# Patient Record
Sex: Female | Born: 1970 | Race: Black or African American | Hispanic: No | Marital: Married | Smoking: Never smoker
Health system: Southern US, Community
[De-identification: ages and names within clinical notes are randomized; demographics above are authoritative.]

## PROBLEM LIST (undated history)

## (undated) VITALS — BP 140/92 | HR 96 | Temp 97.1°F | Resp 16 | Ht <= 58 in | Wt 172.2 lb

## (undated) DIAGNOSIS — I10 Essential (primary) hypertension: Secondary | ICD-10-CM

## (undated) DIAGNOSIS — M722 Plantar fascial fibromatosis: Secondary | ICD-10-CM

## (undated) DIAGNOSIS — F319 Bipolar disorder, unspecified: Secondary | ICD-10-CM

## (undated) DIAGNOSIS — I509 Heart failure, unspecified: Secondary | ICD-10-CM

## (undated) DIAGNOSIS — M6282 Rhabdomyolysis: Secondary | ICD-10-CM

## (undated) DIAGNOSIS — E662 Morbid (severe) obesity with alveolar hypoventilation: Secondary | ICD-10-CM

## (undated) DIAGNOSIS — R918 Other nonspecific abnormal finding of lung field: Secondary | ICD-10-CM

## (undated) DIAGNOSIS — E559 Vitamin D deficiency, unspecified: Secondary | ICD-10-CM

## (undated) DIAGNOSIS — J189 Pneumonia, unspecified organism: Secondary | ICD-10-CM

## (undated) DIAGNOSIS — E785 Hyperlipidemia, unspecified: Secondary | ICD-10-CM

## (undated) DIAGNOSIS — J939 Pneumothorax, unspecified: Secondary | ICD-10-CM

## (undated) DIAGNOSIS — E119 Type 2 diabetes mellitus without complications: Secondary | ICD-10-CM

## (undated) DIAGNOSIS — D649 Anemia, unspecified: Secondary | ICD-10-CM

## (undated) HISTORY — DX: Pneumothorax, unspecified: J93.9

## (undated) HISTORY — PX: CYST EXCISION: SHX5701

## (undated) HISTORY — PX: VENTRICULOPERITONEAL SHUNT: SHX204

## (undated) HISTORY — DX: Morbid (severe) obesity with alveolar hypoventilation: E66.2

## (undated) HISTORY — DX: Heart failure, unspecified: I50.9

## (undated) HISTORY — DX: Other nonspecific abnormal finding of lung field: R91.8

---

## 2006-06-26 ENCOUNTER — Inpatient Hospital Stay (HOSPITAL_COMMUNITY): Admission: EM | Admit: 2006-06-26 | Discharge: 2006-07-06 | Payer: Self-pay | Admitting: Emergency Medicine

## 2007-02-13 ENCOUNTER — Emergency Department (HOSPITAL_COMMUNITY): Admission: EM | Admit: 2007-02-13 | Discharge: 2007-02-13 | Payer: Self-pay | Admitting: Emergency Medicine

## 2007-12-31 ENCOUNTER — Other Ambulatory Visit: Admission: RE | Admit: 2007-12-31 | Discharge: 2007-12-31 | Payer: Self-pay | Admitting: Internal Medicine

## 2009-04-23 ENCOUNTER — Encounter: Admission: RE | Admit: 2009-04-23 | Discharge: 2009-04-23 | Payer: Self-pay | Admitting: Chiropractic Medicine

## 2009-05-28 ENCOUNTER — Ambulatory Visit (HOSPITAL_COMMUNITY): Admission: RE | Admit: 2009-05-28 | Discharge: 2009-05-28 | Payer: Self-pay | Admitting: Chiropractic Medicine

## 2009-09-09 ENCOUNTER — Inpatient Hospital Stay (HOSPITAL_COMMUNITY): Admission: RE | Admit: 2009-09-09 | Discharge: 2009-09-10 | Payer: Self-pay | Admitting: Psychiatry

## 2009-09-09 ENCOUNTER — Emergency Department (HOSPITAL_COMMUNITY): Admission: EM | Admit: 2009-09-09 | Discharge: 2009-09-09 | Payer: Self-pay | Admitting: Family Medicine

## 2009-09-09 ENCOUNTER — Ambulatory Visit: Payer: Self-pay | Admitting: Psychiatry

## 2010-11-03 ENCOUNTER — Other Ambulatory Visit: Admission: RE | Admit: 2010-11-03 | Discharge: 2010-11-03 | Payer: Self-pay | Admitting: Endocrinology

## 2010-11-30 ENCOUNTER — Encounter
Admission: RE | Admit: 2010-11-30 | Discharge: 2010-11-30 | Payer: Self-pay | Source: Home / Self Care | Attending: Internal Medicine | Admitting: Internal Medicine

## 2011-03-24 LAB — COMPREHENSIVE METABOLIC PANEL
ALT: 16 U/L (ref 0–35)
Albumin: 4.1 g/dL (ref 3.5–5.2)
Alkaline Phosphatase: 88 U/L (ref 39–117)
BUN: 11 mg/dL (ref 6–23)
GFR calc Af Amer: >60 mL/min (ref >60)
Potassium: 3.9 mEq/L (ref 3.5–5.1)
Sodium: 130 mEq/L — ABNORMAL LOW (ref 135–145)
Total Protein: 8.2 g/dL (ref 6.0–8.3)

## 2011-03-24 LAB — PREGNANCY, URINE: Preg Test, Ur: NEGATIVE

## 2011-03-24 LAB — CBC
Platelets: 425 10*3/uL — ABNORMAL HIGH (ref 150–400)
RDW: 13.8 % (ref 11.5–15.5)

## 2011-03-24 LAB — URINE DRUGS OF ABUSE SCREEN W ALC, ROUTINE (REF LAB)
Amphetamine Screen, Ur: NEGATIVE
Cocaine Metabolites: NEGATIVE
Creatinine,U: 89.9 mg/dL
Ethyl Alcohol: <10 mg/dL (ref <10)
Phencyclidine (PCP): NEGATIVE

## 2011-03-24 LAB — URINALYSIS, ROUTINE W REFLEX MICROSCOPIC
Ketones, ur: 40 mg/dL — AB
Nitrite: NEGATIVE
Protein, ur: NEGATIVE mg/dL
Urobilinogen, UA: 0.2 mg/dL (ref 0.0–1.0)

## 2011-03-24 LAB — VALPROIC ACID LEVEL: Valproic Acid Lvl: 52.6 ug/mL (ref 50.0–100.0)

## 2011-05-05 NOTE — Consult Note (Signed)
[name], [name] NO.:  [id number]   MEDICAL RECORD NO.:  [id number]          PATIENT TYPE:  INP   LOCATION:  4731                         FACILITY:  [facility]   PHYSICIAN:  Antonietta Breach, M.D.  DATE OF BIRTH:  [birth year]   DATE OF CONSULTATION:  07/02/2006  DATE OF DISCHARGE:                                   CONSULTATION   REQUESTING PHYSICIAN:  Sherin Quarry, MD   REASON FOR CONSULTATION:  Hyperglycemia secondary to psychotropic.  Considering options for treatment.   HISTORY OF PRESENT ILLNESS:  [name] is a 40 year old married female who  was admitted to the [facility] on June 26, 2006.  She was  visiting her sister and is originally from [place].  She was brought into the  emergency room with lethargy and slurred speech.  This had been going on for  about 3 days and she had not been eating well.  She was having confusion and  her mood had been irritable.  She had been urinating a lot and drinking a  lot.  She had also complained of visual blurring and some dizziness.   The patient's hyperglycemia was discovered and has been stabilized.  Since  that time her mood is now stable with normal interests and hope.  She still  has a mild decrease in energy.  She has no thoughts of harming herself.  No  thoughts of harming others.  No delusion.  No hallucinations.  She has no  racing thoughts.  She is on a Abilify 15 mg p.o. night and Lamictal 200 mg  daily without adverse effects other than likely hyperglycemia as a factor  from the Abilify (there is a family history positive for diabetes mellitus).   PAST PSYCHIATRIC HISTORY:  [name] describes 4 day episodes of depression  over several years that would come approximately every 4 months.  They would  involve a mild depression of her energy as well as interest but no  impairment in her ability to take care of herself or perform work.  They did  at times involve a mild increase in sleep and a mild  decrease in appetite.  She does not have any history of suicidal thoughts or suicide attempts.  She  has no history of homicidal thoughts or violence.   The patient has never been tried on any antidepressants.  She did begin a  new job in February 2007 and in May 2007 presented to her medical  professional with a several day history of racing thoughts, pressured  speech, euphoria, decreased need for sleep and ideas of reference from the  TV.  She believed that she was connected to the actors on TV.  She also  believed that she was going to be famous and influential.  The patient was  started on Lamictal.  She returned approximately 6 weeks later and Abilify  was added.  The patient did not require psychiatric admission.  The patient  can also not recall which symptoms continued to be present at the time that  Abilify was added.  However, after Abilify was added  to the regimen she has  continued with the presentation as described after her glucose control in  the history of present illness above.   FAMILY PSYCHIATRIC HISTORY:  None known.   SOCIAL HISTORY:  [name] is from [place].  She is visiting a sister in  [place].  She has 2 children ages 64 and 35.  She has a supportive  marriage.  Her religion is [place].  She states that her church is also  supportive.  She is not working at this time.  She does not use alcohol or  illegal drugs.   GENERAL MEDICAL PROBLEMS:  The patient does have a history of obstructive  sleep apnea and hypertension.   MEDICATIONS:  The patient MAR is reviewed.  Psychotropic's include:  1.  Abilify 15 mg nightly.  2.  Lamictal 200 mg q. day.   ALLERGIES:  There are no known drug allergies.   LABORATORY DATA:  CBC is unremarkable except for slightly decreased  hemoglobin 11.3.  The metabolic panel is remarkable for a glucose at 167  (decreased from 313).  BUN and creatinine are within normal limits.  The  SGOT on the July 10 was 86.  The SGPT 40.    REVIEW OF SYSTEMS:  CONSTITUTIONAL:  Afebrile.  HEAD:  No trauma.  EYES:  No  visual changes.  EARS:  No hearing impairment.  NOSE, throat:  No sore  throat.  NEUROLOGIC:  Unremarkable.  CARDIOVASCULAR:  No chest pain,  palpitations or edema.  RESPIRATORY:  No shortness of breath, coughing or  wheezing.  GASTROINTESTINAL:  No nausea, vomiting or diarrhea.  Symptoms  have resolved.  GENITOURINARY:  No dysuria.  SKIN:  Unremarkable.  MUSCULOSKELETAL:  No deformities, weakness or atrophies.  PSYCHIATRIC:  As  above.  ENDOCRINE/METABOLIC:  As above.   PHYSICAL EXAMINATION:  VITAL SIGNS:  Temperature 97.8, pulse 76,  respirations 18, blood pressure 144/81, O2 saturation on room air 98%.  Current CBG is 313.   MENTAL STATUS EXAM:  [name] is a middle aged female who looks  younger than her stated age who is resting in supine partially reclined  position in her hospital bed with good eye contact and pleasant disposition.  Her fund of knowledge and intelligence are greater than average.  Thought  process:  Logical, coherent and goal directed.  No lucency of associations,  thought content.  No thoughts of harming herself.  No thoughts of harming  others.  No delusions.  No hallucinations.  Memory is intact to immediate,  recent and remote.  Concentration is within normal limits.  Affect is  slightly constricted at baseline with a broad appropriate response.  Insight  is good.  Judgment is intact.  She is oriented completely to all spheres.  Her mood is content.  Her abstraction ability is intact.   ASSESSMENT:  AXIS I:  1.  Mood disorder not otherwise specified; 293.83 (history of functional      bipolar I disorder with recent general medical contribution).  2.  Bipolar I disorder, manic, in remission.  3.  Delirium secondary to hyperglycemic episode in remission.  AXIS II:  None.  AXIS III:  See general medical problem.  AXIS IV:  General medical. AXIS V:  60.   The patient is  not at risk to harm herself or others.  The patient agrees to  call emergency services for thoughts of harming herself, thoughts of harming  others, distress, racing thoughts or unusual perceptions.  INFORMED CONSENT:  The indications, alternatives and adverse affects of the  following were discussed with the patient.  Abilify for anti-psychosis and  mood stabilization including the risk of hyperglycemia, diabetes, Parkinson  side effects, as well as a nonreversible movement; Lamictal as primary mood  stabilizer.  The patient understands above information and wants to continue  the Abilify.  She understands that it is typical for Abilify to undergo a  reduction and a reduction trial would be best undertaken when the patient  has return to her normal outpatient lifestyle.  This reduction will take  place while being monitored carefully by her outpatient psychiatrist.  Obviously any reduction trial would be the patient's choice and would not be  pursued at this time.   The patient goal for all patients with bipolar I disorder that they be  maintained on only a mood stabilizer such as Lamictal.  However, usually no  more than 1/3 are able to achieve a single psychotropic maintenance regimen.   RECOMMENDATIONS:  1.  Would continue Abilify for anti-psychosis/mania without change at this      time at 15 mg p.o. q. day.  2.  Would continue Lamictal at 200 mg q. day as the patient's primary mood      stabilizer with continued recommended caution regarding any rash that      develops.  3.  Please ask the case manager to set up a psychiatric appointment within      the first week of discharge.  The patient desires to follow up with her      outpatient psychiatrist in [place] and she has the information for this      followup contact.      Antonietta Breach, M.D.  Electronically Signed     JW/MEDQ  D:  07/02/2006  T:  07/03/2006  Job:  [id number]

## 2011-05-05 NOTE — Discharge Summary (Signed)
[name], [name]   MEDICAL RECORD NO.:  [id number]          PATIENT TYPE:  INP   LOCATION:  4731                         FACILITY:  [facility]   PHYSICIAN:  [name], M.D.DATE OF BIRTH:  03-30-1971   DATE OF ADMISSION:  06/26/2006  DATE OF DISCHARGE:  07/01/2006                                 DISCHARGE SUMMARY   DIAGNOSIS:  1.  Severe hypernatremia, resolving.  2.  Nephrogenic diabetes insipidus causing number 1, felt to be secondary to      previous lithium use.  3.  Uncontrolled diabetes mellitus, type 2 with nonketotic hyperosmolar      hyperglycemia.  4.  Bipolar disorder.  5.  Presumed early sepsis secondary to severe constipation.  6.  Acute renal failure.  7.  Secondary to severe hyperglycemia, dehydration and rhabdomyolysis.  8.  Rhabdomyolysis.  9.  Also presents with diagnosis of sleep apnea.   HOSPITAL COURSE:  Patient is a 40 year old [place] American female who is  visiting from [place], staying with family, looking into relocate down here who  presented lethargic, unresponsive, brought in by her sister.  She presents  with a history of bipolar disorder, sleep apnea and hypertension.  As we  learned later on, she had a history of gestational diabetes, but had not  followed up to determine if the diabetes had resolved.  For the past few  days she had been lethargic with minimal p.o. intake.  She had been drinking  a lot of water and having polyuria. She also complained of some dizziness,  as well as looked to have some vomiting.  In the emergency room the patient  was found to have a BP of 102/64, heart rate of 120, normal O2 sat, but a pH  of 7.26, a white count of 28, a BUN of 82, creatinine of 4.1, total  bilirubin of 2.2, sodium of 151, and glucose of 1300 and her urine showed a  large amount of blood, 100 protein, but only 2 to 6 red cells.  Patient was  admitted as a newly diagnosed diabetic, thought to be likely  hyperglycemia  or type 2.  She was initially started on IV fluids, but no clear source of  infection as her urine and chest x-ray showed to be fine.  It is possible  the above this could be a stress related.  She received a dose of Rocephin  and was planned to put her in the step-down unit.  Several hours later in  followup patient's white count had increased to 31.3.  She had a 20% anemia  at this point.  Her glucose as it had come down into 100 from 1300.  She had  a mild anion gap that had quickly resolved.  Concerned was her sodium which  had increased from 151 to 162 to 169.  In addition, the patient's renal  failure had greatly Improved from down to 54 with a creatinine of 2.  It was  felt that likely patient had a worsening sign of infection that the source  was not being adequately treated, however, her  sodium was felt to likely  actually be improved given that she likely had a pseudo-hyponatremia brought  on by elevated blood sugars and that her initial reading was 151 after  correction it was much likely closer to 170.  Patient was aggressively  hydrated with large boluses of IV fluid at 1/2 normal saline.  Nephrology  was consulted.  Patient had blood cultures drawn. Abdominal x-ray was  obtained as well.  She showed signs of abdominal distention and she was  started on IV Zosyn and Vancomycin.  By the time the patient was in the Step-  down unit, her sodium did not change beyond 169.  Her white count had  greatly dropped on to 15.  She remained to be stable.  Discussed with Dr.  Hyman Hopes, nephrology and given her bipolar disorder, she likely had been on  lithium in the past which may have caused her some nephrogenic diabetes  insipidus.  He recommended the patient go over to D5W.  Hospital course in  regard to patient's hypernitremia and nephrogenic diabetes insipidus the  patient responded well to the D5W.  This was able to be slowly advanced.  She was then able to tolerate p.o.  and started taking in free water.  Her  fluids were changed over to 1/2 normal saline and currently as of 07/01/2006  she is on 1/2 normal saline at 200 cc an hour.  Her sodium is down to 142.  We will working on weaning down her IV fluid and discussed with Dr. Hyman Hopes as  for long term treatment.  It is likely felt that she had diuresis from  lithium and had a significant water deficit leading to hypernatremia.  It is  hoped that with correction the deficit issues that this should soon be  resolved.  In regards to patient's uncontrolled diabetes.  When patient was  more alert, she was able to give a previous history of gestational diabetes  and likely this had persisted post partum without a followup.  Patient had  no signs of any DKA.  Initially her sugars after coming down on insulin drip  started to elevate once the __________ had discontinued with her receiving  D5W.  The systems was weaned off.  She was able to start tolerating and she  was put on p.o. diet.  She was tolerating this well.  She was started on  Lantus and currently is receiving Lantus 20 units subcutaneous b.i.d. with  the insulin sliding scale.  Her sugars have shown some significant  improvement in the interim with sugars over the last 24 hours ranging  anywhere from 120s to the 270s.  We will, over the next day be able to  better adjust her dose.  We will also put her in for diabetes education.  The difficultly will be that the patient has both bipolar disorder and was  out of town.   Next, in regard to patient's infection source could not be found in terms of  her urine or chest x-ray.  Was felt likely that perhaps given her increased  lethargy and dehydration she had severe constipation and had stool in her  gut for sometime. Given no other source, I do feel that she truly did have  an infection with early signs of sepsis given her jump in her white count from 28 up to 31 with bandemia and she had bacteria  translocation of the gut  secondary to constipation.  Patient's antibiotics were changed over from  dosing  of Vancomycin to Cipro and Flagyl both by p.o. Patient has tolerated  this well.  She has had no further fevers and in fact, at this time, her  white count remains stable.  I will plan at this time, given that we have  had no positive blood cultures to receive a total of 7 days of antibiotics  and she can discontinue her Cipro and Flagyl after 07/16 for a total of 7  days.   In regards to patient's bipolar disorder, initially I was concerned about  whether the patient's Abilify and Lamictal were causing some of her medical  issues.  There are reports in the literature of Abilify side effects  including diabetes, hyperglycemia, and intestinal obstruction, but given her  previous history of gestational diabetes, I would not relate her diabetes  right now to hyperglycemia.  At this point, I resumed her medications and  she appears to be relatively stable.  What is of interesting is that patient  continues to complain of active hallucinations.  She realized that these are  hallucinations, but she says that they are ongoing.  She says this is not an  acute process, but has been doing on despite her medications.  I have asked  Dr. Andrey Campanile from psychiatry to see the patient and he will see her on Monday  07/16 to make any additional medication recommendations.   In regards to patient's sleep apnea, she continued to have problems with  this and her CPAP was continued more in the hospital.  She is noted to have  episodes of apnea and even pauses on her telemetry when not on CPAP when  asleep and so the CPAP is certainly vital.   In regards to patient's acute renal failure, following aggressive hydration,  as Dr. Hyman Hopes was correcting her severe volume depletion both brought on by  diabetes insipidus, she was in need of significant fluid correction.  Her  BUN and creatinine have normalized and  in regards to her rhabdo which was  likely secondarily brought on by weakness and immobility for several days  prior to admission.  This has since resolved following a great redirection.   Obesity:  Plan will be at this point, I am waiting for psychiatry's  recommendations, as well as diabetes and I am recommending psychiatry  evaluation, and further medications, as well as turning down of patient's IV  fluids and insuring her sodium stays well and diabetes education and then we  will plan to discharge the patient in the next several days.  She is quiet,  alert, and oriented and very pleasant at this point, for someone who is  critically ill, has certainly recovered well.   Patient's overall disposition from her initial admission is significantly  improved.  Her discharge diet will be a modified, low sodium diet.  Activity  will be as tolerated, although we will get physical therapy to work with her prior to discharge and then she will be discharged to home.      [name], M.D.  Electronically Signed     SKK/MEDQ  D:  07/01/2006  T:  07/01/2006  Job:  [id number]

## 2011-05-05 NOTE — H&P (Signed)
[name], ENRIQUES NO.:  [id number]   MEDICAL RECORD NO.:  [id number]          PATIENT TYPE:  INP   LOCATION:  1832                         FACILITY:  [facility]   PHYSICIAN:  Jackie Plum, M.D.DATE OF BIRTH:  01-27-1971   DATE OF ADMISSION:  06/26/2006  DATE OF DISCHARGE:                                HISTORY & PHYSICAL   HISTORY OF PRESENT ILLNESS:  The patient is unable to give history.  She is  lethargic.  History is given by her sister at the bedside.  She is a 40-year-  old African-American lady with history of sleep apnea, bipolar disorder,  hypertension.  She is visiting from [place].  She was brought in here to the ED  on account of lethargy and slurred speech.  She has been lethargic for about  three days and has not been eating well.  She is also having episodes of  irritation and confusion.  She has been having polyuria and drinking a lot  of water according to the sister.  Apparently before this, the patient had  complained of some dizziness as well as visual blurring.  Has been nauseous  with episodes of vomiting.  She is also said to have complained of some  shortness of breath and palpitations.  In the emergency room the patient was  noted to have a sugar of 1307 mg/dl.  IV fluids as well as IV insulin was  instituted and the patient was admitted for further evaluation and treatment  by the hospitalist service.   PAST MEDICAL HISTORY:  As listed above.   MEDICATIONS:  1.  Abilify 15 mg daily.  2.  Lisinopril 10 mg daily.  3.  Lamictal 200 mg daily.   FAMILY HISTORY:  Positive for diabetes mellitus.   SOCIAL HISTORY:  The patient does not smoke cigarettes or drink alcohol.   REVIEW OF SYSTEMS:  As listed above; otherwise not consistently obtainable  because of the patient's situation.   PHYSICAL EXAMINATION:  VITAL SIGNS:  BP 102/64.  Temperature 98.3.  Heart  rate 120 but had come down to 102 per minute according to the monitor in the  ED at the time of my evaluation.  O2 sat of 95% on room air.  GENERAL:  The patient does not appear in pulmonary distress.  HEENT:  Normocephalic, atraumatic.  Pupils were equal, round, reactive to  light.  Extraocular muscles intact.  Oropharynx was very dry.  NECK:  Supple.  No JVD.  LUNGS:  Breath sounds were diminished at the bases.  I could not appreciate  any crackles or wheezes.  CARDIAC:  The patient was tachycardic.  Seemed to be regular.  No gallops.  No murmur appreciated.  EXTREMITIES:  No cyanosis or edema.  ABDOMEN:  Obese.  Bowel sounds noted to be a little bit down.  CNS:  The patient was lethargic.  She moved all extremities and she was  easily arousable.   LABORATORY DATA:  Her pH is 7.26, pCO2 44.7, pO2 46, bicarb 20.1.  Oxygen  saturation 95%.  This was said to be at __________, but probably __________.  WBC count of 28.2, hemoglobin 16.4, hematocrit 23.5,  MCV 98, platelet count  346.  Sodium 151, potassium 5.6, chloride 104, CO2 19, glucose 1307, BUN 82,  creatinine 4.1, bilirubin 2.2, alk phos 126, AST 86, ALT 40, total protein  8.8, albumin 4.1, calcium 10.1.  Urinalysis color yellow, appearance cloudy,  specific gravity 1.029.  pH 5.  Urine glucose more than 1000.  Moderate  bilirubin and ketones 15.  Large blood. Protein 100, urobilinogen 0.2,  nitrite negative.  Leukocyte negative.  Miniscule epithelial cells, 0-2  wbc's per high-power field, 2-6 rbc's per high-power field.  A few bacteria.   IMPRESSION:  1.  Hyperosmolar, hyperglycemic state.  Newly diagnosed diabetic.  2.  Acute renal failure secondary to severe dehydration.  3.  Hypernatremia secondary to dehydration.  4.  History of hypertension.  5.  Leukocytosis.   PLAN:  The patient has already received 2 L of IV normal saline.  Will start  her on half normal saline and monitor her electrolytes as well as IV  insulin.  Will recommend a protocol with monitoring of electrolytes  especially potassium  and sodium.  The patient has significant leukocytosis.  There is no clear source of infection.  She has not had any fever here.  However, we will go ahead and give her a dose of Rocephin and get x-ray of  the chest with cultures of blood and urine.  Repeat leukocyte count in the  morning.  Her mental change is due to her metabolic problems mentioned  above.  Work up:  Hemoglobin A1c, fasting lipid panel  for completeness  sake. Will hold her lisinopril at this time with acute renal failure and  monitor her renal function.  Would order renal ultrasound and possibly get a  nephrologic consultation, depend on how she progresses.      Jackie Plum, M.D.  Electronically Signed     GO/MEDQ  D:  06/27/2006  T:  06/27/2006  Job:  [id number]

## 2013-03-16 ENCOUNTER — Inpatient Hospital Stay (HOSPITAL_COMMUNITY)
Admission: AD | Admit: 2013-03-16 | Discharge: 2013-03-21 | DRG: 885 | Disposition: A | Discharge status: Home / Self Care | Payer: PRIVATE HEALTH INSURANCE | Attending: Psychiatry | Admitting: Psychiatry

## 2013-03-16 ENCOUNTER — Encounter (HOSPITAL_COMMUNITY): Payer: Self-pay | Admitting: Emergency Medicine

## 2013-03-16 ENCOUNTER — Emergency Department (HOSPITAL_COMMUNITY)
Admission: EM | Admit: 2013-03-16 | Discharge: 2013-03-16 | Disposition: A | Discharge status: Home / Self Care | Payer: 59 | Attending: Emergency Medicine | Admitting: Emergency Medicine

## 2013-03-16 DIAGNOSIS — F3110 Bipolar disorder, current episode manic without psychotic features, unspecified: Principal | ICD-10-CM | POA: Diagnosis present

## 2013-03-16 DIAGNOSIS — Z79899 Other long term (current) drug therapy: Secondary | ICD-10-CM | POA: Insufficient documentation

## 2013-03-16 DIAGNOSIS — I10 Essential (primary) hypertension: Secondary | ICD-10-CM | POA: Insufficient documentation

## 2013-03-16 DIAGNOSIS — E119 Type 2 diabetes mellitus without complications: Secondary | ICD-10-CM | POA: Insufficient documentation

## 2013-03-16 DIAGNOSIS — E7800 Pure hypercholesterolemia, unspecified: Secondary | ICD-10-CM | POA: Diagnosis present

## 2013-03-16 DIAGNOSIS — F39 Unspecified mood [affective] disorder: Secondary | ICD-10-CM | POA: Insufficient documentation

## 2013-03-16 DIAGNOSIS — Z3202 Encounter for pregnancy test, result negative: Secondary | ICD-10-CM | POA: Insufficient documentation

## 2013-03-16 DIAGNOSIS — Z7982 Long term (current) use of aspirin: Secondary | ICD-10-CM | POA: Insufficient documentation

## 2013-03-16 HISTORY — DX: Type 2 diabetes mellitus without complications: E11.9

## 2013-03-16 HISTORY — DX: Essential (primary) hypertension: I10

## 2013-03-16 LAB — RAPID URINE DRUG SCREEN, HOSP PERFORMED
Amphetamines: NOT DETECTED
Barbiturates: NOT DETECTED
Tetrahydrocannabinol: NOT DETECTED

## 2013-03-16 LAB — CBC
MCV: 72.1 fL — ABNORMAL LOW (ref 78.0–100.0)
Platelets: 528 10*3/uL — ABNORMAL HIGH (ref 150–400)
RBC: 5.02 MIL/uL (ref 3.87–5.11)
WBC: 11 10*3/uL — ABNORMAL HIGH (ref 4.0–10.5)

## 2013-03-16 LAB — COMPREHENSIVE METABOLIC PANEL
ALT: 19 U/L (ref 0–35)
AST: 41 U/L — ABNORMAL HIGH (ref 0–37)
Alkaline Phosphatase: 97 U/L (ref 39–117)
CO2: 29 mEq/L (ref 19–32)
Chloride: 98 mEq/L (ref 96–112)
Creatinine, Ser: 1.09 mg/dL (ref 0.50–1.10)
GFR calc non Af Amer: 61 mL/min — ABNORMAL LOW (ref >90)
Total Bilirubin: 0.3 mg/dL (ref 0.3–1.2)

## 2013-03-16 LAB — SALICYLATE LEVEL: Salicylate Lvl: <2 mg/dL — ABNORMAL LOW (ref 2.8–20.0)

## 2013-03-16 MED ORDER — MAGNESIUM HYDROXIDE 400 MG/5ML PO SUSP
30.0000 mL | Freq: Every day | ORAL | Status: DC | PRN
  Administered 2013-03-18: 30 mL via ORAL

## 2013-03-16 MED ORDER — LORAZEPAM 1 MG PO TABS: 1.0000 mg | ORAL_TABLET | Freq: Three times a day (TID) | ORAL | Status: DC | PRN

## 2013-03-16 MED ORDER — TRAZODONE HCL 50 MG PO TABS
50.0000 mg | ORAL_TABLET | Freq: Every evening | ORAL | Status: DC | PRN
  Administered 2013-03-20: 50 mg via ORAL
  Filled 2013-03-16: qty 8
  Filled 2013-03-16: qty 1

## 2013-03-16 MED ORDER — METFORMIN HCL 500 MG PO TABS
1000.0000 mg | ORAL_TABLET | Freq: Every day | ORAL | Status: DC
  Administered 2013-03-17 – 2013-03-21 (×5): 1000 mg via ORAL
  Filled 2013-03-16: qty 8
  Filled 2013-03-16 (×3): qty 2
  Filled 2013-03-16: qty 1
  Filled 2013-03-16 (×3): qty 2
  Filled 2013-03-16: qty 8

## 2013-03-16 MED ORDER — ALUM & MAG HYDROXIDE-SIMETH 200-200-20 MG/5ML PO SUSP: 30.0000 mL | ORAL | Status: DC | PRN

## 2013-03-16 MED ORDER — ACETAMINOPHEN 325 MG PO TABS: 650.0000 mg | ORAL_TABLET | Freq: Four times a day (QID) | ORAL | Status: DC | PRN

## 2013-03-16 MED ORDER — ACETAMINOPHEN 325 MG PO TABS: 650.0000 mg | ORAL_TABLET | ORAL | Status: DC | PRN

## 2013-03-16 MED ORDER — DIVALPROEX SODIUM ER 500 MG PO TB24
1000.0000 mg | ORAL_TABLET | Freq: Every day | ORAL | Status: DC
  Administered 2013-03-17 – 2013-03-19 (×3): 1000 mg via ORAL
  Filled 2013-03-16: qty 1
  Filled 2013-03-16 (×4): qty 2

## 2013-03-16 MED ORDER — SIMVASTATIN 40 MG PO TABS
40.0000 mg | ORAL_TABLET | Freq: Every day | ORAL | Status: DC
  Administered 2013-03-17 – 2013-03-20 (×4): 40 mg via ORAL
  Filled 2013-03-16 (×7): qty 1

## 2013-03-16 MED ORDER — ASENAPINE MALEATE 5 MG SL SUBL
10.0000 mg | SUBLINGUAL_TABLET | Freq: Every day | SUBLINGUAL | Status: DC
  Administered 2013-03-17 – 2013-03-20 (×5): 10 mg via SUBLINGUAL
  Filled 2013-03-16 (×3): qty 2
  Filled 2013-03-16 (×2): qty 4
  Filled 2013-03-16 (×3): qty 2

## 2013-03-16 MED ORDER — ZOLPIDEM TARTRATE 5 MG PO TABS: 5.0000 mg | ORAL_TABLET | Freq: Every evening | ORAL | Status: DC | PRN

## 2013-03-16 MED ORDER — ASPIRIN EC 81 MG PO TBEC
81.0000 mg | DELAYED_RELEASE_TABLET | Freq: Every day | ORAL | Status: DC
  Administered 2013-03-17 – 2013-03-21 (×5): 81 mg via ORAL
  Filled 2013-03-16 (×8): qty 1

## 2013-03-16 MED ORDER — INSULIN ASPART 100 UNIT/ML ~~LOC~~ SOLN: 0.0000 [IU] | Freq: Three times a day (TID) | SUBCUTANEOUS | Status: DC

## 2013-03-16 MED ORDER — LAMOTRIGINE 100 MG PO TABS
100.0000 mg | ORAL_TABLET | Freq: Every day | ORAL | Status: DC
  Administered 2013-03-17: 100 mg via ORAL
  Filled 2013-03-16 (×3): qty 1

## 2013-03-16 MED ORDER — IBUPROFEN 200 MG PO TABS: 600.0000 mg | ORAL_TABLET | Freq: Three times a day (TID) | ORAL | Status: DC | PRN

## 2013-03-16 MED ORDER — ONDANSETRON HCL 4 MG PO TABS: 4.0000 mg | ORAL_TABLET | Freq: Three times a day (TID) | ORAL | Status: DC | PRN

## 2013-03-16 NOTE — [facility] Counselor (Signed)
Pt signed voluntary consent and consent to release info. Writer faxed forms to [facility] and put originals in pt's chart.  Evette Cristal, [place] Assessment Counselor

## 2013-03-16 NOTE — Progress Notes (Signed)
Patient has been accepted at [facility] by Jorje Guild to the services of Dr. Daleen Bo pending medical clearance.

## 2013-03-16 NOTE — ED Provider Notes (Signed)
History     CSN: [id number]  Arrival date & time 03/16/13  2014   First MD Initiated Contact with Patient 03/16/13 2104      Chief Complaint  Patient presents with  . Medical Clearance    (Consider location/radiation/quality/duration/timing/severity/associated sxs/prior treatment) The history is provided by the patient.   42 year old female with history of bipolar disorder was sent here from [facility] for medical clearance. She states she has not been taking her medication for at least the last 3 months. Over the last week, family has noted that her speech has changed and she is talking incoherently. Amount of sleep has decreased significantly. She states that she's feeling paranoid base to what people are saying on the television. She denies homicidal or suicidal thoughts and denies hallucinations. There've been no shopping sprees or hypersexuality. She denies drug use.  No past medical history on file.  No past surgical history on file.  No family history on file.  History  Substance Use Topics  . Smoking status: Not on file  . Smokeless tobacco: Not on file  . Alcohol Use: Not on file    OB History   No data available      Review of Systems  Unable to perform ROS: Psychiatric disorder    Allergies  Review of patient's allergies indicates no known allergies.  Home Medications   Current Outpatient Rx  Name  Route  Sig  Dispense  Refill  . Asenapine Maleate (SAPHRIS) 10 MG SUBL   Sublingual   Place 1 tablet under the tongue daily.         Marland Kitchen aspirin 81 MG tablet   Oral   Take 81 mg by mouth daily.         . divalproex (DEPAKOTE ER) 500 MG 24 hr tablet   Oral   Take 1,000 mg by mouth daily.         Marland Kitchen lamoTRIgine (LAMICTAL) 200 MG tablet   Oral   Take 200 mg by mouth daily.         . metFORMIN (GLUCOPHAGE) 1000 MG tablet   Oral   Take 1,000 mg by mouth daily with breakfast.         . Methylsulfonylmethane (MSM) 1000  MG CAPS   Oral   Take 1 capsule by mouth daily.         . pravastatin (PRAVACHOL) 80 MG tablet   Oral   Take 80 mg by mouth daily.         . vitamin B-12 (CYANOCOBALAMIN) 1000 MCG tablet   Oral   Take 1,000 mcg by mouth daily.           BP 145/100  Pulse 93  Temp(Src) 98 F (36.7 C) (Oral)  Resp 18  SpO2 96%  Physical Exam  Nursing note and vitals reviewed.  42 year old female, resting comfortably and in no acute distress. Vital signs are significant for mild hypertension with blood pressure 100/45 or 100. Oxygen saturation is 96%, which is normal. Head is normocephalic and atraumatic. PERRLA, EOMI. Oropharynx is clear. Neck is nontender and supple without adenopathy or JVD. Back is nontender and there is no CVA tenderness. Lungs are clear without rales, wheezes, or rhonchi. Chest is nontender. Heart has regular rate and rhythm without murmur. Abdomen is soft, flat, nontender without masses or hepatosplenomegaly and peristalsis is normoactive. Extremities have no cyanosis or edema, full range of motion is present. Skin is warm and dry without  rash. Neurologic: She is awake, alert, oriented, cranial nerves are intact, there are no motor or sensory deficits. Psychiatric: She is somewhat animated and speech demonstrates flight of ideas.  ED Course  Procedures (including critical care time)  Results for orders placed during the hospital encounter of 03/16/13  ACETAMINOPHEN LEVEL      Result Value Range   Acetaminophen (Tylenol), Serum <15.0  10 - 30 ug/mL  CBC      Result Value Range   WBC 11.0 (*) 4.0 - 10.5 K/uL   RBC 5.02  3.87 - 5.11 MIL/uL   Hemoglobin 10.2 (*) 12.0 - 15.0 g/dL   HCT 04.5  40.9 - 81.1 %   MCV 72.1 (*) 78.0 - 100.0 fL   MCH 20.3 (*) 26.0 - 34.0 pg   MCHC 28.2 (*) 30.0 - 36.0 g/dL   RDW 91.4 (*) 78.2 - 95.6 %   Platelets 528 (*) 150 - 400 K/uL  COMPREHENSIVE METABOLIC PANEL      Result Value Range   Sodium 136  135 - 145 mEq/L    Potassium 3.7  3.5 - 5.1 mEq/L   Chloride 98  96 - 112 mEq/L   CO2 29  19 - 32 mEq/L   Glucose, Bld 97  70 - 99 mg/dL   BUN 13  6 - 23 mg/dL   Creatinine, Ser 2.13  0.50 - 1.10 mg/dL   Calcium 9.2  8.4 - 08.6 mg/dL   Total Protein 7.7  6.0 - 8.3 g/dL   Albumin 3.8  3.5 - 5.2 g/dL   AST 41 (*) 0 - 37 U/L   ALT 19  0 - 35 U/L   Alkaline Phosphatase 97  39 - 117 U/L   Total Bilirubin 0.3  0.3 - 1.2 mg/dL   GFR calc non Af Amer 61 (*) >90 mL/min   GFR calc Af Amer 71 (*) >90 mL/min  ETHANOL      Result Value Range   Alcohol, Ethyl (B) <11  0 - 11 mg/dL  SALICYLATE LEVEL      Result Value Range   Salicylate Lvl <2.0 (*) 2.8 - 20.0 mg/dL  URINE RAPID DRUG SCREEN (HOSP PERFORMED)      Result Value Range   Opiates NONE DETECTED  NONE DETECTED   Cocaine NONE DETECTED  NONE DETECTED   Benzodiazepines NONE DETECTED  NONE DETECTED   Amphetamines NONE DETECTED  NONE DETECTED   Tetrahydrocannabinol NONE DETECTED  NONE DETECTED   Barbiturates NONE DETECTED  NONE DETECTED    1. Bipolar disease, manic       MDM  Manic phase of bipolar disorder. I reviewed her evaluation at [facility] where they do feel that she needs inpatient care. She will be transferred back to her once results of medical screening labs have been obtained.  Screening labs are unremarkable. Patient is considered medically cleared to be transferred to psychiatric facility for inpatient psychiatric care. Patient has been accepted at [facility] by Dr. Daleen Bo.   Dione Booze, MD 03/16/13 2255

## 2013-03-16 NOTE — BH Assessment (Signed)
Assessment Note   [name] is an 42 y.o. female. Patient has been referred by her current psychiatrist ( Dr. Evelene Croon) for inpatient treatment for medication adjustment.  Patient stopped medications 6 months ago. She states that she stopped because she has a sister who is also Bipolar that was taken off her medications and told that she no longer needs them so felt that she could stop hers as well. Patient presents hyper verbal, mildly anxious and with flight of ideas.  Patient's husband states that patient has only been sleeping 2-3 hours for the last couple of days and that for the past week her thoughts have been incoherent and all over the place and that she has been talking non stop. Patient's husband states that he spoke with Dr. Evelene Croon before they presented tonight and that she recommended that the patient come inpatient for medication adjustment and mood stabilization.  Axis I: Bipolar, Manic Axis II: Deferred Axis III: No past medical history on file. Axis IV: Stopped medications Axis V: 35  Past Medical History: No past medical history on file.  No past surgical history on file.  Family History: No family history on file.  Social History:  has no tobacco, alcohol, and drug history on file.  Additional Social History:  Alcohol / Drug Use History of alcohol / drug use?: No history of alcohol / drug abuse  CIWA:   COWS:    Allergies: Allergies not on file  Home Medications:  (Not in a hospital admission)  OB/GYN Status:  No LMP recorded.  General Assessment Data Location of Assessment: [facility] Services Living Arrangements: Spouse/significant other;Children (10 & 34 yo daughters) Can pt return to current living arrangement?: Yes Admission Status: Voluntary Is patient capable of signing voluntary admission?: Yes Transfer from: Home Referral Source: Psychiatrist  Education Status Is patient currently in school?: No Contact person:  (Spouse)  Risk to  self Suicidal Ideation: No Suicidal Intent: No Is patient at risk for suicide?: No Suicidal Plan?: No Access to Means: No What has been your use of drugs/alcohol within the last 12 months?:  (Denies) Previous Attempts/Gestures: No How many times?:  (None reported) Other Self Harm Risks:  (None reported) Triggers for Past Attempts: None known Intentional Self Injurious Behavior: None Family Suicide History: No (Sister Bipolar) Recent stressful life event(s): Other (Comment) (Stopped medications) Persecutory voices/beliefs?: No Depression: No Depression Symptoms:  (None reported) Substance abuse history and/or treatment for substance abuse?: No Suicide prevention information given to non-admitted patients: Not applicable  Risk to Others Homicidal Ideation: No Thoughts of Harm to Others: No Current Homicidal Intent: No Current Homicidal Plan: No Access to Homicidal Means: No Identified Victim:  (Na) History of harm to others?: No Assessment of Violence: None Noted Violent Behavior Description:  (Na) Does patient have access to weapons?: No Criminal Charges Pending?: No Does patient have a court date: No  Psychosis Hallucinations: None noted Delusions: None noted  Mental Status Report Appear/Hygiene:  (WNL) Eye Contact: Good Motor Activity: Hyperactivity Speech: Rapid;Logical/coherent (hyperverbal) Level of Consciousness: Alert Mood: Anxious Affect: Appropriate to circumstance;Anxious Anxiety Level: Minimal Thought Processes: Flight of Ideas;Tangential Judgement: Impaired Orientation: Person;Place;Time;Situation Obsessive Compulsive Thoughts/Behaviors: Minimal  Cognitive Functioning Concentration: Decreased Memory: Recent Intact;Remote Intact IQ: Average Insight: Fair Impulse Control: Fair Appetite: Good Weight Loss:  (None noted) Weight Gain:  (None noted) Sleep: Decreased Total Hours of Sleep:  (2-3 hours/ night) Vegetative Symptoms: None  ADLScreening  [facility] Assessment Services) Patient's cognitive ability adequate to safely complete daily activities?:  Yes Patient able to express need for assistance with ADLs?: Yes Independently performs ADLs?: Yes (appropriate for developmental age)  Abuse/Neglect [facility]) Physical Abuse: Denies Verbal Abuse: Denies Sexual Abuse: Denies  Prior Inpatient Therapy Prior Inpatient Therapy: Yes Prior Therapy Dates:  (2010) Prior Therapy Facilty/Provider(s):  [facility]) Reason for Treatment:  (Manic)  Prior Outpatient Therapy Prior Outpatient Therapy: Yes Prior Therapy Dates:  (Current) Prior Therapy Facilty/Provider(s): Dr. Evelene Croon Reason for Treatment:  (Bipolar d/o ; medication management)  ADL Screening (condition at time of admission) Patient's cognitive ability adequate to safely complete daily activities?: Yes Patient able to express need for assistance with ADLs?: Yes Independently performs ADLs?: Yes (appropriate for developmental age) Weakness of Legs: None Weakness of Arms/Hands: None       Abuse/Neglect Assessment (Assessment to be complete while patient is alone) Physical Abuse: Denies Verbal Abuse: Denies Sexual Abuse: Denies Exploitation of patient/patient's resources: Denies Self-Neglect: Denies     Merchant navy officer (For Healthcare) Advance Directive: Patient does not have advance directive;Patient would like information    Additional Information 1:1 In Past 12 Months?: No CIRT Risk: No Elopement Risk: No Does patient have medical clearance?: No     Disposition:  Disposition Initial Assessment Completed for this Encounter: Yes Disposition of Patient: Inpatient treatment program Type of inpatient treatment program: Adult  On Site Evaluation by:   Reviewed with Physician:  Jorje Guild PA   Ardelia Mems M 03/16/2013 8:27 PM

## 2013-03-16 NOTE — ED Notes (Signed)
Report called to [facility] they will contact MD and return call

## 2013-03-16 NOTE — ED Notes (Signed)
Pt arrived via [facility] transport after being accepted at [facility]. Pt sent to ED for medical clearance. Rambling speech, flight of ideas.

## 2013-03-16 NOTE — ED Notes (Signed)
Pt walk-in at behavioral.  Needs med clearance for tx.  Pt is manic and stopped meds 6 months ago.  Having flight of ideas, incoherent thoughts, insomnia.  Escorted by behavioral tech for evaluation.

## 2013-03-16 NOTE — ED Notes (Signed)
Pt taked to BR for urine sample. Pt states she forgot to urinate in sample cup

## 2013-03-17 ENCOUNTER — Encounter (HOSPITAL_COMMUNITY): Payer: Self-pay | Admitting: *Deleted

## 2013-03-17 DIAGNOSIS — F3110 Bipolar disorder, current episode manic without psychotic features, unspecified: Secondary | ICD-10-CM | POA: Diagnosis present

## 2013-03-17 LAB — GLUCOSE, CAPILLARY
Glucose-Capillary: 130 mg/dL — ABNORMAL HIGH (ref 70–99)
Glucose-Capillary: 102 mg/dL — ABNORMAL HIGH (ref 70–99)

## 2013-03-17 MED ORDER — LISINOPRIL 2.5 MG PO TABS
2.5000 mg | ORAL_TABLET | Freq: Every day | ORAL | Status: DC
  Administered 2013-03-17 – 2013-03-21 (×5): 2.5 mg via ORAL
  Filled 2013-03-17 (×2): qty 1
  Filled 2013-03-17: qty 4
  Filled 2013-03-17 (×5): qty 1
  Filled 2013-03-17: qty 4

## 2013-03-17 MED ORDER — CLONIDINE HCL 0.1 MG/24HR TD PTWK
0.1000 mg | MEDICATED_PATCH | TRANSDERMAL | Status: DC
  Administered 2013-03-17: 0.1 mg via TRANSDERMAL
  Filled 2013-03-17 (×3): qty 1

## 2013-03-17 MED ORDER — LAMOTRIGINE 100 MG PO TABS
100.0000 mg | ORAL_TABLET | Freq: Every day | ORAL | Status: DC
  Administered 2013-03-18 – 2013-03-21 (×4): 100 mg via ORAL
  Filled 2013-03-17: qty 1
  Filled 2013-03-17: qty 4
  Filled 2013-03-17 (×3): qty 1
  Filled 2013-03-17: qty 4
  Filled 2013-03-17: qty 1

## 2013-03-17 MED ORDER — POTASSIUM CHLORIDE CRYS ER 10 MEQ PO TBCR
10.0000 meq | EXTENDED_RELEASE_TABLET | Freq: Every day | ORAL | Status: DC
  Administered 2013-03-17 – 2013-03-21 (×5): 10 meq via ORAL
  Filled 2013-03-17 (×4): qty 1
  Filled 2013-03-17: qty 4
  Filled 2013-03-17 (×3): qty 1
  Filled 2013-03-17: qty 4

## 2013-03-17 MED ORDER — VITAMIN B-12 1000 MCG PO TABS
1000.0000 ug | ORAL_TABLET | Freq: Every day | ORAL | Status: DC
  Administered 2013-03-17 – 2013-03-21 (×5): 1000 ug via ORAL
  Filled 2013-03-17 (×8): qty 1

## 2013-03-17 MED ORDER — HYDROCHLOROTHIAZIDE 25 MG PO TABS
25.0000 mg | ORAL_TABLET | Freq: Every day | ORAL | Status: DC
  Administered 2013-03-17 – 2013-03-21 (×5): 25 mg via ORAL
  Filled 2013-03-17 (×5): qty 1
  Filled 2013-03-17: qty 4
  Filled 2013-03-17: qty 1
  Filled 2013-03-17: qty 4
  Filled 2013-03-17: qty 1

## 2013-03-17 MED ORDER — LAMOTRIGINE 200 MG PO TABS: 200.0000 mg | ORAL_TABLET | Freq: Every day | ORAL | Status: DC

## 2013-03-17 NOTE — Progress Notes (Signed)
Pt. B/P 187/126 p-103 R-20  No headaches or dizziness reported.  Simon PA notified.  New orders given.  See orders. Pt. Reassessed for B/P  110/60

## 2013-03-17 NOTE — Progress Notes (Signed)
Pt is a 42 year old AAF admitted to the services of Dr. Daleen Bo for treatment of Bipolar Disorder with medication non-compliance.  Pt's sister who is also Bipolar had been taken off of her medication so Pt thought maybe she could also come off of her medication.  Pt's husband stated that Pt had been sleeping very little at night and had been talking non-stop.  Pt is cooperative with admission process, logical coherent speech.  Pt did not wish to take anything for sleep other than taking her home medication as ordered.  Pt ate snack, then went to bed.  She denied pain at this time.  Pt has NKDA.  She is employed and denies SI/HI/hallucinations of any kind.  Pt lists her husband, Mildreth Reek, as her emergency contact and he may be reached at [phone]

## 2013-03-17 NOTE — BHH Counselor (Signed)
Adult Comprehensive Assessment  Patient ID: [name], female   DOB: 12/22/1970, 42 y.o.   MRN: [id number]  Information Source: Information source: Patient  Current Stressors:  Educational / Learning stressors: None Employment / Job issues: Employed but no problems Family Relationships: None Surveyor, quantity / Lack of resources (include bankruptcy): Struggling financially Housing / Lack of housing: None Physical health (include injuries & life threatening diseases): Diabetes, HTN,High Cholesteral Social relationships: None Substance abuse: None Bereavement / Loss: None  Living/Environment/Situation:  Living Arrangements: Spouse/significant other;Children Living conditions (as described by patient or guardian): god How long has patient lived in current situation?: 5 years What is atmosphere in current home: Comfortable  Family History:  Marital status: Married Number of Years Married: 16 What types of issues is patient dealing with in the relationship?: None Does patient have children?: Yes How many children?: 2 How is patient's relationship with their children?: Good relationship with 74 and 69 year old children  Childhood History:  By whom was/is the patient raised?: Mother Additional childhood history information: Good relationship with parents Description of patient's relationship with caregiver when they were a child: Good Patient's description of current relationship with people who raised him/her: Good Does patient have siblings?: Yes Number of Siblings: 3 Description of patient's current relationship with siblings: Good relationship.  Patient is a triplet.  Triplet brother died when patient was 22 years old Did patient suffer any verbal/emotional/physical/sexual abuse as a child?: No Did patient suffer from severe childhood neglect?: No Has patient ever been sexually abused/assaulted/raped as an adolescent or adult?: No Was the patient ever a victim of a crime or a  disaster?: No Witnessed domestic violence?: No Has patient been effected by domestic violence as an adult?: No  Education:  Highest grade of school patient has completed: 2 years of college Currently a student?: No Contact person:  (Spouse) Learning disability?: No  Employment/Work Situation:   Employment situation: Employed Where is patient currently employed?: [employer] How long has patient been employed?: 6 yers Patient's job has been impacted by current illness: No What is the longest time patient has a held a job?: 6  yeaars Where was the patient employed at that time?: Current employer Has patient ever been in the [employer]?: No Has patient ever served in Buyer, retail?: No  Financial Resources:   Financial resources: Income from employment  Alcohol/Substance Abuse:   What has been your use of drugs/alcohol within the last 12 months?: Denies If attempted suicide, did drugs/alcohol play a role in this?: No Alcohol/Substance Abuse Treatment Hx: Denies past history Has alcohol/substance abuse ever caused legal problems?: No  Social Support System:   Patient's Community Support System: Good Describe Community Support System: [place] Type of faith/religion: Christian How does patient's faith help to cope with current illness?: Chief Operating Officer:   Leisure and Hobbies: Bowling and reading  Strengths/Needs:   What things does the patient do well?: Smile In what areas does patient struggle / problems for patient: Self criticism  Discharge Plan:   Does patient have access to transportation?: Yes Will patient be returning to same living situation after discharge?: Yes Currently receiving community mental health services:  (Dr. Evelene Croon) Does patient have financial barriers related to discharge medications?: No  Summary/Recommendations:  [name] is a 42 year old African American female admitted with Bipolar Disorder.  She will benefit from crisis stabilization,  evaluation for medication, psycho-education groups for coping skills development, group therapy and case management for discharge planning.  [name], Joesph July. 03/17/2013

## 2013-03-17 NOTE — BHH Suicide Risk Assessment (Signed)
Suicide Risk Assessment  Admission Assessment     Nursing information obtained from:  Patient Demographic factors:  NA Current Mental Status:  NA Loss Factors:  NA Historical Factors:  Family history of suicide;Family history of mental illness or substance abuse;Domestic violence in family of origin Risk Reduction Factors:  Sense of responsibility to family;Religious beliefs about death;Employed;Living with another person, especially a relative;Positive social support;Positive coping skills or problem solving skills  CLINICAL FACTORS:   Bipolar Disorder:   Depressive phase  COGNITIVE FEATURES THAT CONTRIBUTE TO RISK:  Closed-mindedness Thought constriction (tunnel vision)    SUICIDE RISK:   Moderate:  Frequent suicidal ideation with limited intensity, and duration, some specificity in terms of plans, no associated intent, good self-control, limited dysphoria/symptomatology, some risk factors present, and identifiable protective factors, including available and accessible social support.  PLAN OF CARE: Supportive approach/coping skills                               Resume her medications  I certify that inpatient services furnished can reasonably be expected to improve the patient's condition.  [name] A 03/17/2013, 6:59 PM

## 2013-03-17 NOTE — Tx Team (Signed)
Initial Interdisciplinary Treatment Plan  PATIENT STRENGTHS: (choose at least two) Ability for insight Active sense of humor Average or above average intelligence Capable of independent living Communication skills Financial means Motivation for treatment/growth Supportive family/friends Work skills  PATIENT STRESSORS: Health problems Medication change or noncompliance   PROBLEM LIST: Problem List/Patient Goals Date to be addressed Date deferred Reason deferred Estimated date of resolution  Medication stabilization 03/16/13     Mood disorder 03/16/12                                                DISCHARGE CRITERIA:  Improved stabilization in mood, thinking, and/or behavior Motivation to continue treatment in a less acute level of care  PRELIMINARY DISCHARGE PLAN: Return to previous living arrangement Return to previous work or school arrangements  PATIENT/FAMIILY INVOLVEMENT: This treatment plan has been presented to and reviewed with the patient, [name].  The patient and family have been given the opportunity to ask questions and make suggestions.  Juliann Pares 03/17/2013, 12:33 AM

## 2013-03-17 NOTE — Progress Notes (Signed)
BHH LCSW Group Therapy        Overcoming Obstacles 1:15 2:30 PM         03/17/2013 3:13 PM  Type of Therapy:  Group Therapy  Participation Level:  Active  Participation Quality:  Appropriate and Attentive  Affect:  Appropriate  Cognitive:  Alert and Appropriate  Insight:  Engaged  Engagement in Therapy:  Engaged  Modes of Intervention:  Discussion, Exploration, Problem-solving, Rapport Building and Support  Summary of Progress/Problems:  Patient shared the obstacle she needs to overcome is comparing herself to her sister.  She shared she became upset because MD told her she would need to be on medications for the rest of her life for bipolar disorder but her triplet sister would not need medications. Group members confronted patient and help her to see that although she is a triplet, there are some thing that are different about she and her sister.  Wynn Banker 03/17/2013, 3:13 PM

## 2013-03-17 NOTE — Tx Team (Signed)
Interdisciplinary Treatment Plan Update   Date Reviewed:  03/17/2013  Time Reviewed:  10:03 AM  Progress in Treatment:   Attending groups: Yes Participating in groups: Yes Taking medication as prescribed: Yes  Tolerating medication: Yes Family/Significant other contact made:  No, but will request consent to make contact with family Patient understands diagnosis: Yes  Discussing patient identified problems/goals with staff: Yes Medical problems stabilized or resolved: Yes Denies suicidal/homicidal ideation: Yes Patient has not harmed self or others: Yes  For review of initial/current patient goals, please see plan of care.  Estimated Length of Stay:  3-5 days  Reasons for Continued Hospitalization:  Anxiety Depression Medication stabilization   New Problems/Goals identified:    Discharge Plan or Barriers:   Home with outpatient follow up  Additional Comments:  Patient advised of admitting to hospital due to being off medications.  She is not endorsing SI/HI.  She rates depression at zero and anxiety at three.  Attendees:  Patient:   [name] 03/17/2013 10:03 AM   Signature:  03/17/2013 10:03 AM  Signature: 03/17/2013 10:03 AM  Signature:  03/17/2013 10:03 AM  Signature:Beverly Terrilee Croak, RN 03/17/2013 10:03 AM  Signature:  Neill Loft RN 03/17/2013 10:03 AM  Signature:  Juline Patch, LCSW 03/17/2013 10:03 AM  Signature: Silverio Decamp, PMH-NP 03/17/2013 10:03 AM  Signature: Liliane Bade, BSW 03/17/2013 10:03 AM  Signature:  03/17/2013 10:03 AM  Signature:    Signature:    Signature:      Scribe for Treatment Team:   Juline Patch,  03/17/2013 10:03 AM

## 2013-03-17 NOTE — H&P (Signed)
Psychiatric Admission Assessment Adult  Patient Identification:  [name] Date of Evaluation:  03/17/2013 Chief Complaint:  BIPOLAR D/O,MANIC History of Present Illness:: Patient stopped taking her medications 6 months ago and has not slept the past few nights, flight of ideas, disorganized, denies depression.  Her twin sister went off her medications and [name] thought she could also.  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  Hyperverbal on assessment and crying at times when talking about the death of her brother, but denies depression.  Her blood pressure was elevated and she stated she takes blood pressure medications sometimes--started on HCTZ with potassium and will continue to monitor.  Anemic and her B-12 restarted.  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  Associated Signs/Synptoms: Depression Symptoms:  None noted (Hypo) Manic Symptoms:  Distractibility, Elevated Mood, Flight of Ideas, Impulsivity, Labiality of Mood, Anxiety Symptoms:  None Psychotic Symptoms:  None PTSD Symptoms:  None  Psychiatric Specialty Exam: Physical Exam:  Completed in the ED, reviewed, stable  Review of Systems  Constitutional: Negative.   HENT: Negative.   Eyes: Negative.   Respiratory: Negative.   Cardiovascular: Negative.   Gastrointestinal: Negative.   Genitourinary: Negative.   Musculoskeletal: Negative.   Skin: Negative.   Neurological: Negative.   Endo/Heme/Allergies: Negative.   Psychiatric/Behavioral:       Denies, manic    Blood pressure 151/89, pulse 101, temperature 98.6 F (37 C), temperature source Oral, resp. rate 18, height 4\' 9"  (1.448 m), weight 78.109 kg (172 lb 3.2 oz), last menstrual period 03/13/2013.Body mass index is 37.25 kg/(m^2).  General Appearance:  Casual  Eye Contact::  Fair  Speech:  Pressured  Volume:  Normal  Mood:  Euphoric  Affect:  Labile  Thought Process:  Disorganized  Orientation:  Full (Time, Place, and Person)  Thought Content:  WDL  Suicidal Thoughts:  No  Homicidal Thoughts:  No  Memory:  Immediate;   Fair Recent;   Fair Remote;   Fair  Judgement:  Impaired  Insight:  Lacking  Psychomotor Activity:  Increased  Concentration:  Fair  Recall:  Fair  Akathisia:  No  Handed:  Right  AIMS (if indicated):     Assets:  Intimacy Resilience Social Support  Sleep:  Number of Hours: 4.75    Past Psychiatric History: Diagnosis:  Bipolar   Hospitalizations:  In [place]  Outpatient Care:   Dr Evelene Croon  Substance Abuse Care:  None   Self-Mutilation:  None  Suicidal Attempts:  None  Violent Behaviors:  None    Past Medical History:   Past Medical History  Diagnosis Date  . Hypertension   . Diabetes mellitus without complication   . Hypercholesteremia    Shunt as a baby Allergies:  No Known Allergies PTA Medications: Prescriptions prior to admission  Medication Sig Dispense Refill  . Asenapine Maleate (SAPHRIS) 10 MG SUBL Place 1 tablet under the tongue daily.      Marland Kitchen aspirin 81 MG tablet Take 81 mg by mouth daily.      . divalproex (DEPAKOTE ER) 500 MG 24 hr tablet Take 1,000 mg by mouth daily.      Marland Kitchen lamoTRIgine (LAMICTAL) 200 MG tablet Take 200 mg by mouth daily.      Marland Kitchen  metFORMIN (GLUCOPHAGE) 1000 MG tablet Take 1,000 mg by mouth daily with breakfast.      . Methylsulfonylmethane (MSM) 1000 MG CAPS Take 1 capsule by mouth daily.      . pravastatin (PRAVACHOL) 80 MG tablet Take 80 mg by mouth daily.      . vitamin B-12 (CYANOCOBALAMIN) 1000 MCG tablet Take 1,000 mcg by mouth daily.        Previous Psychotropic Medications:  Medication/Dose    See PTA   Substance Abuse History in the last 12 months:  no  Consequences of Substance Abuse: NA  Social History:  reports that she has never smoked. She does  not have any smokeless tobacco history on file. She reports that she does not drink alcohol or use illicit drugs. Additional Social History: History of alcohol / drug use?: No history of alcohol / drug abuse  Current Place of Residence:   Place of Birth:   Family Members: Marital Status:  Married Children:  Sons:  Daughters: Relationships: Education:  Corporate treasurer Problems/Performance: Religious Beliefs/Practices: History of Abuse (Emotional/Phsycial/Sexual) Occupational Experiences; Military History:  None. Legal History: Hobbies/Interests:  Family History:  History reviewed. No pertinent family history.  Results for orders placed during the hospital encounter of 03/16/13 (from the past 72 hour(s))  GLUCOSE, CAPILLARY     Status: Abnormal   Collection Time    03/17/13  6:05 AM      Result Value Range   Glucose-Capillary 130 (*) 70 - 99 mg/dL   Psychological Evaluations:  Assessment:   AXIS I:  Bipolar, Manic AXIS II:  Deferred AXIS III:   Past Medical History  Diagnosis Date  . Hypertension   . Diabetes mellitus without complication   . Hypercholesteremia    AXIS IV:  other psychosocial or environmental problems, problems related to social environment and problems with primary support group AXIS V:  41-50 serious symptoms  Treatment Plan:  Review of chart, vital signs, medications, and notes. 1-Admit for crisis management and stabilization.  Estimated length of stay 5-7 days past his current stay of 1 2-Individual and group therapy encouraged 3-Medication management for bipolar mania to reduce current symptoms to base line and improve the patient's overall level of functioning:  Medications reviewed with the patient and she stated no untoward effects 4-Coping skills for bipolar developing-- 5-Continue crisis stabilization and management 6-Address health issues--monitoring blood pressures and HCTZ started with potassium due to elevations, B-12 restarted due  to her anemia 7-Treatment plan in progress to prevent relapse of mood instability 8-Psychosocial education regarding relapse prevention and self-care 8-Health care follow up as needed for any health concerns 9-Call for consult with hospitalist for additional specialty patient services as needed.  Treatment Plan Summary: Daily contact with patient to assess and evaluate symptoms and progress in treatment Medication management  Supportive approach/coping skills Optimize treatment with psychotropics Address any perceptual distortions and improve reality testing Current Medications:  Current Facility-Administered Medications  Medication Dose Route Frequency Provider Last Rate Last Dose  . acetaminophen (TYLENOL) tablet 650 mg  650 mg Oral Q6H PRN Jorje Guild, PA-C      . alum & mag hydroxide-simeth (MAALOX/MYLANTA) 200-200-20 MG/5ML suspension 30 mL  30 mL Oral Q4H PRN Jorje Guild, PA-C      . asenapine (SAPHRIS) sublingual tablet 10 mg  10 mg Sublingual QHS Jorje Guild, PA-C   10 mg at 03/17/13 0000  . aspirin EC tablet 81 mg  81 mg Oral Daily Jorje Guild, PA-C   81 mg  at 03/17/13 0759  . divalproex (DEPAKOTE ER) 24 hr tablet 1,000 mg  1,000 mg Oral Daily Jorje Guild, PA-C   1,000 mg at 03/17/13 0758  . hydrochlorothiazide (HYDRODIURIL) tablet 25 mg  25 mg Oral Daily Nanine Means, NP      . Melene Muller ON 03/18/2013] lamoTRIgine (LAMICTAL) tablet 200 mg  200 mg Oral Daily Nanine Means, NP      . magnesium hydroxide (MILK OF MAGNESIA) suspension 30 mL  30 mL Oral Daily PRN Jorje Guild, PA-C      . metFORMIN (GLUCOPHAGE) tablet 1,000 mg  1,000 mg Oral Q breakfast Jorje Guild, PA-C   1,000 mg at 03/17/13 0759  . potassium chloride (K-DUR,KLOR-CON) CR tablet 10 mEq  10 mEq Oral Daily Nanine Means, NP      . simvastatin (ZOCOR) tablet 40 mg  40 mg Oral q1800 Jorje Guild, PA-C      . traZODone (DESYREL) tablet 50 mg  50 mg Oral QHS PRN Jorje Guild, PA-C      . vitamin B-12 (CYANOCOBALAMIN) tablet 1,000 mcg  1,000 mcg Oral  Daily Nanine Means, NP        Observation Level/Precautions:  15 minute checks  Laboratory:  Ordered and reviewed, stable  Psychotherapy:  Individual and group therapy  Medications:  See PTA  Consultations:  None  Discharge Concerns:  None  Estimated LOS:  5-7 days  Other:     I certify that inpatient services furnished can reasonably be expected to improve the patient's condition.   Nanine Means, PMH-NP 3/31/201410:45 AM

## 2013-03-17 NOTE — Progress Notes (Signed)
Adult Psychoeducational Group Note  Date:  03/17/2013 Time:  6:53 PM  Group Topic/Focus:  Self Care:   The focus of this group is to help patients understand the importance of self-care in order to improve or restore emotional, physical, spiritual, interpersonal, and financial health.  Participation Level:  Active  Participation Quality:  Attentive and Redirectable  Affect:  Appropriate  Cognitive:  Appropriate  Insight: Appropriate  Engagement in Group:  Engaged and Monopolizing  Modes of Intervention:  Discussion, Education, Limit-setting and Support  Additional Comments:  Pt participated in group discussion on the multi-faceted nature of the self. Pt actively participated in group by completing the self-care inventory and sharing areas in which she need to improve her self-care.     Reinaldo Raddle K 03/17/2013, 6:53 PM

## 2013-03-18 DIAGNOSIS — F3110 Bipolar disorder, current episode manic without psychotic features, unspecified: Principal | ICD-10-CM

## 2013-03-18 NOTE — Progress Notes (Signed)
[facility] INPATIENT:  Family/Significant Other Suicide Prevention Education  Suicide Prevention Education:  Education Completed; Raelyn Racette, Husband, [phone] has been identified by the patient as the family member/significant other with whom the patient will be residing, and identified as the person(s) who will aid the patient in the event of a mental health crisis (suicidal ideations/suicide attempt).  With written consent from the patient, the family member/significant other has been provided the following suicide prevention education, prior to the and/or following the discharge of the patient.  The suicide prevention education provided includes the following:  Suicide risk factors  Suicide prevention and interventions  National Suicide Hotline telephone number  [facility] assessment telephone number  [facility] Emergency Assistance 911  [facility] and/or Residential Mobile Crisis Unit telephone number  Request made of family/significant other to:  Remove weapons (e.g., guns, rifles, knives), all items previously/currently identified as safety concern.  Husband reports there are no guns in the home.  Remove drugs/medications (over-the-counter, prescriptions, illicit drugs), all items previously/currently identified as a safety concern.  The family member/significant other verbalizes understanding of the suicide prevention education information provided.  The family member/significant other agrees to remove the items of safety concern listed above.  Wynn Banker 03/18/2013, 9:53 AM

## 2013-03-18 NOTE — Progress Notes (Signed)
[facility] MD Progress Note  03/18/2013 3:24 PM [name]  MRN:  [id number] Subjective:  Patient presented with symptoms of bipolar mania secondary to discontinuation of her medication because of stigma as per patient. She has not slept the past few nights, flight of ideas, disorganized, denies depression. She has been tolerating her medication regimen without adverse effects. She feels better today and asking when she can be discharged.   Diagnosis:  Axis I: Bipolar, Manic  ADL's:  Impaired  Sleep: Fair  Appetite:  Fair  Suicidal Ideation:  She has less suicidal today and denied intension or plan Homicidal Ideation:  Denied homicidal ideation, intension or plan AEB (as evidenced by):  Psychiatric Specialty Exam: ROS  Blood pressure 136/96, pulse 87, temperature 98.4 F (36.9 C), temperature source Oral, resp. rate 18, height 4\' 9"  (1.448 m), weight 172 lb 3.2 oz (78.109 kg), last menstrual period 03/13/2013.Body mass index is 37.25 kg/(m^2).  General Appearance: Casual and Fairly Groomed  Patent attorney::  Good  Speech:  Clear and Coherent  Volume:  Normal  Mood:  Euphoric  Affect:  Appropriate and Full Range  Thought Process:  Coherent, Goal Directed, Linear and Logical  Orientation:  Full (Time, Place, and Person)  Thought Content:  WDL  Suicidal Thoughts:  No  Homicidal Thoughts:  No  Memory:  Immediate;   Fair  Judgement:  Intact  Insight:  Present  Psychomotor Activity:  Decreased  Concentration:  Fair  Recall:  Good  Akathisia:  Negative  Handed:  Right  AIMS (if indicated):     Assets:  Architect Physical Health Social Support  Sleep:  Number of Hours: 6   Current Medications: Current Facility-Administered Medications  Medication Dose Route Frequency Provider Last Rate Last Dose  . acetaminophen (TYLENOL) tablet 650 mg  650 mg Oral Q6H PRN Jorje Guild, PA-C      . alum & mag hydroxide-simeth (MAALOX/MYLANTA) 200-200-20  MG/5ML suspension 30 mL  30 mL Oral Q4H PRN Jorje Guild, PA-C      . asenapine (SAPHRIS) sublingual tablet 10 mg  10 mg Sublingual QHS Jorje Guild, PA-C   10 mg at 03/17/13 2218  . aspirin EC tablet 81 mg  81 mg Oral Daily Jorje Guild, PA-C   81 mg at 03/18/13 6578  . cloNIDine (CATAPRES - Dosed in mg/24 hr) patch 0.1 mg  0.1 mg Transdermal Weekly Kerry Hough, PA-C   0.1 mg at 03/17/13 2225  . divalproex (DEPAKOTE ER) 24 hr tablet 1,000 mg  1,000 mg Oral Daily Jorje Guild, PA-C   1,000 mg at 03/18/13 4696  . hydrochlorothiazide (HYDRODIURIL) tablet 25 mg  25 mg Oral Daily Nanine Means, NP   25 mg at 03/18/13 0837  . lamoTRIgine (LAMICTAL) tablet 100 mg  100 mg Oral Daily Nanine Means, NP   100 mg at 03/18/13 2952  . lisinopril (PRINIVIL,ZESTRIL) tablet 2.5 mg  2.5 mg Oral Daily Kerry Hough, PA-C   2.5 mg at 03/18/13 8413  . magnesium hydroxide (MILK OF MAGNESIA) suspension 30 mL  30 mL Oral Daily PRN Jorje Guild, PA-C   30 mL at 03/18/13 0602  . metFORMIN (GLUCOPHAGE) tablet 1,000 mg  1,000 mg Oral Q breakfast Jorje Guild, PA-C   1,000 mg at 03/18/13 2440  . potassium chloride (K-DUR,KLOR-CON) CR tablet 10 mEq  10 mEq Oral Daily Nanine Means, NP   10 mEq at 03/18/13 1027  . simvastatin (ZOCOR) tablet 40 mg  40 mg Oral q1800 Hessie Diener  Watt, PA-C   40 mg at 03/17/13 1757  . traZODone (DESYREL) tablet 50 mg  50 mg Oral QHS PRN Jorje Guild, PA-C      . vitamin B-12 (CYANOCOBALAMIN) tablet 1,000 mcg  1,000 mcg Oral Daily Nanine Means, NP   1,000 mcg at 03/18/13 1610    Lab Results:  Results for orders placed during the hospital encounter of 03/16/13 (from the past 48 hour(s))  GLUCOSE, CAPILLARY     Status: Abnormal   Collection Time    03/17/13  6:05 AM      Result Value Range   Glucose-Capillary 130 (*) 70 - 99 mg/dL  GLUCOSE, CAPILLARY     Status: None   Collection Time    03/17/13 11:44 AM      Result Value Range   Glucose-Capillary 97  70 - 99 mg/dL  GLUCOSE, CAPILLARY     Status: Abnormal    Collection Time    03/17/13  5:13 PM      Result Value Range   Glucose-Capillary 102 (*) 70 - 99 mg/dL  GLUCOSE, CAPILLARY     Status: Abnormal   Collection Time    03/18/13  5:38 AM      Result Value Range   Glucose-Capillary 116 (*) 70 - 99 mg/dL  GLUCOSE, CAPILLARY     Status: Abnormal   Collection Time    03/18/13 12:00 PM      Result Value Range   Glucose-Capillary 67 (*) 70 - 99 mg/dL  GLUCOSE, CAPILLARY     Status: None   Collection Time    03/18/13 12:44 PM      Result Value Range   Glucose-Capillary 98  70 - 99 mg/dL    Physical Findings: AIMS: Facial and Oral Movements Muscles of Facial Expression: None, normal Lips and Perioral Area: None, normal Jaw: None, normal Tongue: None, normal,Extremity Movements Upper (arms, wrists, hands, fingers): None, normal Lower (legs, knees, ankles, toes): None, normal, Trunk Movements Neck, shoulders, hips: None, normal, Overall Severity Severity of abnormal movements (highest score from questions above): None, normal Incapacitation due to abnormal movements: None, normal, Dental Status Current problems with teeth and/or dentures?: No Does patient usually wear dentures?: No  CIWA:    COWS:     Treatment Plan Summary: Daily contact with patient to assess and evaluate symptoms and progress in treatment Medication management  Plan: Continue current treatment plan and medication management. Monitor for adverse effects. No medication changes.  Medical Decision Making Problem Points:  Established problem, stable/improving (1) and Review of psycho-social stressors (1) Data Points:  Review or order clinical lab tests (1) Review or order medicine tests (1) Review of medication regiment & side effects (2) Review of new medications or change in dosage (2)  I certify that inpatient services furnished can reasonably be expected to improve the patient's condition.   Miyuki Rzasa,JANARDHAHA R. 03/18/2013, 3:24 PM

## 2013-03-18 NOTE — Patient Care Conference (Signed)
Patient's CBG at 1700 was 104.  Patient has been pleasant and cooperative, ate dinner in her called, called sister after dinner.  Denied SI and HI.  Denied A/V hallucinations.  Denied pain.

## 2013-03-18 NOTE — Progress Notes (Signed)
Patient ID: [name], female   DOB: 11-Feb-1971, 42 y.o.   MRN: [id number] D- Patient reports she slept well and that her appetite is good.  Her energy level is normal and her ability to pay attention is good She rates her depression an8 and she denies pain.  Her speech is tangential and she quickly changes from on e subject to another.  A-CBG at noon 67.  NP made aware.  Patient given snack and plan to recheck

## 2013-03-18 NOTE — Progress Notes (Signed)
Patient ID: [name], female   DOB: Sep 16, 1971, 42 y.o.   MRN: [id number] D: Patient in bed. Appears to be sleeping. Respirations even and non-labored. A: Staff will monitor on q 15 minute checks, follow treatment plan, and give meds as ordered. R: Appears to be sleeping

## 2013-03-19 LAB — GLUCOSE, CAPILLARY
Glucose-Capillary: 70 mg/dL (ref 70–99)
Glucose-Capillary: 128 mg/dL — ABNORMAL HIGH (ref 70–99)

## 2013-03-19 MED ORDER — DIVALPROEX SODIUM ER 500 MG PO TB24
500.0000 mg | ORAL_TABLET | Freq: Two times a day (BID) | ORAL | Status: DC
  Administered 2013-03-19 – 2013-03-20 (×2): 500 mg via ORAL
  Filled 2013-03-19 (×4): qty 1

## 2013-03-19 NOTE — Progress Notes (Signed)
D: Pt denies S/HI/AVH. Pt denies pain and show no s/s of distress. Pt is compliant with med regimen. Pt reported this morning that she accepts her mental illness disorder and plans to continue with her meds. Pt reported that she have supporter family that helps her to make positive choices. Pt presents with appropriate behaviors this morning.  A: Medications administered as ordered per MD. Verbal support given. Shift assessment completed.. 15 minute checks performed for safety.  R: Pt is receptive to treatment.

## 2013-03-19 NOTE — Progress Notes (Signed)
Pt was moved to the 400 hall at change of shift for bizarre behaviors.  Roommate said pt was acting inappropriately after she came out of the shower.  MHT reported that pt was making bizarre statements.  Pt was moved to Rm 401.  On assessment, pt's thoughts were clear with this Clinical research associate.  She reported she was doing ok today.  She denied any pain or discomforts.  She voiced no concerns.  She did request some ice water and said she needed to use the phone.  Encouraged pt to make her needs known to staff.  Pt said she was ready to go home.  Pt denies SI/HI/AV.  Support and encouragement given. Safety maintained with q15 minute checks.

## 2013-03-19 NOTE — Progress Notes (Signed)
Patient ID: [name], female   DOB: [birth year], 42 y.o.   MRN: [id number] [facility] MD Progress Note  03/19/2013 11:12 AM [name]  MRN:  [id number] Subjective: "I was in denial about my diagnoses but I am ready to take care of myself now". Objective: Patient reports decreased racing thoughts, less disorganized, feeling less depressed and sleeping better. Patient reports that she works in [employer] and has come to the realization that she needs to take care of her illnesses in order to keep her job and be useful to her "beautiful family". Patient is compliant with her medications and has not verbalized any adverse reactions.   Diagnosis:  Axis I: Bipolar I Disorder most recent episode manic  ADL's:  Intact  Sleep: Fair  Appetite:  Fair  Suicidal Ideation:  She has less suicidal today and denied intension or plan Homicidal Ideation:  Denied homicidal ideation, intension or plan AEB (as evidenced by):  Psychiatric Specialty Exam: Review of Systems  Constitutional: Negative.   HENT: Positive for congestion.   Eyes: Negative.   Respiratory: Negative.   Gastrointestinal: Negative.   Genitourinary: Negative.   Musculoskeletal: Negative.   Skin: Negative.   Neurological: Negative.   Psychiatric/Behavioral: The patient is nervous/anxious and has insomnia.     Blood pressure 133/90, pulse 88, temperature 98.1 F (36.7 C), temperature source Oral, resp. rate 18, height 4\' 9"  (1.448 m), weight 78.109 kg (172 lb 3.2 oz), last menstrual period 03/13/2013.Body mass index is 37.25 kg/(m^2).  General Appearance: Casual and Fairly Groomed  Patent attorney::  Good  Speech:  Clear and Coherent  Volume:  Normal  Mood:  dysthymic  Affect:  Appropriate and Full Range  Thought Process:  Coherent, Goal Directed, Linear and Logical  Orientation:  Full (Time, Place, and Person)  Thought Content:  WDL  Suicidal Thoughts:  No  Homicidal Thoughts:  No  Memory:  Immediate;   Fair  Judgement:   Intact  Insight:  Present  Psychomotor Activity:  Decreased  Concentration:  Fair  Recall:  Good  Akathisia:  Negative  Handed:  Right  AIMS (if indicated):     Assets:  Architect Physical Health Social Support  Sleep:  Number of Hours: 6.5   Current Medications: Current Facility-Administered Medications  Medication Dose Route Frequency Provider Last Rate Last Dose  . acetaminophen (TYLENOL) tablet 650 mg  650 mg Oral Q6H PRN Jorje Guild, PA-C      . alum & mag hydroxide-simeth (MAALOX/MYLANTA) 200-200-20 MG/5ML suspension 30 mL  30 mL Oral Q4H PRN Jorje Guild, PA-C      . asenapine (SAPHRIS) sublingual tablet 10 mg  10 mg Sublingual QHS Jorje Guild, PA-C   10 mg at 03/18/13 2143  . aspirin EC tablet 81 mg  81 mg Oral Daily Jorje Guild, PA-C   81 mg at 03/19/13 0806  . cloNIDine (CATAPRES - Dosed in mg/24 hr) patch 0.1 mg  0.1 mg Transdermal Weekly Kerry Hough, PA-C   0.1 mg at 03/17/13 2225  . divalproex (DEPAKOTE ER) 24 hr tablet 1,000 mg  1,000 mg Oral Daily Jorje Guild, PA-C   1,000 mg at 03/19/13 0806  . hydrochlorothiazide (HYDRODIURIL) tablet 25 mg  25 mg Oral Daily Nanine Means, NP   25 mg at 03/19/13 0806  . lamoTRIgine (LAMICTAL) tablet 100 mg  100 mg Oral Daily Nanine Means, NP   100 mg at 03/19/13 0806  . lisinopril (PRINIVIL,ZESTRIL) tablet 2.5 mg  2.5 mg Oral Daily  Kerry Hough, PA-C   2.5 mg at 03/19/13 0806  . magnesium hydroxide (MILK OF MAGNESIA) suspension 30 mL  30 mL Oral Daily PRN Jorje Guild, PA-C   30 mL at 03/18/13 0602  . metFORMIN (GLUCOPHAGE) tablet 1,000 mg  1,000 mg Oral Q breakfast Jorje Guild, PA-C   1,000 mg at 03/19/13 4098  . potassium chloride (K-DUR,KLOR-CON) CR tablet 10 mEq  10 mEq Oral Daily Nanine Means, NP   10 mEq at 03/19/13 0806  . simvastatin (ZOCOR) tablet 40 mg  40 mg Oral q1800 Jorje Guild, PA-C   40 mg at 03/18/13 1718  . traZODone (DESYREL) tablet 50 mg  50 mg Oral QHS PRN Jorje Guild, PA-C      . vitamin  B-12 (CYANOCOBALAMIN) tablet 1,000 mcg  1,000 mcg Oral Daily Nanine Means, NP   1,000 mcg at 03/19/13 1191    Lab Results:  Results for orders placed during the hospital encounter of 03/16/13 (from the past 48 hour(s))  GLUCOSE, CAPILLARY     Status: None   Collection Time    03/17/13 11:44 AM      Result Value Range   Glucose-Capillary 97  70 - 99 mg/dL  GLUCOSE, CAPILLARY     Status: Abnormal   Collection Time    03/17/13  5:13 PM      Result Value Range   Glucose-Capillary 102 (*) 70 - 99 mg/dL  GLUCOSE, CAPILLARY     Status: Abnormal   Collection Time    03/18/13  5:38 AM      Result Value Range   Glucose-Capillary 116 (*) 70 - 99 mg/dL  GLUCOSE, CAPILLARY     Status: Abnormal   Collection Time    03/18/13 12:00 PM      Result Value Range   Glucose-Capillary 67 (*) 70 - 99 mg/dL  GLUCOSE, CAPILLARY     Status: None   Collection Time    03/18/13 12:44 PM      Result Value Range   Glucose-Capillary 98  70 - 99 mg/dL  GLUCOSE, CAPILLARY     Status: Abnormal   Collection Time    03/18/13  4:53 PM      Result Value Range   Glucose-Capillary 104 (*) 70 - 99 mg/dL  GLUCOSE, CAPILLARY     Status: Abnormal   Collection Time    03/18/13 10:01 PM      Result Value Range   Glucose-Capillary 106 (*) 70 - 99 mg/dL  GLUCOSE, CAPILLARY     Status: Abnormal   Collection Time    03/19/13  6:49 AM      Result Value Range   Glucose-Capillary 107 (*) 70 - 99 mg/dL   Comment 1 Notify RN      Physical Findings: AIMS: Facial and Oral Movements Muscles of Facial Expression: None, normal Lips and Perioral Area: None, normal Jaw: None, normal Tongue: None, normal,Extremity Movements Upper (arms, wrists, hands, fingers): None, normal Lower (legs, knees, ankles, toes): None, normal, Trunk Movements Neck, shoulders, hips: None, normal, Overall Severity Severity of abnormal movements (highest score from questions above): None, normal Incapacitation due to abnormal movements: None,  normal, Dental Status Current problems with teeth and/or dentures?: No Does patient usually wear dentures?: No  CIWA:    COWS:     Treatment Plan Summary: Daily contact with patient to assess and evaluate symptoms and progress in treatment Medication management  Plan:1. Admit for crisis management and stabilization. 2. Medication management to reduce current symptoms  to base line and improve the patient's overall level of functioning 3. Treat health problems as indicated. 4. Develop treatment plan to decrease risk of relapse upon discharge and the need for readmission. 5. Psycho-social education regarding relapse prevention and self care. 6. Health care follow up as needed for medical problems. 7. Restart home medications where appropriate. 8. Valproic acid level on Friday.  Medical Decision Making Problem Points:  Established problem, stable/improving (1) and Review of psycho-social stressors (1) Data Points:  Review or order clinical lab tests (1) Review or order medicine tests (1) Review of medication regiment & side effects (2) Review of new medications or change in dosage (2)  I certify that inpatient services furnished can reasonably be expected to improve the patient's condition.   Maxim Bedel,MD 03/19/2013, 11:12 AM

## 2013-03-20 LAB — GLUCOSE, CAPILLARY
Glucose-Capillary: 103 mg/dL — ABNORMAL HIGH (ref 70–99)
Glucose-Capillary: 114 mg/dL — ABNORMAL HIGH (ref 70–99)

## 2013-03-20 MED ORDER — DIVALPROEX SODIUM ER 500 MG PO TB24
1000.0000 mg | ORAL_TABLET | Freq: Every day | ORAL | Status: DC
  Filled 2013-03-20: qty 2
  Filled 2013-03-20 (×2): qty 8

## 2013-03-20 NOTE — Progress Notes (Signed)
Patient ID: [name], female   DOB: [birth year], 42 y.o.   MRN: [id number]  D: Pt denies SI/HI/AVH. Pt is pleasant and cooperative. Pt appears paranoid about her stay here, but is calm. Pt says she is ready to leave. Pt was a little guarded.   A: Pt was offered support and encouragement. Pt was given scheduled medications.  Q 15 minute checks were done for safety.    R: Pt is taking medication. Pt has no complaints at this time.Pt receptive to treatment and safety maintained on unit.

## 2013-03-20 NOTE — Progress Notes (Signed)
D:  Patient up and about in her room today.  Pleasant on approach.  States she is disappointed about having to stay here another day, but understands.  Her appetite is good and she is denying suicidal ideation.  A:  Medications given as prescribed.  Educated patient about the need to stay so that we could ensure that her Depakote level was at a therapeutic level before letting her leave.   R:  Patient verbalized understanding.  Has been pleasant and cooperative with staff and peers.

## 2013-03-20 NOTE — Progress Notes (Signed)
Patient ID: [name], female   DOB: 08/11/1971, 42 y.o.   MRN: [id number]  D: Pt was sitting on her bed during the assessment. When asked about her day pt stated. "Coming outta my shell." Writer asked pt circumstances that contributed to her stay at [facility]. Pt stated, "I'm speaking up and talking more. I scared 'em", referring to her family.  Pt stated her sisters felt she was talking too much, "being manic", but her husband thought it was "ok".   A:  Support and encouragement was offered. 15 min checks continued for safety.  R: Pt remains safe.

## 2013-03-20 NOTE — Progress Notes (Signed)
Patient ID: [name], female   DOB: 23-Sep-1971, 42 y.o.   MRN: [id number] [facility] MD Progress Note  03/20/2013 9:46 AM [name]  MRN:  [id number] Subjective: "I am doing pretty good". Objective: Patient reports decreased racing thoughts, mood swings, irritability but remains talkative and has difficulty sleeping. She denies psychosis, delusions, suicidal and homicidal ideations, intent or plan. Patient is compliant with her medications and has not verbalized any adverse reactions.   Diagnosis:  Axis I: Bipolar I Disorder most recent episode manic  ADL's:  Intact  Sleep: Fair  Appetite: good  Suicidal Ideation:  She has less suicidal today and denied intension or plan Homicidal Ideation:  Denied homicidal ideation, intension or plan AEB (as evidenced by):  Psychiatric Specialty Exam: Review of Systems  Constitutional: Negative.   HENT: Positive for congestion.   Eyes: Negative.   Respiratory: Negative.   Gastrointestinal: Negative.   Genitourinary: Negative.   Musculoskeletal: Negative.   Skin: Negative.   Neurological: Negative.   Psychiatric/Behavioral: The patient is nervous/anxious and has insomnia.     Blood pressure 129/83, pulse 92, temperature 98.9 F (37.2 C), temperature source Oral, resp. rate 16, height 4\' 9"  (1.448 m), weight 78.109 kg (172 lb 3.2 oz), last menstrual period 03/13/2013.Body mass index is 37.25 kg/(m^2).  General Appearance: Casual and Fairly Groomed  Patent attorney::  Good  Speech:  Clear and Coherent  Volume:  Normal  Mood:  dysthymic  Affect:  Appropriate and Full Range  Thought Process:  Coherent, Goal Directed, Linear and Logical  Orientation:  Full (Time, Place, and Person)  Thought Content:  WDL  Suicidal Thoughts:  No  Homicidal Thoughts:  No  Memory:  Immediate;   Fair  Judgement:  Intact  Insight:  Present  Psychomotor Activity:  Decreased  Concentration:  Fair  Recall:  Good  Akathisia:  Negative  Handed:  Right  AIMS (if  indicated):     Assets:  Architect Physical Health Social Support  Sleep:  Number of Hours: 3.5   Current Medications: Current Facility-Administered Medications  Medication Dose Route Frequency Provider Last Rate Last Dose  . acetaminophen (TYLENOL) tablet 650 mg  650 mg Oral Q6H PRN Jorje Guild, PA-C      . alum & mag hydroxide-simeth (MAALOX/MYLANTA) 200-200-20 MG/5ML suspension 30 mL  30 mL Oral Q4H PRN Jorje Guild, PA-C      . asenapine (SAPHRIS) sublingual tablet 10 mg  10 mg Sublingual QHS Jorje Guild, PA-C   10 mg at 03/19/13 2341  . aspirin EC tablet 81 mg  81 mg Oral Daily Jorje Guild, PA-C   81 mg at 03/20/13 0825  . cloNIDine (CATAPRES - Dosed in mg/24 hr) patch 0.1 mg  0.1 mg Transdermal Weekly Kerry Hough, PA-C   0.1 mg at 03/17/13 2225  . divalproex (DEPAKOTE ER) 24 hr tablet 500 mg  500 mg Oral BID PC Riko Lumsden   500 mg at 03/20/13 0824  . hydrochlorothiazide (HYDRODIURIL) tablet 25 mg  25 mg Oral Daily Nanine Means, NP   25 mg at 03/20/13 0825  . lamoTRIgine (LAMICTAL) tablet 100 mg  100 mg Oral Daily Nanine Means, NP   100 mg at 03/20/13 0824  . lisinopril (PRINIVIL,ZESTRIL) tablet 2.5 mg  2.5 mg Oral Daily Kerry Hough, PA-C   2.5 mg at 03/20/13 7829  . magnesium hydroxide (MILK OF MAGNESIA) suspension 30 mL  30 mL Oral Daily PRN Jorje Guild, PA-C   30 mL at 03/18/13  0602  . metFORMIN (GLUCOPHAGE) tablet 1,000 mg  1,000 mg Oral Q breakfast Jorje Guild, PA-C   1,000 mg at 03/20/13 4098  . potassium chloride (K-DUR,KLOR-CON) CR tablet 10 mEq  10 mEq Oral Daily Nanine Means, NP   10 mEq at 03/20/13 0824  . simvastatin (ZOCOR) tablet 40 mg  40 mg Oral q1800 Jorje Guild, PA-C   40 mg at 03/19/13 1740  . traZODone (DESYREL) tablet 50 mg  50 mg Oral QHS PRN Jorje Guild, PA-C      . vitamin B-12 (CYANOCOBALAMIN) tablet 1,000 mcg  1,000 mcg Oral Daily Nanine Means, NP   1,000 mcg at 03/20/13 1191    Lab Results:  Results for orders placed  during the hospital encounter of 03/16/13 (from the past 48 hour(s))  GLUCOSE, CAPILLARY     Status: Abnormal   Collection Time    03/18/13 12:00 PM      Result Value Range   Glucose-Capillary 67 (*) 70 - 99 mg/dL  GLUCOSE, CAPILLARY     Status: None   Collection Time    03/18/13 12:44 PM      Result Value Range   Glucose-Capillary 98  70 - 99 mg/dL  GLUCOSE, CAPILLARY     Status: Abnormal   Collection Time    03/18/13  4:53 PM      Result Value Range   Glucose-Capillary 104 (*) 70 - 99 mg/dL  GLUCOSE, CAPILLARY     Status: Abnormal   Collection Time    03/18/13 10:01 PM      Result Value Range   Glucose-Capillary 106 (*) 70 - 99 mg/dL  GLUCOSE, CAPILLARY     Status: Abnormal   Collection Time    03/19/13  6:49 AM      Result Value Range   Glucose-Capillary 107 (*) 70 - 99 mg/dL   Comment 1 Notify RN    GLUCOSE, CAPILLARY     Status: None   Collection Time    03/19/13 11:59 AM      Result Value Range   Glucose-Capillary 70  70 - 99 mg/dL  GLUCOSE, CAPILLARY     Status: Abnormal   Collection Time    03/19/13  5:34 PM      Result Value Range   Glucose-Capillary 128 (*) 70 - 99 mg/dL  GLUCOSE, CAPILLARY     Status: Abnormal   Collection Time    03/19/13  9:22 PM      Result Value Range   Glucose-Capillary 123 (*) 70 - 99 mg/dL  GLUCOSE, CAPILLARY     Status: Abnormal   Collection Time    03/20/13  6:04 AM      Result Value Range   Glucose-Capillary 103 (*) 70 - 99 mg/dL    Physical Findings: AIMS: Facial and Oral Movements Muscles of Facial Expression: None, normal Lips and Perioral Area: None, normal Jaw: None, normal Tongue: None, normal,Extremity Movements Upper (arms, wrists, hands, fingers): None, normal Lower (legs, knees, ankles, toes): None, normal, Trunk Movements Neck, shoulders, hips: None, normal, Overall Severity Severity of abnormal movements (highest score from questions above): None, normal Incapacitation due to abnormal movements: None,  normal Patient's awareness of abnormal movements (rate only patient's report): No Awareness, Dental Status Current problems with teeth and/or dentures?: No Does patient usually wear dentures?: No  CIWA:    COWS:     Treatment Plan Summary: Daily contact with patient to assess and evaluate symptoms and progress in treatment Medication management  Plan:1. Admit  for crisis management and stabilization. 2. Medication management to reduce current symptoms to base line and improve the patient's overall level of functioning 3. Treat health problems as indicated. 4. Develop treatment plan to decrease risk of relapse upon discharge and the need for readmission. 5. Psycho-social education regarding relapse prevention and self care. 6. Health care follow up as needed for medical problems. 7. Restart home medications where appropriate. 8. Valproic acid level on Friday. 9. ELOS 2-3 days.  Medical Decision Making Problem Points:  Established problem, stable/improving (1) and Review of psycho-social stressors (1) Data Points:  Review or order clinical lab tests (1) Review or order medicine tests (1) Review of medication regiment & side effects (2) Review of new medications or change in dosage (2)  I certify that inpatient services furnished can reasonably be expected to improve the patient's condition.   Rio Taber,MD 03/20/2013, 9:46 AM

## 2013-03-21 LAB — GLUCOSE, CAPILLARY: Glucose-Capillary: 75 mg/dL (ref 70–99)

## 2013-03-21 MED ORDER — METFORMIN HCL 1000 MG PO TABS: 1000.0000 mg | ORAL_TABLET | Freq: Every day | ORAL | Status: DC

## 2013-03-21 MED ORDER — HYDROCHLOROTHIAZIDE 25 MG PO TABS: 25.0000 mg | ORAL_TABLET | Freq: Every day | ORAL | Status: DC

## 2013-03-21 MED ORDER — ASENAPINE MALEATE 5 MG SL SUBL: 10.0000 mg | SUBLINGUAL_TABLET | Freq: Every day | SUBLINGUAL | Status: DC

## 2013-03-21 MED ORDER — ASPIRIN 81 MG PO TABS: 81.0000 mg | ORAL_TABLET | Freq: Every day | ORAL | Status: DC

## 2013-03-21 MED ORDER — PRAVASTATIN SODIUM 80 MG PO TABS: 80.0000 mg | ORAL_TABLET | Freq: Every day | ORAL | Status: DC

## 2013-03-21 MED ORDER — LISINOPRIL 2.5 MG PO TABS: 2.5000 mg | ORAL_TABLET | Freq: Every day | ORAL | Status: DC

## 2013-03-21 MED ORDER — TRAZODONE HCL 50 MG PO TABS: 50.0000 mg | ORAL_TABLET | Freq: Every evening | ORAL | Status: DC | PRN

## 2013-03-21 MED ORDER — POTASSIUM CHLORIDE CRYS ER 10 MEQ PO TBCR: 10.0000 meq | EXTENDED_RELEASE_TABLET | Freq: Every day | ORAL | Status: DC

## 2013-03-21 MED ORDER — CLONIDINE HCL 0.1 MG/24HR TD PTWK: 30.0000 | MEDICATED_PATCH | TRANSDERMAL | Status: DC

## 2013-03-21 MED ORDER — VITAMIN B-12 1000 MCG PO TABS: 1000.0000 ug | ORAL_TABLET | Freq: Every day | ORAL | Status: DC

## 2013-03-21 MED ORDER — DIVALPROEX SODIUM ER 500 MG PO TB24: 1000.0000 mg | ORAL_TABLET | Freq: Every day | ORAL | Status: DC

## 2013-03-21 MED ORDER — LAMOTRIGINE 100 MG PO TABS: 100.0000 mg | ORAL_TABLET | Freq: Every day | ORAL | Status: DC

## 2013-03-21 NOTE — Discharge Summary (Signed)
Physician Discharge Summary Note  Patient:  [name] is an 42 y.o., female MRN:  [id number] DOB:  [birth year] Patient phone:  [phone] (home)  Patient address:   [address] [facility] [place] [address],   Date of Admission:  03/16/2013  Date of Discharge: 03/21/13  Reason for Admission: Flight of ideas, disorganized  Discharge Diagnoses: Active Problems:   Bipolar I disorder, most recent episode (or current) manic  Review of Systems  Constitutional: Negative.   HENT: Negative.   Eyes: Negative.   Respiratory: Negative.   Cardiovascular: Negative.   Gastrointestinal: Negative.   Genitourinary: Negative.   Musculoskeletal: Negative.   Skin: Negative.   Neurological: Negative.   Endo/Heme/Allergies: Negative.    Axis Diagnosis:   AXIS I:  Bipolar I disorder, most recent episode (or current) manic AXIS II:  Deferred AXIS III:   Past Medical History  Diagnosis Date  . Hypertension   . Diabetes mellitus without complication   . Hypercholesteremia    AXIS IV:  other psychosocial or environmental problems AXIS V:  63  Level of Care:  OP  Hospital Course:  Patient stopped taking her medications 6 months ago and has not slept the past few nights, flight of ideas, disorganized, denies depression. Her twin sister went off her medications and [name] thought she could also. 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 Hyperverbal on assessment and crying at times when talking about the death of her brother, but denies depression. Her blood pressure was elevated and she stated she takes blood pressure medications sometimes--started on HCTZ with potassium and will continue to monitor. Anemic and her B-12 restarted. 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  While a  patient in this hospital and after admission assessment/evaluation, it was determined based on patient's symptoms that [name] will need medication management to stabilize her current manic symptoms. She was ordered and received Saphris 5 mg Q bedtime for mood control, Lamictal 100 mg daily for mood stabilization and Trazodone 50 mg Q bedtime for sleep. She also was enrolled in group counseling sessions and activities where she was counseled and learned coping skills that should help her cope better and manage her symptoms effectively after discharge. She also received medication management and monitoring for her other previously existing medical issues and concerns. She tolerated her treatment regimen without any significant adverse effects and or reactions presented.   Patient did respond positively to her treatment regimen. This is evidenced by her daily reports of improved mood, reduction of symptoms and presentation of good affect/eye contact. She attended treatment team meeting this am and met with her treatment team members. Her reason for admission, present symptoms, response to treatment and discharge plans discussed with patient. [name] endorsed that her symptoms has stabilized and that she is ready for discharge to pursue psychiatric care on outpatient basis. It was then agreed upon that patient will follow-up care with [name] with [name] on 03/25/13 @ 2 :00 pm for counseling sessions and with Dr. Evelene Miller on 04/10/13 @ 1:45 PM for routine psychiatric care and medication management. The address, date, time and contact information for this clinic provided for patient in writing.  Upon discharge, [name] adamantly denies any suicidal, homicidal ideations, auditory, visual hallucinations,  paranoia and or delusional thoughts. She was provided with 14 days worth supply samples of her [facility] discharge medications. She left [facility] with all personal belongings via personal arranged transport in no  apparent distress.  Consults:  None  Significant Diagnostic Studies:  labs: CBC with diff, CMP, UDS, toxicology tests, U/A  Discharge Vitals:   Blood pressure 140/92, pulse 96, temperature 97.1 F (36.2 C), temperature source Oral, resp. rate 16, height 4\' 9"  (1.448 m), weight 78.109 kg (172 lb 3.2 oz), last menstrual period 03/13/2013. Body mass index is 37.25 kg/(m^2). Lab Results:   Results for orders placed during the hospital encounter of 03/16/13 (from the past 72 hour(s))  GLUCOSE, CAPILLARY     Status: Abnormal   Collection Time    03/18/13 12:00 PM      Result Value Range   Glucose-Capillary 67 (*) 70 - 99 mg/dL  GLUCOSE, CAPILLARY     Status: None   Collection Time    03/18/13 12:44 PM      Result Value Range   Glucose-Capillary 98  70 - 99 mg/dL  GLUCOSE, CAPILLARY     Status: Abnormal   Collection Time    03/18/13  4:53 PM      Result Value Range   Glucose-Capillary 104 (*) 70 - 99 mg/dL  GLUCOSE, CAPILLARY     Status: Abnormal   Collection Time    03/18/13 10:01 PM      Result Value Range   Glucose-Capillary 106 (*) 70 - 99 mg/dL  GLUCOSE, CAPILLARY     Status: Abnormal   Collection Time    03/19/13  6:49 AM      Result Value Range   Glucose-Capillary 107 (*) 70 - 99 mg/dL   Comment 1 Notify RN    GLUCOSE, CAPILLARY     Status: None   Collection Time    03/19/13 11:59 AM      Result Value Range   Glucose-Capillary 70  70 - 99 mg/dL  GLUCOSE, CAPILLARY     Status: Abnormal   Collection Time    03/19/13  5:34 PM      Result Value Range   Glucose-Capillary 128 (*) 70 - 99 mg/dL  GLUCOSE, CAPILLARY     Status: Abnormal   Collection Time    03/19/13  9:22 PM      Result Value Range   Glucose-Capillary 123 (*) 70 - 99 mg/dL  GLUCOSE, CAPILLARY     Status: Abnormal   Collection Time    03/20/13  6:04 AM      Result Value Range   Glucose-Capillary 103 (*) 70 - 99 mg/dL  GLUCOSE, CAPILLARY     Status: Abnormal   Collection Time    03/20/13 12:16 PM       Result Value Range   Glucose-Capillary 114 (*) 70 - 99 mg/dL  GLUCOSE, CAPILLARY     Status: Abnormal   Collection Time    03/20/13  5:19 PM      Result Value Range   Glucose-Capillary 110 (*) 70 - 99 mg/dL  GLUCOSE, CAPILLARY     Status: Abnormal   Collection Time    03/20/13  8:36 PM      Result Value Range   Glucose-Capillary 108 (*) 70 - 99 mg/dL   Comment 1 Notify RN    GLUCOSE, CAPILLARY     Status: Abnormal   Collection Time    03/21/13  6:16 AM      Result Value Range  Glucose-Capillary 105 (*) 70 - 99 mg/dL    Physical Findings: AIMS: Facial and Oral Movements Muscles of Facial Expression: None, normal Lips and Perioral Area: None, normal Jaw: None, normal Tongue: None, normal,Extremity Movements Upper (arms, wrists, hands, fingers): None, normal Lower (legs, knees, ankles, toes): None, normal, Trunk Movements Neck, shoulders, hips: None, normal, Overall Severity Severity of abnormal movements (highest score from questions above): None, normal Incapacitation due to abnormal movements: None, normal Patient's awareness of abnormal movements (rate only patient's report): No Awareness, Dental Status Current problems with teeth and/or dentures?: No Does patient usually wear dentures?: No  CIWA:    COWS:     Psychiatric Specialty Exam: See Psychiatric Specialty Exam and Suicide Risk Assessment completed by Attending Physician prior to discharge.  Discharge destination:  Home  Is patient on multiple antipsychotic therapies at discharge:  No   Has Patient had three or more failed trials of antipsychotic monotherapy by history:  No  Recommended Plan for Multiple Antipsychotic Therapies: NA     Medication List    STOP taking these medications       MSM 1000 MG Caps      TAKE these medications     Indication   asenapine 5 MG Subl  Commonly known as:  SAPHRIS  Place 2 tablets (10 mg total) under the tongue at bedtime. For mood control   Indication:   Manic-Depression     aspirin 81 MG tablet  Take 1 tablet (81 mg total) by mouth daily. For heart health   Indication:  Blood thinner for health     cloNIDine 0.1 mg/24hr patch  Commonly known as:  CATAPRES - Dosed in mg/24 hr  Place 30 patches (3 mg total) onto the skin once a week. For high blood pressure control   Indication:  High Blood Pressure     divalproex 500 MG 24 hr tablet  Commonly known as:  DEPAKOTE ER  Take 2 tablets (1,000 mg total) by mouth at bedtime. For mood stabilization   Indication:  Depressive Phase of Manic-Depression, Manic Phase of Manic-Depression     hydrochlorothiazide 25 MG tablet  Commonly known as:  HYDRODIURIL  Take 1 tablet (25 mg total) by mouth daily. For high blood pressure   Indication:  High Blood Pressure     lamoTRIgine 100 MG tablet  Commonly known as:  LAMICTAL  Take 1 tablet (100 mg total) by mouth daily. For mood stabilization   Indication:  Manic-Depression     lisinopril 2.5 MG tablet  Commonly known as:  PRINIVIL,ZESTRIL  Take 1 tablet (2.5 mg total) by mouth daily. For high blood pressure conrtrol   Indication:  High Blood Pressure     metFORMIN 1000 MG tablet  Commonly known as:  GLUCOPHAGE  Take 1 tablet (1,000 mg total) by mouth daily with breakfast. For high blood sugar control   Indication:  Type 2 Diabetes     potassium chloride 10 MEQ tablet  Commonly known as:  K-DUR,KLOR-CON  Take 1 tablet (10 mEq total) by mouth daily. For low potassium levels   Indication:  Low Amount of Potassium in the Blood     pravastatin 80 MG tablet  Commonly known as:  PRAVACHOL  Take 1 tablet (80 mg total) by mouth daily. For high cholesterol control   Indication:  Inherited Heterozygous Hypercholesterolemia, Type II A Hyperlipidemia     traZODone 50 MG tablet  Commonly known as:  DESYREL  Take 1 tablet (50 mg total) by mouth  at bedtime as needed for sleep (May repeat x1). For depression/sleep   Indication:  Trouble Sleeping, Major  Depressive Disorder     vitamin B-12 1000 MCG tablet  Commonly known as:  CYANOCOBALAMIN  Take 1 tablet (1,000 mcg total) by mouth daily. For Vitamin B-12 deficiency   Indication:  Inadequate Vitamin B12           Follow-up Information   Follow up with [name], [name] On 03/25/2013. (You are scheduled with [name] on Tuesday, March 25, 2013 at 2:00 PM)    Contact information:   [address] [place], [place]   [address]  [phone]      Follow up with [facility] On 04/10/2013. (You are scheduled with Dr. Evelene Miller on Thursday, April 10, 2013 at 1:45 PM)    Contact information:   [address] [place], [place]  [address]  [phone]     Follow-up recommendations: Activity:  As tolerated Diet: As recommended by your primary care doctor. Keep all scheduled follow-up appointments as recommended.  Comments: Take all your medications as prescribed by your mental healthcare provider. Report any adverse effects and or reactions from your medicines to your outpatient provider promptly. Patient is instructed and cautioned to not engage in alcohol and or illegal drug use while on prescription medicines. In the event of worsening symptoms, patient is instructed to call the crisis hotline, 911 and or go to the nearest ED for appropriate evaluation and treatment of symptoms. Follow-up with your primary care provider for your other medical issues, concerns and or health care needs.     Total Discharge Time:  Greater than 30 minutes.  Signed: Armandina Stammer I, PMHNP-BC 03/21/2013, 10:10 AM

## 2013-03-21 NOTE — Progress Notes (Signed)
Patient ID: [name], female   DOB: 03-18-1971, 42 y.o.   MRN: [id number] Patient denies si/hi/avh. Pt verbalizes understanding of discharge instructions, prescriptions and follow up appts. Pt signed for their belongings from locker. She was given a rtn to work Physicist, medical.  Pt was walked to the lobby and was transported by family/husband, kenneth.

## 2013-03-21 NOTE — [facility] Suicide Risk Assessment (Signed)
Suicide Risk Assessment  Discharge Assessment     Demographic Factors:  female  Mental Status Per Nursing Assessment::   On Admission:  NA  Current Mental Status by Physician: patient denies suicidal ideation, intent or plan  Loss Factors: NA  Historical Factors: NA  Risk Reduction Factors:   Responsible for children under 43 years of age, Sense of responsibility to family, Religious beliefs about death, Employed, Living with another person, especially a relative, Positive social support and Positive therapeutic relationship  Continued Clinical Symptoms:  Bipolar Disorder: most recent episode manic-resolving  Cognitive Features That Contribute To Risk:  Polarized thinking    Suicide Risk:  Minimal: No identifiable suicidal ideation.  Patients presenting with no risk factors but with morbid ruminations; may be classified as minimal risk based on the severity of the depressive symptoms  Discharge Diagnoses:   AXIS I:  Bipolar I disorder most recent episode manic  AXIS II:  Deferred AXIS III:   Past Medical History  Diagnosis Date  . Hypertension   . Diabetes mellitus without complication   . Hypercholesteremia    AXIS IV:  other psychosocial or environmental problems, problems related to social environment and medication non-compliant AXIS V:  61-70 mild symptoms  Plan Of Care/Follow-up recommendations:  Activity:  as tolerated Diet:  heathy Tests:  Valproic acid level Other:  patient to keep her after care appointment.  Is patient on multiple antipsychotic therapies at discharge:  No   Has Patient had three or more failed trials of antipsychotic monotherapy by history:  No  Recommended Plan for Multiple Antipsychotic Therapies: N/A  Noheli Melder,MD 03/21/2013, 9:41 AM

## 2013-03-25 LAB — VITAMIN B1: Vitamin B1 (Thiamine): 12 nmol/L (ref 8–30)

## 2013-03-25 NOTE — Discharge Summary (Signed)
Seen and agreed. Saleen Peden, MD 

## 2013-03-26 NOTE — Progress Notes (Signed)
Patient Discharge Instructions:  After Visit Summary (AVS):   Faxed to:  03/26/13 Discharge Summary Note:   Faxed to:  03/26/13 Psychiatric Admission Assessment Note:   Faxed to:  03/26/13 Suicide Risk Assessment - Discharge Assessment:   Faxed to:  03/26/13 Faxed/Sent to the Next Level Care provider:  03/26/13 Faxed to [facility] @ [phone]  Jerelene Redden, 03/26/2013, 4:02 PM

## 2013-03-31 ENCOUNTER — Emergency Department (HOSPITAL_COMMUNITY): Payer: 59

## 2013-03-31 ENCOUNTER — Encounter (HOSPITAL_COMMUNITY): Payer: Self-pay | Admitting: Radiology

## 2013-03-31 ENCOUNTER — Inpatient Hospital Stay (HOSPITAL_COMMUNITY)
Admission: EM | Admit: 2013-03-31 | Discharge: 2013-04-08 | DRG: 193 | Disposition: A | Discharge status: Home Health | Payer: 59 | Attending: Pulmonary Disease | Admitting: Pulmonary Disease

## 2013-03-31 DIAGNOSIS — D649 Anemia, unspecified: Secondary | ICD-10-CM

## 2013-03-31 DIAGNOSIS — J189 Pneumonia, unspecified organism: Secondary | ICD-10-CM

## 2013-03-31 DIAGNOSIS — I5033 Acute on chronic diastolic (congestive) heart failure: Secondary | ICD-10-CM

## 2013-03-31 DIAGNOSIS — J9601 Acute respiratory failure with hypoxia: Secondary | ICD-10-CM

## 2013-03-31 DIAGNOSIS — E559 Vitamin D deficiency, unspecified: Secondary | ICD-10-CM

## 2013-03-31 DIAGNOSIS — R748 Abnormal levels of other serum enzymes: Secondary | ICD-10-CM | POA: Diagnosis present

## 2013-03-31 DIAGNOSIS — F3110 Bipolar disorder, current episode manic without psychotic features, unspecified: Secondary | ICD-10-CM

## 2013-03-31 DIAGNOSIS — I5032 Chronic diastolic (congestive) heart failure: Secondary | ICD-10-CM | POA: Diagnosis not present

## 2013-03-31 DIAGNOSIS — Z982 Presence of cerebrospinal fluid drainage device: Secondary | ICD-10-CM

## 2013-03-31 DIAGNOSIS — R7989 Other specified abnormal findings of blood chemistry: Secondary | ICD-10-CM

## 2013-03-31 DIAGNOSIS — G4733 Obstructive sleep apnea (adult) (pediatric): Secondary | ICD-10-CM

## 2013-03-31 DIAGNOSIS — R9431 Abnormal electrocardiogram [ECG] [EKG]: Secondary | ICD-10-CM

## 2013-03-31 DIAGNOSIS — I10 Essential (primary) hypertension: Secondary | ICD-10-CM

## 2013-03-31 DIAGNOSIS — E662 Morbid (severe) obesity with alveolar hypoventilation: Secondary | ICD-10-CM | POA: Diagnosis present

## 2013-03-31 DIAGNOSIS — R0902 Hypoxemia: Secondary | ICD-10-CM | POA: Diagnosis present

## 2013-03-31 DIAGNOSIS — F319 Bipolar disorder, unspecified: Secondary | ICD-10-CM

## 2013-03-31 DIAGNOSIS — J9620 Acute and chronic respiratory failure, unspecified whether with hypoxia or hypercapnia: Secondary | ICD-10-CM | POA: Diagnosis present

## 2013-03-31 DIAGNOSIS — I509 Heart failure, unspecified: Secondary | ICD-10-CM | POA: Diagnosis present

## 2013-03-31 DIAGNOSIS — E119 Type 2 diabetes mellitus without complications: Secondary | ICD-10-CM

## 2013-03-31 DIAGNOSIS — E785 Hyperlipidemia, unspecified: Secondary | ICD-10-CM

## 2013-03-31 DIAGNOSIS — R0600 Dyspnea, unspecified: Secondary | ICD-10-CM

## 2013-03-31 DIAGNOSIS — D72829 Elevated white blood cell count, unspecified: Secondary | ICD-10-CM

## 2013-03-31 DIAGNOSIS — Z6839 Body mass index (BMI) 39.0-39.9, adult: Secondary | ICD-10-CM

## 2013-03-31 DIAGNOSIS — M6282 Rhabdomyolysis: Secondary | ICD-10-CM

## 2013-03-31 DIAGNOSIS — D509 Iron deficiency anemia, unspecified: Secondary | ICD-10-CM | POA: Diagnosis present

## 2013-03-31 DIAGNOSIS — R778 Other specified abnormalities of plasma proteins: Secondary | ICD-10-CM | POA: Diagnosis present

## 2013-03-31 DIAGNOSIS — J9600 Acute respiratory failure, unspecified whether with hypoxia or hypercapnia: Secondary | ICD-10-CM

## 2013-03-31 HISTORY — DX: Vitamin D deficiency, unspecified: E55.9

## 2013-03-31 HISTORY — DX: Rhabdomyolysis: M62.82

## 2013-03-31 HISTORY — DX: Hyperlipidemia, unspecified: E78.5

## 2013-03-31 HISTORY — DX: Bipolar disorder, unspecified: F31.9

## 2013-03-31 HISTORY — DX: Anemia, unspecified: D64.9

## 2013-03-31 LAB — POCT I-STAT, CHEM 8
BUN: 26 mg/dL — ABNORMAL HIGH (ref 6–23)
Calcium, Ion: 1.1 mmol/L — ABNORMAL LOW (ref 1.12–1.23)
Chloride: 103 mEq/L (ref 96–112)
Glucose, Bld: 153 mg/dL — ABNORMAL HIGH (ref 70–99)
TCO2: 29 mmol/L (ref 0–100)

## 2013-03-31 LAB — CBC WITH DIFFERENTIAL/PLATELET
Eosinophils Absolute: 0.2 10*3/uL (ref 0.0–0.7)
Lymphs Abs: 2.7 10*3/uL (ref 0.7–4.0)
MCH: 20.3 pg — ABNORMAL LOW (ref 26.0–34.0)
MCHC: 27.1 g/dL — ABNORMAL LOW (ref 30.0–36.0)
MCV: 74.9 fL — ABNORMAL LOW (ref 78.0–100.0)
Monocytes Absolute: 1.5 10*3/uL — ABNORMAL HIGH (ref 0.1–1.0)
Neutrophils Relative %: 77 % (ref 43–77)
Platelets: 595 10*3/uL — ABNORMAL HIGH (ref 150–400)

## 2013-03-31 LAB — TROPONIN I: Troponin I: 0.31 ng/mL (ref <0.30)

## 2013-03-31 LAB — BLOOD GAS, ARTERIAL
Acid-Base Excess: 3.3 mmol/L — ABNORMAL HIGH (ref 0.0–2.0)
Drawn by: 244801
O2 Content: 4 L/min
O2 Saturation: 94.3 %
TCO2: 28.3 mmol/L (ref 0–100)
pCO2 arterial: 59 mmHg (ref 35.0–45.0)
pO2, Arterial: 85.4 mmHg (ref 80.0–100.0)

## 2013-03-31 LAB — MAGNESIUM: Magnesium: 2.2 mg/dL (ref 1.5–2.5)

## 2013-03-31 LAB — PROTIME-INR: Prothrombin Time: 14.2 seconds (ref 11.6–15.2)

## 2013-03-31 MED ORDER — ADULT MULTIVITAMIN W/MINERALS CH
1.0000 | ORAL_TABLET | Freq: Every day | ORAL | Status: DC
  Administered 2013-04-01 – 2013-04-04 (×4): 1 via ORAL
  Filled 2013-03-31 (×4): qty 1

## 2013-03-31 MED ORDER — IPRATROPIUM BROMIDE 0.02 % IN SOLN: 0.5000 mg | RESPIRATORY_TRACT | Status: DC | PRN

## 2013-03-31 MED ORDER — ACETAMINOPHEN 325 MG PO TABS
650.0000 mg | ORAL_TABLET | ORAL | Status: DC | PRN
  Administered 2013-04-01 – 2013-04-08 (×4): 650 mg via ORAL
  Filled 2013-03-31 (×4): qty 2

## 2013-03-31 MED ORDER — IPRATROPIUM BROMIDE 0.02 % IN SOLN
0.5000 mg | Freq: Four times a day (QID) | RESPIRATORY_TRACT | Status: DC
  Administered 2013-03-31: 0.5 mg via RESPIRATORY_TRACT
  Filled 2013-03-31: qty 2.5

## 2013-03-31 MED ORDER — ASPIRIN 81 MG PO CHEW
324.0000 mg | CHEWABLE_TABLET | Freq: Once | ORAL | Status: AC
  Administered 2013-03-31: 324 mg via ORAL
  Filled 2013-03-31: qty 1

## 2013-03-31 MED ORDER — ALBUTEROL SULFATE (5 MG/ML) 0.5% IN NEBU
2.5000 mg | INHALATION_SOLUTION | Freq: Three times a day (TID) | RESPIRATORY_TRACT | Status: DC
  Administered 2013-04-01 – 2013-04-08 (×21): 2.5 mg via RESPIRATORY_TRACT
  Filled 2013-03-31 (×23): qty 0.5

## 2013-03-31 MED ORDER — LISINOPRIL 20 MG PO TABS
20.0000 mg | ORAL_TABLET | Freq: Every day | ORAL | Status: DC
  Administered 2013-03-31 – 2013-04-07 (×8): 20 mg via ORAL
  Filled 2013-03-31 (×8): qty 1

## 2013-03-31 MED ORDER — LEVOFLOXACIN IN D5W 750 MG/150ML IV SOLN: 750.0000 mg | INTRAVENOUS | Status: DC

## 2013-03-31 MED ORDER — DEXTROSE 5 % IV SOLN: 1.0000 g | Freq: Three times a day (TID) | INTRAVENOUS | Status: DC

## 2013-03-31 MED ORDER — IPRATROPIUM BROMIDE 0.02 % IN SOLN: 0.5000 mg | Freq: Three times a day (TID) | RESPIRATORY_TRACT | Status: DC

## 2013-03-31 MED ORDER — VANCOMYCIN HCL IN DEXTROSE 1-5 GM/200ML-% IV SOLN
1000.0000 mg | Freq: Two times a day (BID) | INTRAVENOUS | Status: DC
  Administered 2013-04-01 – 2013-04-02 (×4): 1000 mg via INTRAVENOUS
  Filled 2013-03-31 (×5): qty 200

## 2013-03-31 MED ORDER — ASPIRIN 81 MG PO TABS: 81.0000 mg | ORAL_TABLET | Freq: Every day | ORAL | Status: DC

## 2013-03-31 MED ORDER — DIVALPROEX SODIUM ER 500 MG PO TB24
500.0000 mg | ORAL_TABLET | Freq: Every day | ORAL | Status: DC
  Administered 2013-04-01 – 2013-04-08 (×8): 500 mg via ORAL
  Filled 2013-03-31 (×8): qty 1

## 2013-03-31 MED ORDER — MORPHINE SULFATE 4 MG/ML IJ SOLN
4.0000 mg | Freq: Once | INTRAMUSCULAR | Status: AC
  Administered 2013-03-31: 4 mg via INTRAVENOUS
  Filled 2013-03-31: qty 1

## 2013-03-31 MED ORDER — VITAMIN B-12 1000 MCG PO TABS
1000.0000 ug | ORAL_TABLET | Freq: Every day | ORAL | Status: DC
  Administered 2013-04-01 – 2013-04-08 (×8): 1000 ug via ORAL
  Filled 2013-03-31 (×8): qty 1

## 2013-03-31 MED ORDER — BENZONATATE 100 MG PO CAPS
200.0000 mg | ORAL_CAPSULE | Freq: Three times a day (TID) | ORAL | Status: DC
  Administered 2013-03-31: 200 mg via ORAL
  Filled 2013-03-31 (×4): qty 2

## 2013-03-31 MED ORDER — BENZONATATE 100 MG PO CAPS: 200.0000 mg | ORAL_CAPSULE | Freq: Three times a day (TID) | ORAL | Status: DC

## 2013-03-31 MED ORDER — METOPROLOL TARTRATE 12.5 MG HALF TABLET
12.5000 mg | ORAL_TABLET | Freq: Two times a day (BID) | ORAL | Status: DC
  Administered 2013-03-31 – 2013-04-04 (×7): 12.5 mg via ORAL
  Filled 2013-03-31 (×9): qty 1

## 2013-03-31 MED ORDER — ALBUTEROL SULFATE (5 MG/ML) 0.5% IN NEBU
2.5000 mg | INHALATION_SOLUTION | RESPIRATORY_TRACT | Status: DC | PRN
  Administered 2013-04-01 – 2013-04-04 (×2): 2.5 mg via RESPIRATORY_TRACT
  Filled 2013-03-31 (×2): qty 0.5

## 2013-03-31 MED ORDER — LEVOFLOXACIN IN D5W 750 MG/150ML IV SOLN
750.0000 mg | INTRAVENOUS | Status: DC
  Administered 2013-03-31 – 2013-04-04 (×5): 750 mg via INTRAVENOUS
  Filled 2013-03-31 (×5): qty 150

## 2013-03-31 MED ORDER — ASENAPINE MALEATE 10 MG SL SUBL: 1.0000 | SUBLINGUAL_TABLET | Freq: Every evening | SUBLINGUAL | Status: DC | PRN

## 2013-03-31 MED ORDER — VANCOMYCIN HCL IN DEXTROSE 1-5 GM/200ML-% IV SOLN: 1000.0000 mg | Freq: Two times a day (BID) | INTRAVENOUS | Status: DC

## 2013-03-31 MED ORDER — SIMVASTATIN 40 MG PO TABS
40.0000 mg | ORAL_TABLET | Freq: Every day | ORAL | Status: DC
  Administered 2013-04-01 – 2013-04-08 (×8): 40 mg via ORAL
  Filled 2013-03-31 (×8): qty 1

## 2013-03-31 MED ORDER — IOHEXOL 350 MG/ML SOLN
100.0000 mL | Freq: Once | INTRAVENOUS | Status: AC | PRN
  Administered 2013-03-31: 100 mL via INTRAVENOUS

## 2013-03-31 MED ORDER — ONDANSETRON HCL 4 MG/2ML IJ SOLN: 4.0000 mg | Freq: Four times a day (QID) | INTRAMUSCULAR | Status: DC | PRN

## 2013-03-31 MED ORDER — PANTOPRAZOLE SODIUM 40 MG PO TBEC
40.0000 mg | DELAYED_RELEASE_TABLET | Freq: Every day | ORAL | Status: DC
  Administered 2013-04-01 – 2013-04-04 (×4): 40 mg via ORAL
  Filled 2013-03-31 (×4): qty 1

## 2013-03-31 MED ORDER — NITROGLYCERIN 0.4 MG SL SUBL: 0.4000 mg | SUBLINGUAL_TABLET | SUBLINGUAL | Status: DC | PRN

## 2013-03-31 MED ORDER — ASENAPINE MALEATE 5 MG SL SUBL
10.0000 mg | SUBLINGUAL_TABLET | Freq: Every evening | SUBLINGUAL | Status: DC | PRN
  Filled 2013-03-31: qty 2

## 2013-03-31 MED ORDER — SODIUM CHLORIDE 0.9 % IV SOLN
INTRAVENOUS | Status: AC
  Administered 2013-03-31: 18:00:00 via INTRAVENOUS

## 2013-03-31 MED ORDER — LAMOTRIGINE 100 MG PO TABS
100.0000 mg | ORAL_TABLET | Freq: Every day | ORAL | Status: DC
  Administered 2013-03-31 – 2013-04-08 (×9): 100 mg via ORAL
  Filled 2013-03-31 (×9): qty 1

## 2013-03-31 MED ORDER — DEXTROSE 5 % IV SOLN
1.0000 g | Freq: Three times a day (TID) | INTRAVENOUS | Status: DC
  Administered 2013-03-31 – 2013-04-03 (×8): 1 g via INTRAVENOUS
  Filled 2013-03-31 (×9): qty 1

## 2013-03-31 MED ORDER — ASPIRIN EC 81 MG PO TBEC
81.0000 mg | DELAYED_RELEASE_TABLET | Freq: Every day | ORAL | Status: DC
  Administered 2013-03-31 – 2013-04-08 (×9): 81 mg via ORAL
  Filled 2013-03-31 (×9): qty 1

## 2013-03-31 MED ORDER — NITROGLYCERIN 0.3 MG SL SUBL: 0.3000 mg | SUBLINGUAL_TABLET | SUBLINGUAL | Status: DC | PRN

## 2013-03-31 MED ORDER — VANCOMYCIN HCL IN DEXTROSE 1-5 GM/200ML-% IV SOLN
1000.0000 mg | Freq: Once | INTRAVENOUS | Status: AC
  Administered 2013-03-31: 1000 mg via INTRAVENOUS
  Filled 2013-03-31: qty 200

## 2013-03-31 MED ORDER — BIOTENE DRY MOUTH MT LIQD
15.0000 mL | Freq: Two times a day (BID) | OROMUCOSAL | Status: DC
  Administered 2013-03-31 – 2013-04-03 (×6): 15 mL via OROMUCOSAL

## 2013-03-31 MED ORDER — ALBUTEROL SULFATE (5 MG/ML) 0.5% IN NEBU
2.5000 mg | INHALATION_SOLUTION | Freq: Four times a day (QID) | RESPIRATORY_TRACT | Status: DC
  Administered 2013-03-31: 2.5 mg via RESPIRATORY_TRACT
  Filled 2013-03-31: qty 0.5

## 2013-03-31 MED ORDER — ASPIRIN 81 MG PO CHEW
CHEWABLE_TABLET | ORAL | Status: AC
  Filled 2013-03-31: qty 3

## 2013-03-31 MED ORDER — ENOXAPARIN SODIUM 40 MG/0.4ML ~~LOC~~ SOLN
40.0000 mg | SUBCUTANEOUS | Status: DC
  Administered 2013-03-31 – 2013-04-07 (×8): 40 mg via SUBCUTANEOUS
  Filled 2013-03-31 (×9): qty 0.4

## 2013-03-31 MED ORDER — TRAMADOL HCL 50 MG PO TABS
50.0000 mg | ORAL_TABLET | Freq: Two times a day (BID) | ORAL | Status: DC | PRN
  Administered 2013-04-03 – 2013-04-05 (×2): 50 mg via ORAL
  Filled 2013-03-31 (×2): qty 1

## 2013-03-31 MED ORDER — INSULIN ASPART 100 UNIT/ML ~~LOC~~ SOLN
0.0000 [IU] | Freq: Three times a day (TID) | SUBCUTANEOUS | Status: DC
  Administered 2013-04-01 (×2): 2 [IU] via SUBCUTANEOUS
  Administered 2013-04-02 (×2): 3 [IU] via SUBCUTANEOUS
  Administered 2013-04-02: 2 [IU] via SUBCUTANEOUS
  Administered 2013-04-03: 3 [IU] via SUBCUTANEOUS
  Administered 2013-04-03: 2 [IU] via SUBCUTANEOUS
  Administered 2013-04-04: 3 [IU] via SUBCUTANEOUS
  Administered 2013-04-04: 2 [IU] via SUBCUTANEOUS
  Administered 2013-04-05: 3 [IU] via SUBCUTANEOUS
  Administered 2013-04-06 – 2013-04-08 (×3): 2 [IU] via SUBCUTANEOUS

## 2013-03-31 NOTE — Consult Note (Signed)
CARDIOLOGY CONSULT NOTE  Patient ID: Deetta Siegmann, MRN: [id number], DOB/AGE: 02-28-1971 42 y.o. Admit date: 03/31/2013 Date of Consult: 03/31/2013  Primary Physician: Juline Patch, MD Primary Cardiologist: new  Chief Complaint: cough + tn   HPI Jora Galluzzo is a 42 y.o. female  with a diagnosis of pneumonia. She was found to have an abnormal electrocardiogram with diffuse T-wave inversions including the inferior leads and V2-V6 with a borderline positive troponin of 0.31.  She has a-four-week history of progressive dyspnea mostly with exertion. It has been accompanied by a cough productive of clear sputum. She has had no fevers. She's had no recumbent dyspnea; there has been no peripheral edema. She denies exertional chest discomfort.  She was treated with her PCP for presumptive diagnosis of pneumonia/asthma with antibiotics which she did not take as well as nebulizers and steroids. As noted, her white count on presentation was 19,000.  She was found to have significant hypoxemia at her PCP and was sent to the emergency room. Evaluation includes the aforementioned white count, notably in the wake of steroids, a chest x-ray demonstrating bilateral infiltrates with some cephalization, and a CT scan negative for pulmonary embolism but showing bilateral infiltrates.  Cardiac risk factors are notable for hypertension hyperlipidemia diabetes; she does not smoke. Family history is notable for hypertension but no coronary disease      Past Medical History  Diagnosis Date  . Hypertension   . Diabetes mellitus without complication   . Hypercholesteremia   . HTN (hypertension) 03/31/2013  . Hyperlipidemia 03/31/2013  . Bipolar disorder 03/31/2013  . Diabetes 03/31/2013  . Vitamin D deficiency 03/31/2013  . Anemia 03/31/2013  . Rhabdomyolysis 03/31/2013    [facility] secondary to abilify      Surgical History:  Past Surgical History  Procedure Laterality Date  . Ventriculoperitoneal shunt      . Cesarean section    . Cyst excision       Home Meds: Prior to Admission medications   Medication Sig Start Date End Date Taking? Authorizing Provider  Asenapine Maleate 10 MG SUBL Place 1 tablet under the tongue at bedtime as needed (insomnia).    Yes Historical Provider, MD  aspirin 81 MG tablet Take 1 tablet (81 mg total) by mouth daily. For heart health 03/21/13  Yes Sanjuana Kava, NP  benzonatate (TESSALON) 200 MG capsule Take 200 mg by mouth 3 (three) times daily as needed for cough.   Yes Historical Provider, MD  cloNIDine (CATAPRES - DOSED IN MG/24 HR) 0.1 mg/24hr patch Place 1 patch onto the skin once a week. Applied for the first time on 03/30/13.   Yes Historical Provider, MD  divalproex (DEPAKOTE ER) 500 MG 24 hr tablet Take 500 mg by mouth daily.   Yes Historical Provider, MD  hydrochlorothiazide (HYDRODIURIL) 25 MG tablet Take 1 tablet (25 mg total) by mouth daily. For high blood pressure 03/21/13  Yes Sanjuana Kava, NP  lamoTRIgine (LAMICTAL) 200 MG tablet Take 200 mg by mouth daily. For mood stabilization.   Yes Historical Provider, MD  lisinopril (PRINIVIL,ZESTRIL) 5 MG tablet Take 20 mg by mouth daily.    Yes Historical Provider, MD  metFORMIN (GLUCOPHAGE) 1000 MG tablet Take 1 tablet (1,000 mg total) by mouth daily with breakfast. For high blood sugar control 03/21/13  Yes Sanjuana Kava, NP  Methylsulfonylmethane (MSM PO) Take 2 tablets by mouth daily.    Yes Historical Provider, MD  Multiple Vitamin (MULTIVITAMIN WITH MINERALS) TABS Take 1 tablet by  mouth daily.   Yes Historical Provider, MD  pravastatin (PRAVACHOL) 80 MG tablet Take 1 tablet (80 mg total) by mouth daily. For high cholesterol control 03/21/13  Yes Sanjuana Kava, NP  traMADol (ULTRAM) 50 MG tablet Take 50 mg by mouth 2 (two) times daily as needed (migraine headache.).    Yes Historical Provider, MD  vitamin B-12 (CYANOCOBALAMIN) 1000 MCG tablet Take 2,000 mcg by mouth daily. For Vitamin B-12 deficiency 03/21/13  Yes  Sanjuana Kava, NP    Inpatient Medications:  . sodium chloride   Intravenous STAT  . albuterol  2.5 mg Nebulization Q6H  . antiseptic oral rinse  15 mL Mouth Rinse BID  . aspirin EC  81 mg Oral Daily  . benzonatate  200 mg Oral TID  . ceFEPime (MAXIPIME) IV  1 g Intravenous Q8H  . [START ON 04/01/2013] divalproex  500 mg Oral Daily  . enoxaparin (LOVENOX) injection  40 mg Subcutaneous Q24H  . [START ON 04/01/2013] insulin aspart  0-15 Units Subcutaneous TID WC  . ipratropium  0.5 mg Nebulization Q6H  . lamoTRIgine  100 mg Oral Daily  . levofloxacin  750 mg Intravenous Q24H  . lisinopril  20 mg Oral Daily  . metoprolol tartrate  12.5 mg Oral BID  . [START ON 04/01/2013] multivitamin with minerals  1 tablet Oral Daily  . [START ON 04/01/2013] pantoprazole  40 mg Oral Daily  . [START ON 04/01/2013] simvastatin  40 mg Oral q1800  . vancomycin  1,000 mg Intravenous Once  . [START ON 04/01/2013] vancomycin  1,000 mg Intravenous Q12H  . [START ON 04/01/2013] vitamin B-12  1,000 mcg Oral Daily    Allergies:  Allergies  Allergen Reactions  . Abilify (Aripiprazole)     hyperglycemia    History   Social History  . Marital Status: Married    Spouse Name: N/A    Number of Children: N/A  . Years of Education: N/A   Occupational History  . Not on file.   Social History Main Topics  . Smoking status: Never Smoker   . Smokeless tobacco: Not on file  . Alcohol Use: No  . Drug Use: No  . Sexually Active: Not on file   Other Topics Concern  . Not on file   Social History Narrative  . No narrative on file     History reviewed. No pertinent family history.   ROS:  Please see the history of present illness.     All other systems reviewed and negative.    Physical Exam:   Blood pressure 152/96, pulse 95, temperature 98.4 F (36.9 C), temperature source Oral, resp. rate 20, height 4\' 9"  (1.448 m), weight 172 lb 3 oz (78.104 kg), last menstrual period 03/13/2013, SpO2  98.00%. General: Well developed, well nourished african [place] age appearing female in no acute distress. Head: Normocephalic, atraumatic, sclera non-icteric, no xanthomas, nares are without discharge. EENT: normal  Lymph Nodes:  none Neck: Negative for carotid bruits. JVD 8-9 cm Back:without scoliosis kyphosis Lungs: Clear bilaterally to auscultation without wheezes, rales, or rhonchi. Breathing is unlabored. Heart: RRR with S1 S2. No   murmur . No rubs, or gallops appreciated. Abdomen: Soft, non-tender, non-distended with normoactive bowel sounds. No hepatomegaly. No rebound/guarding. No obvious abdominal masses. Msk:  Strength and tone appear normal for age. Extremities: No clubbing or cyanosis. No edema.  Distal pedal pulses are 2+ and equal bilaterally. Skin: Warm and Dry Neuro: Alert and oriented X 3. CN III-XII intact  Grossly normal sensory and motor function . Psych:  Responds to questions appropriately with a normal affect.      Labs: Cardiac Enzymes  Recent Labs  03/31/13 1731  TROPONINI 0.31*   CBC Lab Results  Component Value Date   WBC 19.2* 03/31/2013   HGB 12.6 03/31/2013   HCT 37.0 03/31/2013   MCV 74.9* 03/31/2013   PLT 595* 03/31/2013   PROTIME: No results found for this basename: LABPROT, INR,  in the last 72 hours Chemistry  Recent Labs Lab 03/31/13 1200  NA 140  K 4.6  CL 103  BUN 26*  CREATININE 1.00  GLUCOSE 153*   Lipids No results found for this basename: CHOL, HDL, LDLCALC, TRIG   BNP No results found for this basename: probnp   Miscellaneous No results found for this basename: DDIMER    Radiology/Studies:  Dg Chest 2 View  03/31/2013  *RADIOLOGY REPORT*  Clinical Data: Cough and shortness of breath  CHEST - 2 VIEW  Comparison: 06/26/2006  Findings: The cardiac shadow is mildly enlarged.  Mild cephalization of the pulmonary vascularity is noted.  No focal infiltrate or sizable effusion is seen.  Findings consistent with a prior shunt  are noted.  IMPRESSION: Mild cephalization of the pulmonary vasculature.  No other focal abnormality is seen.   Original Report Authenticated By: Alcide Clever, M.D.    Ct Angio Chest Pe W/cm &/or Wo Cm  03/31/2013  *RADIOLOGY REPORT*  Clinical Data: Cough, shortness of breath, recent pneumonia, pulmonary embolism, past history hypertension, diabetes  CT ANGIOGRAPHY CHEST  Technique:  Multidetector CT imaging of the chest using the standard protocol during bolus administration of intravenous contrast. Multiplanar reconstructed images including MIPs were obtained and reviewed to evaluate the vascular anatomy.  Contrast: OMNIPAQUE IOHEXOL 350 MG/ML SOLN  Comparison: None  Findings: Aorta normal caliber without aneurysm or dissection. Dilated main pulmonary artery 3.4 x 3.5 cm diameter. Pulmonary arteries patent. No evidence of pulmonary embolism. No thoracic adenopathy. Enlargement of lateral segment left lobe liver. Remaining visualized portions of upper abdomen unremarkable. Scattered patchy infiltrates bilateral upper and lower lobes. No pleural or pericardial effusions. No acute osseous findings.  IMPRESSION: No evidence of pulmonary embolism. Scattered patchy bilateral pulmonary infiltrates. Dilated main pulmonary artery, which can be seen with pulmonary arterial hypertension.   Original Report Authenticated By: Ulyses Southward, M.D.     EKG:  Sinus rhythm at 88 Intervals 14/08/39 Axis LXX T-wave inversions 48F V2-V6   Assessment and Plan:   Principal Problem:   Acute respiratory failure with hypoxia Active Problems:   Bipolar I disorder, most recent episode (or current) manic   HCAP (healthcare-associated pneumonia)   Abnormal EKG   Leukocytosis   HTN (hypertension)   Hyperlipidemia   Diabetes   Vitamin D deficiency   Anemia   Elevated troponin  The patient presents with shortness of breath over the last 3 weeks, chest x-ray with bilateral infiltrates, elevated white count albeit in  the context of steroids, the presumptive diagnosis of pneumonia in the absence of a fever with an abnormal electrocardiogram.  It is certainly possible that an infectious process is responsible for her symptom complex including her abnormal troponin. Her hypertensive heart disease they will contribute to her echocardiogram; unfortunately we do not have access to an old one.  Recommendations #1-serial troponins 2-echocardiogram 3- she will need stress testing the timing of which will need to be determined based on with the diagnoses of pneumonia is solidified. #4-continue aspirin. Would  not use anticoagulation unless there is objective evidence for ischemia.  Thank you for the consultation   Sherryl Manges

## 2013-03-31 NOTE — ED Notes (Signed)
Per EMS pt from PCP [facility], pt has been having difficulty with breathing after a bout with Pneumonia last week. Pt had been on Antibiotic treatment and returned to PCP for check up but was worse today. Received low dose albuterol/atrovent at PCP and received 5 mg of Albuterol and 0.5 of Atrovent, 125 mg of solumedrol.  from EMS in route. Pt has 20g in right AC. BP 140/92, HR 89, resp 20, 100% on Nonrebreather, CBG 126.

## 2013-03-31 NOTE — ED Notes (Signed)
Attempted to ambulate patient. Patient began to desat to 77% before having the opportunity to even stand up. RN notified patient not ambulated.

## 2013-03-31 NOTE — H&P (Signed)
[facility] History and Physical  [name] AVW:[id number] DOB: 10/15/1971 DOA: 03/31/2013  Referring physician: Dr Devoria Albe PCP: Juline Patch, MD  Specialists: Psychiatry:Dr Evelene Croon  Chief Complaint: Hypoxia  HPI: [name] is a 42 y.o. female with history of diabetes, hypertension, hyperlipidemia, bipolar disorder, anemia who [facility] for her bipolar disorder presenting to the ED with a 2 week history of a productive cough of clear sputum him a one-week history of worsening shortness of breath, urinary incontinence and bowel incontinence secondary to cough and, wheezing and hypoxia. Patient had presented to PCPs office 3 days prior to admission with symptoms of a cough and shortness of breath. Patient states she was placed on Avelox and treated for pneumonia. Per PCPs office note 3 days prior to admission patient was started on Avelox given some nebulizer treatments as well as a shot of Depo-Medrol for asthma at tach as well. Patient stated that she missed one dose of Avelox and called full activity refill. Patient stated that due to her worsening shortness of breath presented to PCPs office on the day of admission on followup and was noted to have sats in the 70s on room air EMS was called and patient was subsequently brought to the ED. In the ED CBC done at a white count of 19.2 hemoglobin of 9.5 with a repeat of 12.6 platelet count of 595. I-STAT 8 had a BUN of 26 otherwise was unremarkable. Chest x-ray done was negative. Due to patient be sending to 70% on room at a CT angiogram of the chest was done which showed scattered patchy bilateral pulmonary infiltrates. Negative for PE. Patient was given a dose of IV Levaquin. We were called to admit the patient for further evaluation and management.  Review of Systems: The patient denies anorexia, fever, weight loss,, vision loss, decreased hearing, hoarseness, chest pain, syncope,  dyspnea on exertion, peripheral edema, balance deficits, hemoptysis, abdominal pain, melena, hematochezia, severe indigestion/heartburn, hematuria, incontinence, genital sores, muscle weakness, suspicious skin lesions, transient blindness, difficulty walking, depression, unusual weight change, abnormal bleeding, enlarged lymph nodes, angioedema, and breast masses.   Past Medical History  Diagnosis Date  . Hypertension   . Diabetes mellitus without complication   . Hypercholesteremia   . HTN (hypertension) 03/31/2013  . Hyperlipidemia 03/31/2013  . Bipolar disorder 03/31/2013  . Diabetes 03/31/2013  . Vitamin D deficiency 03/31/2013  . Anemia 03/31/2013  . Rhabdomyolysis 03/31/2013    [facility] secondary to abilify   Past Surgical History  Procedure Laterality Date  . Ventriculoperitoneal shunt    . Cesarean section    . Cyst excision     Social History:  reports that she has never smoked. She does not have any smokeless tobacco history on file. She reports that she does not drink alcohol or use illicit drugs.  Allergies  Allergen Reactions  . Abilify (Aripiprazole)     hyperglycemia    History reviewed. No pertinent family history.   Prior to Admission medications   Medication Sig Start Date End Date Taking? Authorizing Provider  Asenapine Maleate 10 MG SUBL Place 1 tablet under the tongue at bedtime as needed (insomnia).    Yes Historical Provider, MD  aspirin 81 MG tablet Take 1 tablet (81 mg total) by mouth daily. For heart health 03/21/13  Yes Sanjuana Kava, NP  benzonatate (TESSALON) 200 MG capsule Take 200 mg by mouth 3 (three) times daily as needed for cough.   Yes Historical Provider, MD  cloNIDine (CATAPRES - DOSED IN MG/24 HR) 0.1 mg/24hr patch Place 1 patch onto the skin once a week. Applied for the first time on 03/30/13.   Yes Historical Provider, MD  divalproex (DEPAKOTE ER) 500 MG 24 hr tablet Take 500 mg by mouth daily.   Yes Historical Provider, MD  hydrochlorothiazide  (HYDRODIURIL) 25 MG tablet Take 1 tablet (25 mg total) by mouth daily. For high blood pressure 03/21/13  Yes Sanjuana Kava, NP  lamoTRIgine (LAMICTAL) 200 MG tablet Take 200 mg by mouth daily. For mood stabilization.   Yes Historical Provider, MD  lisinopril (PRINIVIL,ZESTRIL) 5 MG tablet Take 20 mg by mouth daily.    Yes Historical Provider, MD  metFORMIN (GLUCOPHAGE) 1000 MG tablet Take 1 tablet (1,000 mg total) by mouth daily with breakfast. For high blood sugar control 03/21/13  Yes Sanjuana Kava, NP  Methylsulfonylmethane (MSM PO) Take 2 tablets by mouth daily.    Yes Historical Provider, MD  Multiple Vitamin (MULTIVITAMIN WITH MINERALS) TABS Take 1 tablet by mouth daily.   Yes Historical Provider, MD  pravastatin (PRAVACHOL) 80 MG tablet Take 1 tablet (80 mg total) by mouth daily. For high cholesterol control 03/21/13  Yes Sanjuana Kava, NP  traMADol (ULTRAM) 50 MG tablet Take 50 mg by mouth 2 (two) times daily as needed (migraine headache.).    Yes Historical Provider, MD  vitamin B-12 (CYANOCOBALAMIN) 1000 MCG tablet Take 2,000 mcg by mouth daily. For Vitamin B-12 deficiency 03/21/13  Yes Sanjuana Kava, NP   Physical Exam: Filed Vitals:   03/31/13 1124 03/31/13 1230 03/31/13 1450 03/31/13 1713  BP:   143/96   Pulse:   87   Temp:   98.4 F (36.9 C)   TempSrc:   Oral   Resp:   16   Height:    4\' 9"  (1.448 m)  Weight:    78.104 kg (172 lb 3 oz)  SpO2: 94% 100% 95%      General:  Well-developed well-nourished speaking in full sentences in no acute cardiopulmonary distress.  Eyes: Pupils equal and reactive to light and accommodation. Extraocular movements intact.  ENT: Oropharynx is clear, no lesions, no exudates.  Neck: Supple with no lymphadenopathy.  Cardiovascular: Regular rate rhythm no murmurs rubs or gallops. No JVD. No lower extremity edema.  Respiratory: Decreased breath sounds in the bases otherwise clear.  Abdomen: Soft, nontender, nondistended, positive bowel  sounds.  Skin: No rashes or lesions.  Musculoskeletal: 5/5 BUE strength, 5/5 BLE strength  Psychiatric: Normal mood. Normal affect. Fair insight. Fair judgment.  Neurologic: Alert and oriented x3. Cranial nerves II through XII are grossly intact. No focal deficits.  Labs on Admission:  Basic Metabolic Panel:  Recent Labs Lab 03/31/13 1200  NA 140  K 4.6  CL 103  GLUCOSE 153*  BUN 26*  CREATININE 1.00   Liver Function Tests: No results found for this basename: AST, ALT, ALKPHOS, BILITOT, PROT, ALBUMIN,  in the last 168 hours No results found for this basename: LIPASE, AMYLASE,  in the last 168 hours No results found for this basename: AMMONIA,  in the last 168 hours CBC:  Recent Labs Lab 03/31/13 1150 03/31/13 1200  WBC 19.2*  --   NEUTROABS 14.8*  --   HGB 9.5* 12.6  HCT 35.0* 37.0  MCV 74.9*  --   PLT 595*  --    Cardiac Enzymes: No results found for this basename: CKTOTAL, CKMB, CKMBINDEX, TROPONINI,  in the last 168 hours  BNP (last 3 results) No results found for this basename: PROBNP,  in the last 8760 hours CBG: No results found for this basename: GLUCAP,  in the last 168 hours  Radiological Exams on Admission: Dg Chest 2 View  03/31/2013  *RADIOLOGY REPORT*  Clinical Data: Cough and shortness of breath  CHEST - 2 VIEW  Comparison: 06/26/2006  Findings: The cardiac shadow is mildly enlarged.  Mild cephalization of the pulmonary vascularity is noted.  No focal infiltrate or sizable effusion is seen.  Findings consistent with a prior shunt are noted.  IMPRESSION: Mild cephalization of the pulmonary vasculature.  No other focal abnormality is seen.   Original Report Authenticated By: Alcide Clever, M.D.    Ct Angio Chest Pe W/cm &/or Wo Cm  03/31/2013  *RADIOLOGY REPORT*  Clinical Data: Cough, shortness of breath, recent pneumonia, pulmonary embolism, past history hypertension, diabetes  CT ANGIOGRAPHY CHEST  Technique:  Multidetector CT imaging of the chest  using the standard protocol during bolus administration of intravenous contrast. Multiplanar reconstructed images including MIPs were obtained and reviewed to evaluate the vascular anatomy.  Contrast: OMNIPAQUE IOHEXOL 350 MG/ML SOLN  Comparison: None  Findings: Aorta normal caliber without aneurysm or dissection. Dilated main pulmonary artery 3.4 x 3.5 cm diameter. Pulmonary arteries patent. No evidence of pulmonary embolism. No thoracic adenopathy. Enlargement of lateral segment left lobe liver. Remaining visualized portions of upper abdomen unremarkable. Scattered patchy infiltrates bilateral upper and lower lobes. No pleural or pericardial effusions. No acute osseous findings.  IMPRESSION: No evidence of pulmonary embolism. Scattered patchy bilateral pulmonary infiltrates. Dilated main pulmonary artery, which can be seen with pulmonary arterial hypertension.   Original Report Authenticated By: Ulyses Southward, M.D.     EKG: Independently reviewed. T wave inversion in leads 2,3 aVF and lead V2-V6.  Assessment/Plan Principal Problem:   Acute respiratory failure with hypoxia Active Problems:   Bipolar I disorder, most recent episode (or current) manic   Hypoxia   HCAP (healthcare-associated pneumonia)   Abnormal EKG   Leukocytosis   HTN (hypertension)   Hyperlipidemia   Bipolar disorder   Diabetes   Vitamin D deficiency   Anemia  #1 acute respiratory distress with hypoxia Likely multifactorial. Secondary to a healthcare associated pneumonia noted on CT of the chest and probable cardiac etiology as patient had some T wave changes that are new on that EKG. Will admit the patient to the step down unit as patient is hypoxic to 70% oral meds. Will check a urine Legionella antigen. Check a urine strep pneumococcus antigen. Will cycle cardiac enzymes every 6 hours x3. Will check a 2-D echo. We'll place patient empirically on IV vancomycin, Levaquin, cefepime. We'll also place on aspirin 81 mg daily,  oxygen, Tessalon Perles, nebulizer treatments. Follow.  #2 healthcare associated pneumonia Patient recently discharged from [facility] 03/21/2013. CT of the chest showing bilateral pulmonary infiltrates. Patient with a leukocytosis. Patient with a cough and worsening shortness of breath. Will check a urine Legionella antigen. Check a urine pneumococcus antigen. Check a sputum Gram stain and culture. Check blood cultures x2. Place empirically on IV vancomycin, IV Levaquin, IV cefepime. Place on [employer]. Place on nebulizer treatments. Follow.  #3 EKG changes Patient has a history of diabetes, hypertension, hyperlipidemia. Will cycle cardiac enzymes every 6 hours x3. Check a 2-D echo. We'll place on aspirin, oxygen, nitroglycerin as needed, morphine sulfate as needed, continue statin, ACE inhibitor. We'll start low dose beta blocker. Follow. Will consult with cardiology for  further evaluation and management.  #4 leukocytosis Likely secondary to problem #2. Will check a UA with cultures and sensitivities. Will check blood cultures x2. Will place empirically on IV vancomycin, cefepime, Levaquin.  #5 bipolar disorder Continue home regimen of Depakote, Lamictal, saphris. Follow.  #6 hypertension Continue home dose lisinopril. Lopressor has been added secondary to EKG changes.  #7 hyperlipidemia Check a fasting lipid panel. Continue home dose statin.  #8 diabetes mellitus Check a hemoglobin A1c. Will hold Glucophage. Sliding scale insulin.  #9 prophylaxis PPI for GI prophylaxis. Lovenox for DVT prophylaxis.    Code Status: Full Family Communication: Updated patient and husband at bedside. Disposition Plan: Admit to SDU  Time spent: 65 mins  [facility] Pager [phone]  If 7PM-7AM, please contact night-coverage [email or web address] Password [facility] 03/31/2013, 5:52 PM

## 2013-03-31 NOTE — ED Notes (Signed)
Bed:WA04<BR> Expected date:<BR> Expected time:<BR> Means of arrival:<BR> Comments:<BR>

## 2013-03-31 NOTE — ED Provider Notes (Signed)
Medical screening examination/treatment/procedure(s) were performed by non-physician practitioner and as supervising physician I was immediately available for consultation/collaboration. Devoria Albe, MD, FACEP   Ward Givens, MD 03/31/13 [id number]

## 2013-03-31 NOTE — ED Provider Notes (Signed)
History     CSN: [id number]  Arrival date & time 03/31/13  1107   First MD Initiated Contact with Patient 03/31/13 1119      No chief complaint on file.   (Consider location/radiation/quality/duration/timing/severity/associated sxs/prior treatment) HPI  42 year old female with history of hypertension, diabetes, and bipolar presents for evaluations of shortness of breath. Patient was brought via EMS from doctor's office at [facility]. Patient reports for the past 2 weeks she has had persistent cough with white to yellow sputum. For the past week she has been having increased shortness of breath worsening with exertion. She was seen at her primary care Dr. 3 days ago was diagnosed with having pneumonia. She was placed on Avelox along with cough medication. Her symptoms continues to worsen and when she followup again today at the office she was found to be hypoxic. She was sent to the ER for further management. Patient denies fever or chills. No sneezing, runny nose, sore throat. No nausea, vomiting, diarrhea. No hemoptysis, no rash. She denies history of PE or DVT. Not on birth control pill, no recent surgery, no prolonged bed rest or long travel. After receiving breathing treatment include albuterol and Atrovent along with Solu-Medrol patient felt much better. She denies history of asthma. She is a nonsmoker.  Past Medical History  Diagnosis Date  . Hypertension   . Diabetes mellitus without complication   . Hypercholesteremia     Past Surgical History  Procedure Laterality Date  . Ventriculoperitoneal shunt    . Cesarean section    . Cyst excision      No family history on file.  History  Substance Use Topics  . Smoking status: Never Smoker   . Smokeless tobacco: Not on file  . Alcohol Use: No    OB History   Grav Para Term Preterm Abortions TAB SAB Ect Mult Living                  Review of Systems  Constitutional:       10 Systems reviewed and all are  negative for acute change except as noted in the HPI.     Allergies  Review of patient's allergies indicates no known allergies.  Home Medications   Current Outpatient Rx  Name  Route  Sig  Dispense  Refill  . asenapine (SAPHRIS) 5 MG SUBL   Sublingual   Place 2 tablets (10 mg total) under the tongue at bedtime. For mood control   30 tablet   0   . aspirin 81 MG tablet   Oral   Take 1 tablet (81 mg total) by mouth daily. For heart health   30 tablet      . cloNIDine (CATAPRES - DOSED IN MG/24 HR) 0.1 mg/24hr patch   Transdermal   Place 30 patches (3 mg total) onto the skin once a week. For high blood pressure control   30 patch   0   . divalproex (DEPAKOTE ER) 500 MG 24 hr tablet   Oral   Take 2 tablets (1,000 mg total) by mouth at bedtime. For mood stabilization   60 tablet   0   . hydrochlorothiazide (HYDRODIURIL) 25 MG tablet   Oral   Take 1 tablet (25 mg total) by mouth daily. For high blood pressure   30 tablet   0   . lamoTRIgine (LAMICTAL) 100 MG tablet   Oral   Take 1 tablet (100 mg total) by mouth daily. For mood stabilization  30 tablet   0   . lisinopril (PRINIVIL,ZESTRIL) 2.5 MG tablet   Oral   Take 1 tablet (2.5 mg total) by mouth daily. For high blood pressure conrtrol   30 tablet   0   . metFORMIN (GLUCOPHAGE) 1000 MG tablet   Oral   Take 1 tablet (1,000 mg total) by mouth daily with breakfast. For high blood sugar control         . potassium chloride (K-DUR,KLOR-CON) 10 MEQ tablet   Oral   Take 1 tablet (10 mEq total) by mouth daily. For low potassium levels   30 tablet   0   . pravastatin (PRAVACHOL) 80 MG tablet   Oral   Take 1 tablet (80 mg total) by mouth daily. For high cholesterol control         . traZODone (DESYREL) 50 MG tablet   Oral   Take 1 tablet (50 mg total) by mouth at bedtime as needed for sleep (May repeat x1). For depression/sleep   60 tablet   0   . vitamin B-12 (CYANOCOBALAMIN) 1000 MCG tablet   Oral    Take 1 tablet (1,000 mcg total) by mouth daily. For Vitamin B-12 deficiency           BP 142/97  Pulse 91  Temp(Src) 99.5 F (37.5 C) (Oral)  Resp 22  LMP 03/13/2013  Physical Exam  Nursing note and vitals reviewed. Constitutional: She is oriented to person, place, and time. She appears well-developed and well-nourished. No distress.  Awake, alert, nontoxic appearance  HENT:  Head: Atraumatic.  Mouth/Throat: Oropharynx is clear and moist.  Eyes: Conjunctivae are normal. Right eye exhibits no discharge. Left eye exhibits no discharge.  Neck: Neck supple.  Cardiovascular: Normal rate and regular rhythm.   Pulmonary/Chest: Effort normal. No respiratory distress. She has wheezes (Scattered wheezes and rhonchi heard with decreased lung sounds at the base.). She exhibits no tenderness.  Abdominal: Soft. There is no tenderness. There is no rebound.  Musculoskeletal: She exhibits no edema and no tenderness.  ROM appears intact, no obvious focal weakness  Neurological: She is alert and oriented to person, place, and time.  Mental status and motor strength appears intact  Skin: No rash noted.  Psychiatric: She has a normal mood and affect.    ED Course  Procedures (including critical care time)   Date: 03/31/2013  Rate: 94  Rhythm: normal sinus rhythm  QRS Axis: normal  Intervals: normal  ST/T Wave abnormalities: nonspecific T wave changes  Conduction Disutrbances:none  Narrative Interpretation: Twave inversion in inferior leads, new.    Old EKG Reviewed: changes noted    11:35 AM Patient with unresolved pneumonia while currently on Avelox for the past 2 days. She was found to be hypoxic initially. She is currently satting at 100% on 2L O2 in no acute respiratory distress at this time. Will obtain a chest x-ray to reassess her lung function.  12:24 PM Since patient has no improvement after taking antibiotic for the past 2-3 days, and a chest x-ray today shows no signs of  pneumonia however she has been started exertion, and hypoxia I will obtain a chest CTA to rule out PE.  Patient has an elevated white count of 19.2.  2:29 PM Chest CTA shows no evidence of PE however it does confirm evidence of pneumonia. Will give patient antibiotic here. Will have patient ambulate while checking oxygenation. If she is able to maintain adequate oxygenation and she is stable for discharge, if  not we'll consider admission.  4:04 PM Without supplemental O2 patient's sats dropped down to 77% and appears drowsy. With oxygenation she sats in the 95-96% on room air. At this time, I will consult for admission for her lung infection.  4:40 PM Pt was admitted to [facility] 2-3 weeks ago.  Now that she has her lung infection, Dr. Janee Morn, [facility] recommend treatment as HCAP with vanc/cefepime.  ABG ordered as requested.  Pt currently on 4L [place].  Will admit to step down, Team 5, under his care.    5:12 PM Since pt endorse SOB, i have ordered an ECG.  ECG showed abnormal Twave changes, this is new.  Will order troponin.  Pt will need further work up to rule out cardiac disease.    Care discussed with attending.  Labs Reviewed  CBC WITH DIFFERENTIAL - Abnormal; Notable for the following:    WBC 19.2 (*)    Hemoglobin 9.5 (*)    HCT 35.0 (*)    MCV 74.9 (*)    MCH 20.3 (*)    MCHC 27.1 (*)    RDW 19.5 (*)    Platelets 595 (*)    Neutro Abs 14.8 (*)    Monocytes Absolute 1.5 (*)    All other components within normal limits  POCT I-STAT, CHEM 8 - Abnormal; Notable for the following:    BUN 26 (*)    Glucose, Bld 153 (*)    Calcium, Ion 1.10 (*)    All other components within normal limits   Dg Chest 2 View  03/31/2013  *RADIOLOGY REPORT*  Clinical Data: Cough and shortness of breath  CHEST - 2 VIEW  Comparison: 06/26/2006  Findings: The cardiac shadow is mildly enlarged.  Mild cephalization of the pulmonary vascularity is noted.  No focal infiltrate or sizable effusion is seen.   Findings consistent with a prior shunt are noted.  IMPRESSION: Mild cephalization of the pulmonary vasculature.  No other focal abnormality is seen.   Original Report Authenticated By: Alcide Clever, M.D.    Ct Angio Chest Pe W/cm &/or Wo Cm  03/31/2013  *RADIOLOGY REPORT*  Clinical Data: Cough, shortness of breath, recent pneumonia, pulmonary embolism, past history hypertension, diabetes  CT ANGIOGRAPHY CHEST  Technique:  Multidetector CT imaging of the chest using the standard protocol during bolus administration of intravenous contrast. Multiplanar reconstructed images including MIPs were obtained and reviewed to evaluate the vascular anatomy.  Contrast: OMNIPAQUE IOHEXOL 350 MG/ML SOLN  Comparison: None  Findings: Aorta normal caliber without aneurysm or dissection. Dilated main pulmonary artery 3.4 x 3.5 cm diameter. Pulmonary arteries patent. No evidence of pulmonary embolism. No thoracic adenopathy. Enlargement of lateral segment left lobe liver. Remaining visualized portions of upper abdomen unremarkable. Scattered patchy infiltrates bilateral upper and lower lobes. No pleural or pericardial effusions. No acute osseous findings.  IMPRESSION: No evidence of pulmonary embolism. Scattered patchy bilateral pulmonary infiltrates. Dilated main pulmonary artery, which can be seen with pulmonary arterial hypertension.   Original Report Authenticated By: Ulyses Southward, M.D.      1. Leukocytosis   2. Anemia   3. HCAP (healthcare-associated pneumonia)       MDM  BP 143/96  Pulse 87  Temp(Src) 98.4 F (36.9 C) (Oral)  Resp 16  SpO2 95%  LMP 03/13/2013  I have reviewed nursing notes and vital signs. I personally reviewed the imaging tests through PACS system  I reviewed available ER/hospitalization records thought the EMR  Fayrene Helper, PA-C 03/31/13 1715

## 2013-03-31 NOTE — ED Notes (Addendum)
Report given to Pam,RN in ICU. Dr.Thompson is here to assess the pt. Will transfer to the floor once his assessment is complete.

## 2013-03-31 NOTE — Progress Notes (Addendum)
ANTIBIOTIC CONSULT NOTE - INITIAL  Pharmacy Consult for:  Cefepime and Vancomycin, and adjust doses of other antibiotics for renal function Indication:  Pneumonia (HCAP)  Allergies  Allergen Reactions  . Abilify (Aripiprazole)     hyperglycemia    Patient Measurements: 03/16/13 - Ht. 4'9"  Wt. 78.1 kg  Vital Signs: Temp: 98.4 F (36.9 C) (04/14 1450) Temp src: Oral (04/14 1450) BP: 143/96 mmHg (04/14 1450) Pulse Rate: 87 (04/14 1450)   Labs:  Recent Labs  03/31/13 1150 03/31/13 1200  WBC 19.2*  --   HGB 9.5* 12.6  PLT 595*  --   CREATININE  --  1.00   The estimated CrCl is 62.3 ml/min.  Medical History: Past Medical History  Diagnosis Date  . Hypertension   . Diabetes mellitus without complication   . Hypercholesteremia     Medications (PTA List):  Asenapine maleate (Saphris), aspirin, benzonatate (Tessalon), divalproex (Depakote ER), HTCZ, lamotrigine (Lamictal), lisinopril (Prinivil, Zestril), metformin (Glucophage), methylsulfonylmethane (MSM), multiple vitamin with minerals, pravastatin (Pravachol), tramadol (Ultram), trazodone (Desyrel), vitamin B-12  Assessment:  Asked to assist with Cefepime and Vancomycin therapy for this 42 year-old female, referred to the ED by PCP due to difficulty breathing. Levaquin has also been ordered.  Started on Avelox 3 days prior to admission for pneumonia,  according to physician's note.  Inpatient stay at [facility] 3/30-03/26/13  Goals of Therapy:   Vancomycin trough levels 15-20 mcg/ml  Eradication of infection  Antibiotic doses appropriate for renal function  Plan:   Cefepime 1 gm IV every 8 hours  Vancomycin 1 gram x 1 in the ED, then 1 gram every 12 hours.  Continue Levaquin as ordered.  Polo Riley R.Ph. 03/31/2013 5:20 PM

## 2013-04-01 ENCOUNTER — Inpatient Hospital Stay (HOSPITAL_COMMUNITY): Payer: 59

## 2013-04-01 ENCOUNTER — Encounter (HOSPITAL_COMMUNITY): Payer: Self-pay | Admitting: Pulmonary Disease

## 2013-04-01 DIAGNOSIS — R7989 Other specified abnormal findings of blood chemistry: Secondary | ICD-10-CM

## 2013-04-01 DIAGNOSIS — D72829 Elevated white blood cell count, unspecified: Secondary | ICD-10-CM

## 2013-04-01 DIAGNOSIS — I517 Cardiomegaly: Secondary | ICD-10-CM

## 2013-04-01 DIAGNOSIS — R0602 Shortness of breath: Secondary | ICD-10-CM

## 2013-04-01 DIAGNOSIS — J189 Pneumonia, unspecified organism: Principal | ICD-10-CM

## 2013-04-01 LAB — CBC WITH DIFFERENTIAL/PLATELET
Basophils Relative: 0 % (ref 0–1)
Eosinophils Absolute: 0 10*3/uL (ref 0.0–0.7)
Eosinophils Relative: 0 % (ref 0–5)
Lymphs Abs: 2.1 10*3/uL (ref 0.7–4.0)
MCH: 20.4 pg — ABNORMAL LOW (ref 26.0–34.0)
MCHC: 26.6 g/dL — ABNORMAL LOW (ref 30.0–36.0)
MCV: 76.5 fL — ABNORMAL LOW (ref 78.0–100.0)
Monocytes Relative: 11 % (ref 3–12)
Neutrophils Relative %: 78 % — ABNORMAL HIGH (ref 43–77)
Platelets: 540 10*3/uL — ABNORMAL HIGH (ref 150–400)
RBC: 4.22 MIL/uL (ref 3.87–5.11)

## 2013-04-01 LAB — TROPONIN I
Troponin I: <0.3 ng/mL (ref <0.30)
Troponin I: <0.3 ng/mL (ref <0.30)

## 2013-04-01 LAB — LIPID PANEL
Cholesterol: 146 mg/dL (ref 0–200)
LDL Cholesterol: 83 mg/dL (ref 0–99)
VLDL: 24 mg/dL (ref 0–40)

## 2013-04-01 LAB — GLUCOSE, CAPILLARY
Glucose-Capillary: 135 mg/dL — ABNORMAL HIGH (ref 70–99)
Glucose-Capillary: 124 mg/dL — ABNORMAL HIGH (ref 70–99)
Glucose-Capillary: 106 mg/dL — ABNORMAL HIGH (ref 70–99)
Glucose-Capillary: 130 mg/dL — ABNORMAL HIGH (ref 70–99)

## 2013-04-01 LAB — BASIC METABOLIC PANEL
BUN: 26 mg/dL — ABNORMAL HIGH (ref 6–23)
CO2: 30 mEq/L (ref 19–32)
Calcium: 8.6 mg/dL (ref 8.4–10.5)
GFR calc non Af Amer: >90 mL/min (ref >90)
Glucose, Bld: 159 mg/dL — ABNORMAL HIGH (ref 70–99)
Sodium: 137 mEq/L (ref 135–145)

## 2013-04-01 LAB — URINALYSIS, ROUTINE W REFLEX MICROSCOPIC
Glucose, UA: NEGATIVE mg/dL
Hgb urine dipstick: NEGATIVE
Ketones, ur: NEGATIVE mg/dL
Protein, ur: NEGATIVE mg/dL
pH: 5.5 (ref 5.0–8.0)

## 2013-04-01 LAB — EXPECTORATED SPUTUM ASSESSMENT W GRAM STAIN, RFLX TO RESP C

## 2013-04-01 MED ORDER — GUAIFENESIN ER 600 MG PO TB12
1200.0000 mg | ORAL_TABLET | Freq: Two times a day (BID) | ORAL | Status: DC
  Administered 2013-04-01 – 2013-04-04 (×7): 1200 mg via ORAL
  Filled 2013-04-01 (×8): qty 2

## 2013-04-01 MED ORDER — ASENAPINE MALEATE 5 MG SL SUBL
10.0000 mg | SUBLINGUAL_TABLET | Freq: Every day | SUBLINGUAL | Status: DC
  Administered 2013-04-01 – 2013-04-07 (×7): 10 mg via SUBLINGUAL
  Filled 2013-04-01 (×8): qty 2

## 2013-04-01 MED ORDER — FUROSEMIDE 10 MG/ML IJ SOLN
40.0000 mg | Freq: Once | INTRAMUSCULAR | Status: AC
  Administered 2013-04-01: 40 mg via INTRAVENOUS

## 2013-04-01 MED ORDER — FUROSEMIDE 10 MG/ML IJ SOLN
INTRAMUSCULAR | Status: AC
  Filled 2013-04-01: qty 4

## 2013-04-01 NOTE — Progress Notes (Signed)
Placed pt on bipap auto titration mode. Settings were min epap-4 cmH2O, max ipap-20 cmH2O, with max PS-8 cmH2O. Pt wore for about 7 minutes & while I was charting she said she couldn't stand it. Placed back on 2L [place] at this time.  Jacqulynn Cadet RRT

## 2013-04-01 NOTE — Progress Notes (Signed)
[facility] PROGRESS NOTE  [name] UJW:[id number] DOB: 07-Sep-1971 DOA: 03/31/2013 PCP: Juline Patch, MD  Assessment/Plan: #1 acute respiratory distress with hypoxia Likely multifactorial secondary to healthcare associated pneumonia/pulmonary infiltrates bilaterally noted on CT of the chest and probable cardiac etiology as patient had abnormal EKG changes versus volume overload. Urine Legionella antigen is pending. Urine pneumococcus antigen is pending. Initial set of cardiac enzymes were borderline elevated troponin however subsequent troponins negative x2. 2-D echo is pending. Continue IV vancomycin, Levaquin, cefepime. Continue aspirin, oxygen, Lopressor, nebulizer treatments. D/C Tessalon Perles. Stat Mucinex. Will check a BNP. Will consult with critical care medicine for further evaluation and management.  #2 probable healthcare associated pneumonia CT of the chest showed bilateral pulmonary infiltrates. Patient with a leukocytosis. Patient will call for worsening shortness of breath. Sputum Gram stain and cultures pending. Urine Legionella antigen is pending. Urine pneumococcus antigen is pending. Blood cultures are pending. Continue empiric IV vancomycin, Levaquin, cefepime. D/C Tessalon Perles. Stat Mucinex. Continue nebulizer treatments. Follow.  #3 EKG changes Initial set of troponin borderline elevated at 0.31. Subsequent sets of troponin negative x2. Patient with multiple cardiac risk factors including diabetes hypertension hyperlipidemia. 2-D echo is pending. Continue aspirin, oxygen, nitroglycerin, statin, ACE inhibitor, Lopressor. Per cardiology probable outpatient stress test. Cardiology is following and appreciate input and recommendations.  #4 leukocytosis Likely secondary to problem #2. Patient was also given a dose of IV steroids her PCPs office 3 days prior to admission. Blood cultures are pending. Urine Legionella antigen is pending. Urine pneumococcus antigen is  pending. Will check a UA with cultures and sensitivities. Follow.  #5 bipolar disorder Continue home regimen of Depakote, Lamictal, saphris  #6 hypertension Stable. Continue lisinopril and Lopressor.  #7 hyperlipidemia Fasting lipid panel is pending. Continue statin.  #8 well controlled type 2 diabetes Hemoglobin A1c 6.9. CBGs have ranged from 135-149. Continue to hold metformin. Sliding scale insulin.  #9 prophylaxis PPI for GI prophylaxis. Lovenox for DVT prophylaxis.   Code Status: Full Family Communication: Updated patient no family at bedside. Disposition Plan: Home when medically stable.   Consultants:  Cardiology: Dr. Graciela Husbands 03/31/2013  PCCM. pending  Procedures:  Chest x-ray 04/01/2039  CT of the chest 03/31/2013  2-D echo pending  Antibiotics:  IV vancomycin for 14 2004 2  IV cefepime 04/01/2039  IV Levaquin for 14 2014  HPI/Subjective: Patient states had a coughing spell this morning. Patient states had some wheezing. Patient states wheezing improved with nebulizer treatments.  Objective: Filed Vitals:   04/01/13 0600 04/01/13 0611 04/01/13 0700 04/01/13 0800  BP: 124/94  122/80   Pulse:  73 78   Temp:    97.4 F (36.3 C)  TempSrc:    Oral  Resp: 26 19 19    Height:      Weight:      SpO2: 100% 97% 98%     Intake/Output Summary (Last 24 hours) at 04/01/13 0838 Last data filed at 04/01/13 0704  Gross per 24 hour  Intake   1625 ml  Output      0 ml  Net   1625 ml   Filed Weights   03/31/13 1713  Weight: 78.104 kg (172 lb 3 oz)    Exam:   General:  NAD  Cardiovascular: RRR  Respiratory: Decreased breath sounds in the bases.  Abdomen: Soft, nontender, nondistended, positive bowel sounds.   Extremities: No clubbing no cyanosis trace to 1+ bilateral lower extremity edema.  Musculoskeletal: 5/5 BUE strength, 5/5 BLE strength   Data  Reviewed: Basic Metabolic Panel:  Recent Labs Lab 03/31/13 1200 03/31/13 1914  04/01/13 0645  NA 140  --  137  K 4.6  --  5.1  CL 103  --  101  CO2  --   --  30  GLUCOSE 153*  --  159*  BUN 26*  --  26*  CREATININE 1.00  --  0.75  CALCIUM  --   --  8.6  MG  --  2.2  --    Liver Function Tests: No results found for this basename: AST, ALT, ALKPHOS, BILITOT, PROT, ALBUMIN,  in the last 168 hours No results found for this basename: LIPASE, AMYLASE,  in the last 168 hours No results found for this basename: AMMONIA,  in the last 168 hours CBC:  Recent Labs Lab 03/31/13 1150 03/31/13 1200 04/01/13 0645  WBC 19.2*  --  19.3*  NEUTROABS 14.8*  --  15.1*  HGB 9.5* 12.6 8.6*  HCT 35.0* 37.0 32.3*  MCV 74.9*  --  76.5*  PLT 595*  --  540*   Cardiac Enzymes:  Recent Labs Lab 03/31/13 1731 04/01/13 0020 04/01/13 0645  TROPONINI 0.31* <0.30 <0.30   BNP (last 3 results) No results found for this basename: PROBNP,  in the last 8760 hours CBG:  Recent Labs Lab 03/31/13 2218 04/01/13 0726  GLUCAP 149* 135*    Recent Results (from the past 240 hour(s))  MRSA PCR SCREENING     Status: None   Collection Time    03/31/13  6:08 PM      Result Value Range Status   MRSA by PCR NEGATIVE  NEGATIVE Final   Comment:            The GeneXpert MRSA Assay (FDA     approved for NASAL specimens     only), is one component of a     comprehensive MRSA colonization     surveillance program. It is not     intended to diagnose MRSA     infection nor to guide or     monitor treatment for     MRSA infections.     Studies: Dg Chest 2 View  03/31/2013  *RADIOLOGY REPORT*  Clinical Data: Cough and shortness of breath  CHEST - 2 VIEW  Comparison: 06/26/2006  Findings: The cardiac shadow is mildly enlarged.  Mild cephalization of the pulmonary vascularity is noted.  No focal infiltrate or sizable effusion is seen.  Findings consistent with a prior shunt are noted.  IMPRESSION: Mild cephalization of the pulmonary vasculature.  No other focal abnormality is seen.    Original Report Authenticated By: Alcide Clever, M.D.    Ct Angio Chest Pe W/cm &/or Wo Cm  03/31/2013  *RADIOLOGY REPORT*  Clinical Data: Cough, shortness of breath, recent pneumonia, pulmonary embolism, past history hypertension, diabetes  CT ANGIOGRAPHY CHEST  Technique:  Multidetector CT imaging of the chest using the standard protocol during bolus administration of intravenous contrast. Multiplanar reconstructed images including MIPs were obtained and reviewed to evaluate the vascular anatomy.  Contrast: OMNIPAQUE IOHEXOL 350 MG/ML SOLN  Comparison: None  Findings: Aorta normal caliber without aneurysm or dissection. Dilated main pulmonary artery 3.4 x 3.5 cm diameter. Pulmonary arteries patent. No evidence of pulmonary embolism. No thoracic adenopathy. Enlargement of lateral segment left lobe liver. Remaining visualized portions of upper abdomen unremarkable. Scattered patchy infiltrates bilateral upper and lower lobes. No pleural or pericardial effusions. No acute osseous findings.  IMPRESSION: No evidence of  pulmonary embolism. Scattered patchy bilateral pulmonary infiltrates. Dilated main pulmonary artery, which can be seen with pulmonary arterial hypertension.   Original Report Authenticated By: Ulyses Southward, M.D.     Scheduled Meds: . albuterol  2.5 mg Nebulization TID  . antiseptic oral rinse  15 mL Mouth Rinse BID  . aspirin EC  81 mg Oral Daily  . benzonatate  200 mg Oral TID  . ceFEPime (MAXIPIME) IV  1 g Intravenous Q8H  . divalproex  500 mg Oral Daily  . enoxaparin (LOVENOX) injection  40 mg Subcutaneous Q24H  . insulin aspart  0-15 Units Subcutaneous TID WC  . ipratropium  0.5 mg Nebulization TID  . lamoTRIgine  100 mg Oral Daily  . levofloxacin  750 mg Intravenous Q24H  . lisinopril  20 mg Oral Daily  . metoprolol tartrate  12.5 mg Oral BID  . multivitamin with minerals  1 tablet Oral Daily  . pantoprazole  40 mg Oral Daily  . simvastatin  40 mg Oral q1800  . vancomycin   1,000 mg Intravenous Q12H  . vitamin B-12  1,000 mcg Oral Daily   Continuous Infusions:   Principal Problem:   Acute respiratory failure with hypoxia Active Problems:   Bipolar I disorder, most recent episode (or current) manic   HCAP (healthcare-associated pneumonia)   Abnormal EKG   Leukocytosis   HTN (hypertension)   Hyperlipidemia   Diabetes   Vitamin D deficiency   Anemia   Elevated troponin    Time spent: > 35 MINS    [facility]  [facility] Pager [phone]. If 7PM-7AM, please contact night-coverage at [email or web address], password [facility] 04/01/2013, 8:38 AM  LOS: 1 day

## 2013-04-01 NOTE — Progress Notes (Signed)
  Echocardiogram 2D Echocardiogram has been performed.  Ellender Hose A 04/01/2013, 2:05 PM

## 2013-04-01 NOTE — Evaluation (Signed)
Physical Therapy Evaluation Patient Details Name: [name] MRN: [id number] DOB: 22-Apr-1971 Today's Date: 04/01/2013 Time: 7829-5621 PT Time Calculation (min): 17 min  PT Assessment / Plan / Recommendation Clinical Impression  42 y/o female admitted 03/31/13 with hypoxia, pneumonia.  Cardiac workup ongoing. Pt. recently in [facility]. Pt. will benefit from PT while in acute care to return to independent  level.    PT Assessment  Patient needs continued PT services    Follow Up Recommendations  Home health PT    Does the patient have the potential to tolerate intense rehabilitation      Barriers to Discharge        Equipment Recommendations  None recommended by PT    Recommendations for Other Services     Frequency Min 3X/week    Precautions / Restrictions Precautions Precautions: Fall Precaution Comments: monitor sats. Restrictions Weight Bearing Restrictions: No   Pertinent Vitals/Pain  sats on 3 l 96 %, drop to 87% on RA while ambulating in room. replce O2 , encouraged pursed lip breaths.      Mobility  Bed Mobility Bed Mobility: Sit to Supine Sit to Supine: 5: Supervision Details for Bed Mobility Assistance: cues not to hold breath Transfers Sit to Stand: 5: Supervision Ambulation/Gait Ambulation/Gait Assistance: 4: Min guard;5: Supervision Ambulation Distance (Feet): 20 Feet (x2) Assistive device: None Ambulation/Gait Assistance Details: pt laterally shifts as walks, appears steady. Gait Pattern: Wide base of support;Step-through pattern    Exercises     PT Diagnosis: Generalized weakness  PT Problem List: Decreased strength;Decreased activity tolerance;Decreased mobility;Cardiopulmonary status limiting activity;Decreased safety awareness;Decreased knowledge of precautions;Decreased knowledge of use of DME PT Treatment Interventions: DME instruction;Gait training;Stair training;Functional mobility training;Therapeutic activities;Therapeutic  exercise;Patient/family education   PT Goals Acute Rehab PT Goals PT Goal Formulation: With patient Time For Goal Achievement: 04/15/13 Potential to Achieve Goals: Good Pt will go Sit to Stand: Independently PT Goal: Sit to Stand - Progress: Goal set today Pt will go Stand to Sit: Independently PT Goal: Stand to Sit - Progress: Goal set today Pt will Ambulate: >150 feet;with least restrictive assistive device;with modified independence PT Goal: Ambulate - Progress: Goal set today Pt will Go Up / Down Stairs: 1-2 stairs;with supervision;with least restrictive assistive device PT Goal: Up/Down Stairs - Progress: Goal set today  Visit Information  Last PT Received On: 04/01/13 Assistance Needed: +1 PT/OT Co-Evaluation/Treatment: Yes    Subjective Data  Subjective: I am doing good Patient Stated Goal: to go home   Prior Functioning  Home Living Lives With: Spouse (2 kids 22 and 43 yo) Available Help at Discharge: Family Type of Home: House Home Access: Level entry Home Layout: Two level (bed and bath lower level) Bathroom Shower/Tub: Engineer, manufacturing systems: Standard Home Adaptive Equipment: None Prior Function Level of Independence: Independent Able to Take Stairs?: Yes Driving: Yes Vocation: Full time employment Comments: works in Production designer, 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 Communication Communication: No difficulties    Copywriter, advertising Overall Cognitive Status: Appears within functional limits for tasks assessed/performed Arousal/Alertness: Awake/alert Orientation Level: Oriented X4 / Intact Behavior During Session: [facility] for tasks performed    Extremity/Trunk Assessment Right Upper Extremity Assessment RUE ROM/Strength/Tone: [facility] for tasks assessed Left Upper Extremity Assessment LUE ROM/Strength/Tone: [facility] for tasks assessed Right Lower Extremity Assessment RLE ROM/Strength/Tone: WFL for tasks assessed RLE Sensation: WFL - Light Touch Left Lower Extremity  Assessment LLE ROM/Strength/Tone: WFL for tasks assessed LLE Sensation: WFL - Light Touch   Balance    End of Session  PT - End of Session Activity Tolerance: Patient tolerated treatment well Patient left: in chair;with call bell/phone within reach Nurse Communication: Mobility status  GP     Rada Hay 04/01/2013, 11:05 AM

## 2013-04-01 NOTE — Evaluation (Signed)
Occupational Therapy Evaluation Patient Details Name: [name] MRN: [id number] DOB: [birth year] Today's Date: 04/01/2013 Time: 0865-7846 OT Time Calculation (min): 17 min  OT Assessment / Plan / Recommendation Clinical Impression  This 42 year old female was admitted with acute respiratory failure with hypoxia.  She has pna and a h/o DM, HTN and bipolar disorder.  She will benefit from continued OT to increase independence with adls.      OT Assessment  Patient needs continued OT Services    Follow Up Recommendations  No OT follow up    Barriers to Discharge      Equipment Recommendations   (likely none by OT)    Recommendations for Other Services    Frequency  Min 2X/week    Precautions / Restrictions Precautions Precautions: Fall Restrictions Weight Bearing Restrictions: No   Pertinent Vitals/Pain No pain.  Pt did desaturate to 85% without 02 when walking to bathroom .  Replaced 02 and sats returned to 95%.    ADL  Grooming: Supervision/safety Where Assessed - Grooming: Unsupported standing Upper Body Bathing: Set up Where Assessed - Upper Body Bathing: Unsupported sitting Lower Body Bathing: Supervision/safety Where Assessed - Lower Body Bathing: Supported sit to stand Upper Body Dressing: Minimal assistance (lines) Where Assessed - Upper Body Dressing: Unsupported sitting Lower Body Dressing: Supervision/safety Where Assessed - Lower Body Dressing: Supported sit to stand Toilet Transfer: Hydrographic surveyor Method: Sit to Barista: Comfort height toilet Toileting - Architect and Hygiene: Supervision/safety Where Assessed - Engineer, mining and Hygiene: Sit to stand from 3-in-1 or toilet Equipment Used:  (none) Transfers/Ambulation Related to ADLs: ambulated to bathroom:  pt desaturated on RA.  Reconnected to 02.  Pt sways L and R when walking:  May be her baseline ADL Comments: pt is able to reach and  perform ADLs--began energy conservation education.  Tended to hold breath during bed mobility.  Educated on pursed lip breathing when sats dropped.    OT Diagnosis: Generalized weakness  OT Problem List: Cardiopulmonary status limiting activity;Decreased activity tolerance;Decreased knowledge of use of DME or AE OT Treatment Interventions: Self-care/ADL training;DME and/or AE instruction;Patient/family education;Balance training;Energy conservation   OT Goals Acute Rehab OT Goals OT Goal Formulation: With patient Time For Goal Achievement: 04/15/13 Potential to Achieve Goals: Good ADL Goals Pt Will Transfer to Toilet: with modified independence;Ambulation;Regular height toilet ADL Goal: Toilet Transfer - Progress: Goal set today Pt Will Perform Tub/Shower Transfer: Tub transfer;with modified independence ADL Goal: Tub/Shower Transfer - Progress: Goal set today Miscellaneous OT Goals Miscellaneous OT Goal #1: Pt will gather clothes and complete adl at mod I level, incorporating pursed lip breathing when needed and rest breaks as needed for energy conservation OT Goal: Miscellaneous Goal #1 - Progress: Goal set today  Visit Information  Last OT Received On: 04/01/13 PT/OT Co-Evaluation/Treatment: Yes    Subjective Data  Subjective: Oh good.  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 Communication Communication: No difficulties         Vision/Perception  Cognition  Cognition Overall Cognitive Status: Appears within  functional limits for tasks assessed/performed Arousal/Alertness: Awake/alert Orientation Level: Oriented X4 / Intact Behavior During Session: [facility] for tasks performed    Extremity/Trunk Assessment Right Upper Extremity Assessment RUE ROM/Strength/Tone: [facility] for tasks assessed Left Upper Extremity Assessment LUE ROM/Strength/Tone: WFL for tasks assessed     Mobility Bed Mobility Bed Mobility: Sit to Supine Sit to Supine: 5: Supervision Details for Bed Mobility Assistance: cues not to hold breath Transfers Transfers: Sit to Stand Sit to Stand: 5: Supervision     Exercise     Balance     End of Session OT - End of Session Activity Tolerance: Patient tolerated treatment well Patient left: in chair;with call bell/phone within reach  GO     Nyna Chilton 04/01/2013, 11:00 AM Marica Otter, OTR/L [phone] 04/01/2013

## 2013-04-01 NOTE — Consult Note (Signed)
PULMONARY  / CRITICAL CARE MEDICINE  Name: [name] MRN: [id number] DOB: 1971/10/22    ADMISSION DATE:  03/31/2013 CONSULTATION DATE:  04/01/2013   REFERRING MD :  Ramiro Harvest  CHIEF COMPLAINT:  Short of breath  BRIEF PATIENT DESCRIPTION:  42 yo never smoker admitted with productive cough and dyspnea.  Found to have pneumonia.  PCCM consulted to assist with management of respiratory failure.  SIGNIFICANT EVENTS:  STUDIES:  4/14 Echo >> 4/14 CT chest >> dilated PA, no PE, patchy b/l infiltrates  LINES / TUBES: PIV  CULTURES: 4/14 Legionella Ag >> 4/14 Pneumococcal Ag >>  4/14 HIV >> negative 4/14 Blood >>  4/14 Urine >>   ANTIBIOTICS: 4/14 Cefepime >> 4/14 Levaquin >> 4/14 Vancomycin >>   HISTORY OF PRESENT ILLNESS:   42 yo female never smoker reports chronic dyspnea on exertion >> she relates this to being out of shape.  Her daughter was recently treated for pneumonia.  She developed worsening of her dyspnea on 03/28/13, and was having dyspnea at rest also.  She also developed a cough with yellow sputum.  She was also wheezing.  She was seen by her PCP and started on avelox, depo-medrol injection and nebulizer treatment.  Her symptoms continued to get progressively worse.  She then present to the ED, and was found to have pneumonia and hypoxia.  She was admitted to SDU by [facility].  She had ABG which showed acute on chronic hypoxic/hypercapnic respiratory failure, and CT chest showed b/l pulmonary infiltrates with enlarged PA.  PCCM consulted to assist with management.  She has hx of OSA while living in [place].  She moved to [place] about 9 years ago, and stopped using CPAP after move.  She denies chest pain, palpitations, gland swelling, or skin rash.  She works in the post office.  She denies animal exposure.  There is no prior history of pneumonia or tuberculosis.  She is unaware of thyroid problems.  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  There is no  history of seizures.  There is no history of asthma.  PAST MEDICAL HISTORY :  Past Medical History  Diagnosis Date  . Hypertension   . Diabetes mellitus without complication   . Hyperlipidemia 03/31/2013  . Bipolar disorder 03/31/2013  . Diabetes 03/31/2013  . Vitamin D deficiency 03/31/2013  . Anemia microcytic 03/31/2013  . Rhabdomyolysis 03/31/2013    [facility] secondary to abilify  . OSA (obstructive sleep apnea)    Past Surgical History  Procedure Laterality Date  . Ventriculoperitoneal shunt    . Cesarean section    . Cyst excision     Prior to Admission medications   Medication Sig Start Date End Date Taking? Authorizing Provider  Asenapine Maleate 10 MG SUBL Place 1 tablet under the tongue at bedtime as needed (insomnia).    Yes Historical Provider, MD  aspirin 81 MG tablet Take 1 tablet (81 mg total) by mouth daily. For heart health 03/21/13  Yes Sanjuana Kava, NP  benzonatate (TESSALON) 200 MG capsule Take 200 mg by mouth 3 (three) times daily as needed for cough.   Yes Historical Provider, MD  cloNIDine (CATAPRES - DOSED IN MG/24 HR) 0.1 mg/24hr patch Place 1 patch onto the skin once a week. Applied for the first time on 03/30/13.   Yes Historical Provider, MD  divalproex (DEPAKOTE ER) 500 MG 24 hr tablet Take 500 mg by mouth daily.   Yes Historical Provider, MD  hydrochlorothiazide (HYDRODIURIL)  25 MG tablet Take 1 tablet (25 mg total) by mouth daily. For high blood pressure 03/21/13  Yes Sanjuana Kava, NP  lamoTRIgine (LAMICTAL) 200 MG tablet Take 200 mg by mouth daily. For mood stabilization.   Yes Historical Provider, MD  lisinopril (PRINIVIL,ZESTRIL) 5 MG tablet Take 20 mg by mouth daily.    Yes Historical Provider, MD  metFORMIN (GLUCOPHAGE) 1000 MG tablet Take 1 tablet (1,000 mg total) by mouth daily with breakfast. For high blood sugar control 03/21/13  Yes Sanjuana Kava, NP  Methylsulfonylmethane (MSM PO) Take 2 tablets by mouth daily.    Yes Historical Provider, MD  Multiple  Vitamin (MULTIVITAMIN WITH MINERALS) TABS Take 1 tablet by mouth daily.   Yes Historical Provider, MD  pravastatin (PRAVACHOL) 80 MG tablet Take 1 tablet (80 mg total) by mouth daily. For high cholesterol control 03/21/13  Yes Sanjuana Kava, NP  traMADol (ULTRAM) 50 MG tablet Take 50 mg by mouth 2 (two) times daily as needed (migraine headache.).    Yes Historical Provider, MD  vitamin B-12 (CYANOCOBALAMIN) 1000 MCG tablet Take 2,000 mcg by mouth daily. For Vitamin B-12 deficiency 03/21/13  Yes Sanjuana Kava, NP   Allergies  Allergen Reactions  . Abilify (Aripiprazole)     hyperglycemia    FAMILY HISTORY:  History reviewed. No pertinent family history. SOCIAL HISTORY:  reports that she has never smoked. She does not have any smokeless tobacco history on file. She reports that she does not drink alcohol or use illicit drugs.  REVIEW OF SYSTEMS:   Negative except above  SUBJECTIVE:   VITAL SIGNS: Temp:  [97.4 F (36.3 C)-99.5 F (37.5 C)] 97.4 F (36.3 C) (04/15 0800) Pulse Rate:  [73-97] 86 (04/15 0900) Resp:  [13-26] 19 (04/15 0900) BP: (108-152)/(70-108) 120/85 mmHg (04/15 0900) SpO2:  [87 %-100 %] 99 % (04/15 0900) Weight:  [172 lb 3 oz (78.104 kg)] 172 lb 3 oz (78.104 kg) (04/14 1713) 3 liters nasal cannula INTAKE / OUTPUT: Intake/Output     04/14 0701 - 04/15 0700 04/15 0701 - 04/16 0700   I.V. (mL/kg) 975 (12.5)    IV Piggyback 450 250   Total Intake(mL/kg) 1425 (18.2) 250 (3.2)   Net +1425 +250        Urine Occurrence 4 x      PHYSICAL EXAMINATION: General:  No distress Neuro:  Sleepy, easily awakens, normal strength, follows commands HEENT:  PERRLA, no sinus tenderness, MP 4, no LAN Cardiovascular:  s1s2 regular, no murmur Lungs:  Scattered rhonchi with faint b/l wheeze Abdomen:  Soft, non tender, normal bowel sounds Musculoskeletal:  No edema Skin:  No rashes  LABS:  Recent Labs Lab 03/31/13 1150 03/31/13 1200 03/31/13 1705 03/31/13 1731 03/31/13 1914  04/01/13 0020 04/01/13 0645  HGB 9.5* 12.6  --   --   --   --  8.6*  WBC 19.2*  --   --   --   --   --  19.3*  PLT 595*  --   --   --   --   --  540*  NA  --  140  --   --   --   --  137  K  --  4.6  --   --   --   --  5.1  CL  --  103  --   --   --   --  101  CO2  --   --   --   --   --   --  30  GLUCOSE  --  153*  --   --   --   --  159*  BUN  --  26*  --   --   --   --  26*  CREATININE  --  1.00  --   --   --   --  0.75  CALCIUM  --   --   --   --   --   --  8.6  MG  --   --   --   --  2.2  --   --   APTT  --   --   --   --  27  --   --   INR  --   --   --   --  1.11  --   --   TROPONINI  --   --   --  0.31*  --  <0.30 <0.30  PHART  --   --  7.322*  --   --   --   --   PCO2ART  --   --  59.0*  --   --   --   --   PO2ART  --   --  85.4  --   --   --   --     Recent Labs Lab 03/31/13 2218 04/01/13 0726  GLUCAP 149* 135*    Imaging: Dg Chest 2 View  03/31/2013  *RADIOLOGY REPORT*  Clinical Data: Cough and shortness of breath  CHEST - 2 VIEW  Comparison: 06/26/2006  Findings: The cardiac shadow is mildly enlarged.  Mild cephalization of the pulmonary vascularity is noted.  No focal infiltrate or sizable effusion is seen.  Findings consistent with a prior shunt are noted.  IMPRESSION: Mild cephalization of the pulmonary vasculature.  No other focal abnormality is seen.   Original Report Authenticated By: Alcide Clever, M.D.    Ct Angio Chest Pe W/cm &/or Wo Cm  03/31/2013  *RADIOLOGY REPORT*  Clinical Data: Cough, shortness of breath, recent pneumonia, pulmonary embolism, past history hypertension, diabetes  CT ANGIOGRAPHY CHEST  Technique:  Multidetector CT imaging of the chest using the standard protocol during bolus administration of intravenous contrast. Multiplanar reconstructed images including MIPs were obtained and reviewed to evaluate the vascular anatomy.  Contrast: OMNIPAQUE IOHEXOL 350 MG/ML SOLN  Comparison: None  Findings: Aorta normal caliber without aneurysm or  dissection. Dilated main pulmonary artery 3.4 x 3.5 cm diameter. Pulmonary arteries patent. No evidence of pulmonary embolism. No thoracic adenopathy. Enlargement of lateral segment left lobe liver. Remaining visualized portions of upper abdomen unremarkable. Scattered patchy infiltrates bilateral upper and lower lobes. No pleural or pericardial effusions. No acute osseous findings.  IMPRESSION: No evidence of pulmonary embolism. Scattered patchy bilateral pulmonary infiltrates. Dilated main pulmonary artery, which can be seen with pulmonary arterial hypertension.   Original Report Authenticated By: Ulyses Southward, M.D.     ASSESSMENT / PLAN:  PULMONARY A: Acute on chronic hypoxic, hypercapnic respiratory failure likely from PNA in setting of OSA/OHS. P:   -supplemental oxygen to keep SpO2 > 92% -auto BPAP qhs -f/u CXR 4/16 -continue albuterol -will d/c atrovent -add IS, flutter valve -will need outpt evaluation with PFT and sleep study  CARDIOVASCULAR A: Hypertension. Hx of hyperlipidemia. Enlarged PA on CT chest with possible pulmonary hypertension. P:  -BP control per primary team and cardiology -continue zocor -f/u Echo -goal negative fluid balance >> give lasix 40 mg x 1 on 4/15  RENAL A:  No acute issues. P:   -monitor renal fx, urine outpt, electrolytes  GASTROINTESTINAL A:  Nutrition. P:   -carb modified diet  HEMATOLOGIC A:  Anemia. P:  -f/u CBC  INFECTIOUS A:  Community aquired pneumonia with failed outpt Abx. P:   -continue cefepime, levaquin, vancomycin for now >> likely can narrow Abx coverage soon  ENDOCRINE A:  DM type II. P:   -SSI -check TSH  NEUROLOGIC A:  Hx of Bipolar. Hx of hydrocephalus as infant s/p VP shunt. P:   -depakote, lamictal per primary team  Coralyn Helling, MD [facility] Pulmonary/Critical Care 04/01/2013, 9:57 AM Pager:  [phone] After 3pm call: [phone]

## 2013-04-01 NOTE — Progress Notes (Addendum)
CARDIOLOGY CONSULT NOTE  Patient ID: [name], MRN: [id number], DOB/AGE: November 13, 1971 42 y.o. Admit date: 03/31/2013 Date of Consult: 04/01/2013  Primary Physician: Juline Patch, MD Primary Cardiologist: new to [facility]  Chief Complaint: cough + tn   HPI [name] is a 42 y.o. female  with a diagnosis of pneumonia. She was found to have an abnormal electrocardiogram with diffuse T-wave inversions including the inferior leads and V2-V6 with a borderline positive troponin of 0.31.  She has a-four-week history of progressive dyspnea mostly with exertion. It has been accompanied by a cough productive of clear sputum. She has had no fevers. She's had no recumbent dyspnea; there has been no peripheral edema. She denies exertional chest discomfort.  She was treated with her PCP for presumptive diagnosis of pneumonia/asthma with antibiotics which she did not take as well as nebulizers and steroids. As noted, her white count on presentation was 19,000.  She was found to have significant hypoxemia at her PCP and was sent to the emergency room. Evaluation includes the aforementioned white count, notably in the wake of steroids, a chest x-ray demonstrating bilateral infiltrates with some cephalization, and a CT scan negative for pulmonary embolism but showing bilateral infiltrates.  Her cardiac enzymes are negative.   Echo results are pending.      Past Medical History  Diagnosis Date  . Hypertension   . Diabetes mellitus without complication   . Hyperlipidemia 03/31/2013  . Bipolar disorder 03/31/2013  . Diabetes 03/31/2013  . Vitamin D deficiency 03/31/2013  . Anemia microcytic 03/31/2013  . Rhabdomyolysis 03/31/2013    [facility] secondary to abilify      Surgical History:  Past Surgical History  Procedure Laterality Date  . Ventriculoperitoneal shunt    . Cesarean section    . Cyst excision       Home Meds: Prior to Admission medications   Medication Sig Start Date End Date Taking?  Authorizing Provider  Asenapine Maleate 10 MG SUBL Place 1 tablet under the tongue at bedtime as needed (insomnia).    Yes Historical Provider, MD  aspirin 81 MG tablet Take 1 tablet (81 mg total) by mouth daily. For heart health 03/21/13  Yes Sanjuana Kava, NP  benzonatate (TESSALON) 200 MG capsule Take 200 mg by mouth 3 (three) times daily as needed for cough.   Yes Historical Provider, MD  cloNIDine (CATAPRES - DOSED IN MG/24 HR) 0.1 mg/24hr patch Place 1 patch onto the skin once a week. Applied for the first time on 03/30/13.   Yes Historical Provider, MD  divalproex (DEPAKOTE ER) 500 MG 24 hr tablet Take 500 mg by mouth daily.   Yes Historical Provider, MD  hydrochlorothiazide (HYDRODIURIL) 25 MG tablet Take 1 tablet (25 mg total) by mouth daily. For high blood pressure 03/21/13  Yes Sanjuana Kava, NP  lamoTRIgine (LAMICTAL) 200 MG tablet Take 200 mg by mouth daily. For mood stabilization.   Yes Historical Provider, MD  lisinopril (PRINIVIL,ZESTRIL) 5 MG tablet Take 20 mg by mouth daily.    Yes Historical Provider, MD  metFORMIN (GLUCOPHAGE) 1000 MG tablet Take 1 tablet (1,000 mg total) by mouth daily with breakfast. For high blood sugar control 03/21/13  Yes Sanjuana Kava, NP  Methylsulfonylmethane (MSM PO) Take 2 tablets by mouth daily.    Yes Historical Provider, MD  Multiple Vitamin (MULTIVITAMIN WITH MINERALS) TABS Take 1 tablet by mouth daily.   Yes Historical Provider, MD  pravastatin (PRAVACHOL) 80 MG tablet Take 1 tablet (80 mg  total) by mouth daily. For high cholesterol control 03/21/13  Yes Sanjuana Kava, NP  traMADol (ULTRAM) 50 MG tablet Take 50 mg by mouth 2 (two) times daily as needed (migraine headache.).    Yes Historical Provider, MD  vitamin B-12 (CYANOCOBALAMIN) 1000 MCG tablet Take 2,000 mcg by mouth daily. For Vitamin B-12 deficiency 03/21/13  Yes Sanjuana Kava, NP    Inpatient Medications:  . albuterol  2.5 mg Nebulization TID  . antiseptic oral rinse  15 mL Mouth Rinse BID  .  aspirin EC  81 mg Oral Daily  . benzonatate  200 mg Oral TID  . ceFEPime (MAXIPIME) IV  1 g Intravenous Q8H  . divalproex  500 mg Oral Daily  . enoxaparin (LOVENOX) injection  40 mg Subcutaneous Q24H  . insulin aspart  0-15 Units Subcutaneous TID WC  . ipratropium  0.5 mg Nebulization TID  . lamoTRIgine  100 mg Oral Daily  . levofloxacin  750 mg Intravenous Q24H  . lisinopril  20 mg Oral Daily  . metoprolol tartrate  12.5 mg Oral BID  . multivitamin with minerals  1 tablet Oral Daily  . pantoprazole  40 mg Oral Daily  . simvastatin  40 mg Oral q1800  . vancomycin  1,000 mg Intravenous Q12H  . vitamin B-12  1,000 mcg Oral Daily    Allergies:  Allergies  Allergen Reactions  . Abilify (Aripiprazole)     hyperglycemia    History   Social History  . Marital Status: Married    Spouse Name: N/A    Number of Children: N/A  . Years of Education: N/A   Occupational History  . Not on file.   Social History Main Topics  . Smoking status: Never Smoker   . Smokeless tobacco: Not on file  . Alcohol Use: No  . Drug Use: No  . Sexually Active: Not on file   Other Topics Concern  . Not on file   Social History Narrative  . No narrative on file     History reviewed. No pertinent family history.     Physical Exam:   Blood pressure 122/80, pulse 78, temperature 97.8 F (36.6 C), temperature source Oral, resp. rate 19, height 4\' 9"  (1.448 m), weight 172 lb 3 oz (78.104 kg), last menstrual period 03/13/2013, SpO2 98.00%. General: Well developed, well nourished african [place] age appearing female in no acute distress. Head: Normocephalic, atraumatic, sclera non-icteric, no xanthomas, nares are without discharge. EENT: normal  Lymph Nodes:  none Neck: Negative for carotid bruits. JVD 8-9 cm Back:without scoliosis kyphosis Lungs: Clear bilaterally to auscultation without wheezes, rales, or rhonchi. Breathing is unlabored. Heart: RRR with S1 S2. No   murmur . No rubs, or  gallops appreciated. Abdomen: Soft, non-tender, non-distended with normoactive bowel sounds. No hepatomegaly. No rebound/guarding. No obvious abdominal masses. Msk:  Strength and tone appear normal for age. Extremities: No clubbing or cyanosis. No edema.  Distal pedal pulses are 2+ and equal bilaterally. Skin: Warm and Dry Neuro: Alert and oriented X 3. CN III-XII intact Grossly normal sensory and motor function . Psych:  Responds to questions appropriately with a normal affect.      Labs: Cardiac Enzymes  Recent Labs  03/31/13 1731 04/01/13 0020 04/01/13 0645  TROPONINI 0.31* <0.30 <0.30   CBC Lab Results  Component Value Date   WBC 19.3* 04/01/2013   HGB 8.6* 04/01/2013   HCT 32.3* 04/01/2013   MCV 76.5* 04/01/2013   PLT 540* 04/01/2013  PROTIME:  Recent Labs  03/31/13 1914  LABPROT 14.2  INR 1.11   Chemistry   Recent Labs Lab 04/01/13 0645  NA 137  K 5.1  CL 101  CO2 30  BUN 26*  CREATININE 0.75  CALCIUM 8.6  GLUCOSE 159*   Lipids No results found for this basename: CHOL,  HDL,  LDLCALC,  TRIG   BNP No results found for this basename: probnp   Miscellaneous No results found for this basename: DDIMER    Radiology/Studies:  Dg Chest 2 View  03/31/2013  *RADIOLOGY REPORT*  Clinical Data: Cough and shortness of breath  CHEST - 2 VIEW  Comparison: 06/26/2006  Findings: The cardiac shadow is mildly enlarged.  Mild cephalization of the pulmonary vascularity is noted.  No focal infiltrate or sizable effusion is seen.  Findings consistent with a prior shunt are noted.  IMPRESSION: Mild cephalization of the pulmonary vasculature.  No other focal abnormality is seen.   Original Report Authenticated By: Alcide Clever, M.D.    Ct Angio Chest Pe W/cm &/or Wo Cm  03/31/2013  *RADIOLOGY REPORT*  Clinical Data: Cough, shortness of breath, recent pneumonia, pulmonary embolism, past history hypertension, diabetes  CT ANGIOGRAPHY CHEST  Technique:  Multidetector CT imaging  of the chest using the standard protocol during bolus administration of intravenous contrast. Multiplanar reconstructed images including MIPs were obtained and reviewed to evaluate the vascular anatomy.  Contrast: OMNIPAQUE IOHEXOL 350 MG/ML SOLN  Comparison: None  Findings: Aorta normal caliber without aneurysm or dissection. Dilated main pulmonary artery 3.4 x 3.5 cm diameter. Pulmonary arteries patent. No evidence of pulmonary embolism. No thoracic adenopathy. Enlargement of lateral segment left lobe liver. Remaining visualized portions of upper abdomen unremarkable. Scattered patchy infiltrates bilateral upper and lower lobes. No pleural or pericardial effusions. No acute osseous findings.  IMPRESSION: No evidence of pulmonary embolism. Scattered patchy bilateral pulmonary infiltrates. Dilated main pulmonary artery, which can be seen with pulmonary arterial hypertension.   Original Report Authenticated By: Ulyses Southward, M.D.      Assessment and Plan:   Principal Problem:   Acute respiratory failure with hypoxia Active Problems:   Bipolar I disorder, most recent episode (or current) manic   HCAP (healthcare-associated pneumonia)   Abnormal EKG   Leukocytosis   HTN (hypertension)   Hyperlipidemia   Diabetes   Vitamin D deficiency   Anemia   Elevated troponin  The patient presents with shortness of breath over the last 3 weeks, chest x-ray with bilateral infiltrates, elevated white count (on steroids), the presumptive diagnosis of pneumonia in the absence of a fever with an abnormal electrocardiogram.  Subsequent Troponin levels are normal.  I doubt her dyspnea is due to a  cardiac etiology.  Echo today to look at [facility].   She has had a stress test in the past.  She can see [place] in the office for a stress test as OP.    She will see Dr. Graciela Husbands.   She may be moved to tele if needed from our standpoint.   Vesta Mixer, Montez Hageman., MD, [facility] 04/01/2013, 7:51 AM Office - [phone] Pager  [phone]

## 2013-04-02 ENCOUNTER — Inpatient Hospital Stay (HOSPITAL_COMMUNITY): Payer: 59

## 2013-04-02 DIAGNOSIS — I10 Essential (primary) hypertension: Secondary | ICD-10-CM

## 2013-04-02 DIAGNOSIS — D649 Anemia, unspecified: Secondary | ICD-10-CM

## 2013-04-02 DIAGNOSIS — I5032 Chronic diastolic (congestive) heart failure: Secondary | ICD-10-CM | POA: Diagnosis not present

## 2013-04-02 DIAGNOSIS — E119 Type 2 diabetes mellitus without complications: Secondary | ICD-10-CM

## 2013-04-02 DIAGNOSIS — E662 Morbid (severe) obesity with alveolar hypoventilation: Secondary | ICD-10-CM | POA: Diagnosis present

## 2013-04-02 LAB — GLUCOSE, CAPILLARY
Glucose-Capillary: 154 mg/dL — ABNORMAL HIGH (ref 70–99)
Glucose-Capillary: 138 mg/dL — ABNORMAL HIGH (ref 70–99)

## 2013-04-02 LAB — PRO B NATRIURETIC PEPTIDE: Pro B Natriuretic peptide (BNP): 378.7 pg/mL — ABNORMAL HIGH (ref 0–125)

## 2013-04-02 LAB — CBC WITH DIFFERENTIAL/PLATELET
Basophils Absolute: 0 10*3/uL (ref 0.0–0.1)
Basophils Relative: 0 % (ref 0–1)
Eosinophils Absolute: 0.2 10*3/uL (ref 0.0–0.7)
Eosinophils Relative: 1 % (ref 0–5)
HCT: 30.8 % — ABNORMAL LOW (ref 36.0–46.0)
MCHC: 26.3 g/dL — ABNORMAL LOW (ref 30.0–36.0)
MCV: 77.2 fL — ABNORMAL LOW (ref 78.0–100.0)
Monocytes Absolute: 2 10*3/uL — ABNORMAL HIGH (ref 0.1–1.0)
Platelets: 428 10*3/uL — ABNORMAL HIGH (ref 150–400)
RDW: 19.7 % — ABNORMAL HIGH (ref 11.5–15.5)
WBC: 16.2 10*3/uL — ABNORMAL HIGH (ref 4.0–10.5)

## 2013-04-02 LAB — URINE CULTURE
Colony Count: NO GROWTH
Culture: NO GROWTH

## 2013-04-02 LAB — PROCALCITONIN: Procalcitonin: <0.1 ng/mL

## 2013-04-02 LAB — STREP PNEUMONIAE URINARY ANTIGEN: Strep Pneumo Urinary Antigen: NEGATIVE

## 2013-04-02 LAB — LEGIONELLA ANTIGEN, URINE: Legionella Antigen, Urine: NEGATIVE

## 2013-04-02 LAB — BASIC METABOLIC PANEL
BUN: 20 mg/dL (ref 6–23)
CO2: 35 mEq/L — ABNORMAL HIGH (ref 19–32)
Calcium: 8.2 mg/dL — ABNORMAL LOW (ref 8.4–10.5)
Chloride: 98 mEq/L (ref 96–112)
Creatinine, Ser: 0.84 mg/dL (ref 0.50–1.10)

## 2013-04-02 LAB — TSH: TSH: 3.595 u[IU]/mL (ref 0.350–4.500)

## 2013-04-02 MED ORDER — FUROSEMIDE 10 MG/ML IJ SOLN
INTRAMUSCULAR | Status: AC
  Administered 2013-04-02: 60 mg via INTRAVENOUS
  Filled 2013-04-02: qty 8

## 2013-04-02 MED ORDER — FUROSEMIDE 10 MG/ML IJ SOLN
60.0000 mg | Freq: Two times a day (BID) | INTRAMUSCULAR | Status: DC
  Filled 2013-04-02 (×3): qty 6

## 2013-04-02 MED ORDER — POTASSIUM CHLORIDE CRYS ER 20 MEQ PO TBCR
20.0000 meq | EXTENDED_RELEASE_TABLET | Freq: Once | ORAL | Status: AC
  Administered 2013-04-02: 20 meq via ORAL
  Filled 2013-04-02: qty 1

## 2013-04-02 NOTE — Progress Notes (Signed)
CARDIOLOGY CONSULT NOTE  Patient ID: [name], MRN: [id number], DOB/AGE: [birth year] 42 y.o. Admit date: 03/31/2013 Date of Consult: 04/02/2013  Primary Physician: Stacy Patch, MD Primary Cardiologist: new to [name]  Chief Complaint: cough + tn   HPI [name] is a 42 y.o. female  with a diagnosis of pneumonia. She was found to have an abnormal electrocardiogram with diffuse T-wave inversions including the inferior leads and V2-V6 with a borderline positive troponin of 0.31.  She has a-four-week history of progressive dyspnea mostly with exertion. It has been accompanied by a cough productive of clear sputum. She has had no fevers. She's had no recumbent dyspnea; there has been no peripheral edema. She denies exertional chest discomfort.  She was treated with her PCP for presumptive diagnosis of pneumonia/asthma with antibiotics which she did not take as well as nebulizers and steroids. As noted, her white count on presentation was 19,000.  She was found to have significant hypoxemia at her PCP and was sent to the emergency room. Evaluation includes the aforementioned white count, notably in the wake of steroids, a chest x-ray demonstrating bilateral infiltrates with some cephalization, and a CT scan negative for pulmonary embolism but showing bilateral infiltrates.  Her cardiac enzymes are negative.   Echo shows normal LV systolic function with EF 55%.  Possible diastolic dysfunction ( difficult study)       Past Medical History  Diagnosis Date  . Hypertension   . Diabetes mellitus without complication   . Hyperlipidemia 03/31/2013  . Bipolar disorder 03/31/2013  . Diabetes 03/31/2013  . Vitamin D deficiency 03/31/2013  . Anemia microcytic 03/31/2013  . Rhabdomyolysis 03/31/2013    [facility] secondary to abilify  . OSA (obstructive sleep apnea)       Surgical History:  Past Surgical History  Procedure Laterality Date  . Ventriculoperitoneal shunt    . Cesarean section    .  Cyst excision       Home Meds: Prior to Admission medications   Medication Sig Start Date End Date Taking? Authorizing Provider  Asenapine Maleate 10 MG SUBL Place 1 tablet under the tongue at bedtime as needed (insomnia).    Yes Historical Provider, MD  aspirin 81 MG tablet Take 1 tablet (81 mg total) by mouth daily. For heart health 03/21/13  Yes Stacy Kava, NP  benzonatate (TESSALON) 200 MG capsule Take 200 mg by mouth 3 (three) times daily as needed for cough.   Yes Historical Provider, MD  cloNIDine (CATAPRES - DOSED IN MG/24 HR) 0.1 mg/24hr Miller Place 1 Miller onto the skin once a week. Applied for the first time on 03/30/13.   Yes Historical Provider, MD  divalproex (DEPAKOTE ER) 500 MG 24 hr tablet Take 500 mg by mouth daily.   Yes Historical Provider, MD  hydrochlorothiazide (HYDRODIURIL) 25 MG tablet Take 1 tablet (25 mg total) by mouth daily. For high blood pressure 03/21/13  Yes Stacy Kava, NP  lamoTRIgine (LAMICTAL) 200 MG tablet Take 200 mg by mouth daily. For mood stabilization.   Yes Historical Provider, MD  lisinopril (PRINIVIL,ZESTRIL) 5 MG tablet Take 20 mg by mouth daily.    Yes Historical Provider, MD  metFORMIN (GLUCOPHAGE) 1000 MG tablet Take 1 tablet (1,000 mg total) by mouth daily with breakfast. For high blood sugar control 03/21/13  Yes Stacy Kava, NP  Methylsulfonylmethane (MSM PO) Take 2 tablets by mouth daily.    Yes Historical Provider, MD  Multiple Vitamin (MULTIVITAMIN WITH MINERALS) TABS Take 1 tablet  by mouth daily.   Yes Historical Provider, MD  pravastatin (PRAVACHOL) 80 MG tablet Take 1 tablet (80 mg total) by mouth daily. For high cholesterol control 03/21/13  Yes Stacy Kava, NP  traMADol (ULTRAM) 50 MG tablet Take 50 mg by mouth 2 (two) times daily as needed (migraine headache.).    Yes Historical Provider, MD  vitamin B-12 (CYANOCOBALAMIN) 1000 MCG tablet Take 2,000 mcg by mouth daily. For Vitamin B-12 deficiency 03/21/13  Yes Stacy Kava, NP     Inpatient Medications:  . albuterol  2.5 mg Nebulization TID  . antiseptic oral rinse  15 mL Mouth Rinse BID  . asenapine  10 mg Sublingual QHS  . aspirin EC  81 mg Oral Daily  . ceFEPime (MAXIPIME) IV  1 g Intravenous Q8H  . divalproex  500 mg Oral Daily  . enoxaparin (LOVENOX) injection  40 mg Subcutaneous Q24H  . guaiFENesin  1,200 mg Oral BID  . insulin aspart  0-15 Units Subcutaneous TID WC  . lamoTRIgine  100 mg Oral Daily  . levofloxacin  750 mg Intravenous Q24H  . lisinopril  20 mg Oral Daily  . metoprolol tartrate  12.5 mg Oral BID  . multivitamin with minerals  1 tablet Oral Daily  . pantoprazole  40 mg Oral Daily  . simvastatin  40 mg Oral q1800  . vancomycin  1,000 mg Intravenous Q12H  . vitamin B-12  1,000 mcg Oral Daily    Allergies:  Allergies  Allergen Reactions  . Abilify (Aripiprazole)     hyperglycemia    History   Social History  . Marital Status: Married    Spouse Name: N/A    Number of Children: N/A  . Years of Education: N/A   Occupational History  . [employer] service    Social History Main Topics  . Smoking status: Never Smoker   . Smokeless tobacco: Never Used  . Alcohol Use: No  . Drug Use: No  . Sexually Active: No   Other Topics Concern  . Not on file   Social History Narrative  . No narrative on file     History reviewed. No pertinent family history.     Physical Exam:   Blood pressure 123/51, pulse 82, temperature 100.4 F (38 C), temperature source Oral, resp. rate 26, height 4\' 9"  (1.448 m), weight 184 lb 1.4 oz (83.5 kg), last menstrual period 03/13/2013, SpO2 98.00%. General: Well developed, well nourished african [place] age appearing female in no acute distress. Head: Normocephalic, atraumatic, sclera non-icteric, no xanthomas, nares are without discharge. Neck: Negative for carotid bruits.  No JVD  Back:without scoliosis kyphosis Lungs: moderately decreased breath sounds - sounds like she is not able to move much  air despite significant effort. Heart: RRR with S1 S2. No   murmur . No rubs, or gallops appreciated. Abdomen: Soft, non-tender, non-distended with normoactive bowel sounds. No hepatomegaly. No rebound/guarding. No obvious abdominal masses. Msk:  Strength and tone appear normal for age. Extremities: No clubbing or cyanosis. No edema.  Distal pedal pulses are 2+ and equal bilaterally. Skin: Warm and Dry Neuro: Alert and oriented X 3. CN III-XII intact Grossly normal sensory and motor function . Psych:  Responds to questions appropriately with a normal affect.      Labs: Cardiac Enzymes  Recent Labs  03/31/13 1731 04/01/13 0020 04/01/13 0645  TROPONINI 0.31* <0.30 <0.30   CBC Lab Results  Component Value Date   WBC 16.2* 04/02/2013   HGB 8.1* 04/02/2013  HCT 30.8* 04/02/2013   MCV 77.2* 04/02/2013   PLT 428* 04/02/2013   PROTIME:  Recent Labs  03/31/13 1914  LABPROT 14.2  INR 1.11   Chemistry   Recent Labs Lab 04/02/13 0338  NA 136  K 4.8  CL 98  CO2 35*  BUN 20  CREATININE 0.84  CALCIUM 8.2*  GLUCOSE 182*   Lipids Lab Results  Component Value Date   CHOL 146 04/01/2013   BNP Pro B Natriuretic peptide (BNP)  Date/Time Value Range Status  04/01/2013  6:45 AM 928.0* 0 - 125 pg/mL Final   Miscellaneous No results found for this basename: DDIMER    Radiology/Studies:  Dg Chest 2 View  03/31/2013  *RADIOLOGY REPORT*  Clinical Data: Cough and shortness of breath  CHEST - 2 VIEW  Comparison: 06/26/2006  Findings: The cardiac shadow is mildly enlarged.  Mild cephalization of the pulmonary vascularity is noted.  No focal infiltrate or sizable effusion is seen.  Findings consistent with a prior shunt are noted.  IMPRESSION: Mild cephalization of the pulmonary vasculature.  No other focal abnormality is seen.   Original Report Authenticated By: Alcide Clever, M.D.    Ct Angio Chest Pe W/cm &/or Wo Cm  03/31/2013  *RADIOLOGY REPORT*  Clinical Data: Cough, shortness of  breath, recent pneumonia, pulmonary embolism, past history hypertension, diabetes  CT ANGIOGRAPHY CHEST  Technique:  Multidetector CT imaging of the chest using the standard protocol during bolus administration of intravenous contrast. Multiplanar reconstructed images including MIPs were obtained and reviewed to evaluate the vascular anatomy.  Contrast: OMNIPAQUE IOHEXOL 350 MG/ML SOLN  Comparison: None  Findings: Aorta normal caliber without aneurysm or dissection. Dilated main pulmonary artery 3.4 x 3.5 cm diameter. Pulmonary arteries patent. No evidence of pulmonary embolism. No thoracic adenopathy. Enlargement of lateral segment left lobe liver. Remaining visualized portions of upper abdomen unremarkable. Scattered patchy infiltrates bilateral upper and lower lobes. No pleural or pericardial effusions. No acute osseous findings.  IMPRESSION: No evidence of pulmonary embolism. Scattered patchy bilateral pulmonary infiltrates. Dilated main pulmonary artery, which can be seen with pulmonary arterial hypertension.   Original Report Authenticated By: Ulyses Southward, M.D.      Assessment and Plan:   Principal Problem:   Acute respiratory failure with hypoxia - I suspect this is due to pneumonia.  I am also concerned with her poor air movement.   Her CXR shows bilateral fluffy infiltrates.  The echo shows normal LV systolic function with a ? Of diastolic dysfunction.   The Troponin levels are normal.  I suggest aggressive treatment of her pulmonary issues.   She can come to the office and get an outpatient stress test with Dr. Graciela Miller .  Will sign off.  Call for questions.   Other diagnosis    Bipolar I disorder, most recent episode (or current) manic   Abnormal EKG   Leukocytosis   HTN (hypertension)   Hyperlipidemia   Diabetes   Vitamin D deficiency   Anemia   [name], Montez Hageman., MD, [facility] 04/02/2013, 8:05 AM Office - [phone] Pager [phone]

## 2013-04-02 NOTE — Consult Note (Signed)
PULMONARY  / CRITICAL CARE MEDICINE  Name: [name] MRN: [id number] DOB: 1971/11/08    ADMISSION DATE:  03/31/2013 CONSULTATION DATE:  04/01/2013   REFERRING MD :  Ramiro Harvest  CHIEF COMPLAINT:  Short of breath  BRIEF PATIENT DESCRIPTION:  42 y/o never smoker admitted with productive cough and dyspnea.  Found to have pneumonia.  PCCM consulted to assist with management of respiratory failure.  SIGNIFICANT EVENTS: 4/16 Tm 101.3  STUDIES:  4/14 Echo >>EF 55%, Grade 2 Diastolic Dysfunction, no valvular abnormalities 4/14 CT chest >> dilated PA, no PE, patchy b/l infiltrates  LINES / TUBES: PIV  CULTURES: 4/14 Legionella Ag >> 4/14 Pneumococcal Ag >> negative 4/14 HIV >> negative 4/14 Blood >>   ANTIBIOTICS: 4/14 Cefepime >> 4/14 Levaquin >> 4/14 Vancomycin >>    SUBJECTIVE: Pt reports moist cough with minimal sputum clearance  VITAL SIGNS: Temp:  [98.5 F (36.9 C)-101.3 F (38.5 C)] 100.4 F (38 C) (04/16 0400) Pulse Rate:  [75-95] 82 (04/16 0500) Resp:  [19-27] 26 (04/16 0500) BP: (101-133)/(51-85) 123/51 mmHg (04/16 0400) SpO2:  [85 %-100 %] 98 % (04/16 0748) Weight:  [184 lb 1.4 oz (83.5 kg)] 184 lb 1.4 oz (83.5 kg) (04/16 0400) 3 liters nasal cannula  INTAKE / OUTPUT: Intake/Output     04/15 0701 - 04/16 0700 04/16 0701 - 04/17 0700   P.O. 480    I.V. (mL/kg) 375 (4.5)    IV Piggyback 700    Total Intake(mL/kg) 1555 (18.6)    Urine (mL/kg/hr) 650 (0.3)    Total Output 650     Net +905          Urine Occurrence 4 x      PHYSICAL EXAMINATION: General:  No distress Neuro:  Sleepy, easily awakens, normal strength, follows commands HEENT:  PERRLA, no sinus tenderness, MP 4, no LAN Cardiovascular:  s1s2 regular, no murmur Lungs:  resp's even/non-labored, lungs bilaterally diminished / coarse Abdomen:  Soft, non tender, normal bowel sounds Musculoskeletal:  No edema Skin:  No rashes  LABS:  Recent Labs Lab 03/31/13 1150  03/31/13 1200  03/31/13 1705 03/31/13 1731 03/31/13 1914 04/01/13 0020 04/01/13 0645 04/02/13 0338  HGB 9.5*  --  12.6  --   --   --   --  8.6* 8.1*  WBC 19.2*  --   --   --   --   --   --  19.3* 16.2*  PLT 595*  --   --   --   --   --   --  540* 428*  NA  --   --  140  --   --   --   --  137 136  K  --   < > 4.6  --   --   --   --  5.1 4.8  CL  --   --  103  --   --   --   --  101 98  CO2  --   --   --   --   --   --   --  30 35*  GLUCOSE  --   --  153*  --   --   --   --  159* 182*  BUN  --   --  26*  --   --   --   --  26* 20  CREATININE  --   --  1.00  --   --   --   --  0.75 0.84  CALCIUM  --   --   --   --   --   --   --  8.6 8.2*  MG  --   --   --   --   --  2.2  --   --   --   APTT  --   --   --   --   --  27  --   --   --   INR  --   --   --   --   --  1.11  --   --   --   TROPONINI  --   --   --   --  0.31*  --  <0.30 <0.30  --   PROBNP  --   --   --   --   --   --   --  928.0*  --   PHART  --   --   --  7.322*  --   --   --   --   --   PCO2ART  --   --   --  59.0*  --   --   --   --   --   PO2ART  --   --   --  85.4  --   --   --   --   --   < > = values in this interval not displayed.  Recent Labs Lab 04/01/13 0726 04/01/13 1158 04/01/13 1611 04/01/13 2131 04/02/13 0749  GLUCAP 135* 124* 106* 130* 154*    Imaging: Dg Chest 2 View  03/31/2013  *RADIOLOGY REPORT*  Clinical Data: Cough and shortness of breath  CHEST - 2 VIEW  Comparison: 06/26/2006  Findings: The cardiac shadow is mildly enlarged.  Mild cephalization of the pulmonary vascularity is noted.  No focal infiltrate or sizable effusion is seen.  Findings consistent with a prior shunt are noted.  IMPRESSION: Mild cephalization of the pulmonary vasculature.  No other focal abnormality is seen.   Original Report Authenticated By: Alcide Clever, M.D.    Ct Angio Chest Pe W/cm &/or Wo Cm  03/31/2013  *RADIOLOGY REPORT*  Clinical Data: Cough, shortness of breath, recent pneumonia, pulmonary embolism, past history hypertension,  diabetes  CT ANGIOGRAPHY CHEST  Technique:  Multidetector CT imaging of the chest using the standard protocol during bolus administration of intravenous contrast. Multiplanar reconstructed images including MIPs were obtained and reviewed to evaluate the vascular anatomy.  Contrast: OMNIPAQUE IOHEXOL 350 MG/ML SOLN  Comparison: None  Findings: Aorta normal caliber without aneurysm or dissection. Dilated main pulmonary artery 3.4 x 3.5 cm diameter. Pulmonary arteries patent. No evidence of pulmonary embolism. No thoracic adenopathy. Enlargement of lateral segment left lobe liver. Remaining visualized portions of upper abdomen unremarkable. Scattered patchy infiltrates bilateral upper and lower lobes. No pleural or pericardial effusions. No acute osseous findings.  IMPRESSION: No evidence of pulmonary embolism. Scattered patchy bilateral pulmonary infiltrates. Dilated main pulmonary artery, which can be seen with pulmonary arterial hypertension.   Original Report Authenticated By: Ulyses Southward, M.D.    Dg Chest Port 1 View  04/02/2013  *RADIOLOGY REPORT*  Clinical Data: Pneumonia.  PORTABLE CHEST - 1 VIEW  Comparison: 04/01/2013.  Findings: Trachea is midline.  Heart size stable.  A ventriculoperitoneal shunt catheter projects over the right chest. There is mild diffuse bilateral air space disease, possibly slightly worsened from 04/01/2013.  No definite pleural fluid.  IMPRESSION: Mild diffuse bilateral air  space disease may be due to edema or pneumonia, with suspected slight interval worsening from 04/01/2013.   Original Report Authenticated By: Leanna Battles, M.D.    Dg Chest Port 1 View  04/01/2013  *RADIOLOGY REPORT*  Clinical Data: Shortness of breath and weakness.  Follow-up pneumonia.  PORTABLE CHEST - 1 VIEW  Comparison: Yesterday's chest CT and radiographs.  Findings: The cardiac silhouette remains mildly enlarged.  No significant change in mild ill-defined opacity in both lungs.  No visible  pleural fluid.  The right ventriculoperitoneal shunt tubing is poorly visualized inferiorly.  On the chest CT, the inferior portion of the tubing terminated in the anterior epicardial/upper abdominal fat.  Unremarkable bones.  IMPRESSION:  1.  Stable mild cardiomegaly and mild bilateral pneumonia. 2.  Ventriculoperitoneal shunt tubing terminating in the anterior epicardial/upper abdominal fat.   Original Report Authenticated By: Beckie Salts, M.D.     ASSESSMENT / PLAN:  PULMONARY A: Acute on chronic hypoxic, hypercapnic respiratory failure likely from PNA in setting of OSA/OHS and acute on chronic diastolic heart failure. P:   -supplemental oxygen to keep SpO2 > 92% -auto BPAP qhs -f/u CXR 4/17 -continue albuterol -IS, flutter valve -will need outpt evaluation with PFT and sleep study -check ESR   CARDIOVASCULAR A: Hypertension with acute on chronic diastolic dysfx. Hx of hyperlipidemia. Enlarged PA on CT chest with possible pulmonary hypertension >> normal RV function on Echo. P:  -continue asa, lisinopril, metoprolol, zocor -goal negative fluid balance >> give lasix 60 mg BID x2 doses 4/16  RENAL A:  No acute issues. P:   -monitor renal fx, urine outpt, electrolytes -KCL 20 mEq with lasix x1 4/16  GASTROINTESTINAL A:  Nutrition. P:   -carb modified diet  HEMATOLOGIC A:  Anemia. P:  -f/u CBC -check iron levels  INFECTIOUS A:  Community aquired pneumonia with failed outpt Abx. P:   -continue cefepime, levaquin, vancomycin for now >> likely can narrow Abx coverage soon -check PCT  ENDOCRINE A:  DM type II. P:   -SSI -check TSH  NEUROLOGIC A:  Hx of Bipolar. Hx of hydrocephalus as infant s/p VP shunt. P:   -depakote, lamictal, saphris  Canary Brim, NP-C [facility] Pgr: [phone] or [phone]   Reviewed, above examined pt, and and agree with assessment/plan.  Continue Abx and negative fluid balance.  Continue nocturnal BiPAP and  nebulizer Tx.  ?if she could have BOOP >> will check ESR.  Will change service to PCCM.  Keep in SDU for today.  Coralyn Helling, MD [facility] 04/02/2013, 9:21 AM Pager:  [phone] After 3pm call: [phone]

## 2013-04-02 NOTE — Progress Notes (Signed)
Pt complained of feeling smothered/claustrophobic with FFM, placed pt on with nasal mask. Tolerating well at this time

## 2013-04-02 NOTE — Progress Notes (Signed)
[facility] PROGRESS NOTE  [name] UEA:[id number] DOB: 1971-06-22 DOA: 03/31/2013 PCP: Juline Patch, MD  Assessment/Plan: #1 acute respiratory distress with hypoxia/??volume overload Likely multifactorial secondary to healthcare associated pneumonia/pulmonary infiltrates bilaterally noted on CT of the chest and probable cardiac etiology as patient had abnormal EKG changes versus volume overload. Urine Legionella antigen is pending. Urine pneumococcus antigen is negative. Initial set of cardiac enzymes were borderline elevated troponin however subsequent troponins negative x2. 2-D echo with EF 55% with moderate diastolic dysfunction. Continue IV vancomycin, Levaquin, cefepime. Continue aspirin, oxygen, Lopressor, nebulizer treatments, Mucinex, flutter vlave, IS. Patient s/p lasix 40mg  IV x1. bnp yesterday at 928. Will give give lasix 60mg  IV q 12 hrs and repeat CXR in AM. Patient could not tolerate BIPAP last night. Pulm ff.   #2 probable healthcare associated pneumonia CT of the chest showed bilateral pulmonary infiltrates. Patient with a leukocytosis which is starting to trend down. Patient with fevers overnight.  Sputum Gram stain and cultures pending. Urine Legionella antigen is pending. Urine pneumococcus antigen is negative. Blood cultures are pending. Continue empiric IV vancomycin, Levaquin, cefepime, Mucinex, nebulizer treatments, IS, flutter valve. Pulmonary ff. Follow.  #3 EKG changes Initial set of troponin borderline elevated at 0.31. Subsequent sets of troponin negative x2. Patient with multiple cardiac risk factors including diabetes, hypertension, hyperlipidemia. 2-D echo with EF = 55%, with moderate diastolic dysfunction.  Continue aspirin, oxygen, nitroglycerin, statin, ACE inhibitor, Lopressor. Per cardiology probable outpatient stress test. Cardiology is following and appreciate input and recommendations.  #4 leukocytosis Likely secondary to problem #2. Patient was also  given a dose of IV steroids her PCPs office 3 days prior to admission. WBC trending down. Blood cultures are pending. Urine Legionella antigen is pending. Urine pneumococcus antigen is negative. UA negative. Follow.  #5 bipolar disorder Continue home regimen of Depakote, Lamictal, saphris  #6 hypertension Stable. Continue lisinopril and Lopressor.  #7 hyperlipidemia Fasting lipid panel with total cholesterol 146, LDL 83. Continue statin.  #8 well controlled type 2 diabetes Hemoglobin A1c 6.9. CBGs have ranged from 106-130. Continue to hold metformin. Sliding scale insulin.  #9 prophylaxis PPI for GI prophylaxis. Lovenox for DVT prophylaxis.   Code Status: Full Family Communication: Updated patient no family at bedside. Disposition Plan: Home when medically stable.   Consultants:  Cardiology: Dr. Graciela Husbands 03/31/2013  PCCM. Dr. Craige Cotta 04/01/13  Procedures:  Chest x-ray 04/01/2039  CT of the chest 03/31/2013  2-D echo 04/01/13  Antibiotics:  IV vancomycin 03/31/13/  IV cefepime 03/31/2013  IV Levaquin 4/14/ 2014  HPI/Subjective: Patient with cough. Patient with fever overnight.   Objective: Filed Vitals:   04/02/13 0200 04/02/13 0400 04/02/13 0500 04/02/13 0748  BP:  123/51    Pulse: 94 87 82   Temp: 100 F (37.8 C) 100.4 F (38 C)    TempSrc: Oral Oral    Resp: 25 23 26    Height:      Weight:  83.5 kg (184 lb 1.4 oz)    SpO2: 99% 98% 97% 98%    Intake/Output Summary (Last 24 hours) at 04/02/13 0757 Last data filed at 04/02/13 0600  Gross per 24 hour  Intake   1355 ml  Output    650 ml  Net    705 ml   Filed Weights   03/31/13 1713 04/02/13 0400  Weight: 78.104 kg (172 lb 3 oz) 83.5 kg (184 lb 1.4 oz)    Exam:   General:  NAD  Cardiovascular: RRR  Respiratory: Decreased breath sounds  in the bases.Some coarse BS in left base.  Abdomen: Soft, nontender, nondistended, positive bowel sounds.   Extremities: No clubbing no cyanosis trace  bilateral  lower extremity edema.  Musculoskeletal: 5/5 BUE strength, 5/5 BLE strength   Data Reviewed: Basic Metabolic Panel:  Recent Labs Lab 03/31/13 1200 03/31/13 1914 04/01/13 0645 04/02/13 0338  NA 140  --  137 136  K 4.6  --  5.1 4.8  CL 103  --  101 98  CO2  --   --  30 35*  GLUCOSE 153*  --  159* 182*  BUN 26*  --  26* 20  CREATININE 1.00  --  0.75 0.84  CALCIUM  --   --  8.6 8.2*  MG  --  2.2  --   --    Liver Function Tests: No results found for this basename: AST, ALT, ALKPHOS, BILITOT, PROT, ALBUMIN,  in the last 168 hours No results found for this basename: LIPASE, AMYLASE,  in the last 168 hours No results found for this basename: AMMONIA,  in the last 168 hours CBC:  Recent Labs Lab 03/31/13 1150 03/31/13 1200 04/01/13 0645 04/02/13 0338  WBC 19.2*  --  19.3* 16.2*  NEUTROABS 14.8*  --  15.1* 11.7*  HGB 9.5* 12.6 8.6* 8.1*  HCT 35.0* 37.0 32.3* 30.8*  MCV 74.9*  --  76.5* 77.2*  PLT 595*  --  540* 428*   Cardiac Enzymes:  Recent Labs Lab 03/31/13 1731 04/01/13 0020 04/01/13 0645  TROPONINI 0.31* <0.30 <0.30   BNP (last 3 results)  Recent Labs  04/01/13 0645  PROBNP 928.0*   CBG:  Recent Labs Lab 03/31/13 2218 04/01/13 0726 04/01/13 1158 04/01/13 1611 04/01/13 2131  GLUCAP 149* 135* 124* 106* 130*    Recent Results (from the past 240 hour(s))  MRSA PCR SCREENING     Status: None   Collection Time    03/31/13  6:08 PM      Result Value Range Status   MRSA by PCR NEGATIVE  NEGATIVE Final   Comment:            The GeneXpert MRSA Assay (FDA     approved for NASAL specimens     only), is one component of a     comprehensive MRSA colonization     surveillance program. It is not     intended to diagnose MRSA     infection nor to guide or     monitor treatment for     MRSA infections.  CULTURE, EXPECTORATED SPUTUM-ASSESSMENT     Status: None   Collection Time    04/01/13  7:36 PM      Result Value Range Status   Specimen  Description SPUTUM   Final   Special Requests NONE   Final   Sputum evaluation     Final   Value: THIS SPECIMEN IS ACCEPTABLE. RESPIRATORY CULTURE REPORT TO FOLLOW.   Report Status 04/01/2013 FINAL   Final     Studies: Dg Chest 2 View  03/31/2013  *RADIOLOGY REPORT*  Clinical Data: Cough and shortness of breath  CHEST - 2 VIEW  Comparison: 06/26/2006  Findings: The cardiac shadow is mildly enlarged.  Mild cephalization of the pulmonary vascularity is noted.  No focal infiltrate or sizable effusion is seen.  Findings consistent with a prior shunt are noted.  IMPRESSION: Mild cephalization of the pulmonary vasculature.  No other focal abnormality is seen.   Original Report Authenticated By: Alcide Clever, M.D.  Ct Angio Chest Pe W/cm &/or Wo Cm  03/31/2013  *RADIOLOGY REPORT*  Clinical Data: Cough, shortness of breath, recent pneumonia, pulmonary embolism, past history hypertension, diabetes  CT ANGIOGRAPHY CHEST  Technique:  Multidetector CT imaging of the chest using the standard protocol during bolus administration of intravenous contrast. Multiplanar reconstructed images including MIPs were obtained and reviewed to evaluate the vascular anatomy.  Contrast: OMNIPAQUE IOHEXOL 350 MG/ML SOLN  Comparison: None  Findings: Aorta normal caliber without aneurysm or dissection. Dilated main pulmonary artery 3.4 x 3.5 cm diameter. Pulmonary arteries patent. No evidence of pulmonary embolism. No thoracic adenopathy. Enlargement of lateral segment left lobe liver. Remaining visualized portions of upper abdomen unremarkable. Scattered patchy infiltrates bilateral upper and lower lobes. No pleural or pericardial effusions. No acute osseous findings.  IMPRESSION: No evidence of pulmonary embolism. Scattered patchy bilateral pulmonary infiltrates. Dilated main pulmonary artery, which can be seen with pulmonary arterial hypertension.   Original Report Authenticated By: Ulyses Southward, M.D.    Dg Chest Port 1  View  04/01/2013  *RADIOLOGY REPORT*  Clinical Data: Shortness of breath and weakness.  Follow-up pneumonia.  PORTABLE CHEST - 1 VIEW  Comparison: Yesterday's chest CT and radiographs.  Findings: The cardiac silhouette remains mildly enlarged.  No significant change in mild ill-defined opacity in both lungs.  No visible pleural fluid.  The right ventriculoperitoneal shunt tubing is poorly visualized inferiorly.  On the chest CT, the inferior portion of the tubing terminated in the anterior epicardial/upper abdominal fat.  Unremarkable bones.  IMPRESSION:  1.  Stable mild cardiomegaly and mild bilateral pneumonia. 2.  Ventriculoperitoneal shunt tubing terminating in the anterior epicardial/upper abdominal fat.   Original Report Authenticated By: Beckie Salts, M.D.     Scheduled Meds: . albuterol  2.5 mg Nebulization TID  . antiseptic oral rinse  15 mL Mouth Rinse BID  . asenapine  10 mg Sublingual QHS  . aspirin EC  81 mg Oral Daily  . ceFEPime (MAXIPIME) IV  1 g Intravenous Q8H  . divalproex  500 mg Oral Daily  . enoxaparin (LOVENOX) injection  40 mg Subcutaneous Q24H  . guaiFENesin  1,200 mg Oral BID  . insulin aspart  0-15 Units Subcutaneous TID WC  . lamoTRIgine  100 mg Oral Daily  . levofloxacin  750 mg Intravenous Q24H  . lisinopril  20 mg Oral Daily  . metoprolol tartrate  12.5 mg Oral BID  . multivitamin with minerals  1 tablet Oral Daily  . pantoprazole  40 mg Oral Daily  . simvastatin  40 mg Oral q1800  . vancomycin  1,000 mg Intravenous Q12H  . vitamin B-12  1,000 mcg Oral Daily   Continuous Infusions:   Principal Problem:   Acute respiratory failure with hypoxia Active Problems:   Bipolar I disorder, most recent episode (or current) manic   HCAP (healthcare-associated pneumonia)   Abnormal EKG   Leukocytosis   HTN (hypertension)   Hyperlipidemia   Diabetes   Vitamin D deficiency   Anemia   Elevated troponin    Time spent: > 35 MINS    [facility]  [facility] Pager [phone]. If 7PM-7AM, please contact night-coverage at [email or web address], password [email or web address] 04/02/2013, 7:57 AM  LOS: 2 days

## 2013-04-03 ENCOUNTER — Inpatient Hospital Stay (HOSPITAL_COMMUNITY): Payer: 59

## 2013-04-03 DIAGNOSIS — F3110 Bipolar disorder, current episode manic without psychotic features, unspecified: Secondary | ICD-10-CM

## 2013-04-03 DIAGNOSIS — I5033 Acute on chronic diastolic (congestive) heart failure: Secondary | ICD-10-CM

## 2013-04-03 DIAGNOSIS — I509 Heart failure, unspecified: Secondary | ICD-10-CM

## 2013-04-03 LAB — BASIC METABOLIC PANEL
BUN: 15 mg/dL (ref 6–23)
Creatinine, Ser: 0.8 mg/dL (ref 0.50–1.10)
GFR calc Af Amer: >90 mL/min (ref >90)
GFR calc non Af Amer: 89 mL/min — ABNORMAL LOW (ref >90)
Potassium: 4.2 mEq/L (ref 3.5–5.1)

## 2013-04-03 LAB — GLUCOSE, CAPILLARY
Glucose-Capillary: 168 mg/dL — ABNORMAL HIGH (ref 70–99)
Glucose-Capillary: 123 mg/dL — ABNORMAL HIGH (ref 70–99)
Glucose-Capillary: 107 mg/dL — ABNORMAL HIGH (ref 70–99)
Glucose-Capillary: 127 mg/dL — ABNORMAL HIGH (ref 70–99)

## 2013-04-03 LAB — PRO B NATRIURETIC PEPTIDE: Pro B Natriuretic peptide (BNP): 234.6 pg/mL — ABNORMAL HIGH (ref 0–125)

## 2013-04-03 LAB — IRON AND TIBC: Iron: 28 ug/dL — ABNORMAL LOW (ref 42–135)

## 2013-04-03 LAB — CBC
Hemoglobin: 8.2 g/dL — ABNORMAL LOW (ref 12.0–15.0)
MCHC: 25.5 g/dL — ABNORMAL LOW (ref 30.0–36.0)
RDW: 19.5 % — ABNORMAL HIGH (ref 11.5–15.5)
WBC: 16.7 10*3/uL — ABNORMAL HIGH (ref 4.0–10.5)

## 2013-04-03 NOTE — Progress Notes (Signed)
Occupational Therapy Treatment Patient Details Name: [name] MRN: [id number] DOB: 10/07/1971 Today's Date: 04/03/2013 Time: 4696-2952 OT Time Calculation (min): 20 min  OT Assessment / Plan / Recommendation Comments on Treatment Session Pt tolerated up to chair today but was sleepy during session.    Follow Up Recommendations  No OT follow up;Supervision - Intermittent    Barriers to Discharge       Equipment Recommendations  None recommended by OT (none likely)    Recommendations for Other Services    Frequency Min 2X/week   Plan Discharge plan remains appropriate    Precautions / Restrictions          ADL  Toilet Transfer: Simulated;Supervision/safety Toilet Transfer Method: Stand pivot ADL Comments: Pt sleepy when OT arrived. Woke up and declined need to go to the bathroom but did want to sit up in chair. No unsteadiness with bed to chair noted and no device used. Pt's O2 sats on 2L were 100% but when O2 removed, sats decreased to 87% supine in bed on RA. Replaced O2 and sats back up quickly.     OT Diagnosis:    OT Problem List:   OT Treatment Interventions:     OT Goals ADL Goals ADL Goal: Toilet Transfer - Progress: Progressing toward goals  Visit Information  Last OT Received On: 04/03/13 Assistance Needed: +1    Subjective Data  Subjective: I am sleepy Patient Stated Goal: wants to get up to chair   Prior Functioning       Cognition  Cognition Arousal/Alertness:  (pt sleepy)    Mobility  Bed Mobility Bed Mobility: Supine to Sit Supine to Sit: 6: Modified independent (Device/Increase time);HOB elevated;With rails Transfers Transfers: Sit to Stand;Stand to Sit Sit to Stand: 5: Supervision;With upper extremity assist;From bed Stand to Sit: 5: Supervision;To chair/3-in-1    Exercises      Balance     End of Session OT - End of Session Activity Tolerance: Patient tolerated treatment well Patient left: in chair;with call bell/phone within  reach;with chair alarm set  GO     Lennox Laity [phone] 04/03/2013, 1:09 PM

## 2013-04-03 NOTE — Progress Notes (Addendum)
PULMONARY  / CRITICAL CARE MEDICINE  Name: Crisol Muecke MRN: [id number] DOB: [birth year]    ADMISSION DATE:  03/31/2013 CONSULTATION DATE:  04/01/2013   REFERRING MD :  Ramiro Harvest  CHIEF COMPLAINT:  Short of breath  BRIEF PATIENT DESCRIPTION:  42 y/o never smoker admitted with productive cough and dyspnea.  Found to have pneumonia.  PCCM consulted to assist with management of respiratory failure.  SIGNIFICANT EVENTS: 4/16 Tm 101.3, changed to PCCM service   STUDIES:  4/14 Echo >>EF 55%, Grade 2 Diastolic Dysfunction, no valvular abnormalities 4/14 CT chest >> dilated PA, no PE, patchy b/l infiltrates 4/17 BNP 234 / ESR 20 / PCT neg  LINES / TUBES: PIV  CULTURES: 4/14 Legionella Ag >> netative 4/14 Pneumococcal Ag >> negative 4/14 HIV >> negative 4/14 Blood >>  4/15 Sputum >> 4/16 UC >> negative  ANTIBIOTICS: 4/14 Cefepime >>4/17 4/14 Levaquin >> 4/14 Vancomycin >>4/17   SUBJECTIVE:  RN reports continued low grade temperature - tmax 100.9.  Pt reports wearing bipap for 1 hour last pm.  Sleepy in mornings >> she works 3rd shift and normally sleeps during the day.  VITAL SIGNS: Temp:  [99.5 F (37.5 C)-100.9 F (38.3 C)] 100.9 F (38.3 C) (04/17 0400) Pulse Rate:  [82-95] 82 (04/17 0400) Resp:  [17-26] 18 (04/17 0400) BP: (115-150)/(43-86) 145/84 mmHg (04/17 0400) SpO2:  [94 %-100 %] 100 % (04/17 0756) Weight:  [178 lb 5.6 oz (80.9 kg)] 178 lb 5.6 oz (80.9 kg) (04/17 0000) 3 liters nasal cannula  INTAKE / OUTPUT: Intake/Output     04/16 0701 - 04/17 0700 04/17 0701 - 04/18 0700   P.O.     I.V. (mL/kg) 190 (2.3)    IV Piggyback 700    Total Intake(mL/kg) 890 (11)    Urine (mL/kg/hr) 2500 (1.3)    Total Output 2500     Net -1610          Urine Occurrence 5 x      PHYSICAL EXAMINATION: General:  No distress Neuro:  Sleepy, easily awakens, normal strength, follows commands HEENT:  PERRLA, no sinus tenderness, MP 4, no LAN Cardiovascular:  s1s2  regular, no murmur Lungs:  resp's even/non-labored, lungs bilaterally diminished  Abdomen:  Soft, non tender, normal bowel sounds Musculoskeletal:  No edema Skin:  No rashes  LABS:  Recent Labs Lab 03/31/13 1705 03/31/13 1731 03/31/13 1914 04/01/13 0020 04/01/13 0645 04/02/13 0338 04/03/13 0330 04/03/13 0331  HGB  --   --   --   --  8.6* 8.1* 8.2*  --   WBC  --   --   --   --  19.3* 16.2* 16.7*  --   PLT  --   --   --   --  540* 428* 464*  --   NA  --   --   --   --  137 136 135  --   K  --   --   --   --  5.1 4.8 4.2  --   CL  --   --   --   --  101 98 92*  --   CO2  --   --   --   --  30 35* 40*  --   GLUCOSE  --   --   --   --  159* 182* 150*  --   BUN  --   --   --   --  26* 20 15  --  CREATININE  --   --   --   --  0.75 0.84 0.80  --   CALCIUM  --   --   --   --  8.6 8.2* 8.7  --   MG  --   --  2.2  --   --   --   --   --   APTT  --   --  27  --   --   --   --   --   INR  --   --  1.11  --   --   --   --   --   TROPONINI  --  0.31*  --  <0.30 <0.30  --   --   --   PROCALCITON  --   --   --   --   --  <0.10  --   --   PROBNP  --   --   --   --  928.0* 378.7*  --  234.6*  PHART 7.322*  --   --   --   --   --   --   --   PCO2ART 59.0*  --   --   --   --   --   --   --   PO2ART 85.4  --   --   --   --   --   --   --     Recent Labs Lab 04/01/13 2131 04/02/13 0749 04/02/13 1138 04/02/13 1644 04/02/13 2211  GLUCAP 130* 154* 138* 159* 121*    Imaging: Dg Chest Port 1 View  04/03/2013  *RADIOLOGY REPORT*  Clinical Data: Shortness of breath.  Follow up pneumonia.  PORTABLE CHEST - 1 VIEW  Comparison: 04/02/2013 and 04/01/2013.  Findings: 0455 hours.  The heart size and mediastinal contours are stable.  There has been interval mild improvement in the diffuse ground-glass opacities bilaterally.  No consolidation or significant pleural effusion is identified.  Ventricular peritoneal shunt overlies the right chest.  IMPRESSION: Interval slight improvement in diffuse  bilateral ground-glass opacities.   Original Report Authenticated By: Carey Bullocks, M.D.    Dg Chest Port 1 View  04/02/2013  *RADIOLOGY REPORT*  Clinical Data: Pneumonia.  PORTABLE CHEST - 1 VIEW  Comparison: 04/01/2013.  Findings: Trachea is midline.  Heart size stable.  A ventriculoperitoneal shunt catheter projects over the right chest. There is mild diffuse bilateral air space disease, possibly slightly worsened from 04/01/2013.  No definite pleural fluid.  IMPRESSION: Mild diffuse bilateral air space disease may be due to edema or pneumonia, with suspected slight interval worsening from 04/01/2013.   Original Report Authenticated By: Leanna Battles, M.D.    Dg Chest Port 1 View  04/01/2013  *RADIOLOGY REPORT*  Clinical Data: Shortness of breath and weakness.  Follow-up pneumonia.  PORTABLE CHEST - 1 VIEW  Comparison: Yesterday's chest CT and radiographs.  Findings: The cardiac silhouette remains mildly enlarged.  No significant change in mild ill-defined opacity in both lungs.  No visible pleural fluid.  The right ventriculoperitoneal shunt tubing is poorly visualized inferiorly.  On the chest CT, the inferior portion of the tubing terminated in the anterior epicardial/upper abdominal fat.  Unremarkable bones.  IMPRESSION:  1.  Stable mild cardiomegaly and mild bilateral pneumonia. 2.  Ventriculoperitoneal shunt tubing terminating in the anterior epicardial/upper abdominal fat.   Original Report Authenticated By: Beckie Salts, M.D.     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    P:   -supplemental oxygen to keep SpO2 > 92% -auto BiPAP qhs -f/u CXR 4/18 -continue albuterol -IS, flutter valve -will need outpt evaluation with PFT and sleep study     CARDIOVASCULAR A:  Hypertension with acute on chronic diastolic dysfx. Hx of hyperlipidemia. Enlarged  PA on CT chest with possible pulmonary hypertension >> normal RV function on Echo.  P:  -continue asa, lisinopril, metoprolol, zocor -goal even fluid balance >> hold lasix 4/17  RENAL A:  No acute issues. P:   -monitor renal fx, urine outpt, electrolytes with diuresis   GASTROINTESTINAL A:   Nutrition.  P:   -carb modified diet  HEMATOLOGIC A:   Anemia.  P:  -f/u CBC -continue B-12 -iron panel pending>>  INFECTIOUS A:   Community aquired pneumonia with failed outpt Abx.  P:   -narrow abx 4/17, plan for 10 days total abx -check PCT  ENDOCRINE A:   DM type II.  P:   -SSI  NEUROLOGIC A:   Hx of Bipolar. Hx of hydrocephalus as infant s/p VP shunt.  P:   -continue depakote, lamictal, saphris.   Transfer out of ICU.  Continue Levaquin, oxygen wean, and BiPAP.   Canary Brim, NP-C [facility] Pgr: [phone] or [phone]   Reviewed above, examined pt, and agree with assessment/plan.  Fever curve better, and CXR shows some improvement.  Will narrow Abx.  Try to use BiPAP qhs as tolerated.  Will transfer to floor 4/17 >> keep on PCCM service.    Coralyn Helling, MD [facility] Pulmonary/Critical Care 04/03/2013, 9:42 AM Pager:  [phone] After 3pm call: [phone]

## 2013-04-03 NOTE — Progress Notes (Signed)
CRITICAL VALUE ALERT  Critical value received:  CO2-40  Date of notification:  04/03/13  Time of notification:  0426  Critical value read back: yes  Nurse who received alert:  Barkley Bruns RN CCRN  MD notified (1st page):  Dr. Darrick Penna  Time of first page:  [phone]  MD notified (2nd page):N/A  Time of second page:N/A  Responding MD:  Dr. Vickie Epley  Time MD responded:  [phone]

## 2013-04-04 ENCOUNTER — Inpatient Hospital Stay (HOSPITAL_COMMUNITY): Payer: 59

## 2013-04-04 DIAGNOSIS — J189 Pneumonia, unspecified organism: Secondary | ICD-10-CM

## 2013-04-04 LAB — BASIC METABOLIC PANEL
BUN: 12 mg/dL (ref 6–23)
Calcium: 9 mg/dL (ref 8.4–10.5)
Creatinine, Ser: 0.62 mg/dL (ref 0.50–1.10)
GFR calc Af Amer: >90 mL/min (ref >90)
GFR calc non Af Amer: >90 mL/min (ref >90)

## 2013-04-04 LAB — CBC
HCT: 32.4 % — ABNORMAL LOW (ref 36.0–46.0)
MCHC: 25.6 g/dL — ABNORMAL LOW (ref 30.0–36.0)
RDW: 19.3 % — ABNORMAL HIGH (ref 11.5–15.5)

## 2013-04-04 LAB — CULTURE, RESPIRATORY W GRAM STAIN

## 2013-04-04 MED ORDER — METFORMIN HCL 500 MG PO TABS
1000.0000 mg | ORAL_TABLET | Freq: Every day | ORAL | Status: DC
  Administered 2013-04-05 – 2013-04-08 (×4): 1000 mg via ORAL
  Filled 2013-04-04 (×7): qty 2

## 2013-04-04 MED ORDER — LEVOFLOXACIN 750 MG PO TABS
750.0000 mg | ORAL_TABLET | Freq: Every day | ORAL | Status: DC
  Administered 2013-04-05 – 2013-04-08 (×4): 750 mg via ORAL
  Filled 2013-04-04 (×5): qty 1

## 2013-04-04 NOTE — Progress Notes (Signed)
CRITICAL VALUE ALERT  Critical value received:  CO2 41  Date of notification:  04/04/2013    Time of notification:  6:06 AM   Critical value read back: Yes  Nurse who received alert:  William Dalton, RN  MD notified (1st page):  Hospitalist night coverage [place] Time of first page:  [phone]

## 2013-04-04 NOTE — Progress Notes (Signed)
PULMONARY  / CRITICAL CARE MEDICINE  Name: [name] MRN: [id number] DOB: [birth year]    ADMISSION DATE:  03/31/2013 CONSULTATION DATE:  04/01/2013   REFERRING MD :  Ramiro Harvest  CHIEF COMPLAINT:  Short of breath  BRIEF PATIENT DESCRIPTION:  42 y/o never smoker admitted with productive cough and dyspnea.  Found to have pneumonia.  PCCM consulted to assist with management of respiratory failure.  SIGNIFICANT EVENTS: 4/16 Tm 101.3, changed to PCCM service   STUDIES:  4/14 Echo >>EF 55%, Grade 2 Diastolic Dysfunction, no valvular abnormalities 4/14 CT chest >> dilated PA, no PE, patchy b/l infiltrates 4/17 BNP 234 / ESR 20 / PCT neg  LINES / TUBES: PIV  CULTURES: 4/14 Legionella Ag >> netative 4/14 Pneumococcal Ag >> negative 4/14 HIV >> negative 4/14 Blood >>  4/15 Sputum >> 4/16 UC >> negative  ANTIBIOTICS: 4/14 Cefepime >>4/17 4/14 Levaquin >> 4/14 Vancomycin >>4/17   SUBJECTIVE:  Lethargic. No distress. No new complaints  VITAL SIGNS: Temp:  [98.3 F (36.8 C)-98.5 F (36.9 C)] 98.3 F (36.8 C) (04/18 1359) Pulse Rate:  [78-97] 80 (04/18 1359) Resp:  [16-18] 18 (04/18 1359) BP: (109-129)/(69-82) 119/78 mmHg (04/18 1359) SpO2:  [97 %-99 %] 99 % (04/18 1359)   INTAKE / OUTPUT: Intake/Output     04/17 0701 - 04/18 0700 04/18 0701 - 04/19 0700   P.O. 480    I.V. (mL/kg) 30 (0.4)    IV Piggyback     Total Intake(mL/kg) 510 (6.3)    Urine (mL/kg/hr) 200 (0.1)    Total Output 200     Net +310          Urine Occurrence 4 x 7 x   Stool Occurrence  1 x     PHYSICAL EXAMINATION: General:  No distress Neuro:  Sleepy, easily awakens, normal strength, follows commands HEENT:  PERRLA, no sinus tenderness, MP 4, no LAN Cardiovascular:  s1s2 regular, no murmur Lungs:  resp's even/non-labored, lungs bilaterally diminished  Abdomen:  Soft, non tender, normal bowel sounds Musculoskeletal:  No edema Skin:  No rashes  LABS:  Recent Labs Lab  03/31/13 1705 03/31/13 1731 03/31/13 1914 04/01/13 0020  04/01/13 0645 04/02/13 0338 04/03/13 0330 04/03/13 0331 04/04/13 0445  HGB  --   --   --   --   --  8.6* 8.1* 8.2*  --  8.3*  WBC  --   --   --   --   --  19.3* 16.2* 16.7*  --  16.3*  PLT  --   --   --   --   --  540* 428* 464*  --  404*  NA  --   --   --   --   --  137 136 135  --  136  K  --   --   --   --   --  5.1 4.8 4.2  --  4.0  CL  --   --   --   --   --  101 98 92*  --  92*  CO2  --   --   --   --   < > 30 35* 40*  --  41*  GLUCOSE  --   --   --   --   --  159* 182* 150*  --  127*  BUN  --   --   --   --   --  26* 20 15  --  12  CREATININE  --   --   --   --   --  0.75 0.84 0.80  --  0.62  CALCIUM  --   --   --   --   < > 8.6 8.2* 8.7  --  9.0  MG  --   --  2.2  --   --   --   --   --   --   --   APTT  --   --  27  --   --   --   --   --   --   --   INR  --   --  1.11  --   --   --   --   --   --   --   TROPONINI  --  0.31*  --  <0.30  --  <0.30  --   --   --   --   PROCALCITON  --   --   --   --   --   --  <0.10  --   --   --   PROBNP  --   --   --   --   --  928.0* 378.7*  --  234.6*  --   PHART 7.322*  --   --   --   --   --   --   --   --   --   PCO2ART 59.0*  --   --   --   --   --   --   --   --   --   PO2ART 85.4  --   --   --   --   --   --   --   --   --   < > = values in this interval not displayed.  Recent Labs Lab 04/03/13 1251 04/03/13 1614 04/03/13 2121 04/04/13 0713 04/04/13 1117  GLUCAP 123* 107* 127* 126* 168*    Imaging: Dg Chest 2 View  04/04/2013  *RADIOLOGY REPORT*  Clinical Data: Bilateral air space disease with shortness of breath, cough and wheezing.  CHEST - 2 VIEW  Comparison: 04/03/2013 and CT chest 03/31/2013.  Findings: Trachea is midline.  Heart size stable. Ventriculoperitoneal shunt catheter projects over the right chest. Mild diffuse interstitial prominence and indistinctness with thickening of the minor fissure.  Overall appearance is likely slightly improved.  IMPRESSION:  Improving diffuse bilateral air space disease, which may be due to improving edema.   Original Report Authenticated By: Leanna Battles, M.D.    Dg Chest Port 1 View  04/03/2013  *RADIOLOGY REPORT*  Clinical Data: Shortness of breath.  Follow up pneumonia.  PORTABLE CHEST - 1 VIEW  Comparison: 04/02/2013 and 04/01/2013.  Findings: 0455 hours.  The heart size and mediastinal contours are stable.  There has been interval mild improvement in the diffuse ground-glass opacities bilaterally.  No consolidation or significant pleural effusion is identified.  Ventricular peritoneal shunt overlies the right chest.  IMPRESSION: Interval slight improvement in diffuse bilateral ground-glass opacities.   Original Report Authenticated By: Carey Bullocks, M.D.     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    P:   -supplemental oxygen to keep SpO2 > 92% -auto BiPAP qhs -continue albuterol -IS, flutter valve -will need outpt evaluation with PFT and sleep study  CARDIOVASCULAR A:  Hypertension with acute on chronic diastolic dysfx. Hx of hyperlipidemia. Enlarged PA on CT chest with possible pulmonary hypertension >> normal RV function on Echo.  P:  -continue asa, lisinopril, metoprolol, zocor -goal even fluid balance >> hold lasix 4/17  RENAL A:  No acute issues. P:   -monitor renal fx, urine outpt, electrolytes with diuresis   GASTROINTESTINAL A:   Nutrition.  P:   -carb modified diet  HEMATOLOGIC A:   Anemia.  P:  -f/u CBC -continue B-12 -iron panel pending>>  INFECTIOUS A:   Community aquired pneumonia with failed outpt Abx.  P:   -narrow abx 4/17, plan for 10 days total abx -change levoflox to PO 4/18  ENDOCRINE A:   DM type II.  P:   -Cont SSI  NEUROLOGIC A:   Hx of Bipolar. Hx of hydrocephalus as infant s/p VP shunt.  P:    -continue depakote, lamictal, saphris.   Billy Fischer, MD ; [facility] [phone].  After 5:30 PM or weekends, call [phone]

## 2013-04-04 NOTE — Progress Notes (Signed)
Spoke with pt, refuses to wear nighttime BIPAP at this time, remains on [place]. RT will assist as needed.

## 2013-04-04 NOTE — Progress Notes (Signed)
Education provided for nocturnal CPAP/BiPAP. After several nights of noncompliance, patient now agrees to try again. Placed on BiPAP auto titration Imax 20 Emin 4 with humidification via medium full face mask. Patient states she used a full face mask about 6 years ago when she used a machine at home, but has since not had a machine to use. Tolerating well at this time. VSS with 2L supplemental O2 bleed in proximal to the patient. Humidifier filled to full line with sterile water. RN aware of patient compliance at this time.

## 2013-04-04 NOTE — Progress Notes (Signed)
Physical Therapy Treatment Patient Details Name: [name] MRN: [id number] DOB: May 21, 1971 Today's Date: 04/04/2013 Time: 4098-1191 PT Time Calculation (min): 25 min  PT Assessment / Plan / Recommendation Comments on Treatment Session  Able to increased ambulation distance. O2 sats 94% on RA during ambulation. Pt appeared to tolerate well.     Follow Up Recommendations  Home health PT     Does the patient have the potential to tolerate intense rehabilitation     Barriers to Discharge        Equipment Recommendations  None recommended by PT    Recommendations for Other Services    Frequency Min 3X/week   Plan Discharge plan remains appropriate    Precautions / Restrictions Precautions Precautions: Fall Restrictions Weight Bearing Restrictions: No   Pertinent Vitals/Pain No c/o pain    Mobility  Bed Mobility Bed Mobility: Not assessed Details for Bed Mobility Assistance: pt sitting in recliner Transfers Transfers: Sit to Stand;Stand to Sit Sit to Stand: 5: Supervision;From chair/3-in-1 Stand to Sit: 5: Supervision;To chair/3-in-1 Ambulation/Gait Ambulation/Gait Assistance: 4: Min assist Ambulation Distance (Feet): 175 Feet Assistive device: None Ambulation/Gait Assistance Details: Increased lateral sway. Slightly unsteady today due to drowsiness.  Gait Pattern: Wide base of support;Step-through pattern    Exercises     PT Diagnosis:    PT Problem List:   PT Treatment Interventions:     PT Goals Acute Rehab PT Goals Pt will go Sit to Stand: Independently PT Goal: Sit to Stand - Progress: Progressing toward goal Pt will go Stand to Sit: Independently PT Goal: Stand to Sit - Progress: Progressing toward goal Pt will Ambulate: >150 feet;with least restrictive assistive device;with modified independence PT Goal: Ambulate - Progress: Progressing toward goal  Visit Information  Last PT Received On: 04/04/13 Assistance Needed: +1    Subjective Data  Subjective: Im sleepy Patient Stated Goal: to go home   Cognition  Cognition Arousal/Alertness: Lethargic Behavior During Therapy: WFL for tasks assessed/performed    Balance     End of Session PT - End of Session Equipment Utilized During Treatment: Gait belt Activity Tolerance: Patient tolerated treatment well Patient left: in chair;with call bell/phone within reach   GP     Rebeca Alert, PT Pager: [phone]

## 2013-04-05 DIAGNOSIS — R0609 Other forms of dyspnea: Secondary | ICD-10-CM

## 2013-04-05 DIAGNOSIS — R0989 Other specified symptoms and signs involving the circulatory and respiratory systems: Secondary | ICD-10-CM

## 2013-04-05 LAB — GLUCOSE, CAPILLARY
Glucose-Capillary: 90 mg/dL (ref 70–99)
Glucose-Capillary: 120 mg/dL — ABNORMAL HIGH (ref 70–99)
Glucose-Capillary: 121 mg/dL — ABNORMAL HIGH (ref 70–99)
Glucose-Capillary: 176 mg/dL — ABNORMAL HIGH (ref 70–99)

## 2013-04-05 MED ORDER — ACETAZOLAMIDE 250 MG PO TABS
500.0000 mg | ORAL_TABLET | Freq: Once | ORAL | Status: AC
  Administered 2013-04-05: 500 mg via ORAL
  Filled 2013-04-05: qty 2

## 2013-04-05 MED ORDER — FERROUS SULFATE 325 (65 FE) MG PO TABS
325.0000 mg | ORAL_TABLET | Freq: Two times a day (BID) | ORAL | Status: DC
  Administered 2013-04-05 – 2013-04-08 (×7): 325 mg via ORAL
  Filled 2013-04-05 (×9): qty 1

## 2013-04-05 NOTE — Progress Notes (Signed)
PULMONARY  / CRITICAL CARE MEDICINE  Name: [name] MRN: [id number] DOB: 08/30/1971    ADMISSION DATE:  03/31/2013 CONSULTATION DATE:  04/01/2013   REFERRING MD :  Ramiro Harvest  CHIEF COMPLAINT:  Short of breath  BRIEF PATIENT DESCRIPTION:  42 y/o never smoker admitted with productive cough and dyspnea.  Found to have pneumonia.  PCCM consulted to assist with management of respiratory failure.  SIGNIFICANT EVENTS: 4/16 Tm 101.3, changed to PCCM service   STUDIES:  4/14 Echo >>EF 55%, Grade 2 Diastolic Dysfunction, no valvular abnormalities 4/14 CT chest >> dilated PA, no PE, patchy b/l infiltrates 4/17 BNP 234 / ESR 20 / PCT neg  LINES / TUBES: PIV  CULTURES: 4/14 Legionella Ag >> negative 4/14 Pneumococcal Ag >> negative 4/14 HIV >> negative 4/14 Blood >>  4/15 Sputum >> 4/16 UC >> negative  ANTIBIOTICS: 4/14 Cefepime >>4/17 4/14 Levaquin >> 4/14 Vancomycin >>4/17   SUBJECTIVE:  Lethargic. No distress. No new complaints  VITAL SIGNS: Temp:  [98.1 F (36.7 C)-98.3 F (36.8 C)] 98.1 F (36.7 C) (04/19 0600) Pulse Rate:  [76-84] 81 (04/19 1040) Resp:  [16-18] 16 (04/19 0600) BP: (114-149)/(77-89) 145/80 mmHg (04/19 1040) SpO2:  [94 %-100 %] 100 % (04/19 1040) Weight:  [81.92 kg (180 lb 9.6 oz)] 81.92 kg (180 lb 9.6 oz) (04/19 1030)   INTAKE / OUTPUT: Intake/Output     04/18 0701 - 04/19 0700 04/19 0701 - 04/20 0700   P.O. 240 120   I.V. (mL/kg)     Total Intake(mL/kg) 240 (3) 120 (1.5)   Urine (mL/kg/hr)     Total Output       Net +240 +120        Urine Occurrence 9 x    Stool Occurrence 1 x      PHYSICAL EXAMINATION: General:  No distress Neuro:  Sleepy, easily awakens, normal strength, follows commands HEENT:  PERRLA, no sinus tenderness, MP 4, no LAN Cardiovascular:  s1s2 regular, no murmur Lungs:  resp's even/non-labored, lungs bilaterally diminished  Abdomen:  Soft, non tender, normal bowel sounds Musculoskeletal:  No edema Skin:   No rashes  LABS:  Recent Labs Lab 03/31/13 1705 03/31/13 1731 03/31/13 1914 04/01/13 0020  04/01/13 0645 04/02/13 0338 04/03/13 0330 04/03/13 0331 04/04/13 0445  HGB  --   --   --   --   --  8.6* 8.1* 8.2*  --  8.3*  WBC  --   --   --   --   --  19.3* 16.2* 16.7*  --  16.3*  PLT  --   --   --   --   --  540* 428* 464*  --  404*  NA  --   --   --   --   --  137 136 135  --  136  K  --   --   --   --   --  5.1 4.8 4.2  --  4.0  CL  --   --   --   --   --  101 98 92*  --  92*  CO2  --   --   --   --   < > 30 35* 40*  --  41*  GLUCOSE  --   --   --   --   --  159* 182* 150*  --  127*  BUN  --   --   --   --   --  26* 20 15  --  12  CREATININE  --   --   --   --   --  0.75 0.84 0.80  --  0.62  CALCIUM  --   --   --   --   < > 8.6 8.2* 8.7  --  9.0  MG  --   --  2.2  --   --   --   --   --   --   --   APTT  --   --  27  --   --   --   --   --   --   --   INR  --   --  1.11  --   --   --   --   --   --   --   TROPONINI  --  0.31*  --  <0.30  --  <0.30  --   --   --   --   PROCALCITON  --   --   --   --   --   --  <0.10  --   --   --   PROBNP  --   --   --   --   --  928.0* 378.7*  --  234.6*  --   PHART 7.322*  --   --   --   --   --   --   --   --   --   PCO2ART 59.0*  --   --   --   --   --   --   --   --   --   PO2ART 85.4  --   --   --   --   --   --   --   --   --   < > = values in this interval not displayed.  Recent Labs Lab 04/04/13 1641 04/04/13 2121 04/05/13 0706 04/05/13 1013 04/05/13 1208  GLUCAP 90 120* 115* 121* 176*    Imaging: Dg Chest 2 View  04/04/2013  *RADIOLOGY REPORT*  Clinical Data: Bilateral air space disease with shortness of breath, cough and wheezing.  CHEST - 2 VIEW  Comparison: 04/03/2013 and CT chest 03/31/2013.  Findings: Trachea is midline.  Heart size stable. Ventriculoperitoneal shunt catheter projects over the right chest. Mild diffuse interstitial prominence and indistinctness with thickening of the minor fissure.  Overall appearance is  likely slightly improved.  IMPRESSION: Improving diffuse bilateral air space disease, which may be due to improving edema.   Original Report Authenticated By: Leanna Battles, M.D.     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   P:   -supplemental oxygen to keep SpO2 > 92% -continue Levaquin750 po -auto BiPAP qhs -continue albuterol NEBS -Bicarb up from 30=>41, will rx w/ diamox -IS, flutter valve -will need outpt evaluation with PFT and sleep study    CARDIOVASCULAR A:  Hypertension with acute on chronic diastolic dysfx. Hx of hyperlipidemia. Enlarged PA on CT chest with possible pulmonary hypertension >> normal RV function on Echo. P:  -continue asa, lisinopril, zocor -goal even fluid balance >> hold lasix 4/17 -daily diamox order 4/19  RENAL A:  No acute issues. P:   -monitor renal fx, urine outpt, electrolytes with diuresis   GASTROINTESTINAL A:   Nutrition. P:   -carb modified  diet   HEMATOLOGIC A:   Anemia- Fe defic P:  -rx w/ oral Fe, but may need IV iron... -continue B-12 orally as well   INFECTIOUS A:   Community aquired pneumonia with failed outpt Abx. P:   -narrow abx 4/17, plan for 10 days total abx -change levoflox to PO 4/18   ENDOCRINE A:   DM type II. P:   -Cont SSI & Metform   NEUROLOGIC A:   Hx of Bipolar. Hx of hydrocephalus as infant s/p VP shunt. P:   -continue depakote, lamictal, saphris.   Lonzo Cloud. Kriste Basque, MD 04/05/13 @ 12:19PM

## 2013-04-06 DIAGNOSIS — G4733 Obstructive sleep apnea (adult) (pediatric): Secondary | ICD-10-CM

## 2013-04-06 LAB — BASIC METABOLIC PANEL
BUN: 9 mg/dL (ref 6–23)
CO2: 29 mEq/L (ref 19–32)
Calcium: 9.5 mg/dL (ref 8.4–10.5)
Creatinine, Ser: 0.63 mg/dL (ref 0.50–1.10)
Glucose, Bld: 113 mg/dL — ABNORMAL HIGH (ref 70–99)

## 2013-04-06 LAB — GLUCOSE, CAPILLARY

## 2013-04-06 NOTE — Progress Notes (Signed)
PULMONARY  / CRITICAL CARE MEDICINE  Name: [name] MRN: [id number] DOB: 1971/05/21    ADMISSION DATE:  03/31/2013 CONSULTATION DATE:  04/01/2013   REFERRING MD :  Ramiro Harvest  CHIEF COMPLAINT:  Short of breath  BRIEF PATIENT DESCRIPTION:  42 y/o never smoker admitted with productive cough and dyspnea.  Found to have pneumonia.  PCCM consulted to assist with management of respiratory failure.  SIGNIFICANT EVENTS: 4/16 Tm 101.3, changed to PCCM service   STUDIES:  4/14 Echo >>EF 55%, Grade 2 Diastolic Dysfunction, no valvular abnormalities 4/14 CT chest >> dilated PA, no PE, patchy b/l infiltrates 4/17 BNP 234 / ESR 20 / PCT neg  LINES / TUBES: PIV  CULTURES: 4/14 Legionella Ag >> negative 4/14 Pneumococcal Ag >> negative 4/14 HIV >> negative 4/14 Blood >> neg 4/15 Sputum >> NTF 4/16 UC >> negative  ANTIBIOTICS: 4/14 Cefepime >>4/17 4/14 Levaquin >> 4/14 Vancomycin >>4/17   SUBJECTIVE:  Lethargic. No distress. No new complaints  VITAL SIGNS: Temp:  [98 F (36.7 C)-98.2 F (36.8 C)] 98.1 F (36.7 C) (04/20 0535) Pulse Rate:  [61-84] 61 (04/20 0535) Resp:  [16-18] 16 (04/20 0535) BP: (133-144)/(79-90) 144/86 mmHg (04/20 0911) SpO2:  [91 %-100 %] 93 % (04/20 1015)   INTAKE / OUTPUT: Intake/Output     04/19 0701 - 04/20 0700 04/20 0701 - 04/21 0700   P.O. 240    Total Intake(mL/kg) 240 (2.9)    Urine (mL/kg/hr)  300 (0.8)   Total Output   300   Net +240 -300        Urine Occurrence 9 x      PHYSICAL EXAMINATION: General:  No distress Neuro:  Sleepy, easily awakens, normal strength, follows commands HEENT:  PERRLA, no sinus tenderness, MP 4, no LAN Cardiovascular:  s1s2 regular, no murmur Lungs:  resp's even/non-labored, lungs bilaterally diminished  Abdomen:  Soft, non tender, normal bowel sounds Musculoskeletal:  No edema Skin:  No rashes  LABS:  Recent Labs Lab 03/31/13 1705 03/31/13 1731 03/31/13 1914 04/01/13 0020   04/01/13 0645 04/02/13 0338 04/03/13 0330 04/03/13 0331 04/04/13 0445 04/06/13 0524  HGB  --   --   --   --   --  8.6* 8.1* 8.2*  --  8.3*  --   WBC  --   --   --   --   --  19.3* 16.2* 16.7*  --  16.3*  --   PLT  --   --   --   --   --  540* 428* 464*  --  404*  --   NA  --   --   --   --   --  137 136 135  --  136 134*  K  --   --   --   --   --  5.1 4.8 4.2  --  4.0 4.4  CL  --   --   --   --   --  101 98 92*  --  92* 98  CO2  --   --   --   --   < > 30 35* 40*  --  41* 29  GLUCOSE  --   --   --   --   --  159* 182* 150*  --  127* 113*  BUN  --   --   --   --   --  26* 20 15  --  12 9  CREATININE  --   --   --   --   --  0.75 0.84 0.80  --  0.62 0.63  CALCIUM  --   --   --   --   < > 8.6 8.2* 8.7  --  9.0 9.5  MG  --   --  2.2  --   --   --   --   --   --   --   --   APTT  --   --  27  --   --   --   --   --   --   --   --   INR  --   --  1.11  --   --   --   --   --   --   --   --   TROPONINI  --  0.31*  --  <0.30  --  <0.30  --   --   --   --   --   PROCALCITON  --   --   --   --   --   --  <0.10  --   --   --   --   PROBNP  --   --   --   --   --  928.0* 378.7*  --  234.6*  --   --   PHART 7.322*  --   --   --   --   --   --   --   --   --   --   PCO2ART 59.0*  --   --   --   --   --   --   --   --   --   --   PO2ART 85.4  --   --   --   --   --   --   --   --   --   --   < > = values in this interval not displayed.  Recent Labs Lab 04/05/13 0706 04/05/13 1013 04/05/13 1208 04/05/13 1636 04/06/13 0737  GLUCAP 115* 121* 176* 90 93    Imaging: No results found.  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   P:   -supplemental oxygen to keep SpO2 > 92% -continue Levaquin750 po -auto BiPAP qhs -continue albuterol NEBS -BMet improved w/ one dose Diamox 4/19 -IS, flutter valve -will need outpt evaluation with PFT and sleep study     CARDIOVASCULAR A:  Hypertension with acute on chronic diastolic dysfx. Hx of hyperlipidemia. Enlarged PA on CT chest with possible pulmonary hypertension >> normal RV function on Echo. P:  -continue asa, lisinopril, zocor -goal even fluid balance >> hold lasix 4/17 -daily diamox order 4/19  RENAL A:  No acute issues. P:   -monitor renal fx, urine outpt, electrolytes with diuresis   GASTROINTESTINAL A:   Nutrition. P:   -carb modified diet   HEMATOLOGIC A:   Anemia- Fe defic P:  -rx w/ oral Fe, but may need IV iron... -continue B-12 orally as well   INFECTIOUS A:   Community aquired pneumonia with failed outpt Abx. P:   -narrow abx 4/17, plan for 10 days total abx -change levoflox to PO 4/18   ENDOCRINE A:   DM type II. P:   -Cont SSI & Metform   NEUROLOGIC A:   Hx of Bipolar. Hx of hydrocephalus as infant s/p VP shunt. P:   -  continue depakote, lamictal, saphris.   Lonzo Cloud. Kriste Basque, MD 04/06/13 @ 11:40AM

## 2013-04-06 NOTE — Progress Notes (Signed)
Patient was unsure if she wanted to wear her BIPAP . Patient says she will try wearing it for a couple of hours, tolerating well at this time !

## 2013-04-07 LAB — GLUCOSE, CAPILLARY
Glucose-Capillary: 99 mg/dL (ref 70–99)
Glucose-Capillary: 104 mg/dL — ABNORMAL HIGH (ref 70–99)
Glucose-Capillary: 96 mg/dL (ref 70–99)
Glucose-Capillary: 97 mg/dL (ref 70–99)

## 2013-04-07 LAB — CULTURE, BLOOD (ROUTINE X 2): Culture: NO GROWTH

## 2013-04-07 MED ORDER — IRBESARTAN 150 MG PO TABS
150.0000 mg | ORAL_TABLET | Freq: Every day | ORAL | Status: DC
  Administered 2013-04-07 – 2013-04-08 (×2): 150 mg via ORAL
  Filled 2013-04-07 (×2): qty 1

## 2013-04-07 NOTE — Progress Notes (Signed)
Physical Therapy Treatment Patient Details Name: [name] MRN: [id number] DOB: Jun 22, 1971 Today's Date: 04/07/2013 Time: 4098-1191 PT Time Calculation (min): 24 min  PT Assessment / Plan / Recommendation Comments on Treatment Session  Able to increased ambulation distance. O2 sats 85 immediately after ambulating, increased to 95% within 15 seconds. on RA during ambulation. Pt appeared to tolerate well. replaced O2    Follow Up Recommendations  Home health PT     Does the patient have the potential to tolerate intense rehabilitation     Barriers to Discharge        Equipment Recommendations  None recommended by PT    Recommendations for Other Services    Frequency Min 3X/week   Plan Discharge plan remains appropriate    Precautions / Restrictions Precautions Precautions: Fall Precaution Comments: monitor sats. Restrictions Weight Bearing Restrictions: No   Pertinent Vitals/Pain sats 85% for ambulating RA, quick return to 95% after rest.    Mobility  Bed Mobility Supine to Sit: 6: Modified independent (Device/Increase time);HOB elevated;With rails Transfers Sit to Stand: 5: Supervision;From bed;From chair/3-in-1;With armrests Stand to Sit: To chair/3-in-1;With armrests;With upper extremity assist;6: Modified independent (Device/Increase time) Ambulation/Gait Ambulation/Gait Assistance: 4: Min guard Ambulation Distance (Feet): 250 Feet Assistive device: None Ambulation/Gait Assistance Details: lateral sway which may be premorbid due to pt. stature. Gait Pattern: Wide base of support;Step-through pattern    Exercises     PT Diagnosis:    PT Problem List:   PT Treatment Interventions:     PT Goals Acute Rehab PT Goals Pt will go Sit to Stand: Independently PT Goal: Sit to Stand - Progress: Progressing toward goal Pt will go Stand to Sit: Independently PT Goal: Stand to Sit - Progress: Progressing toward goal Pt will Ambulate: >150 feet;with least restrictive  assistive device;with modified independence PT Goal: Ambulate - Progress: Progressing toward goal  Visit Information  Last PT Received On: 04/07/13 Assistance Needed: +1    Subjective Data  Subjective: I diod not get a s SOB   Cognition  Cognition Arousal/Alertness: Awake/alert Behavior During Therapy: WFL for tasks assessed/performed    Balance  Balance Balance Assessed: Yes Dynamic Standing Balance Dynamic Standing - Balance Support: No upper extremity supported Dynamic Standing - Level of Assistance: 5: Stand by assistance Dynamic Standing - Comments: able to stand and perform pericare after toileting.  End of Session PT - End of Session Activity Tolerance: Patient tolerated treatment well Patient left: in chair;with call bell/phone within reach;with chair alarm set Nurse Communication: Mobility status   GP     Rada Hay 04/07/2013, 10:04 AM Blanchard Kelch PT [phone]

## 2013-04-07 NOTE — Progress Notes (Signed)
Tried 3 different  times to put pt on Bipap and pt said she needed nursing staff to clean her up. She did not want to put her Bipap on until she was clean. Will continue to monitor so I can place ner on her Bipap.

## 2013-04-07 NOTE — Progress Notes (Signed)
PULMONARY  / CRITICAL CARE MEDICINE  Name: [name] MRN: [id number] DOB: 07/10/1971    ADMISSION DATE:  03/31/2013 CONSULTATION DATE:  04/01/2013   REFERRING MD :  Ramiro Harvest  CHIEF COMPLAINT:  Short of breath  BRIEF PATIENT DESCRIPTION:  42 y/o never smoker admitted with productive cough and dyspnea.  Found to have pneumonia.  PCCM consulted to assist with management of respiratory failure.  SIGNIFICANT EVENTS: 4/16 Tm 101.3, changed to PCCM service  STUDIES:  4/14 Echo >>EF 55%, Grade 2 Diastolic Dysfunction, no valvular abnormalities 4/14 CT chest >> dilated PA, no PE, patchy b/l infiltrates 4/17 BNP 234 / ESR 20 / PCT neg  LINES / TUBES: PIV  CULTURES: 4/14 Legionella Ag >> negative 4/14 Pneumococcal Ag >> negative 4/14 HIV >> negative 4/14 Blood >> neg 4/15 Sputum >> NTF 4/16 UC >> negative  ANTIBIOTICS: 4/14 Cefepime >>4/17 4/14 Levaquin >> 4/14 Vancomycin >>4/17   SUBJECTIVE:   . No distress. No new complaints sitting in recliner  VITAL SIGNS: Temp:  [98.4 F (36.9 C)-99.3 F (37.4 C)] 99.3 F (37.4 C) (04/21 0600) Pulse Rate:  [78-90] 78 (04/21 0600) Resp:  [18-20] 20 (04/21 0600) BP: (118-136)/(75-79) 118/79 mmHg (04/21 0600) SpO2:  [94 %-100 %] 94 % (04/21 0600) 2 liters  INTAKE / OUTPUT: Intake/Output     04/20 0701 - 04/21 0700 04/21 0701 - 04/22 0700   P.O. 360    Total Intake(mL/kg) 360 (4.4)    Urine (mL/kg/hr) 1300 (0.7)    Total Output 1300     Net -940          Urine Occurrence 1 x 2 x     PHYSICAL EXAMINATION: General:  No distress Neuro:  Sleepy, easily awakens, normal strength, follows commands HEENT:  PERRLA, no sinus tenderness, MP 4, no LAN Cardiovascular:  s1s2 regular, no murmur Lungs:  resp's even/non-labored, lungs bilaterally diminished  Abdomen:  Soft, non tender, normal bowel sounds Musculoskeletal:  No edema Skin:  No rashes  LABS:  Recent Labs Lab 04/03/13 0330 04/04/13 0445 04/06/13 0524  NA  135 136 134*  K 4.2 4.0 4.4  CL 92* 92* 98  CO2 40* 41* 29  BUN 15 12 9   CREATININE 0.80 0.62 0.63  GLUCOSE 150* 127* 113*    Recent Labs Lab 04/02/13 0338 04/03/13 0330 04/04/13 0445  HGB 8.1* 8.2* 8.3*  HCT 30.8* 32.2* 32.4*  WBC 16.2* 16.7* 16.3*  PLT 428* 464* 404*     Recent Labs Lab 04/06/13 1124 04/06/13 1646 04/06/13 2153 04/07/13 0730 04/07/13 1119  GLUCAP 122* 102* 99 104* 96   ABG    Component Value Date/Time   PHART 7.322* 03/31/2013 1705   PCO2ART 59.0* 03/31/2013 1705   PO2ART 85.4 03/31/2013 1705   HCO3 29.7* 03/31/2013 1705   TCO2 28.3 03/31/2013 1705   O2SAT 94.3 03/31/2013 1705    Imaging: No results found.  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   >she is almost ready for d/c. Needs new CPAP or BIPAP device for home.  P:   -supplemental oxygen to keep SpO2 > 92% -continue Levaquin 750 po (need to complete 10d total so stop date =  4/24) -continue albuterol NEBS -IS, flutterB valve -will need outpt evaluation with PFT and sleep study -walking oximetry prior to d/c  - try off cpap/bipap and check abg's am 4/22  Hypertension with acute on chronic diastolic dysfx. Hx of hyperlipidemia. P:  -continue asa,zocor > change lisinopril to arb 4/21 in setting of resp failure -goal even fluid balance >> hold lasix 4/17 -daily diamox order 4/19   Anemia- Fe defic P:  -rx w/ oral Fe, but may need IV iron... -continue B-12 orally as well  DM type II. P:   -Cont SSI & Metform   Hx of Bipolar. Hx of hydrocephalus as infant s/p VP shunt. P:   -continue depakote, lamictal, saphris.  Sandrea Hughs, MD Pulmonary and Critical Care Medicine [facility] Cell [phone] After 5:30 PM or weekends, call [phone]

## 2013-04-07 NOTE — Progress Notes (Signed)
Pt ambulated down hall without O2. Part of the time O2 dropped to 89% with lowest 83% for couple of seconds.  Put 1L of O2 on pt via n/c. Within less than 20 seconds O2 went up 95% and above for the remaining of the ambulatory period.

## 2013-04-07 NOTE — Progress Notes (Signed)
Patient place on Bipap. Patient are tolerating well.

## 2013-04-08 LAB — GLUCOSE, CAPILLARY
Glucose-Capillary: 140 mg/dL — ABNORMAL HIGH (ref 70–99)
Glucose-Capillary: 98 mg/dL (ref 70–99)

## 2013-04-08 LAB — BLOOD GAS, ARTERIAL
Acid-Base Excess: 2 mmol/L (ref 0.0–2.0)
Bicarbonate: 28.9 mEq/L — ABNORMAL HIGH (ref 20.0–24.0)
O2 Content: 1 L/min
O2 Saturation: 95.9 %
Patient temperature: 37
pO2, Arterial: 89.8 mmHg (ref 80.0–100.0)

## 2013-04-08 MED ORDER — FERROUS SULFATE 325 (65 FE) MG PO TABS: 325.0000 mg | ORAL_TABLET | Freq: Two times a day (BID) | ORAL | Status: DC

## 2013-04-08 MED ORDER — POTASSIUM CHLORIDE ER 10 MEQ PO TBCR: 20.0000 meq | EXTENDED_RELEASE_TABLET | Freq: Every day | ORAL | Status: DC | PRN

## 2013-04-08 MED ORDER — POLYETHYLENE GLYCOL 3350 17 G PO PACK
17.0000 g | PACK | Freq: Every day | ORAL | Status: DC
  Administered 2013-04-08: 17 g via ORAL
  Filled 2013-04-08: qty 1

## 2013-04-08 MED ORDER — LEVOFLOXACIN 750 MG PO TABS: 750.0000 mg | ORAL_TABLET | Freq: Every day | ORAL | Status: DC

## 2013-04-08 MED ORDER — IRBESARTAN 150 MG PO TABS: 150.0000 mg | ORAL_TABLET | Freq: Every day | ORAL | Status: DC

## 2013-04-08 MED ORDER — FUROSEMIDE 40 MG PO TABS
40.0000 mg | ORAL_TABLET | Freq: Every day | ORAL | Status: DC | PRN
Start: 2013-04-08 — End: 2016-09-11

## 2013-04-08 MED ORDER — DOCUSATE SODIUM 100 MG PO CAPS
100.0000 mg | ORAL_CAPSULE | Freq: Every day | ORAL | Status: DC
  Administered 2013-04-08: 100 mg via ORAL
  Filled 2013-04-08: qty 1

## 2013-04-08 NOTE — Progress Notes (Signed)
Occupational Therapy Treatment Patient Details Name: [name] MRN: [id number] DOB: 03-08-1971 Today's Date: 04/08/2013 Time: 6962-9528 OT Time Calculation (min): 24 min  OT Assessment / Plan / Recommendation Comments on Treatment Session Pt tolerated bathing and dressing in bathroom with supervision to perform. Needs to gather all items next session. O2 sats after task 97% on RA. Per nursing ok to leave O2 off after returning to chair.     Follow Up Recommendations  No OT follow up;Supervision - Intermittent    Barriers to Discharge       Equipment Recommendations  None recommended by OT    Recommendations for Other Services    Frequency Min 2X/week   Plan Discharge plan remains appropriate    Precautions / Restrictions Precautions Precautions: Fall Precaution Comments: monitor sats. Restrictions Weight Bearing Restrictions: No        ADL  Toilet Transfer: Dentist: Comfort height toilet (no grab bar use. no device to transfer into bathroom') Transfers/Ambulation Related to ADLs: Did not gather own items for bath/dress this visit ADL Comments: Discussed energy conservation strategies including breaks PRN, breaking down tasks, use of tub seat, etc. Pt states her sister probably  has a tubseat she can use. Pt performed bath at sink level in bathroom with supervision both sit and stand level. No unsteadiness noted.     OT Diagnosis:    OT Problem List:   OT Treatment Interventions:     OT Goals ADL Goals ADL Goal: Toilet Transfer - Progress: Progressing toward goals Miscellaneous OT Goals OT Goal: Miscellaneous Goal #1 - Progress: Progressing toward goals  Visit Information  Last OT Received On: 04/08/13 Assistance Needed: +1    Subjective Data  Subjective: I am doing alright Patient Stated Goal: none stated but agreeable to do a bath   Prior Functioning       Cognition  Cognition Arousal/Alertness:  Awake/alert Overall Cognitive Status: Within Functional Limits for tasks assessed    Mobility  Transfers Transfers: Sit to Stand;Stand to Sit Sit to Stand: 5: Supervision;With upper extremity assist;Without upper extremity assist;From chair/3-in-1;From toilet Stand to Sit: 5: Supervision;With upper extremity assist;Without upper extremity assist;To chair/3-in-1;To toilet Details for Transfer Assistance: supervision to stand and sit on toilet without grab bar use. Used armrest of chair to stand and sit in room.    Exercises      Balance Balance Balance Assessed: Yes Dynamic Standing Balance Dynamic Standing - Level of Assistance: 5: Stand by assistance   End of Session OT - End of Session Activity Tolerance: Patient tolerated treatment well Patient left: in chair;with call bell/phone within reach;with chair alarm set  GO     Lennox Laity [phone] 04/08/2013, 10:03 AM

## 2013-04-08 NOTE — Progress Notes (Signed)
Spoke with RT concerning whether pt was on BiPAP last evening. RT said that is was placed on pt approx. then taken off of pt.  Pt also stated that she was not on BiPaP last night.

## 2013-04-08 NOTE — Progress Notes (Signed)
RN received lab results from 0500 ABG.  Will report these findings to day nurse to be followed up with CCM as planned.  Pt resting, no complaints at this time. Will continue to monitor.

## 2013-04-08 NOTE — Progress Notes (Signed)
Pt has chosen [facility] to provide HPT services, referral made.

## 2013-04-08 NOTE — Progress Notes (Signed)
Physical Therapy Treatment Patient Details Name: Floella Ensz MRN: [id number] DOB: 10/23/1971 Today's Date: 04/08/2013 Time: 4098-1191 PT Time Calculation (min): 23 min  PT Assessment / Plan / Recommendation Comments on Treatment Session  Pt OOB in recliner.  Assisted to bathroom to void then amb in hallway w/o AD.  RA at rest 93%.  RA during gait avg 93% and only mild c/o SOB with activity.    Follow Up Recommendations  Home health PT     Does the patient have the potential to tolerate intense rehabilitation     Barriers to Discharge        Equipment Recommendations  None recommended by PT    Recommendations for Other Services    Frequency Min 3X/week   Plan Discharge plan remains appropriate    Precautions / Restrictions Precautions Precautions: Fall Precaution Comments: monitor sats. Restrictions Weight Bearing Restrictions: No   Pertinent Vitals/Pain No c/o pain   Mobility  Bed Mobility Bed Mobility: Not assessed Details for Bed Mobility Assistance: Pt OOB in recliner Transfers Transfers: Sit to Stand;Stand to Sit Sit to Stand: 5: Supervision;From chair/3-in-1;From toilet Stand to Sit: To chair/3-in-1;To toilet Details for Transfer Assistance: 25% Vc's on safety with ahnd placement Ambulation/Gait Ambulation/Gait Assistance: 5: Supervision;4: Min guard Ambulation Distance (Feet): 285 Feet Assistive device: None Ambulation/Gait Assistance Details: lateral sway  Gait Pattern: Wide base of support;Step-through pattern     PT Goals                                                    progressing    Visit Information  Last PT Received On: 04/08/13 Assistance Needed: +1    Subjective Data      Cognition  Cognition Arousal/Alertness: Awake/alert Overall Cognitive Status: Within Functional Limits for tasks assessed    Balance  Balance Balance Assessed: Yes Dynamic Standing Balance Dynamic Standing - Level of Assistance: 5: Stand by assistance  End of  Session PT - End of Session Equipment Utilized During Treatment: Gait belt Activity Tolerance: Patient tolerated treatment well Patient left: in chair;with call bell/phone within reach;with chair alarm set   Felecia Shelling  PTA [facility]  Acute  Rehab Pager      [phone]

## 2013-04-08 NOTE — Discharge Summary (Signed)
Physician Discharge Summary     Patient ID: [name] MRN: [id number] DOB/AGE: 1971-04-16 42 y.o.  Admit date: 03/31/2013 Discharge date: 04/08/2013  Admission Diagnoses: Acute respiratory failure   Discharge Diagnoses:  Principal Problem:   Acute respiratory failure with hypoxia Active Problems:   Bipolar I disorder, most recent episode (or current) manic   HCAP (healthcare-associated pneumonia)   Abnormal EKG   Leukocytosis   HTN (hypertension)   Hyperlipidemia   Diabetes   Vitamin D deficiency   Anemia   Elevated troponin   Diastolic CHF, acute on chronic   OSA (obstructive sleep apnea)   Pneumonia   Significant Hospital tests/ studies/ interventions and procedures  STUDIES:  4/14 Echo >>EF 55%, Grade 2 Diastolic Dysfunction, no valvular abnormalities  4/14 CT chest >> dilated PA, no PE, patchy b/l infiltrates  4/17 BNP 234 / ESR 20 / PCT neg   CULTURES:  4/14 Legionella Ag >> negative  4/14 Pneumococcal Ag >> negative  4/14 HIV >> negative  4/14 Blood >> neg  4/15 Sputum >> NTF  4/16 UC >> negative   ANTIBIOTICS:  4/14 Cefepime >>4/17  4/14 Levaquin >>  4/14 Vancomycin >>4/17     BRIEF PATIENT DESCRIPTION:  42 y/o never smoker admitted with productive cough and dyspnea. Found to have pneumonia. PCCM consulted to assist with management of respiratory failure.    Hospital Course:  Acute on chronic hypoxic, hypercapnic respiratory failure likely from CAP-NOS (failed out-pt rx) in setting of OSA/OHS and acute on chronic diastolic heart failure.   Admitted initially to the ICU. Treated in usual manor which included: IV antibiotics, NIPPV, diuresis and supportive care. [name] has continued to improve sym[ptomatically however her morning ABG suggests some baseline hypercarbia.  ABG    Component Value Date/Time   PHART 7.289* 04/08/2013 0502   PCO2ART 62.3* 04/08/2013 0502   PO2ART 89.8 04/08/2013 0502   HCO3 28.9* 04/08/2013 0502   TCO2 27.7 04/08/2013 0502     O2SAT 95.9 04/08/2013 0502  This was obtained on supplemental oxygen only, but no BIPAP. [name] had been doing clinically well w/ BIPAP after transfer out of the ICU. Walking oximetry was checked on room air and [name] had no episodes of desaturation on day of discharge.  At this point the plan is to complete her current antibiotic regimen, and send her home on auto-titration BIPAP. [name] will follow up in our office for further evaluation and titration of therapies for her chronic respiratory failure.  P:  -continue Levaquin 750 po (need to complete 10d total so stop date = 4/24)  -continue albuterol NEBS  -will need outpt evaluation with PFT and will defer sleep study to Dr Craige Cotta: May 19th at 415 pm.  -will schedule intermittent visit w/ T parrett on May 2 at 2pm  -walking oximetry prior to d/c  -  Hypertension with acute on chronic diastolic dysfx.  Hx of hyperlipidemia.  P:  -continue asa,zocor > change lisinopril to arb 4/21 in setting of resp failure  -goal even fluid balance. Will have PRN lasix for lower extremity swelling, 3 lb weight gain in 24 hours or 5 lb wt gain in week.   Anemia- Fe defic  P:  -rx w/ oral Fe, but may need IV iron...  -continue B-12 orally as well   DM type II.  P:  Metform   Hx of Bipolar.  Hx of hydrocephalus as infant s/p VP shunt.  P:  -continue depakote, lamictal, saphris.  Discharge Exam: BP 133/81  Pulse 91  Temp(Src) 97.7 F (36.5 C) (Axillary)  Resp 18  Ht 4\' 9"  (1.448 m)  Wt 81.92 kg (180 lb 9.6 oz)  BMI 39.07 kg/m2  SpO2 96%  LMP 03/13/2013 PHYSICAL EXAMINATION:  General: No distress  Neuro: Sleepy, easily awakens, normal strength, follows commands  HEENT: PERRLA, no sinus tenderness, MP 4, no LAN  Cardiovascular: s1s2 regular, no murmur  Lungs: resp's even/non-labored, lungs bilaterally diminished  Abdomen: Soft, non tender, normal bowel sounds  Musculoskeletal: No edema  Skin: No rashes  Labs at discharge Lab Results  Component  Value Date   CREATININE 0.63 04/06/2013   BUN 9 04/06/2013   NA 134* 04/06/2013   K 4.4 04/06/2013   CL 98 04/06/2013   CO2 29 04/06/2013   Lab Results  Component Value Date   WBC 16.3* 04/04/2013   HGB 8.3* 04/04/2013   HCT 32.4* 04/04/2013   MCV 78.6 04/04/2013   PLT 404* 04/04/2013   Lab Results  Component Value Date   ALT 19 03/16/2013   AST 41* 03/16/2013   ALKPHOS 97 03/16/2013   BILITOT 0.3 03/16/2013   Lab Results  Component Value Date   INR 1.11 03/31/2013    Current radiology studies No results found.  Disposition:  01-Home or Self Care      Discharge Orders   Future Appointments Provider Department Dept Phone   04/18/2013 2:00 PM Julio Sicks, NP [facility] [phone]   05/05/2013 4:15 PM Coralyn Helling, MD  [facility] [phone]   Future Orders Complete By Expires     (HEART FAILURE PATIENTS) Call MD:  Anytime you have any of the following symptoms: 1) 3 pound weight gain in 24 hours or 5 pounds in 1 week 2) shortness of breath, with or without a dry hacking cough 3) swelling in the hands, feet or stomach 4) if you have to sleep on extra pillows at night in order to breathe.  As directed     Call MD for:  difficulty breathing, headache or visual disturbances  As directed     Call MD for:  extreme fatigue  As directed     Call MD for:  temperature >100.4  As directed     Diet - low sodium heart healthy  As directed     Discharge instructions  As directed     Comments:      Take 1 lasix and 1 potassium for ankle/lower leg swelling and weight gain of 3 lbs in 24 hours OR weight gain of 5 lbs in 1 week    Increase activity slowly  As directed         Medication List    STOP taking these medications       benzonatate 200 MG capsule  Commonly known as:  TESSALON     lisinopril 5 MG tablet  Commonly known as:  PRINIVIL,ZESTRIL      TAKE these medications       asenapine 5 MG Subl  Commonly known as:  SAPHRIS  Place 10 mg under the  tongue at bedtime.     aspirin 81 MG tablet  Take 1 tablet (81 mg total) by mouth daily. For heart health     divalproex 500 MG 24 hr tablet  Commonly known as:  DEPAKOTE ER  Take 500 mg by mouth daily.     ferrous sulfate 325 (65 FE) MG tablet  Take 1 tablet (325 mg total) by mouth 2 (two) times daily with  a meal.     furosemide 40 MG tablet  Commonly known as:  LASIX  Take 1 tablet (40 mg total) by mouth daily as needed (for weight gain of > 3 lbs in 24 hr w/ lower ext swelling).     irbesartan 150 MG tablet  Commonly known as:  AVAPRO  Take 1 tablet (150 mg total) by mouth daily.     lamoTRIgine 200 MG tablet  Commonly known as:  LAMICTAL  Take 100 mg by mouth daily. Takes 1/2 tablet daily     levofloxacin 750 MG tablet  Commonly known as:  LEVAQUIN  Take 1 tablet (750 mg total) by mouth daily.     metFORMIN 1000 MG tablet  Commonly known as:  GLUCOPHAGE  Take 1 tablet (1,000 mg total) by mouth daily with breakfast. For high blood sugar control     MSM PO  Take 2 tablets by mouth daily.     multivitamin with minerals Tabs  Take 1 tablet by mouth daily.     potassium chloride 10 MEQ tablet  Commonly known as:  K-DUR  Take 2 tablets (20 mEq total) by mouth daily as needed (take only on days that you take lasix).     pravastatin 80 MG tablet  Commonly known as:  PRAVACHOL  Take 1 tablet (80 mg total) by mouth daily. For high cholesterol control     traMADol 50 MG tablet  Commonly known as:  ULTRAM  Take 50 mg by mouth 2 (two) times daily as needed (migraine headache.).     vitamin B-12 1000 MCG tablet  Commonly known as:  CYANOCOBALAMIN  Take 2,000 mcg by mouth daily. For Vitamin B-12 deficiency       Follow-up Information   Schedule an appointment as soon as possible for a visit with Juline Patch, MD.   Contact information:   [address], [address] [place] [address] [phone]       Follow up with PARRETT,TAMMY, NP On 04/18/2013. (2 pm)     Contact information:   [address] [place] [address] [phone]       Follow up with Coralyn Helling, MD On 05/05/2013. (415 pm)    Contact information:   [address] [place] [address] [phone]       Discharged Condition: good  Physician Statement:   The Patient was personally examined, the discharge assessment and plan has been personally reviewed and I agree with ACNP Babcock's assessment and plan. > 30 minutes of time have been dedicated to discharge assessment, planning and discharge instructions.     Sandrea Hughs, MD Pulmonary and Critical Care Medicine [facility] Cell [phone] After 5:30 PM or weekends, call [phone]

## 2013-04-08 NOTE — Progress Notes (Signed)
Referral made to [facility] for Bipap for home use.

## 2013-04-18 ENCOUNTER — Ambulatory Visit (INDEPENDENT_AMBULATORY_CARE_PROVIDER_SITE_OTHER)
Admission: RE | Admit: 2013-04-18 | Discharge: 2013-04-18 | Disposition: A | Discharge status: Home / Self Care | Payer: 59 | Source: Ambulatory Visit | Attending: Adult Health | Admitting: Adult Health

## 2013-04-18 ENCOUNTER — Encounter: Payer: Self-pay | Admitting: Adult Health

## 2013-04-18 ENCOUNTER — Ambulatory Visit (INDEPENDENT_AMBULATORY_CARE_PROVIDER_SITE_OTHER): Payer: 59 | Admitting: Adult Health

## 2013-04-18 VITALS — BP 130/100 | HR 79 | Temp 99.5°F | Ht <= 58 in | Wt 175.0 lb

## 2013-04-18 DIAGNOSIS — J189 Pneumonia, unspecified organism: Secondary | ICD-10-CM

## 2013-04-18 DIAGNOSIS — G4733 Obstructive sleep apnea (adult) (pediatric): Secondary | ICD-10-CM

## 2013-04-18 NOTE — Assessment & Plan Note (Signed)
HCAP - now improving  cxr shows improved aeration with decreased densities  Cont on current reigmen  follow up Dr. Craige Cotta  W/ cxr until cleared completely.

## 2013-04-18 NOTE — Assessment & Plan Note (Signed)
OHS/OSA -continue on BIPAP At bedtime   Weight loss  follow up 2 weeks with Dr. Craige Cotta

## 2013-04-18 NOTE — Patient Instructions (Addendum)
Continue on current regimen follow up Dr. Craige Cotta  In 2 weeks with chest xray  Continue on BIPAP At bedtime

## 2013-04-18 NOTE — Progress Notes (Signed)
  Subjective:    Patient ID: [name], female    DOB: Feb 28, 1971, 42 y.o.   MRN: [id number]  HPI 42 yo never smoker seen for initial PCCM consult during hospital admit for PNA in setting of OSA/OHS and decompensated diastolic dysfunction.   04/18/2013 Post Hospital follow up  Pt returns for post hospital follow up  Admitted 4/14 for PNA , complicated by acute on chronic hypercarbic resp failure in setting of OHS/OSA and decompensated diastolic dysfunction.  Tx w/ IV abx, neb. ,BIPAP Discharged on Levaquin and BIPAP.  ACE inhibitor changed to ARB  Sent on home at BIPAP , starting to get used to it.  On average 4-6 hours each night.  CXR today is improving w/ decreased bilateral aspdz.  Leg swelling is decreased.  No chest pain , orthopnea, hemoptysis,   SH:  Married 2 kids No pets  Never smoker,  No etoh/drugs.  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  HEENT:   No headaches,  Difficulty swallowing,  Tooth/dental problems, or  Sore throat,                No sneezing, itching, ear ache,  +nasal congestion, post nasal drip,   CV:  No chest pain,  Orthopnea, PND, swelling in lower extremities, anasarca, dizziness, palpitations, syncope.   GI  No heartburn, indigestion, abdominal pain, nausea, vomiting, diarrhea, change in bowel habits, loss of appetite, bloody stools.   Resp:    No chest wall deformity  Skin: no rash or lesions.  GU: no dysuria, change in color of urine, no urgency or frequency.  No flank pain, no hematuria   MS:  No joint pain or swelling.  No decreased range of motion.  No back pain.  Psych:    No memory loss.         Objective:   Physical Exam  GEN: A/Ox3; pleasant , NAD  HEENT:  [place]/AT,  EACs-clear, TMs-wnl, NOSE-clear, THROAT-clear, no lesions, no postnasal drip or exudate noted. Class 2 airway   NECK:  Supple w/ fair ROM; no  JVD; normal carotid impulses w/o bruits; no thyromegaly or nodules palpated; no lymphadenopathy.  RESP  Clear  P & A; w/o, wheezes/ rales/ or rhonchi.no accessory muscle use, no dullness to percussion  CARD:  RRR, no m/r/g  , no peripheral edema, pulses intact, no cyanosis or clubbing.  GI:   Soft & nt; nml bowel sounds; no organomegaly or masses detected.  Musco: Warm bil, no deformities or joint swelling noted.   Neuro: alert, no focal deficits noted.    Skin: Warm, no lesions or rashes        Assessment & Plan:

## 2013-04-20 NOTE — Progress Notes (Signed)
Reviewed and agree with assessment/plan. 

## 2013-05-05 ENCOUNTER — Ambulatory Visit (INDEPENDENT_AMBULATORY_CARE_PROVIDER_SITE_OTHER)
Admission: RE | Admit: 2013-05-05 | Discharge: 2013-05-05 | Disposition: A | Discharge status: Home / Self Care | Payer: 59 | Source: Ambulatory Visit | Attending: Pulmonary Disease | Admitting: Pulmonary Disease

## 2013-05-05 ENCOUNTER — Encounter: Payer: Self-pay | Admitting: Pulmonary Disease

## 2013-05-05 ENCOUNTER — Ambulatory Visit (INDEPENDENT_AMBULATORY_CARE_PROVIDER_SITE_OTHER): Payer: 59 | Admitting: Pulmonary Disease

## 2013-05-05 VITALS — BP 140/100 | HR 95 | Temp 98.7°F | Ht <= 58 in | Wt 170.6 lb

## 2013-05-05 DIAGNOSIS — J9600 Acute respiratory failure, unspecified whether with hypoxia or hypercapnia: Secondary | ICD-10-CM

## 2013-05-05 DIAGNOSIS — J9601 Acute respiratory failure with hypoxia: Secondary | ICD-10-CM

## 2013-05-05 DIAGNOSIS — J189 Pneumonia, unspecified organism: Secondary | ICD-10-CM

## 2013-05-05 DIAGNOSIS — G4733 Obstructive sleep apnea (adult) (pediatric): Secondary | ICD-10-CM

## 2013-05-05 NOTE — Assessment & Plan Note (Signed)
Improved clinically and on chest xray.  Will arrange for PFT's to assess whether she has residual deficit.

## 2013-05-05 NOTE — Progress Notes (Signed)
Chief Complaint  Patient presents with  . Hospitalization Follow-up    CXR today-- admit w resp.failure-- sicne d/c pt reports breathing is doing better, denies any new concerns at this time--wearing bipap everynight x 5-8 hrs per night--tolerating pressures fine    History of Present Illness: [name] is a 41 y.o. female never smoker with recent episode of pneumonia, hypoxic respiratory failure, and acute on chronic diastolic dysfunction.  She also has hx of OSA and possible OHS.  Her breathing is doing better since she was in hospital.  She is not having cough, wheeze, sputum, chest pain, fever, or leg swelling.  She works 3rd shift.  She gets about 6 hours sleep per day.  She is using her BiPAP machine 4 to 6 hours per night.  She has full face mask.  This helps her sleep.  She has not had sleep study since she has been out of hospital.  She had sleep study about 8 years ago while living in [place].  She snores, and wakes up feeling like she is choking if she does not use BiPAP.  She also feels more tired at work if she does not use BiPAP.  As a result she has been using BiPAP on a regular basis now.  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  [name]  has past surgical history that includes Ventriculoperitoneal shunt; Cesarean section; and Cyst excision.  Prior to Admission medications   Medication Sig Start Date End Date Taking? Authorizing Provider  asenapine (SAPHRIS) 5 MG SUBL Place 10 mg under the tongue at  bedtime.   Yes Historical Provider, MD  aspirin 81 MG tablet Take 1 tablet (81 mg total) by mouth daily. For heart health 03/21/13  Yes Sanjuana Kava, NP  divalproex (DEPAKOTE ER) 500 MG 24 hr tablet Take 500 mg by mouth daily.    Yes Historical Provider, MD  ferrous sulfate 325 (65 FE) MG tablet Take 1 tablet (325 mg total) by mouth 2 (two) times daily with a meal. 04/08/13  Yes Simonne Martinet, NP  furosemide (LASIX) 40 MG tablet Take 1 tablet (40 mg total) by mouth daily as needed (for weight gain of > 3 lbs in 24 hr w/ lower ext swelling). 04/08/13  Yes Simonne Martinet, NP  irbesartan (AVAPRO) 150 MG tablet Take 1 tablet (150 mg total) by mouth daily. 04/08/13  Yes Simonne Martinet, NP  lamoTRIgine (LAMICTAL) 200 MG tablet Take 100 mg by mouth daily. Takes 1/2 tablet daily   Yes Historical Provider, MD  metFORMIN (GLUCOPHAGE) 1000 MG tablet Take 1 tablet (1,000 mg total) by mouth daily with breakfast. For high blood sugar control 03/21/13  Yes Sanjuana Kava, NP  Methylsulfonylmethane (MSM PO) Take 2 tablets by mouth daily.    Yes Historical Provider, MD  Multiple Vitamin (MULTIVITAMIN WITH MINERALS) TABS Take 1 tablet by mouth daily.   Yes Historical Provider, MD  potassium chloride (K-DUR) 10 MEQ tablet Take 2 tablets (20 mEq total) by mouth daily as needed (take only on days that you take lasix). 04/08/13  Yes Simonne Martinet, NP  pravastatin (PRAVACHOL) 80 MG  tablet Take 1 tablet (80 mg total) by mouth daily. For high cholesterol control 03/21/13  Yes Sanjuana Kava, NP  traMADol (ULTRAM) 50 MG tablet Take 50 mg by mouth 2 (two) times daily as needed (migraine headache.).    Yes Historical Provider, MD  vitamin B-12 (CYANOCOBALAMIN) 1000 MCG tablet Take 2,000 mcg by mouth daily. For Vitamin B-12 deficiency 03/21/13  Yes Sanjuana Kava, NP    Allergies  Allergen Reactions  . Abilify (Aripiprazole)     hyperglycemia     Physical Exam:  General - No distress ENT - No sinus tenderness, MP 4, no oral  exudate, no LAN Cardiac - s1s2 regular, no murmur Chest - No wheeze/rales/dullness Back - No focal tenderness Abd - Soft, non-tender Ext - No edema Neuro - Normal strength Skin - No rashes Psych - normal mood, and behavior  Dg Chest 2 View  05/05/2013   *RADIOLOGY REPORT*  Clinical Data: Acute respiratory failure with hypoxia  CHEST - 2 VIEW  Comparison: Apr 18, 2013.  Findings: Cardiomediastinal silhouette appears normal.  Right-sided ventriculoperitoneal shunt is again noted and unchanged.  No acute pulmonary disease is noted.  No pleural effusion is noted.  Bony thorax intact.  IMPRESSION: No acute cardiopulmonary abnormality seen.   Original Report Authenticated By: Lupita Raider.,  M.D.    Assessment/Plan:  Coralyn Helling, MD [facility] Pager:  [phone]

## 2013-05-05 NOTE — Assessment & Plan Note (Signed)
She has history of sleep apnea, hypertension, and diastolic heart failure.  She has been doing well with BiPAP since hospital stay for OSA and possible OHS with acute respiratory failure from pneumonia.  Will arrange for sleep study to further assess for presence of sleep apnea, and then determine whether she needs to continue with BiPAP therapy.

## 2013-05-05 NOTE — Patient Instructions (Signed)
Will schedule breathing test (PFT) and call with results Will schedule sleep study and call with results Follow up in 3 months

## 2013-05-22 ENCOUNTER — Ambulatory Visit (INDEPENDENT_AMBULATORY_CARE_PROVIDER_SITE_OTHER): Payer: 59 | Admitting: Pulmonary Disease

## 2013-05-22 DIAGNOSIS — R0602 Shortness of breath: Secondary | ICD-10-CM

## 2013-05-22 LAB — PULMONARY FUNCTION TEST

## 2013-05-22 NOTE — Progress Notes (Signed)
PFT done today. 

## 2013-05-27 ENCOUNTER — Telehealth: Payer: Self-pay | Admitting: Pulmonary Disease

## 2013-05-27 NOTE — Telephone Encounter (Signed)
PFT 05/22/13 >> FEV1 1.05 (57%), FEV1% 80, TLC 2.87 (73%), DLCO 107%, borderline BD response.  Results d/w pt over phone.

## 2013-06-04 ENCOUNTER — Encounter (HOSPITAL_BASED_OUTPATIENT_CLINIC_OR_DEPARTMENT_OTHER): Payer: Self-pay

## 2013-06-04 ENCOUNTER — Encounter: Payer: Self-pay | Admitting: Pulmonary Disease

## 2013-06-16 ENCOUNTER — Encounter (HOSPITAL_BASED_OUTPATIENT_CLINIC_OR_DEPARTMENT_OTHER): Payer: Self-pay

## 2013-06-25 ENCOUNTER — Ambulatory Visit (HOSPITAL_BASED_OUTPATIENT_CLINIC_OR_DEPARTMENT_OTHER): Payer: 59 | Attending: Pulmonary Disease | Admitting: Radiology

## 2013-06-25 VITALS — Ht <= 58 in | Wt 162.0 lb

## 2013-06-25 DIAGNOSIS — I10 Essential (primary) hypertension: Secondary | ICD-10-CM | POA: Insufficient documentation

## 2013-06-25 DIAGNOSIS — Z9989 Dependence on other enabling machines and devices: Secondary | ICD-10-CM

## 2013-06-25 DIAGNOSIS — Z8701 Personal history of pneumonia (recurrent): Secondary | ICD-10-CM | POA: Insufficient documentation

## 2013-06-25 DIAGNOSIS — J9601 Acute respiratory failure with hypoxia: Secondary | ICD-10-CM

## 2013-06-25 DIAGNOSIS — E119 Type 2 diabetes mellitus without complications: Secondary | ICD-10-CM | POA: Insufficient documentation

## 2013-06-25 DIAGNOSIS — G479 Sleep disorder, unspecified: Secondary | ICD-10-CM | POA: Insufficient documentation

## 2013-06-29 DIAGNOSIS — R0989 Other specified symptoms and signs involving the circulatory and respiratory systems: Secondary | ICD-10-CM

## 2013-06-29 DIAGNOSIS — R0609 Other forms of dyspnea: Secondary | ICD-10-CM

## 2013-06-30 ENCOUNTER — Telehealth: Payer: Self-pay | Admitting: Pulmonary Disease

## 2013-06-30 NOTE — Telephone Encounter (Signed)
LMTCB

## 2013-06-30 NOTE — Procedures (Signed)
NAMETAMME, MOZINGO NO.:  [id number]  MEDICAL RECORD NO.:  [id number]          PATIENT TYPE:  OUT  LOCATION:  SLEEP CENTER                 FACILITY:  [facility]  PHYSICIAN:  Coralyn Helling, MD        DATE OF BIRTH:  04/25/1971  DATE OF STUDY:  06/25/2013                           NOCTURNAL POLYSOMNOGRAM  REFERRING PHYSICIAN:  Coralyn Helling, MD  INDICATION FOR STUDY:  Mr. Bonine is a 42 year old female who has a history of hypertension and diabetes.  She had a previous sleep study in 2006 by which report showed obstructive sleep apnea.  She had recent hospitalization for pneumonia requiring supplemental oxygen on BiPAP therapy.  She was referred to the sleep lab for further evaluation of sleep-disordered breathing.  Height is 4 feet 9 inches, weight is 162 pounds, BMI is 35, neck size is 13 inches.  EPWORTH SLEEPINESS SCORE:  9.  MEDICATIONS:  Reviewed in her chart.  SLEEP ARCHITECTURE:  Total recording time was 363 minutes.  Total sleep time was 309 minutes.  Sleep efficiency was 85%.  Sleep latency was 1 minute.  REM latency 164 minutes.  The study was notable for lack of slow-wave sleep and she slept in both the supine and nonsupine positions.  RESPIRATORY DATA:  The average respiratory rate was 18.  Loud snoring was noted by the technician.  The overall apnea/hypopnea index was 1.7. The respiratory disturbance index was 5.6.  There was 1 central apneic event.  The remainder of the events were obstructive in nature.  OXYGEN DATA:  The baseline oxygenation was 92%.  The oxygen saturation nadir was 80%.  The patient had persistent oxygen desaturations in the absence of respiratory events.  She was therefore started on 1 liter of supplemental oxygen with improvement in her oxygenation.  CARDIAC DATA:  The average heart rate 77 and the rhythm strip showed sinus rhythm with occasional PVCs.  MOVEMENT-PARASOMNIA:  The periodic limb movement index was 0 and  the patient had 1 restroom trip.  IMPRESSIONS-RECOMMENDATIONS:  This study did not show evidence for significant obstructive or central sleep apnea.  Her overall apnea/hypopnea index was 1.7.  She did have significant oxygen desaturations in the absence of respiratory events.  This is suggestive of sleep related hypoventilation.  This was improved with the addition of 1 L of supplemental oxygen.  In addition to diet, exercise, and weight reduction, the patient should undergo a overnight oximetry as an outpatient to determine whether she would benefit from the addition of supplemental oxygen while asleep.     Coralyn Helling, MD Diplomat, American Board of Sleep Medicine    VS/MEDQ  D:  06/29/2013 12:28:23  T:  06/30/2013 06:19:30  Job:  [id number]

## 2013-06-30 NOTE — Telephone Encounter (Signed)
PSG 06/25/13 >> AHI 1.7, RDI 5.6, SpO2 low 80%.  Needed 1 liter oxygen.  Will have my nurse schedule ROV to review sleep study.

## 2013-07-02 NOTE — Telephone Encounter (Signed)
LMTCB x2  

## 2013-07-09 ENCOUNTER — Encounter: Payer: Self-pay | Admitting: *Deleted

## 2013-07-09 NOTE — Telephone Encounter (Signed)
We have tried to contact this pt several times. A letter will be sent to her about this matter.

## 2013-07-15 ENCOUNTER — Telehealth: Payer: Self-pay | Admitting: Pulmonary Disease

## 2013-07-15 DIAGNOSIS — IMO0002 Reserved for concepts with insufficient information to code with codable children: Secondary | ICD-10-CM

## 2013-07-15 DIAGNOSIS — G4736 Sleep related hypoventilation in conditions classified elsewhere: Secondary | ICD-10-CM

## 2013-07-15 NOTE — Telephone Encounter (Signed)
Pt returned call.  Holly D Pryor ° °

## 2013-07-15 NOTE — Telephone Encounter (Signed)
Coralyn Helling, MD at 06/30/2013 1:01 PM   Status: Signed            PSG 06/25/13 >> AHI 1.7, RDI 5.6, SpO2 low 80%. Needed 1 liter oxygen.  Will have my nurse schedule ROV to review sleep study.   ATC patient, no answer LMOMTCB Telephone msg taken on 06/30/13 Lillia Abed has sent letter to patients home as we have tried to contact patient several times

## 2013-07-15 NOTE — Telephone Encounter (Signed)
LMTCBx1. Pt has appt on 09-09-13, she can keep that or we can schedule her sooner. Carron Curie, CMA

## 2013-07-16 NOTE — Telephone Encounter (Signed)
Pt would like to keep her 09/09/13 appt.  Pt would like to know if there is any info she can have prior to the appt.  [name]

## 2013-07-16 NOTE — Telephone Encounter (Signed)
Please advise Dr. Craige Cotta if we can give pt any information? thanks

## 2013-07-17 NOTE — Telephone Encounter (Signed)
Called, spoke with pt.  Explained below per VS to her.  She verbalized understanding and is aware she will receive another call to set ONO up.  This order has already been placed by VS.  She voiced no further questions or concerns at this time.

## 2013-07-17 NOTE — Telephone Encounter (Signed)
Can inform pt that sleep study did not show evidence for sleep apnea, but did have low oxygen level at night that improved after adding 1 liter oxygen.  She did not need BiPAP during study.  Will arrange for ONO with room air.  Based on results will call her back to discuss whether she should continue BiPAP at night of change to supplemental oxygen at night.

## 2013-09-09 ENCOUNTER — Encounter: Payer: Self-pay | Admitting: Pulmonary Disease

## 2013-09-09 ENCOUNTER — Ambulatory Visit (INDEPENDENT_AMBULATORY_CARE_PROVIDER_SITE_OTHER): Payer: 59 | Admitting: Pulmonary Disease

## 2013-09-09 VITALS — BP 130/84 | HR 78 | Temp 97.8°F | Ht <= 58 in | Wt 169.0 lb

## 2013-09-09 DIAGNOSIS — G4739 Other sleep apnea: Secondary | ICD-10-CM

## 2013-09-09 DIAGNOSIS — IMO0002 Reserved for concepts with insufficient information to code with codable children: Secondary | ICD-10-CM

## 2013-09-09 DIAGNOSIS — G4736 Sleep related hypoventilation in conditions classified elsewhere: Secondary | ICD-10-CM

## 2013-09-09 DIAGNOSIS — E662 Morbid (severe) obesity with alveolar hypoventilation: Secondary | ICD-10-CM

## 2013-09-09 NOTE — Patient Instructions (Signed)
Will arrange for overnight oxygen test and call with results Follow up in 6 months 

## 2013-09-09 NOTE — Assessment & Plan Note (Signed)
She did not have her ONO done yet.  Will re-order this.  Will call her with results.  If her ONO is unremarkable, then she does not need additional pulmonary follow up.

## 2013-09-09 NOTE — Progress Notes (Signed)
Chief Complaint  Patient presents with  . Follow-up    Breathing has improved. Denies chest tightness, SOB, coughing or wheezing at this time.    History of Present Illness: [name] is a 42 y.o. 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  She her last visit with me she had PFT's which showed mild restrictive defect likely related to her weight.  She also had sleep study which was negative for sleep apnea, but did show oxygen desaturation while asleep.  She was to have room air overnight oximetry, but this has not been done.  She denies cough, wheeze, sputum, leg swelling or chest pain.  She does not feel her breathing limits her activities.    TESTS: 03/31/13 Echo >>EF 55%, Grade 2 diastolic dysfx 03/31/13 CT chest >> dilated PA, no PE, patchy b/l infiltrates 05/05/13 CXR >> resolved pulmonary infiltrates PFT 05/22/13 >> FEV1 1.05 (57%), FEV1% 80, TLC 2.87 (73%), DLCO 107%, borderline BD response. PSG 06/25/13 >> AHI 1.7, RDI 5.6, SpO2 low 80%.  Needed 1 liter oxygen.   [name]  has a past medical history of Hypertension; Diabetes mellitus without complication; Hyperlipidemia (03/31/2013); Bipolar disorder (03/31/2013); Diabetes (03/31/2013); Vitamin D deficiency (03/31/2013); Anemia microcytic (03/31/2013); Rhabdomyolysis (03/31/2013); and OSA (obstructive sleep apnea).  [name]  has past surgical history that includes Ventriculoperitoneal shunt; Cesarean section; and Cyst excision.  Prior to Admission medications   Medication Sig Start Date End Date Taking? Authorizing Provider  asenapine (SAPHRIS) 5 MG SUBL Place 10 mg under the tongue at bedtime.   Yes Historical Provider, MD  aspirin 81 MG tablet Take 1 tablet (81 mg total) by mouth daily. For heart health 03/21/13  Yes Sanjuana Kava, NP  divalproex (DEPAKOTE ER) 500 MG 24 hr tablet Take 500 mg by mouth daily.    Yes Historical Provider, MD  ferrous sulfate 325 (65 FE) MG tablet Take 1 tablet (325 mg total) by mouth  2 (two) times daily with a meal. 04/08/13  Yes Simonne Martinet, NP  furosemide (LASIX) 40 MG tablet Take 1 tablet (40 mg total) by mouth daily as needed (for weight gain of > 3 lbs in 24 hr w/ lower ext swelling). 04/08/13  Yes Simonne Martinet, NP  irbesartan (AVAPRO) 150 MG tablet Take 1 tablet (150 mg total) by mouth daily. 04/08/13  Yes Simonne Martinet, NP  lamoTRIgine (LAMICTAL) 200 MG tablet Take 100 mg by mouth daily. Takes 1/2 tablet daily   Yes Historical Provider, MD  metFORMIN (GLUCOPHAGE) 1000 MG tablet Take 1 tablet (1,000 mg total) by mouth daily with breakfast. For high blood sugar control 03/21/13  Yes Sanjuana Kava, NP  Methylsulfonylmethane (MSM PO) Take 2 tablets by mouth daily.    Yes Historical Provider, MD  Multiple Vitamin (MULTIVITAMIN WITH MINERALS) TABS Take 1 tablet by mouth daily.   Yes Historical Provider, MD  potassium chloride (K-DUR) 10 MEQ tablet Take 2 tablets (20 mEq total) by mouth daily as needed (take only on days that you take lasix). 04/08/13  Yes Simonne Martinet, NP  pravastatin (PRAVACHOL) 80 MG tablet Take 1 tablet (80 mg total) by mouth daily. For high cholesterol control 03/21/13  Yes Sanjuana Kava, NP  traMADol (ULTRAM) 50 MG tablet Take 50 mg by mouth 2 (two) times daily as needed (migraine headache.).    Yes Historical Provider, MD  vitamin B-12 (CYANOCOBALAMIN) 1000 MCG tablet Take 2,000 mcg by mouth daily. For Vitamin B-12 deficiency 03/21/13  Yes Nicole Kindred  I Elsie Saas, NP    Allergies  Allergen Reactions  . Abilify [Aripiprazole]     hyperglycemia     Physical Exam:  General - No distress ENT - No sinus tenderness, MP 4, no oral exudate, no LAN Cardiac - s1s2 regular, no murmur Chest - No wheeze/rales/dullness Back - No focal tenderness Abd - Soft, non-tender Ext - No edema Neuro - Normal strength Skin - No rashes Psych - normal mood, and behavior   Assessment/Plan:  Stacy Helling, MD [facility] Pager:   [phone]

## 2013-09-30 ENCOUNTER — Telehealth: Payer: Self-pay | Admitting: Pulmonary Disease

## 2013-09-30 DIAGNOSIS — IMO0002 Reserved for concepts with insufficient information to code with codable children: Secondary | ICD-10-CM

## 2013-09-30 DIAGNOSIS — G4736 Sleep related hypoventilation in conditions classified elsewhere: Secondary | ICD-10-CM

## 2013-09-30 NOTE — Telephone Encounter (Signed)
Returning call.

## 2013-09-30 NOTE — Telephone Encounter (Signed)
Called, spoke with pt.  Informed her of below results and recs per Dr. Craige Cotta.  She verbalized understanding of both and voiced no further questions or concerns at this time.  Pt did not have a reminder placed for a 6 month follow up with Dr. Craige Cotta.  I have placed this.  Pt aware she will receive a call to schedule appt once the schedule comes out.  Pt to call back with any problems, questions, or concerns prior to this.

## 2013-09-30 NOTE — Telephone Encounter (Signed)
ONO with RA 09/12/13 >> Test time 4 hrs 19 min.  Baseline SpO2 94%, low SpO2 79%.  Spent 21 min with SpO2 < 89%.  Attempted to contact pt.  Will have my nurse inform pt that ONO shows low oxygen while asleep.  Will need to start her on 1 liter oxygen while asleep.  Will repeat ONO with 1 liter oxygen and call her with results.  She should keep follow up appointment in 6 months.

## 2013-09-30 NOTE — Telephone Encounter (Signed)
lmtcb

## 2013-10-07 ENCOUNTER — Telehealth: Payer: Self-pay | Admitting: Pulmonary Disease

## 2013-10-07 NOTE — Telephone Encounter (Signed)
lmomtcb x1 for pt 

## 2013-10-07 NOTE — Telephone Encounter (Signed)
I spoke with the pt and she was confused about why she received oxygen. I advised the nurse spoke with her on 10-14 and gave her the results of ONO and that is why she received the oxygen because her levels were too low. Pt states understanding. Carron Curie, CMA

## 2013-10-07 NOTE — Telephone Encounter (Signed)
Pt returned call. [name] °

## 2013-10-23 ENCOUNTER — Other Ambulatory Visit: Payer: Self-pay

## 2014-02-23 ENCOUNTER — Emergency Department (HOSPITAL_COMMUNITY)
Admission: EM | Admit: 2014-02-23 | Discharge: 2014-02-23 | Disposition: A | Discharge status: Home / Self Care | Payer: 59 | Attending: Emergency Medicine | Admitting: Emergency Medicine

## 2014-02-23 ENCOUNTER — Encounter (HOSPITAL_COMMUNITY): Payer: Self-pay | Admitting: Emergency Medicine

## 2014-02-23 DIAGNOSIS — D649 Anemia, unspecified: Secondary | ICD-10-CM | POA: Insufficient documentation

## 2014-02-23 DIAGNOSIS — Z7982 Long term (current) use of aspirin: Secondary | ICD-10-CM | POA: Insufficient documentation

## 2014-02-23 DIAGNOSIS — Z8739 Personal history of other diseases of the musculoskeletal system and connective tissue: Secondary | ICD-10-CM | POA: Insufficient documentation

## 2014-02-23 DIAGNOSIS — Z8669 Personal history of other diseases of the nervous system and sense organs: Secondary | ICD-10-CM | POA: Insufficient documentation

## 2014-02-23 DIAGNOSIS — E119 Type 2 diabetes mellitus without complications: Secondary | ICD-10-CM | POA: Insufficient documentation

## 2014-02-23 DIAGNOSIS — Z9889 Other specified postprocedural states: Secondary | ICD-10-CM | POA: Insufficient documentation

## 2014-02-23 DIAGNOSIS — E785 Hyperlipidemia, unspecified: Secondary | ICD-10-CM | POA: Insufficient documentation

## 2014-02-23 DIAGNOSIS — E559 Vitamin D deficiency, unspecified: Secondary | ICD-10-CM | POA: Insufficient documentation

## 2014-02-23 DIAGNOSIS — Z9089 Acquired absence of other organs: Secondary | ICD-10-CM | POA: Insufficient documentation

## 2014-02-23 DIAGNOSIS — F319 Bipolar disorder, unspecified: Secondary | ICD-10-CM | POA: Insufficient documentation

## 2014-02-23 DIAGNOSIS — Z3202 Encounter for pregnancy test, result negative: Secondary | ICD-10-CM | POA: Insufficient documentation

## 2014-02-23 DIAGNOSIS — I10 Essential (primary) hypertension: Secondary | ICD-10-CM | POA: Insufficient documentation

## 2014-02-23 DIAGNOSIS — Z79899 Other long term (current) drug therapy: Secondary | ICD-10-CM | POA: Insufficient documentation

## 2014-02-23 DIAGNOSIS — R109 Unspecified abdominal pain: Secondary | ICD-10-CM | POA: Insufficient documentation

## 2014-02-23 LAB — CBC WITH DIFFERENTIAL/PLATELET
BASOS PCT: 0 % (ref 0–1)
Basophils Absolute: 0 10*3/uL (ref 0.0–0.1)
EOS ABS: 0.2 10*3/uL (ref 0.0–0.7)
EOS PCT: 2 % (ref 0–5)
HCT: 38.1 % (ref 36.0–46.0)
Hemoglobin: 11.3 g/dL — ABNORMAL LOW (ref 12.0–15.0)
LYMPHS ABS: 2.2 10*3/uL (ref 0.7–4.0)
Lymphocytes Relative: 21 % (ref 12–46)
MCH: 23 pg — AB (ref 26.0–34.0)
MCHC: 29.7 g/dL — AB (ref 30.0–36.0)
MCV: 77.6 fL — AB (ref 78.0–100.0)
Monocytes Absolute: 0.8 10*3/uL (ref 0.1–1.0)
Monocytes Relative: 8 % (ref 3–12)
NEUTROS PCT: 69 % (ref 43–77)
Neutro Abs: 7.2 10*3/uL (ref 1.7–7.7)
PLATELETS: 395 10*3/uL (ref 150–400)
RBC: 4.91 MIL/uL (ref 3.87–5.11)
RDW: 16.4 % — ABNORMAL HIGH (ref 11.5–15.5)
WBC: 10.4 10*3/uL (ref 4.0–10.5)

## 2014-02-23 LAB — CBG MONITORING, ED: GLUCOSE-CAPILLARY: 166 mg/dL — AB (ref 70–99)

## 2014-02-23 LAB — POC URINE PREG, ED: PREG TEST UR: NEGATIVE

## 2014-02-23 NOTE — ED Provider Notes (Signed)
CSN: [id number]     Arrival date & time 02/23/14  0125 History   First MD Initiated Contact with Patient 02/23/14 0147     Chief Complaint  Patient presents with  . Abdominal Pain     (Consider location/radiation/quality/duration/timing/severity/associated sxs/prior Treatment) HPI Comments: 43 year old female who presents with a complaint of abdominal cramping. This started at 11:30 PM a short time after taking her evening medications including metformin, Lamictal, amoxicillin and Depakote. She had not had anything to eat this evening, she went to work and started to have abdominal cramping. This was short-lived, it is now completely resolved and she has no symptoms whatsoever. At the time that cramping sensation was the periumbilical region and went down to her lower pelvis. This is not associated with diarrhea, dysuria, hematuria, nausea, vomiting, fevers or back pain. She has had prior surgery with 2 cesarean sections and a myomectomy.  Patient is a 43 y.o. female presenting with abdominal pain. The history is provided by the patient.  Abdominal Pain   Past Medical History  Diagnosis Date  . Hypertension   . Diabetes mellitus without complication   . Hyperlipidemia 03/31/2013  . Bipolar disorder 03/31/2013  . Diabetes 03/31/2013  . Vitamin D deficiency 03/31/2013  . Anemia microcytic 03/31/2013  . Rhabdomyolysis 03/31/2013    [place] secondary to abilify  . OSA (obstructive sleep apnea)    Past Surgical History  Procedure Laterality Date  . Ventriculoperitoneal shunt    . Cesarean section    . Cyst excision     History reviewed. No pertinent family history. History  Substance Use Topics  . Smoking status: Never Smoker   . Smokeless tobacco: Never Used  . Alcohol Use: No   OB History   Grav Para Term Preterm Abortions TAB SAB Ect Mult Living                 Review of Systems  Gastrointestinal: Positive for abdominal pain.  All other systems reviewed and are  negative.      Allergies  Abilify  Home Medications   Current Outpatient Rx  Name  Route  Sig  Dispense  Refill  . asenapine (SAPHRIS) 5 MG SUBL   Sublingual   Place 10 mg under the tongue at bedtime.         Marland Kitchen aspirin 81 MG tablet   Oral   Take 1 tablet (81 mg total) by mouth daily. For heart health   30 tablet      . divalproex (DEPAKOTE ER) 500 MG 24 hr tablet   Oral   Take 500 mg by mouth daily.          . ferrous sulfate 325 (65 FE) MG tablet   Oral   Take 1 tablet (325 mg total) by mouth 2 (two) times daily with a meal.   60 tablet   6   . furosemide (LASIX) 40 MG tablet   Oral   Take 1 tablet (40 mg total) by mouth daily as needed (for weight gain of > 3 lbs in 24 hr w/ lower ext swelling).   30 tablet   6   . irbesartan (AVAPRO) 150 MG tablet   Oral   Take 1 tablet (150 mg total) by mouth daily.   30 tablet   6   . lamoTRIgine (LAMICTAL) 200 MG tablet   Oral   Take 100 mg by mouth daily. Takes 1/2 tablet daily         . metFORMIN (GLUCOPHAGE)  1000 MG tablet   Oral   Take 1 tablet (1,000 mg total) by mouth daily with breakfast. For high blood sugar control         . Methylsulfonylmethane (MSM PO)   Oral   Take 2 tablets by mouth daily.          . Multiple Vitamin (MULTIVITAMIN WITH MINERALS) TABS   Oral   Take 1 tablet by mouth daily.         . potassium chloride (K-DUR) 10 MEQ tablet   Oral   Take 2 tablets (20 mEq total) by mouth daily as needed (take only on days that you take lasix).   30 tablet   6   . pravastatin (PRAVACHOL) 80 MG tablet   Oral   Take 1 tablet (80 mg total) by mouth daily. For high cholesterol control         . traMADol (ULTRAM) 50 MG tablet   Oral   Take 50 mg by mouth 2 (two) times daily as needed (migraine headache.).          Marland Kitchen vitamin B-12 (CYANOCOBALAMIN) 1000 MCG tablet   Oral   Take 2,000 mcg by mouth daily. For Vitamin B-12 deficiency          BP 171/125  Pulse 95  Temp(Src) 98.5  F (36.9 C) (Oral)  Resp 18  Ht 4\' 9"  (1.448 m)  Wt 175 lb (79.379 kg)  BMI 37.86 kg/m2  SpO2 96%  LMP 01/23/2014 Physical Exam  Nursing note and vitals reviewed. Constitutional: She appears well-developed and well-nourished. No distress.  HENT:  Head: Normocephalic and atraumatic.  Mouth/Throat: Oropharynx is clear and moist. No oropharyngeal exudate.  Eyes: Conjunctivae and EOM are normal. Pupils are equal, round, and reactive to light. Right eye exhibits no discharge. Left eye exhibits no discharge. No scleral icterus.  Neck: Normal range of motion. Neck supple. No JVD present. No thyromegaly present.  Cardiovascular: Normal rate, regular rhythm, normal heart sounds and intact distal pulses.  Exam reveals no gallop and no friction rub.   No murmur heard. Pulmonary/Chest: Effort normal and breath sounds normal. No respiratory distress. She has no wheezes. She has no rales.  Abdominal: Soft. Bowel sounds are normal. She exhibits no distension and no mass. There is no tenderness.  Musculoskeletal: Normal range of motion. She exhibits no edema and no tenderness.  Lymphadenopathy:    She has no cervical adenopathy.  Neurological: She is alert. Coordination normal.  Skin: Skin is warm and dry. No rash noted. No erythema.  Psychiatric: She has a normal mood and affect. Her behavior is normal.    ED Course  Procedures (including critical care time) Labs Review Labs Reviewed  CBC WITH DIFFERENTIAL - Abnormal; Notable for the following:    Hemoglobin 11.3 (*)    MCV 77.6 (*)    MCH 23.0 (*)    MCHC 29.7 (*)    RDW 16.4 (*)    All other components within normal limits  CBG MONITORING, ED - Abnormal; Notable for the following:    Glucose-Capillary 166 (*)    All other components within normal limits  URINALYSIS, ROUTINE W REFLEX MICROSCOPIC  POC URINE PREG, ED  POC URINE PREG, ED   Imaging Review No results found.   EKG Interpretation None      MDM   Final diagnoses:   Abdominal cramping    Vital signs are unremarkable except for hypertension, her abdominal exam is totally benign, nontender, soft without any  masses or guarding or peritoneal signs. The patient is totally asymptomatic, it sounds as though this may be related to her medications being taken with food in her stomach. CBC, urine, pregnancy ordered, patient otherwise stable appearing and declining any pain medications at this time.  Not preg, normal CBC, stable for d/c.  Meds given in ED:  Medications - No data to display  New Prescriptions   No medications on file      Johnna Acosta, MD 02/23/14 [phone]

## 2014-02-23 NOTE — ED Notes (Signed)
CBG taken = 166

## 2014-02-23 NOTE — ED Notes (Signed)
Patient presents stating she took her evening meds (Metformin, Lamictal, Amoxicillin, Depakote) on an empty stomach.  Stated her stomach started cramping.

## 2015-11-14 ENCOUNTER — Emergency Department (HOSPITAL_COMMUNITY)
Admission: EM | Admit: 2015-11-14 | Discharge: 2015-11-14 | Disposition: A | Discharge status: Home / Self Care | Payer: 59 | Attending: Emergency Medicine | Admitting: Emergency Medicine

## 2015-11-14 ENCOUNTER — Encounter (HOSPITAL_COMMUNITY): Payer: Self-pay | Admitting: Emergency Medicine

## 2015-11-14 DIAGNOSIS — E119 Type 2 diabetes mellitus without complications: Secondary | ICD-10-CM | POA: Diagnosis not present

## 2015-11-14 DIAGNOSIS — F319 Bipolar disorder, unspecified: Secondary | ICD-10-CM | POA: Insufficient documentation

## 2015-11-14 DIAGNOSIS — D649 Anemia, unspecified: Secondary | ICD-10-CM | POA: Diagnosis not present

## 2015-11-14 DIAGNOSIS — R1032 Left lower quadrant pain: Secondary | ICD-10-CM | POA: Diagnosis not present

## 2015-11-14 DIAGNOSIS — I10 Essential (primary) hypertension: Secondary | ICD-10-CM | POA: Insufficient documentation

## 2015-11-14 DIAGNOSIS — Z3202 Encounter for pregnancy test, result negative: Secondary | ICD-10-CM | POA: Insufficient documentation

## 2015-11-14 DIAGNOSIS — R197 Diarrhea, unspecified: Secondary | ICD-10-CM | POA: Insufficient documentation

## 2015-11-14 DIAGNOSIS — E785 Hyperlipidemia, unspecified: Secondary | ICD-10-CM | POA: Insufficient documentation

## 2015-11-14 DIAGNOSIS — Z79899 Other long term (current) drug therapy: Secondary | ICD-10-CM | POA: Diagnosis not present

## 2015-11-14 DIAGNOSIS — M79672 Pain in left foot: Secondary | ICD-10-CM

## 2015-11-14 DIAGNOSIS — Z8669 Personal history of other diseases of the nervous system and sense organs: Secondary | ICD-10-CM | POA: Diagnosis not present

## 2015-11-14 HISTORY — DX: Plantar fascial fibromatosis: M72.2

## 2015-11-14 LAB — COMPREHENSIVE METABOLIC PANEL
ALK PHOS: 56 U/L (ref 38–126)
ALT: 14 U/L (ref 14–54)
AST: 16 U/L (ref 15–41)
Albumin: 3.3 g/dL — ABNORMAL LOW (ref 3.5–5.0)
Anion gap: 7 (ref 5–15)
BUN: <5 mg/dL — ABNORMAL LOW (ref 6–20)
CALCIUM: 8.9 mg/dL (ref 8.9–10.3)
CO2: 30 mmol/L (ref 22–32)
CREATININE: 0.59 mg/dL (ref 0.44–1.00)
Chloride: 100 mmol/L — ABNORMAL LOW (ref 101–111)
Glucose, Bld: 120 mg/dL — ABNORMAL HIGH (ref 65–99)
Potassium: 3.9 mmol/L (ref 3.5–5.1)
Sodium: 137 mmol/L (ref 135–145)
Total Bilirubin: 0.4 mg/dL (ref 0.3–1.2)
Total Protein: 6.9 g/dL (ref 6.5–8.1)

## 2015-11-14 LAB — CBC
HCT: 43.5 % (ref 36.0–46.0)
Hemoglobin: 12.8 g/dL (ref 12.0–15.0)
MCH: 26.7 pg (ref 26.0–34.0)
MCHC: 29.4 g/dL — ABNORMAL LOW (ref 30.0–36.0)
MCV: 90.6 fL (ref 78.0–100.0)
PLATELETS: 378 10*3/uL (ref 150–400)
RBC: 4.8 MIL/uL (ref 3.87–5.11)
RDW: 14.8 % (ref 11.5–15.5)
WBC: 9.7 10*3/uL (ref 4.0–10.5)

## 2015-11-14 LAB — POC URINE PREG, ED: PREG TEST UR: NEGATIVE

## 2015-11-14 LAB — URINE MICROSCOPIC-ADD ON
Bacteria, UA: NONE SEEN
RBC / HPF: NONE SEEN RBC/hpf (ref 0–5)
WBC, UA: NONE SEEN WBC/hpf (ref 0–5)

## 2015-11-14 LAB — LIPASE, BLOOD: Lipase: 22 U/L (ref 11–51)

## 2015-11-14 LAB — URINALYSIS, ROUTINE W REFLEX MICROSCOPIC
Bilirubin Urine: NEGATIVE
Glucose, UA: >1000 mg/dL — AB
HGB URINE DIPSTICK: NEGATIVE
Ketones, ur: 15 mg/dL — AB
LEUKOCYTES UA: NEGATIVE
Nitrite: NEGATIVE
PROTEIN: NEGATIVE mg/dL
Specific Gravity, Urine: 1.034 — ABNORMAL HIGH (ref 1.005–1.030)
pH: 7 (ref 5.0–8.0)

## 2015-11-14 MED ORDER — NAPROXEN 500 MG PO TABS: 500.0000 mg | ORAL_TABLET | Freq: Two times a day (BID) | ORAL | Status: AC

## 2015-11-14 MED ORDER — SODIUM CHLORIDE 0.9 % IV BOLUS (SEPSIS)
1000.0000 mL | Freq: Once | INTRAVENOUS | Status: AC
  Administered 2015-11-14: 1000 mL via INTRAVENOUS

## 2015-11-14 MED ORDER — KETOROLAC TROMETHAMINE 30 MG/ML IJ SOLN
30.0000 mg | Freq: Once | INTRAMUSCULAR | Status: AC
  Administered 2015-11-14: 30 mg via INTRAVENOUS
  Filled 2015-11-14: qty 1

## 2015-11-14 MED ORDER — TRAMADOL HCL 50 MG PO TABS: 50.0000 mg | ORAL_TABLET | Freq: Two times a day (BID) | ORAL | Status: DC | PRN

## 2015-11-14 NOTE — Discharge Instructions (Signed)
As discussed, your evaluation today has been largely reassuring.  But, it is important that you monitor your condition carefully, and do not hesitate to return to the ED if you develop new, or concerning changes in your condition. ? ?Otherwise, please follow-up with your physician for appropriate ongoing care. ? ?

## 2015-11-14 NOTE — ED Provider Notes (Signed)
CSN: [id number]     Arrival date & time 11/14/15  [id number] History   First MD Initiated Contact with Patient 11/14/15 0701     Chief Complaint  Patient presents with  . Abdominal Pain  . Foot Pain     (Consider location/radiation/quality/duration/timing/severity/associated sxs/prior Treatment) HPI Impression presents with 2 chief complaints. Chief complaint #1, foot pain. Patient acknowledges a history of plantar fasciitis, left foot. Over the past 5 hours, the patient has developed pain throughout the plantar aspect of the foot, as well as the dorsal lateral surface. Patient was walking at work, when she developed the pain. The pain is worse with ambulation, minimally better with rest. Animal improvement with ibuprofen. No loss of sensation or function.  Chief complaint #2, abdominal pain. Over the past week, the patient gradually developed persistent pain in the left lower quadrant. The pain is nonradiating, moderate, sore, crampy. There is associated loose stool, but no anorexia, no vomiting. No fever, chills. Minimal relief with OTC medication, including milk of magnesia.  Past Medical History  Diagnosis Date  . Hypertension   . Diabetes mellitus without complication ([place])   . Hyperlipidemia 03/31/2013  . Bipolar disorder ([place]) 03/31/2013  . Diabetes ([place]) 03/31/2013  . Vitamin D deficiency 03/31/2013  . Anemia microcytic 03/31/2013  . Rhabdomyolysis 03/31/2013    [place] secondary to abilify  . OSA (obstructive sleep apnea)   . Plantar fasciitis of left foot    Past Surgical History  Procedure Laterality Date  . Ventriculoperitoneal shunt    . Cesarean section    . Cyst excision     History reviewed. No pertinent family history. Social History  Substance Use Topics  . Smoking status: Never Smoker   . Smokeless tobacco: Never Used  . Alcohol Use: No   OB History    No data available     Review of Systems  Constitutional:       Per HPI, otherwise negative  HENT:         Per HPI, otherwise negative  Respiratory:       Per HPI, otherwise negative  Cardiovascular:       Per HPI, otherwise negative  Gastrointestinal: Positive for diarrhea. Negative for vomiting.  Endocrine:       Negative aside from HPI  Genitourinary:       Neg aside from HPI   Musculoskeletal:       Per HPI, otherwise negative  Skin: Negative.   Neurological: Negative for syncope.      Allergies  Abilify  Home Medications   Prior to Admission medications   Medication Sig Start Date End Date Taking? Authorizing Provider  amLODipine (NORVASC) 5 MG tablet Take 5 mg by mouth daily.   Yes Historical Provider, MD  Canagliflozin-Metformin HCl (INVOKAMET) 50-1000 MG TABS Take 1 tablet by mouth 2 (two) times daily.   Yes Historical Provider, MD  divalproex (DEPAKOTE ER) 500 MG 24 hr tablet Take 500 mg by mouth daily.    Yes Historical Provider, MD  ferrous sulfate 325 (65 FE) MG tablet Take 1 tablet (325 mg total) by mouth 2 (two) times daily with a meal. Patient taking differently: Take 325 mg by mouth daily.  04/08/13  Yes Erick Colace, NP  furosemide (LASIX) 40 MG tablet Take 1 tablet (40 mg total) by mouth daily as needed (for weight gain of > 3 lbs in 24 hr w/ lower ext swelling). 04/08/13  Yes Erick Colace, NP  irbesartan (AVAPRO) 150 MG tablet  Take 1 tablet (150 mg total) by mouth daily. 04/08/13  Yes Erick Colace, NP  lamoTRIgine (LAMICTAL) 200 MG tablet Take 100 mg by mouth daily. Takes 1/2 tablet daily   Yes Historical Provider, MD  Methylsulfonylmethane (MSM PO) Take 2 tablets by mouth daily.    Yes Historical Provider, MD  potassium chloride (K-DUR) 10 MEQ tablet Take 2 tablets (20 mEq total) by mouth daily as needed (take only on days that you take lasix). 04/08/13  Yes Erick Colace, NP  pravastatin (PRAVACHOL) 80 MG tablet Take 1 tablet (80 mg total) by mouth daily. For high cholesterol control 03/21/13  Yes Encarnacion Slates, NP  traMADol (ULTRAM) 50 MG tablet Take  50 mg by mouth 2 (two) times daily as needed (migraine headache.).    Yes Historical Provider, MD   BP 121/111 mmHg  Pulse 84  Temp(Src) 98.1 F (36.7 C) (Oral)  Resp 16  SpO2 100%  LMP 11/03/2015 Physical Exam  Constitutional: She is oriented to person, place, and time. She appears well-developed and well-nourished. No distress.  HENT:  Head: Normocephalic and atraumatic.  Eyes: Conjunctivae and EOM are normal.  Cardiovascular: Normal rate and regular rhythm.   Pulmonary/Chest: Effort normal and breath sounds normal. No stridor. No respiratory distress.  Abdominal: She exhibits no distension. There is tenderness in the left lower quadrant. There is no rigidity, no rebound and no guarding.  Musculoskeletal: She exhibits no edema.       Left ankle: Normal.       Feet:  Neurological: She is alert and oriented to person, place, and time. No cranial nerve deficit.  Skin: Skin is warm and dry.  Psychiatric: She has a normal mood and affect.  Nursing note and vitals reviewed.   ED Course  Procedures (including critical care time) Labs Review Labs Reviewed  COMPREHENSIVE METABOLIC PANEL - Abnormal; Notable for the following:    Chloride 100 (*)    Glucose, Bld 120 (*)    BUN <5 (*)    Albumin 3.3 (*)    All other components within normal limits  CBC - Abnormal; Notable for the following:    MCHC 29.4 (*)    All other components within normal limits  URINALYSIS, ROUTINE W REFLEX MICROSCOPIC (NOT AT [facility]) - Abnormal; Notable for the following:    Specific Gravity, Urine 1.034 (*)    Glucose, UA >1000 (*)    Ketones, ur 15 (*)    All other components within normal limits  URINE MICROSCOPIC-ADD ON - Abnormal; Notable for the following:    Squamous Epithelial / LPF 0-5 (*)    All other components within normal limits  LIPASE, BLOOD  POC URINE PREG, ED    Imaging Review No results found. I have personally reviewed and evaluated these images and lab results as part of my  medical decision-making.  After my initial evaluation the patient received analgesia, ice.  9:25 AM On repeat exam the patient is in no distress. We discussed all findings, including reassuring labs, though there is evidence for ongoing mild hyperglycemia, glucosuria. We discussed the need for ongoing appropriate fluid resuscitation, as well as orthopedic therapy  MDM  Patient presents with primary concern of new left foot pain, but also with some concern for ongoing left abdominal pain. Here, the patient is awake, alert, afebrile, with soft, non-peritoneal abdomen. No evidence for obstructed or infected kidney stone. There is some evidence for dehydration, and hyperglycemia, but no angina anion gap, no evidence  for decompensated state. Patient will follow-up with primary care for further evaluation of this. In addition, the patient's left foot pain is likely soft tissue inflammation, with no evidence for fracture, no history of fall, trauma. Patient received analgesia, will continue cryotherapy, analgesia at home, with orthopedic follow-up.  Carmin Muskrat, MD 11/14/15 [phone]

## 2015-11-14 NOTE — ED Notes (Signed)
Pt arrived by POV, states she has had stomach pain for about 1 week.  Pain is in lower left quad, sometimes radiating to the mid abdomen. Pt has taken milk of mag and dulcolax and had many bowel movements, but it has not relieved the pain. Pt started a clear liquid diet yesterday. Pt is also having pain in her left foot. 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 Has not been having pain, but it started hurting so bad last night that she had to leave work.

## 2015-11-14 NOTE — ED Notes (Signed)
Pt placed in a gown and hooked up to the monitor with the BP cuff and pulse ox 

## 2016-02-07 ENCOUNTER — Other Ambulatory Visit: Payer: Self-pay | Admitting: Obstetrics

## 2016-02-11 ENCOUNTER — Inpatient Hospital Stay (HOSPITAL_COMMUNITY)
Admission: RE | Admit: 2016-02-11 | Discharge: 2016-02-11 | Disposition: A | Discharge status: Home / Self Care | Payer: Self-pay | Source: Ambulatory Visit

## 2016-02-11 NOTE — Patient Instructions (Addendum)
Your procedure is scheduled on:  Thursday, February 17, 2016  Enter through the [employer] of [facility] at:  9:30 AM  Pick up the phone at the desk and dial [phone].  Call this number if you have problems the morning of surgery: [phone].  Remember:  Do NOT eat food or drink after: Midnight Wednesday  Take these medicines the morning of surgery with a SIP OF WATER: Norvasc,  Depakote, Irbesartan, Lamotrigine, Potassium, Pravastatin  DO NOT TAKE METFORMIN 24 HOURS PRIOR TO SURGERY  Do NOT wear jewelry (body piercing), metal hair clips/bobby pins, make-up, or nail polish. Do NOT wear lotions, powders, or perfumes.  You may wear deoderant. Do NOT shave for 48 hours prior to surgery. Do NOT bring valuables to the hospital. Contacts, dentures, or bridgework may not be worn into surgery.  Have a responsible adult drive you home and stay with you for 24 hours after your procedure

## 2016-02-15 ENCOUNTER — Encounter (HOSPITAL_COMMUNITY): Payer: Self-pay

## 2016-02-15 ENCOUNTER — Encounter (HOSPITAL_COMMUNITY)
Admission: RE | Admit: 2016-02-15 | Discharge: 2016-02-15 | Disposition: A | Discharge status: Home / Self Care | Payer: Federal, State, Local not specified - PPO | Source: Ambulatory Visit | Attending: Obstetrics | Admitting: Obstetrics

## 2016-02-15 ENCOUNTER — Other Ambulatory Visit: Payer: Self-pay

## 2016-02-15 HISTORY — DX: Pneumonia, unspecified organism: J18.9

## 2016-02-15 LAB — BASIC METABOLIC PANEL
Anion gap: 8 (ref 5–15)
BUN: 18 mg/dL (ref 6–20)
CHLORIDE: 102 mmol/L (ref 101–111)
CO2: 28 mmol/L (ref 22–32)
CREATININE: 0.67 mg/dL (ref 0.44–1.00)
Calcium: 8.8 mg/dL — ABNORMAL LOW (ref 8.9–10.3)
GFR calc Af Amer: >60 mL/min (ref >60)
GFR calc non Af Amer: >60 mL/min (ref >60)
GLUCOSE: 162 mg/dL — AB (ref 65–99)
Potassium: 3.7 mmol/L (ref 3.5–5.1)
Sodium: 138 mmol/L (ref 135–145)

## 2016-02-15 LAB — CBC
HCT: 39.6 % (ref 36.0–46.0)
Hemoglobin: 12.2 g/dL (ref 12.0–15.0)
MCH: 26 pg (ref 26.0–34.0)
MCHC: 30.8 g/dL (ref 30.0–36.0)
MCV: 84.4 fL (ref 78.0–100.0)
Platelets: 384 10*3/uL (ref 150–400)
RBC: 4.69 MIL/uL (ref 3.87–5.11)
RDW: 15.2 % (ref 11.5–15.5)
WBC: 9.2 10*3/uL (ref 4.0–10.5)

## 2016-02-15 LAB — TYPE AND SCREEN
ABO/RH(D): O POS
ANTIBODY SCREEN: NEGATIVE

## 2016-02-15 LAB — ABO/RH: ABO/RH(D): O POS

## 2016-02-15 NOTE — Patient Instructions (Addendum)
   Your procedure is scheduled on:  March 2 (Thursday)  Enter through the [employer] of [facility] at: [place] up the phone at the desk and dial [phone] and inform us of your arrival.  Please call this number if you have any problems the morning of surgery: [phone]  Remember: Do not eat food after midnight: march 1  Do not drink clear liquids after: 7am day of surgery  Take these medicines the morning of surgery with a SIP OF WATER: TAKE BLOOD PRESSURE MEDS , LAMATICAL AND DEPAKOTE DAY OF SURGERY  Do not wear jewelry, make-up, or FINGER nail polish No metal in your hair or on your body. Do not wear lotions, powders, perfumes.  You may wear deodorant.  Do not bring valuables to the hospital. Contacts, dentures or bridgework may not be worn into surgery.    Patients discharged on the day of surgery will not be allowed to drive home.

## 2016-02-16 MED ORDER — DOXYCYCLINE HYCLATE 100 MG IV SOLR
100.0000 mg | Freq: Once | INTRAVENOUS | Status: AC
  Administered 2016-02-17: 100 mg via INTRAVENOUS
  Filled 2016-02-16: qty 100

## 2016-02-17 ENCOUNTER — Ambulatory Visit (HOSPITAL_COMMUNITY): Payer: Federal, State, Local not specified - PPO

## 2016-02-17 ENCOUNTER — Ambulatory Visit (HOSPITAL_COMMUNITY): Payer: Federal, State, Local not specified - PPO | Admitting: Anesthesiology

## 2016-02-17 ENCOUNTER — Encounter (HOSPITAL_COMMUNITY): Payer: Self-pay | Admitting: *Deleted

## 2016-02-17 ENCOUNTER — Inpatient Hospital Stay (HOSPITAL_COMMUNITY)
Admission: AD | Admit: 2016-02-17 | Discharge: 2016-02-23 | DRG: 742 | Disposition: A | Discharge status: Home / Self Care | Payer: Federal, State, Local not specified - PPO | Source: Ambulatory Visit | Attending: Internal Medicine | Admitting: Internal Medicine

## 2016-02-17 ENCOUNTER — Encounter (HOSPITAL_COMMUNITY)
Admission: AD | Disposition: A | Discharge status: Home / Self Care | Payer: Self-pay | Source: Ambulatory Visit | Attending: Internal Medicine

## 2016-02-17 DIAGNOSIS — E559 Vitamin D deficiency, unspecified: Secondary | ICD-10-CM | POA: Diagnosis present

## 2016-02-17 DIAGNOSIS — E662 Morbid (severe) obesity with alveolar hypoventilation: Secondary | ICD-10-CM | POA: Diagnosis present

## 2016-02-17 DIAGNOSIS — N920 Excessive and frequent menstruation with regular cycle: Secondary | ICD-10-CM | POA: Diagnosis present

## 2016-02-17 DIAGNOSIS — Z6838 Body mass index (BMI) 38.0-38.9, adult: Secondary | ICD-10-CM | POA: Diagnosis not present

## 2016-02-17 DIAGNOSIS — I509 Heart failure, unspecified: Secondary | ICD-10-CM

## 2016-02-17 DIAGNOSIS — D72829 Elevated white blood cell count, unspecified: Secondary | ICD-10-CM | POA: Diagnosis not present

## 2016-02-17 DIAGNOSIS — J9601 Acute respiratory failure with hypoxia: Secondary | ICD-10-CM | POA: Diagnosis not present

## 2016-02-17 DIAGNOSIS — J939 Pneumothorax, unspecified: Secondary | ICD-10-CM | POA: Diagnosis not present

## 2016-02-17 DIAGNOSIS — I5032 Chronic diastolic (congestive) heart failure: Secondary | ICD-10-CM | POA: Diagnosis not present

## 2016-02-17 DIAGNOSIS — J9621 Acute and chronic respiratory failure with hypoxia: Secondary | ICD-10-CM | POA: Diagnosis not present

## 2016-02-17 DIAGNOSIS — F319 Bipolar disorder, unspecified: Secondary | ICD-10-CM | POA: Insufficient documentation

## 2016-02-17 DIAGNOSIS — N390 Urinary tract infection, site not specified: Secondary | ICD-10-CM | POA: Diagnosis not present

## 2016-02-17 DIAGNOSIS — D509 Iron deficiency anemia, unspecified: Secondary | ICD-10-CM | POA: Diagnosis present

## 2016-02-17 DIAGNOSIS — Z982 Presence of cerebrospinal fluid drainage device: Secondary | ICD-10-CM

## 2016-02-17 DIAGNOSIS — J810 Acute pulmonary edema: Secondary | ICD-10-CM

## 2016-02-17 DIAGNOSIS — S270XXD Traumatic pneumothorax, subsequent encounter: Secondary | ICD-10-CM | POA: Diagnosis not present

## 2016-02-17 DIAGNOSIS — R0600 Dyspnea, unspecified: Secondary | ICD-10-CM | POA: Diagnosis not present

## 2016-02-17 DIAGNOSIS — D250 Submucous leiomyoma of uterus: Principal | ICD-10-CM | POA: Diagnosis present

## 2016-02-17 DIAGNOSIS — J9611 Chronic respiratory failure with hypoxia: Secondary | ICD-10-CM | POA: Diagnosis not present

## 2016-02-17 DIAGNOSIS — R0902 Hypoxemia: Secondary | ICD-10-CM

## 2016-02-17 DIAGNOSIS — R918 Other nonspecific abnormal finding of lung field: Secondary | ICD-10-CM

## 2016-02-17 DIAGNOSIS — Z79899 Other long term (current) drug therapy: Secondary | ICD-10-CM | POA: Diagnosis not present

## 2016-02-17 DIAGNOSIS — I5033 Acute on chronic diastolic (congestive) heart failure: Secondary | ICD-10-CM | POA: Diagnosis not present

## 2016-02-17 DIAGNOSIS — E785 Hyperlipidemia, unspecified: Secondary | ICD-10-CM | POA: Diagnosis present

## 2016-02-17 DIAGNOSIS — S270XXA Traumatic pneumothorax, initial encounter: Secondary | ICD-10-CM | POA: Diagnosis not present

## 2016-02-17 DIAGNOSIS — J9600 Acute respiratory failure, unspecified whether with hypoxia or hypercapnia: Secondary | ICD-10-CM | POA: Diagnosis present

## 2016-02-17 DIAGNOSIS — I10 Essential (primary) hypertension: Secondary | ICD-10-CM | POA: Insufficient documentation

## 2016-02-17 DIAGNOSIS — E118 Type 2 diabetes mellitus with unspecified complications: Secondary | ICD-10-CM | POA: Diagnosis present

## 2016-02-17 DIAGNOSIS — J9383 Other pneumothorax: Secondary | ICD-10-CM | POA: Diagnosis not present

## 2016-02-17 DIAGNOSIS — Z7984 Long term (current) use of oral hypoglycemic drugs: Secondary | ICD-10-CM

## 2016-02-17 DIAGNOSIS — Z9689 Presence of other specified functional implants: Secondary | ICD-10-CM

## 2016-02-17 DIAGNOSIS — D259 Leiomyoma of uterus, unspecified: Secondary | ICD-10-CM | POA: Insufficient documentation

## 2016-02-17 HISTORY — PX: DILATATION & CURETTAGE/HYSTEROSCOPY WITH MYOSURE: SHX6511

## 2016-02-17 HISTORY — PX: MYOMECTOMY: SHX85

## 2016-02-17 LAB — BRAIN NATRIURETIC PEPTIDE: B Natriuretic Peptide: 14.3 pg/mL (ref 0.0–100.0)

## 2016-02-17 LAB — POCT I-STAT 3, ART BLOOD GAS (G3+)
Acid-Base Excess: 1 mmol/L (ref 0.0–2.0)
Bicarbonate: 29.1 mEq/L — ABNORMAL HIGH (ref 20.0–24.0)
O2 SAT: 90 %
PCO2 ART: 60.6 mmHg — AB (ref 35.0–45.0)
PH ART: 7.29 — AB (ref 7.350–7.450)
PO2 ART: 67 mmHg — AB (ref 80.0–100.0)
TCO2: 31 mmol/L (ref 0–100)

## 2016-02-17 LAB — MRSA PCR SCREENING: MRSA by PCR: NEGATIVE

## 2016-02-17 LAB — GLUCOSE, CAPILLARY
GLUCOSE-CAPILLARY: 150 mg/dL — AB (ref 65–99)
GLUCOSE-CAPILLARY: 128 mg/dL — AB (ref 65–99)
Glucose-Capillary: 87 mg/dL (ref 65–99)
Glucose-Capillary: 147 mg/dL — ABNORMAL HIGH (ref 65–99)

## 2016-02-17 LAB — PREGNANCY, URINE: PREG TEST UR: NEGATIVE

## 2016-02-17 LAB — VALPROIC ACID LEVEL

## 2016-02-17 SURGERY — DILATATION & CURETTAGE/HYSTEROSCOPY WITH MYOSURE
Anesthesia: General | Site: Vagina

## 2016-02-17 MED ORDER — FLUMAZENIL 0.5 MG/5ML IV SOLN
INTRAVENOUS | Status: DC | PRN
  Administered 2016-02-17 (×2): 0.2 mg via INTRAVENOUS

## 2016-02-17 MED ORDER — PHENYLEPHRINE 40 MCG/ML (10ML) SYRINGE FOR IV PUSH (FOR BLOOD PRESSURE SUPPORT)
PREFILLED_SYRINGE | INTRAVENOUS | Status: AC
  Filled 2016-02-17: qty 10

## 2016-02-17 MED ORDER — OXYCODONE-ACETAMINOPHEN 5-325 MG PO TABS: 1.0000 | ORAL_TABLET | ORAL | Status: DC | PRN

## 2016-02-17 MED ORDER — MIDAZOLAM HCL 2 MG/2ML IJ SOLN
1.0000 mg | Freq: Once | INTRAMUSCULAR | Status: AC
  Administered 2016-02-17: 1 mg via INTRAVENOUS

## 2016-02-17 MED ORDER — PROPOFOL 10 MG/ML IV BOLUS
INTRAVENOUS | Status: AC
  Filled 2016-02-17: qty 20

## 2016-02-17 MED ORDER — FENTANYL CITRATE (PF) 100 MCG/2ML IJ SOLN
INTRAMUSCULAR | Status: DC | PRN
  Administered 2016-02-17 (×2): 50 ug via INTRAVENOUS

## 2016-02-17 MED ORDER — FUROSEMIDE 10 MG/ML IJ SOLN
40.0000 mg | Freq: Two times a day (BID) | INTRAMUSCULAR | Status: AC
  Administered 2016-02-17 – 2016-02-18 (×2): 40 mg via INTRAVENOUS
  Filled 2016-02-17 (×2): qty 4

## 2016-02-17 MED ORDER — LAMOTRIGINE 100 MG PO TABS
200.0000 mg | ORAL_TABLET | Freq: Every day | ORAL | Status: DC
  Filled 2016-02-17: qty 1

## 2016-02-17 MED ORDER — EPHEDRINE SULFATE 50 MG/ML IJ SOLN
INTRAMUSCULAR | Status: DC | PRN
  Administered 2016-02-17 (×2): 5 mg via INTRAVENOUS
  Administered 2016-02-17: 1 mg via INTRAVENOUS
  Administered 2016-02-17: 5 mg via INTRAVENOUS

## 2016-02-17 MED ORDER — DEXAMETHASONE SODIUM PHOSPHATE 10 MG/ML IJ SOLN
INTRAMUSCULAR | Status: DC | PRN
  Administered 2016-02-17: 4 mg via INTRAVENOUS

## 2016-02-17 MED ORDER — DIVALPROEX SODIUM ER 500 MG PO TB24
500.0000 mg | ORAL_TABLET | Freq: Every day | ORAL | Status: DC
  Administered 2016-02-18: 500 mg via ORAL
  Filled 2016-02-17 (×4): qty 1

## 2016-02-17 MED ORDER — KETOROLAC TROMETHAMINE 30 MG/ML IJ SOLN
INTRAMUSCULAR | Status: AC
  Filled 2016-02-17: qty 1

## 2016-02-17 MED ORDER — EPHEDRINE 5 MG/ML INJ
INTRAVENOUS | Status: AC
  Filled 2016-02-17: qty 10

## 2016-02-17 MED ORDER — ONDANSETRON HCL 4 MG/2ML IJ SOLN
INTRAMUSCULAR | Status: DC | PRN
  Administered 2016-02-17: 4 mg via INTRAVENOUS

## 2016-02-17 MED ORDER — ALBUTEROL SULFATE HFA 108 (90 BASE) MCG/ACT IN AERS
INHALATION_SPRAY | RESPIRATORY_TRACT | Status: DC | PRN
  Administered 2016-02-17: 2 via RESPIRATORY_TRACT

## 2016-02-17 MED ORDER — INSULIN ASPART 100 UNIT/ML ~~LOC~~ SOLN
2.0000 [IU] | SUBCUTANEOUS | Status: DC
  Administered 2016-02-17: 4 [IU] via SUBCUTANEOUS
  Administered 2016-02-17 – 2016-02-18 (×3): 2 [IU] via SUBCUTANEOUS

## 2016-02-17 MED ORDER — MIDAZOLAM HCL 2 MG/2ML IJ SOLN
INTRAMUSCULAR | Status: DC | PRN
  Administered 2016-02-17 (×2): 1 mg via INTRAVENOUS

## 2016-02-17 MED ORDER — FENTANYL CITRATE (PF) 100 MCG/2ML IJ SOLN: 25.0000 ug | INTRAMUSCULAR | Status: DC | PRN

## 2016-02-17 MED ORDER — BUPIVACAINE HCL (PF) 0.25 % IJ SOLN
INTRAMUSCULAR | Status: DC | PRN
  Administered 2016-02-17: 20 mL

## 2016-02-17 MED ORDER — PHENYLEPHRINE HCL 10 MG/ML IJ SOLN
INTRAMUSCULAR | Status: DC | PRN
  Administered 2016-02-17: 80 ug via INTRAVENOUS
  Administered 2016-02-17: 120 ug via INTRAVENOUS
  Administered 2016-02-17: 80 ug via INTRAVENOUS
  Administered 2016-02-17: 40 ug via INTRAVENOUS

## 2016-02-17 MED ORDER — OXYCODONE HCL 5 MG PO TABS: 5.0000 mg | ORAL_TABLET | Freq: Once | ORAL | Status: DC | PRN

## 2016-02-17 MED ORDER — FENTANYL CITRATE (PF) 100 MCG/2ML IJ SOLN
25.0000 ug | Freq: Once | INTRAMUSCULAR | Status: AC
  Administered 2016-02-17: 25 ug via INTRAVENOUS

## 2016-02-17 MED ORDER — ALBUTEROL SULFATE HFA 108 (90 BASE) MCG/ACT IN AERS
INHALATION_SPRAY | RESPIRATORY_TRACT | Status: AC
  Filled 2016-02-17: qty 6.7

## 2016-02-17 MED ORDER — LIDOCAINE HCL (CARDIAC) 20 MG/ML IV SOLN
INTRAVENOUS | Status: DC | PRN
  Administered 2016-02-17: 100 mg via INTRAVENOUS

## 2016-02-17 MED ORDER — SODIUM CHLORIDE 0.9 % IR SOLN
Status: DC | PRN
  Administered 2016-02-17: 3000 mL

## 2016-02-17 MED ORDER — OXYCODONE HCL 5 MG/5ML PO SOLN: 5.0000 mg | Freq: Once | ORAL | Status: DC | PRN

## 2016-02-17 MED ORDER — SUCCINYLCHOLINE CHLORIDE 20 MG/ML IJ SOLN
INTRAMUSCULAR | Status: AC
  Filled 2016-02-17: qty 1

## 2016-02-17 MED ORDER — LIDOCAINE HCL 1 % IJ SOLN
INTRAMUSCULAR | Status: AC
  Filled 2016-02-17: qty 20

## 2016-02-17 MED ORDER — FUROSEMIDE 10 MG/ML IJ SOLN
INTRAMUSCULAR | Status: AC
  Filled 2016-02-17: qty 2

## 2016-02-17 MED ORDER — BUPIVACAINE HCL (PF) 0.25 % IJ SOLN
INTRAMUSCULAR | Status: AC
  Filled 2016-02-17: qty 30

## 2016-02-17 MED ORDER — FENTANYL CITRATE (PF) 100 MCG/2ML IJ SOLN
INTRAMUSCULAR | Status: AC
  Filled 2016-02-17: qty 2

## 2016-02-17 MED ORDER — LIDOCAINE HCL (CARDIAC) 20 MG/ML IV SOLN
INTRAVENOUS | Status: AC
  Filled 2016-02-17: qty 5

## 2016-02-17 MED ORDER — MIDAZOLAM HCL 2 MG/2ML IJ SOLN
INTRAMUSCULAR | Status: AC
  Filled 2016-02-17: qty 2

## 2016-02-17 MED ORDER — SCOPOLAMINE 1 MG/3DAYS TD PT72
MEDICATED_PATCH | TRANSDERMAL | Status: AC
  Administered 2016-02-17: 1.5 mg via TRANSDERMAL
  Filled 2016-02-17: qty 1

## 2016-02-17 MED ORDER — ONDANSETRON HCL 4 MG/2ML IJ SOLN
INTRAMUSCULAR | Status: AC
  Filled 2016-02-17: qty 2

## 2016-02-17 MED ORDER — FUROSEMIDE 10 MG/ML IJ SOLN
INTRAMUSCULAR | Status: DC | PRN
  Administered 2016-02-17 (×2): 20 mg via INTRAMUSCULAR

## 2016-02-17 MED ORDER — ACETAMINOPHEN 325 MG PO TABS: 325.0000 mg | ORAL_TABLET | ORAL | Status: DC | PRN

## 2016-02-17 MED ORDER — ACETAMINOPHEN 160 MG/5ML PO SOLN: 325.0000 mg | ORAL | Status: DC | PRN

## 2016-02-17 MED ORDER — LACTATED RINGERS IV SOLN
INTRAVENOUS | Status: DC
  Administered 2016-02-17 – 2016-02-18 (×3): via INTRAVENOUS

## 2016-02-17 MED ORDER — FLUMAZENIL 0.5 MG/5ML IV SOLN
INTRAVENOUS | Status: AC
  Filled 2016-02-17: qty 5

## 2016-02-17 MED ORDER — PROPOFOL 10 MG/ML IV BOLUS
INTRAVENOUS | Status: DC | PRN
  Administered 2016-02-17: 100 mg via INTRAVENOUS
  Administered 2016-02-17: 50 mg via INTRAVENOUS
  Administered 2016-02-17: 120 mg via INTRAVENOUS
  Administered 2016-02-17: 50 mg via INTRAVENOUS
  Administered 2016-02-17: 80 mg via INTRAVENOUS

## 2016-02-17 MED ORDER — OXYCODONE-ACETAMINOPHEN 5-325 MG PO TABS
1.0000 | ORAL_TABLET | Freq: Four times a day (QID) | ORAL | Status: DC | PRN
  Administered 2016-02-17: 1 via ORAL
  Filled 2016-02-17: qty 1

## 2016-02-17 MED ORDER — SUCCINYLCHOLINE CHLORIDE 20 MG/ML IJ SOLN
INTRAMUSCULAR | Status: DC | PRN
  Administered 2016-02-17: 140 mg via INTRAVENOUS

## 2016-02-17 MED ORDER — SCOPOLAMINE 1 MG/3DAYS TD PT72
1.0000 | MEDICATED_PATCH | Freq: Once | TRANSDERMAL | Status: DC
  Administered 2016-02-17: 1.5 mg via TRANSDERMAL

## 2016-02-17 MED ORDER — DEXAMETHASONE SODIUM PHOSPHATE 4 MG/ML IJ SOLN
INTRAMUSCULAR | Status: AC
  Filled 2016-02-17: qty 1

## 2016-02-17 SURGICAL SUPPLY — 44 items
CANISTER SUCT 3000ML (MISCELLANEOUS) ×4 IMPLANT
CATH ROBINSON RED A/P 16FR (CATHETERS) ×2 IMPLANT
CHLORAPREP W/TINT 26ML (MISCELLANEOUS) IMPLANT
CLOTH BEACON ORANGE TIMEOUT ST (SAFETY) ×2 IMPLANT
CONT PATH 16OZ SNAP LID 3702 (MISCELLANEOUS) IMPLANT
CONTAINER PREFILL 10% NBF 60ML (FORM) ×2 IMPLANT
DECANTER SPIKE VIAL GLASS SM (MISCELLANEOUS) IMPLANT
DEVICE MYOSURE LITE (MISCELLANEOUS) IMPLANT
DEVICE MYOSURE REACH (MISCELLANEOUS) ×2 IMPLANT
ELECT NEEDLE TIP 2.8 STRL (NEEDLE) IMPLANT
FILTER ARTHROSCOPY CONVERTOR (FILTER) IMPLANT
GAUZE SPONGE 4X4 12PLY STRL (GAUZE/BANDAGES/DRESSINGS) IMPLANT
GAUZE SPONGE 4X4 16PLY XRAY LF (GAUZE/BANDAGES/DRESSINGS) IMPLANT
GLOVE BIO SURGEON STRL SZ 6.5 (GLOVE) ×4 IMPLANT
GLOVE BIOGEL PI IND STRL 7.0 (GLOVE) ×2 IMPLANT
GLOVE BIOGEL PI INDICATOR 7.0 (GLOVE) ×2
GOWN STRL REUS W/TWL LRG LVL3 (GOWN DISPOSABLE) ×6 IMPLANT
LIQUID BAND (GAUZE/BANDAGES/DRESSINGS) IMPLANT
NEEDLE HYPO 22GX1.5 SAFETY (NEEDLE) IMPLANT
NS IRRIG 1000ML POUR BTL (IV SOLUTION) IMPLANT
PACK ABDOMINAL GYN (CUSTOM PROCEDURE TRAY) IMPLANT
PACK VAGINAL MINOR WOMEN LF (CUSTOM PROCEDURE TRAY) ×2 IMPLANT
PAD ABD 7.5X8 STRL (GAUZE/BANDAGES/DRESSINGS) IMPLANT
PAD OB MATERNITY 4.3X12.25 (PERSONAL CARE ITEMS) ×2 IMPLANT
PENCIL SMOKE EVAC W/HOLSTER (ELECTROSURGICAL) IMPLANT
SEAL ROD LENS SCOPE MYOSURE (ABLATOR) ×2 IMPLANT
SPONGE LAP 18X18 X RAY DECT (DISPOSABLE) IMPLANT
STAPLER VISISTAT 35W (STAPLE) IMPLANT
STENT BALLN UTERINE 4CM 6FR (STENTS) IMPLANT
SUT MON AB 4-0 PS1 27 (SUTURE) IMPLANT
SUT PLAIN 2 0 XLH (SUTURE) IMPLANT
SUT VIC AB 0 CT1 27 (SUTURE) ×6
SUT VIC AB 0 CT1 27XBRD ANBCTR (SUTURE) ×6 IMPLANT
SUT VIC AB 2-0 CT1 27 (SUTURE)
SUT VIC AB 2-0 CT1 TAPERPNT 27 (SUTURE) IMPLANT
SUT VICRYL #0 CTB 1 (SUTURE) IMPLANT
SUT VICRYL 1 TIES 12X18 (SUTURE) IMPLANT
SYR CONTROL 10ML LL (SYRINGE) IMPLANT
SYR TB 1ML 25GX5/8 (SYRINGE) IMPLANT
TOWEL OR 17X24 6PK STRL BLUE (TOWEL DISPOSABLE) ×4 IMPLANT
TRAY FOLEY CATH SILVER 14FR (SET/KITS/TRAYS/PACK) IMPLANT
TUBING AQUILEX INFLOW (TUBING) ×4 IMPLANT
TUBING AQUILEX OUTFLOW (TUBING) ×2 IMPLANT
WATER STERILE IRR 1000ML POUR (IV SOLUTION) ×2 IMPLANT

## 2016-02-17 NOTE — Progress Notes (Signed)
Called report to [place], Therapist, sports at [facility]

## 2016-02-17 NOTE — Progress Notes (Signed)
Brief Op note  40F R chest tube placed for spontaneous 100% pneumothorax during GYN procedure for fibroids under gen anesthesia  with oral- tracheal intubation    I eaxamined the patient and reviewed the CXR She was in respiratory difficulty on 100% O2 with Bipap so informed consent was not possible  The chest tube was placed with local 1% lidocaine and iv monitored conscious sedation. Placed to 20 cm suction. F/u CXR shows no pntx but with re-expansion R pulmonary edema. Sats up to 94-95% on mask O2  Will follow patient for chest tube management after she is transferred to [place] Gum, MD [facility]

## 2016-02-17 NOTE — Anesthesia Preprocedure Evaluation (Addendum)
Anesthesia Evaluation  Patient identified by MRN, date of birth, ID band Patient awake    Reviewed: Allergy & Precautions, NPO status , Patient's Chart, lab work & pertinent test results  History of Anesthesia Complications Negative for: history of anesthetic complications  Airway Mallampati: IV  TM Distance: <3 FB Neck ROM: Full    Dental  (+) Teeth Intact   Pulmonary shortness of breath and with exertion, sleep apnea , neg COPD,    breath sounds clear to auscultation       Cardiovascular hypertension, Pt. on medications (-) angina+CHF  (-) Past MI  Rhythm:Regular     Neuro/Psych Bipolar Disorder VP shunt since childhood, no neuro symptoms recently   Neuromuscular disease    GI/Hepatic Neg liver ROS,   Endo/Other  diabetes, Type 2, Oral Hypoglycemic AgentsMorbid obesity  Renal/GU negative Renal ROS     Musculoskeletal   Abdominal   Peds  Hematology  (+) anemia ,   Anesthesia Other Findings   Reproductive/Obstetrics                            Anesthesia Physical Anesthesia Plan  ASA: III  Anesthesia Plan: General   Post-op Pain Management:    Induction: Intravenous  Airway Management Planned: LMA  Additional Equipment: None  Intra-op Plan:   Post-operative Plan: Extubation in OR  Informed Consent: I have reviewed the patients History and Physical, chart, labs and discussed the procedure including the risks, benefits and alternatives for the proposed anesthesia with the patient or authorized representative who has indicated his/her understanding and acceptance.   Dental advisory given  Plan Discussed with: CRNA and Surgeon  Anesthesia Plan Comments:         Anesthesia Quick Evaluation

## 2016-02-17 NOTE — Discharge Instructions (Signed)
Dilation and Curettage or Vacuum Curettage, Care After   These instructions give you information on caring for yourself after your procedure. Your doctor may also give you more specific instructions. Call your doctor if you have any problems or questions after your procedure.  HOME CARE  Do not drive for 24 hours.  Wait 1 week before doing any activities that wear you out.  Take your temperature 2 times a day for 4 days. Write it down. Tell your doctor if you have a fever.  Do not stand for a long time.  Do not lift, push, or pull anything over 10 pounds (4.5 kilograms).  Limit stair climbing to once or twice a day.  Rest often.  Continue with your usual diet.  Drink enough fluids to keep your pee (urine) clear or pale yellow.  If you have a hard time pooping (constipation), you may:  Take a medicine to help you go poop (laxative) as told by your doctor.  Eat more fruit and bran.  Drink more fluids. Take showers, not baths, for as long as told by your doctor.  Do not swim or use a hot tub until your doctor says it is okay.  Have someone with you for 1-2 days after the procedure.  Do not douche, use tampons, or have sex (intercourse) for 2 weeks.  Only take medicines as told by your doctor. Do not take aspirin. It can cause bleeding.  Keep all doctor visits. GET HELP IF:  You have cramps or pain not helped by medicine.  You have new pain in the belly (abdomen).  You have a bad smelling fluid coming from your vagina.  You have a rash.  You have problems with any medicine. GET HELP RIGHT AWAY IF:  You start to bleed more than a regular period.  You have a fever.  You have chest pain.  You have trouble breathing.  You feel dizzy or feel like passing out (fainting).  You pass out.  You have pain in the tops of your shoulders.  You have vaginal bleeding with or without clumps of blood (blood clots). MAKE SURE YOU:  Understand these instructions.  Will watch your condition.  Will  get help right away if you are not doing well or get worse. This information is not intended to replace advice given to you by your health care provider. Make sure you discuss any questions you have with your health care provider.  Document Released: 09/12/2008 Document Revised: 12/09/2013 Document Reviewed: 07/03/2013  Elsevier Interactive Patient Education [employer]

## 2016-02-17 NOTE — Progress Notes (Signed)
Patient entered PACU, wrestling staff to pull off mask, labored breathing, sats 70's.  RT at bedside and placed patient on BiPAP 10/6 with O2 @ 14L bleed in.  Patient settled, breathing became even, non-labored, sats 85 - 92%.  Bedside EKG and chest x-ray performed.  Right Pneumothorax found.  Pamala Hurry, MD and Ermalene Postin, MD at bedside checking on patient.  House staff made aware of patient for bed placement.  Pamala Hurry, MD notified Georgiann Mccoy, patients husband of developments.  Prescott Gum III, MD at bedside to place chest tube.  Time out performed, patient identified, site identified and marked, allergies reviewed.  Writer gave 1 mg IV versed and 25 mcg IV fentanyl for sedation.  Chest tube placed, pleur-Evac set at -20 cm, no air leak noted, 10 ml bloody drainage noted.  Patient tolerated procedure well.  Awaiting bed placement.

## 2016-02-17 NOTE — Consult Note (Signed)
PULMONARY / CRITICAL CARE MEDICINE   Name: [name] MRN: [id number] DOB: Apr 25, 1971    ADMISSION DATE:  02/17/2016 CONSULTATION DATE:  3/2  REFERRING MD:  Pamala Hurry (obgyn)   CHIEF COMPLAINT:  Respiratory failure   HISTORY OF PRESENT ILLNESS:   45yo obese female with hx bipolar disorder, HTN, DM, OHS, dCHF, uterine fibroids presented 3/2 to [facility] hospital for elective myomectomy.  She was a very difficult intubation per anesthesia.  She was extubated post op with subsequent respiratory distress, hypoxia with sats 70's.  She was tried on bipap with some improvement.  CXR revealed large R ptx with complete collapse.  CVTS placed R CT at [facility] hospital.  Pt tx to [facility] ICU for further treatment.   PAST MEDICAL HISTORY :  She  has a past medical history of Hypertension; Diabetes mellitus without complication ([place]); Hyperlipidemia (03/31/2013); Bipolar disorder ([place]) (03/31/2013); Diabetes ([place]) (03/31/2013); Vitamin D deficiency (03/31/2013); Anemia microcytic (03/31/2013); Rhabdomyolysis (03/31/2013); OSA (obstructive sleep apnea); Plantar fasciitis of left foot; and Pneumonia.  PAST SURGICAL HISTORY: She  has past surgical history that includes Ventriculoperitoneal shunt; Cesarean section; and Cyst excision.  Allergies  Allergen Reactions  . Abilify [Aripiprazole]     hyperglycemia    No current facility-administered medications on file prior to encounter.   Current Outpatient Prescriptions on File Prior to Encounter  Medication Sig  . amLODipine (NORVASC) 5 MG tablet Take 5 mg by mouth daily.  . Canagliflozin-Metformin HCl (INVOKAMET) 50-1000 MG TABS Take 1 tablet by mouth 2 (two) times daily.  . divalproex (DEPAKOTE ER) 500 MG 24 hr tablet Take 500 mg by mouth daily.   . ferrous sulfate 325 (65 FE) MG tablet Take 1 tablet (325 mg total) by mouth 2 (two) times daily with a meal. (Patient taking differently: Take 325 mg by mouth daily. )  . furosemide (LASIX) 40 MG tablet Take 1  tablet (40 mg total) by mouth daily as needed (for weight gain of > 3 lbs in 24 hr w/ lower ext swelling).  . irbesartan (AVAPRO) 150 MG tablet Take 1 tablet (150 mg total) by mouth daily.  Marland Kitchen lamoTRIgine (LAMICTAL) 200 MG tablet Take 100 mg by mouth daily. Takes 1/2 tablet daily  . Methylsulfonylmethane (MSM PO) Take 2 tablets by mouth daily.   . potassium chloride (K-DUR) 10 MEQ tablet Take 2 tablets (20 mEq total) by mouth daily as needed (take only on days that you take lasix).  . pravastatin (PRAVACHOL) 80 MG tablet Take 1 tablet (80 mg total) by mouth daily. For high cholesterol control (Patient taking differently: Take 40 mg by mouth daily. For high cholesterol control)  . traMADol (ULTRAM) 50 MG tablet Take 1 tablet (50 mg total) by mouth 2 (two) times daily as needed for severe pain.    FAMILY HISTORY:  Her has no family status information on file.   SOCIAL HISTORY: She  reports that she has never smoked. She has never used smokeless tobacco. She reports that she does not drink alcohol or use illicit drugs.  REVIEW OF SYSTEMS:   As per HPI - All other systems reviewed and were neg.    SUBJECTIVE:  Reports feels minimal pain but otherwise ok.  VITAL SIGNS: BP 117/81 mmHg  Pulse 86  Temp(Src) 97.7 F (36.5 C) (Oral)  Resp 30  SpO2 94%  HEMODYNAMICS:    VENTILATOR SETTINGS:    INTAKE / OUTPUT:    PHYSICAL EXAMINATION: General:  Chronically ill appearing female on BiPAP with intermittent confusion.  Neuro:  Awake but easily falls asleep.  Moving all ext to command. HEENT:  [place]/AT, PERRL, EOM-I and MMM. Cardiovascular:  RRR, Nl S1/S2, -M/R/G. Lungs:  Bibasilar crackles. Abdomen:  Soft, NT, ND and +BS. Musculoskeletal:  -edema and -tenderness. Skin:  Intact.  LABS:  BMET  Recent Labs Lab 02/15/16 0930  NA 138  K 3.7  CL 102  CO2 28  BUN 18  CREATININE 0.67  GLUCOSE 162*    Electrolytes  Recent Labs Lab 02/15/16 0930  CALCIUM 8.8*     CBC  Recent Labs Lab 02/15/16 0930  WBC 9.2  HGB 12.2  HCT 39.6  PLT 384    Coag's No results for input(s): APTT, INR in the last 168 hours.  Sepsis Markers No results for input(s): LATICACIDVEN, PROCALCITON, O2SATVEN in the last 168 hours.  ABG No results for input(s): PHART, PCO2ART, PO2ART in the last 168 hours.  Liver Enzymes No results for input(s): AST, ALT, ALKPHOS, BILITOT, ALBUMIN in the last 168 hours.  Cardiac Enzymes No results for input(s): TROPONINI, PROBNP in the last 168 hours.  Glucose  Recent Labs Lab 02/17/16 0950 02/17/16 1324  GLUCAP 87 150*    Imaging Dg Chest Port 1 View  02/17/2016  CLINICAL DATA:  Pneumothorax EXAM: PORTABLE CHEST 1 VIEW COMPARISON:  Earlier today FINDINGS: Right chest tube as been placed. Right pneumothorax has resolved. Patchy airspace opacities have developed throughout the right lung. There are hazy airspace opacities in the left mid and lower lung zone. Cardiomegaly. VP shunt fragment remains in place. IMPRESSION: Right chest tube placed with resolution of the right pneumothorax Bilateral airspace disease. Electronically Signed   By: Marybelle Killings M.D.   On: 02/17/2016 15:19   Dg Chest Port 1 View  02/17/2016  CLINICAL DATA:  Hypoxemia, postop, history of VP shunt EXAM: PORTABLE CHEST 1 VIEW COMPARISON:  05/05/2013 FINDINGS: Borderline cardiomegaly. Extensive right pneumothorax with complete collapse of the right lung. Left lung is clear.  Right VP shunt catheter is stable in position. IMPRESSION: Massive right pneumothorax with complete collapse of the right lung. Critical Value/emergent results were called by telephone at the time of interpretation on 02/17/2016 at 2:05 pm to Dr. Oleta Mouse , who verbally acknowledged these results. Electronically Signed   By: Lahoma Crocker M.D.   On: 02/17/2016 14:06     STUDIES:    CULTURES:   ANTIBIOTICS:   SIGNIFICANT EVENTS: 3/2 >>elective myomectomy - post op resp  failure, ptx   LINES/TUBES: R Chest tube Lucianne Lei trigt) 3/2>>>  DISCUSSION: 45yo female with hx HTN, obesity, OSA/OHS with post op respiratory failure following uncomplicated elective myomectomy for uterine fibroids.  Large R ptx, chest tube placed per CVTS.  To [facility] for further monitoring.   ASSESSMENT / PLAN:  PULMONARY Acute respiratory failure  Complete R ptx - s/p CT placement by CVTS  OSA/OHS  -- followed by [facility].  No OSA on psg.  Does have OHS, needs nocturnal O2 Re-expansion pulm edema  P:   Bipap PRN  Chest tube to 20cm suction  CXR in am  Supplemental O2 as needed    CARDIOVASCULAR Hx HTN P:  Resume home norvasc in am  Hold home lasix, avapro for now  2D echo. BNP.  RENAL No active issue  P:   Check chem now   GASTROINTESTINAL No active issue  P:   NPO post op   GYN  Uterine fibroids  - s/p myomectomy  Menorrhagia  P:  Post op care  per OBGYN   HEMATOLOGIC No active issue  P:  CBC now  SCD's   INFECTIOUS No active issue  P:   Monitor wbc, fever curve off abx post op   ENDOCRINE DM  P:   SSI   NEUROLOGIC Hx bipolar disorder  Hx VP shunt  P:   Resume home depakote, lamictal for now - resume when taking PO's  PRN pain control   FAMILY  - Updates:   Attending Note:  45 year old female with PMH above presenting with a PTX and pulmonary edema post extubation.  On exam, the patient is protecting her airway, lungs with bibasilar crackles.  Discussed with CVTS-MD and H/O MD.  Will admit to the ICU for observation, ready for transfer to SDU however.    PTX  - CT to suction.  - F/U CXR.  - CVTS following.  Pulmonary edema:  - Lasix as ordered.  - F/U CXR.  OSA  - Change BiPAP to CPAP for when asleep.  - Ok to transfer to the SDU.  Hypoxemia:  - Titrate O2 for sat of 88-92%.  - May need an ambulatory desaturation study prior to d/c for home O2.  CHF history:  - 2D echo.  - BNP.  Patient seen and examined, agree with above  note.  I dictated the care and orders written for this patient under my direction.  Rush Farmer, MD [phone]

## 2016-02-17 NOTE — H&P (Signed)
CC: menorrhagia  HPI: 45 yo G4P2 non-pregnant with h/o TL, being followed for menorrhagia. Pt notes continued heavy, regular cycles despite Lysteda and use of Nexplanon, which has been removed. She has a known h/o fibroids, prior myomectomy, and has been trying to avoid major surgery. Given the new increase in bleeding and associated anemia, SIS was done which revealed SM fibroids- post wall, 1.3 x 1.2 x 1.1 cm and pt presents for hysteroscopic removal of this.   U/s 12/16: uterus 12.7 x 9.4 x 8.4, 420 cc with 18 mm EMS and multiple fibroids SIS 2/17: SM post fibroid 1.3 x 1.2 x 1.1, thin EMS Ebx, 2015: proliferative endometrium 5/14: nl pap.  PMH: fibroids, T2DM, htn, migriane, bipolar Past Medical History  Diagnosis Date  . Hypertension   . Diabetes mellitus without complication ([place])   . Hyperlipidemia 03/31/2013  . Bipolar disorder ([place]) 03/31/2013  . Diabetes ([place]) 03/31/2013  . Vitamin D deficiency 03/31/2013  . Anemia microcytic 03/31/2013  . Rhabdomyolysis 03/31/2013    [place] secondary to abilify  . OSA (obstructive sleep apnea)   . Plantar fasciitis of left foot   . Pneumonia      PSH: c/s x 2, myomectomy- Xlap, TL Past Surgical History  Procedure Laterality Date  . Ventriculoperitoneal shunt    . Cesarean section    . Cyst excision      All: ablify, no latex allergy, no abx allergy Meds: gluocophage, lamictal, depakote, pravastatin, ARB-HCTZ  PE:  Filed Vitals:   02/17/16 0950  BP: 147/105  Pulse: 71  Temp: 98 F (36.7 C)  TempSrc: Oral  Resp: 18  SpO2: 100%   Gen: well appearing, no distress Abd: obese, NT LE: NT, no edema GU: def to OR   CBC    Component Value Date/Time   WBC 9.2 02/15/2016 0930   RBC 4.69 02/15/2016 0930   HGB 12.2 02/15/2016 0930   HCT 39.6 02/15/2016 0930   PLT 384 02/15/2016 0930   MCV 84.4 02/15/2016 0930   MCH 26.0 02/15/2016 0930   MCHC 30.8 02/15/2016 0930   RDW 15.2 02/15/2016 0930   LYMPHSABS 2.2 02/23/2014 0142   MONOABS 0.8 02/23/2014 0142   EOSABS 0.2 02/23/2014 0142   BASOSABS 0.0 02/23/2014 0142      A/P: Menorrhagia with multiple fibroids, for hysteroscopic resection of SM fibroid. Pt aware R/B of surgery including continued bleeding both perioperatively and lack of long term menstrual control.  Anemia T2DM Bipolar d/o   Tajae Rybicki A. 02/17/2016 11:04 AM

## 2016-02-17 NOTE — Op Note (Signed)
[name], [name] NO.:  [id number]  MEDICAL RECORD NO.:  [id number]  LOCATION:  2M16C                        FACILITY:  [facility]  PHYSICIAN:  Ivin Poot, M.D.  DATE OF BIRTH:  1971/01/25  DATE OF PROCEDURE:  02/17/2016 DATE OF DISCHARGE:                              OPERATIVE REPORT   OPERATION:  Placement of right chest tube.  PREOPERATIVE DIAGNOSIS:  Spontaneous intraoperative right pneumothorax during gyn procedure with subsequent respiratory distress.  POSTOPERATIVE DIAGNOSIS:  Spontaneous intraoperative right pneumothorax during gyn procedure with subsequent respiratory distress.  CLINICAL NOTE:  The patient is a 45 year old, obese female who underwent a uterine procedure under general anesthesia.  She was reported to be a difficult intubation.  The patient was extubated in the operating room, returned to recovery room, but her oxygen saturations were low.  A chest x-ray in the recovering showed total collapse of the right lung.  She was desaturating and required 100% oxygen.  Thoracic surgical evaluation and care was requested.  I traveled to the [facility] recovery room and examined the patient.  She was in obvious distress on a BiPAP high oxygen mask.  There was no family in the hospital.  I discussed the procedure with the patient and marked her right anterior chest.  We proceeded with rapid sterile prep and drape of right chest and anesthesia was infiltrated locally.  PROCEDURE IN DETAIL:  A proper time-out was performed.  A small incision was made in the area of local anesthesia, 1% lidocaine infiltration. Supplemental IV conscious sedation under monitored conditions was provided.  The patient's anesthesiologist was present.  A 20-French chest tube was then placed into the right pleural space through a small incision in the anterior axillary line at the 5th interspace and directed to the apex.  There was a large rush of air through  the incision as the chest tube was being placed.  The chest tube was secured with a silk suture, connected to a Pleur-Evac underwater seal drainage system, and covered with a sterile dressing and taped securely.  A followup chest x-ray showed re-expansion of the right lung and the chest tube in good position.  There was some re-expansion pulmonary edema of the right lung.  Plan was for the patient to be transferred to [facility] for critical care admission and I will follow the care of the right chest tube.     Ivin Poot, M.D.    PV/MEDQ  D:  02/17/2016  T:  02/17/2016  Job:  [id number]

## 2016-02-17 NOTE — Progress Notes (Signed)
Interval history:   At the conclusion of uncomplicated hysteroscopic myomectomy, pt extubated then desaturation noted. See anasthesia notes for details. Foley placed, CPAP initiated, Lasix given and EKG and CXR ordered.   Given ICU level care need, pt to be transferred. Husband aware.  Anjelo Pullman A. 02/17/2016 1:49 PM

## 2016-02-17 NOTE — Progress Notes (Signed)
Placed on NCPAP upon arrival to PACU with settings of 10/6 with 100% O2 bleed in at 12 LPM.  SATs in the low 80s at first until she calmed and got used to the CPAP.  Sats slowly rose to the upper 80s .  EKG and CXR done and a large Right Pneumothorax was noted.

## 2016-02-17 NOTE — Progress Notes (Addendum)
Interval Update-  CXR revealed pneumothorax. Cardiothoracic surgery consulted for chest tube placement. Findings d/w pt and her husband (via telephone call) regarding plan for chest tube and transfer to ICU at [place]. Pt and husband in agreement.  In terms of gyn management, hysteroscopic myomectomy was uncomplicated and this is an outpatient surgery. Patients are usually discharged with ibuprofen and as needed percocet for pain. Small amounts of vaginal bleeding, like Miller menses or less, are expected. Patients will usually resume any home medications. No specific gyn care is needed. Transfer care to critical care team but I will remain available if needed.  [name]. 02/17/2016 5:12 PM  Cell: [phone]  30 min at pt bedside coordinating care

## 2016-02-17 NOTE — Brief Op Note (Signed)
02/17/2016  12:18 PM  PATIENT:  [name]  45 y.o. female  PRE-OPERATIVE DIAGNOSIS:  Uterine Fibroids, Menorrhagia  POST-OPERATIVE DIAGNOSIS:  Uterine Fibroids, Menorrhagia  PROCEDURE:  Procedure(s): DILATATION & CURETTAGE/HYSTEROSCOPY/Myomectomy WITH MYOSURE (N/A) MYOMECTOMY WITH MYOSURE (N/A)  SURGEON:  Surgeon(s) and Role:    * Aloha Gell, MD - Primary  PHYSICIAN ASSISTANT: none  ASSISTANTS: none   ANESTHESIA:   general  EBL:  Total I/O In: 1000 [I.V.:1000] Out: -   BLOOD ADMINISTERED:none  DRAINS: none   LOCAL MEDICATIONS USED:  MARCAINE, 20 cc, 1/2%  SPECIMEN:  Source of Specimen:  uterine fibroid and polyp  DISPOSITION OF SPECIMEN:  PATHOLOGY  COUNTS:  YES  TOURNIQUET:  * No tourniquets in log *  DICTATION: .Note written in EPIC  PLAN OF CARE: Discharge to home after PACU  PATIENT DISPOSITION:  PACU - hemodynamically stable.   Delay start of Pharmacological VTE agent (>24hrs) due to surgical blood loss or risk of bleeding: yes

## 2016-02-17 NOTE — Progress Notes (Signed)
Patient came by [facility] on bi-pap. RT tried to place patient of full face mask and patient kept pushing it away. Patient looks good and is maintaining her O2 sat on a 40% venti mask. RT will continue to monitor.

## 2016-02-17 NOTE — Transfer of Care (Signed)
Immediate Anesthesia Transfer of Care Note  Patient: [name]  Procedure(s) Performed: Procedure(s): DILATATION & CURETTAGE/HYSTEROSCOPY/Myomectomy WITH MYOSURE (N/A) [place] (N/A)  Patient Location: PACU  Anesthesia Type:General  Level of Consciousness: awake, pateint uncooperative and confused  Airway & Oxygen Therapy: Patient Spontanous Breathing, Patient connected to face mask oxygen and Patient placed on Ventilator (see vital sign flow sheet for setting)  Post-op Assessment: Report given to RN and Post -op Vital signs reviewed and unstable, Anesthesiologist notified  Post vital signs: Reviewed  Last Vitals:  Filed Vitals:   02/17/16 0950  BP: 147/105  Pulse: 71  Temp: 36.7 C  Resp: 18    Complications: No apparent anesthesia complications

## 2016-02-17 NOTE — Op Note (Signed)
02/17/2016  12:18 PM  PATIENT:  [name]  45 y.o. female  PRE-OPERATIVE DIAGNOSIS:  Uterine Fibroids, Menorrhagia  POST-OPERATIVE DIAGNOSIS:  Uterine Fibroids, Menorrhagia  PROCEDURE:  Procedure(s): DILATATION & CURETTAGE/HYSTEROSCOPY/Myomectomy WITH MYOSURE (N/A) MYOMECTOMY WITH MYOSURE (N/A)  SURGEON:  Surgeon(s) and Role:    * Aloha Gell, MD - Primary  PHYSICIAN ASSISTANT: none  ASSISTANTS: none   ANESTHESIA:   general  EBL:  Total I/O In: 1000 [I.V.:1000] Out: -   BLOOD ADMINISTERED:none  DRAINS: none   LOCAL MEDICATIONS USED:  MARCAINE, 20 cc, 1/2%  SPECIMEN:  Source of Specimen:  uterine fibroid and polyp  DISPOSITION OF SPECIMEN:  PATHOLOGY  COUNTS:  YES  TOURNIQUET:  * No tourniquets in log *  DICTATION: .Note written in EPIC  PLAN OF CARE: Discharge to home after PACU  PATIENT DISPOSITION:  PACU - hemodynamically stable.   Delay start of Pharmacological VTE agent (>24hrs) due to surgical blood loss or risk of bleeding: yes   Abx: 100mg  IV doxycycline  Findings: small posterior polyp, 2 cm intracavitary fibroid from L lower segment,  visualization of bilateral ostia, hemostasis post-procedure  Indications: submucosal fibroid, bleeding,   After informed consent including discussion of risks of bleeding, infection, perforation,  Failure to achieve normal bleeding patterns,  the patient was taken to the operating room where general anesthesia was initiated. She was prepped and draped in normal sterile fashion in the dorsal supine lithotomy position.  A bimanual examination was done to assess the size and position of the uterus. A speculum was placed in the vagina and single tooth tenaculum used to grasp the anterior lip of the cervix. Local anaesthetic was injected at 5 and 7 o'clock in there cervico-paracervical junction.   The cervix was then serially dilated to a #19 Pratt dilator. The camera was placed through the cervix and past the  internal os. Visualization of pathology as above. Myosure was then placed through the operating channel and resection of the pathology without complication. The remainder of the uterus appeared normal. Adequate hemostasis was noted though some bleeding from the fibroid bed was noted.  The hysteroscope was then removed. Tenaculum was removed. The tenaculum site was hemostatic and the case was terminated. The patient tolerated the procedure well. Sponge, lap and needle counts were correct and the patient was taken to the recovery room in stable condition.   [name] A. 02/17/2016 12:20 PM

## 2016-02-18 ENCOUNTER — Encounter (HOSPITAL_COMMUNITY): Payer: Self-pay | Admitting: Obstetrics

## 2016-02-18 ENCOUNTER — Inpatient Hospital Stay (HOSPITAL_COMMUNITY): Payer: Federal, State, Local not specified - PPO

## 2016-02-18 DIAGNOSIS — S270XXD Traumatic pneumothorax, subsequent encounter: Secondary | ICD-10-CM

## 2016-02-18 DIAGNOSIS — S270XXA Traumatic pneumothorax, initial encounter: Secondary | ICD-10-CM

## 2016-02-18 DIAGNOSIS — I5033 Acute on chronic diastolic (congestive) heart failure: Secondary | ICD-10-CM

## 2016-02-18 LAB — GLUCOSE, CAPILLARY
GLUCOSE-CAPILLARY: 123 mg/dL — AB (ref 65–99)
GLUCOSE-CAPILLARY: 135 mg/dL — AB (ref 65–99)
GLUCOSE-CAPILLARY: 151 mg/dL — AB (ref 65–99)
Glucose-Capillary: 157 mg/dL — ABNORMAL HIGH (ref 65–99)
Glucose-Capillary: 97 mg/dL (ref 65–99)

## 2016-02-18 MED ORDER — ENOXAPARIN SODIUM 40 MG/0.4ML ~~LOC~~ SOLN
40.0000 mg | SUBCUTANEOUS | Status: DC
  Administered 2016-02-18 – 2016-02-22 (×5): 40 mg via SUBCUTANEOUS
  Filled 2016-02-18 (×5): qty 0.4

## 2016-02-18 MED ORDER — FUROSEMIDE 10 MG/ML IJ SOLN
40.0000 mg | Freq: Two times a day (BID) | INTRAMUSCULAR | Status: AC
  Administered 2016-02-18 (×2): 40 mg via INTRAVENOUS
  Filled 2016-02-18 (×2): qty 4

## 2016-02-18 MED ORDER — AMLODIPINE BESYLATE 5 MG PO TABS
5.0000 mg | ORAL_TABLET | Freq: Every day | ORAL | Status: DC
  Administered 2016-02-18 – 2016-02-23 (×5): 5 mg via ORAL
  Filled 2016-02-18 (×6): qty 1

## 2016-02-18 MED ORDER — OXYCODONE HCL 5 MG PO TABS
5.0000 mg | ORAL_TABLET | ORAL | Status: DC | PRN
  Administered 2016-02-19 – 2016-02-20 (×2): 5 mg via ORAL
  Filled 2016-02-18 (×2): qty 1

## 2016-02-18 MED ORDER — LAMOTRIGINE 100 MG PO TABS: 100.0000 mg | ORAL_TABLET | Freq: Every day | ORAL | Status: DC

## 2016-02-18 MED ORDER — TRAMADOL HCL 50 MG PO TABS
50.0000 mg | ORAL_TABLET | Freq: Four times a day (QID) | ORAL | Status: DC | PRN
  Administered 2016-02-18: 50 mg via ORAL
  Filled 2016-02-18: qty 1

## 2016-02-18 MED ORDER — CETYLPYRIDINIUM CHLORIDE 0.05 % MT LIQD
7.0000 mL | Freq: Two times a day (BID) | OROMUCOSAL | Status: DC
  Administered 2016-02-18 – 2016-02-23 (×9): 7 mL via OROMUCOSAL

## 2016-02-18 MED ORDER — ACETAMINOPHEN 325 MG PO TABS
650.0000 mg | ORAL_TABLET | Freq: Four times a day (QID) | ORAL | Status: DC | PRN
  Administered 2016-02-19 – 2016-02-22 (×7): 650 mg via ORAL
  Filled 2016-02-18 (×7): qty 2

## 2016-02-18 NOTE — Progress Notes (Signed)
S: Pt notes feeling sleepy but no pain, no CP, no SOB. Pt tolerating po. Pt states bleeding is minimal.  O: Filed Vitals:   02/18/16 1100 02/18/16 1107 02/18/16 1200 02/18/16 1300  BP: 128/75  146/91 111/77  Pulse: 88  88 89  Temp:  97.8 F (36.6 C)    TempSrc:  Oral    Resp: 18  20 24   Weight:      SpO2: 96%  96% 98%   Gen: sleepy but arouses for conversation, general demeanor/ mood/ affect at pt's baseline Abd: soft, nondistended, NT, fascial laxity noted without hernia, uterus non-tender, non-distended GU: no bleeding LE: NT, SCDs in place  A/P: POD # 1 s/p hysteroscopic myomectomy with intra-op R pneumothorax and desaturation requiring chest tube placement and transfer to MICU.  - myomectomy. No/ minimal vaginal bleeding despite prophylactic Lovenox. No active gyn issues/ needs at this time. Will continue with plan for IUD insert in 2 wks to prevent cyclic menorrhagia and treat remaining intramural fibroids. Will continue with plan for minimally invasive treatment for her fibroids/ menorrhagia with goal to avoid hysterectomy.  - Cardiopulmonary- Appreciate MICU care/ team/  Internist/ and Cardiothoracic care. Defer medical care to team.   Danylle Ouk A. 02/18/2016 3:53 PM   I do not plan to continue to round on this patient as her gynecologic condition is stable. If there is a change in status or need for gynecologic input please do not hesistate to call. Cell: [phone]

## 2016-02-18 NOTE — Progress Notes (Signed)
Chest tube to water seal.

## 2016-02-18 NOTE — Progress Notes (Signed)
[place] TEAM 1 - Stepdown/ICU TEAM PROGRESS NOTE  [name] [id number] DOB: December 18, 1971 DOA: 02/17/2016 PCP: Horatio Pel, MD  Admit HPI / Brief Narrative: 45yo obese female with hx bipolar disorder, HTN, DM, OHS, dCHF, and uterine fibroids who presented to [facility] 3/2 for elective myomectomy. She was a very difficult intubation per anesthesia. She was extubated post op with subsequent respiratory distress (hypoxia with sats 70's). She was tried on bipap with some improvement. CXR revealed a large R ptx with complete collapse. CVTS placed R CT at [place] tx to [facility] ICU for further treatment.  Significant Events: 3/2 - elective myomectomy at [facility] 3/2 - emergent CT placement per TCTS - transfer to [facility] ICU  HPI/Subjective: The pt is eating breakfast w/o difficulty.  She reports a modest amount of pain in her R chest.  She denies f/c, n.v, or abdom pain.  She relates only "a little" vaginal bleeding "like her period is finishing up."  Assessment/Plan:  Acute hypoxic respiratory failure - Complete R PTX Care of CT per TCTS  Pulm Edema S/p re-expansion of R lung - cont diuresis - follow serial CXR  Chronic grade 2 Diastolic CHF w/ acute exacerbation  scheduled diuresis - stop IVF  OHS  Followed by Halford Chessman as outpt - did not actually have apnea on sleep study   DM CBG reasonably controlled - follow trend   HTN BP reasonably controlled - follow  Hx bipolar disorder  Cont usual home meds   Hx VP shunt   Uterine fibroids- Menorrhagia - s/p hysterocopic myomectomy  Post op care per OB/GYN (see their note of 02/17/16)  Morbid obesity - Body mass index is 38.25 kg/(m^2).  Code Status: FULL Family Communication: no family present at time of exam Disposition Plan: transfer to SDU - begin to ambulate   Consultants: TCTS PCCM  Antibiotics: none  DVT prophylaxis: lovenox  Objective: Blood pressure 135/89, pulse 92,  temperature 98.7 F (37.1 C), temperature source Oral, resp. rate 22, weight 80.2 kg (176 lb 12.9 oz), last menstrual period 02/09/2016, SpO2 100 %.  Intake/Output Summary (Last 24 hours) at 02/18/16 0937 Last data filed at 02/18/16 0700  Gross per 24 hour  Intake   3450 ml  Output   4000 ml  Net   -550 ml   Exam: General: No acute respiratory distress at rest  Lungs: Clear to auscultation bilaterally without wheezes or crackles Cardiovascular: Regular rate and rhythm without murmur gallop or rub normal S1 and S2 Abdomen: Nontender, nondistended, soft, bowel sounds positive, no rebound, no ascites, no appreciable mass Extremities: No significant cyanosis, clubbing, or edema bilateral lower extremities  Data Reviewed: Basic Metabolic Panel:  Recent Labs Lab 02/15/16 0930  NA 138  K 3.7  CL 102  CO2 28  GLUCOSE 162*  BUN 18  CREATININE 0.67  CALCIUM 8.8*    CBC:  Recent Labs Lab 02/15/16 0930  WBC 9.2  HGB 12.2  HCT 39.6  MCV 84.4  PLT 384    Liver Function Tests: No results for input(s): AST, ALT, ALKPHOS, BILITOT, PROT, ALBUMIN in the last 168 hours. No results for input(s): LIPASE, AMYLASE in the last 168 hours. No results for input(s): AMMONIA in the last 168 hours.  CBG:  Recent Labs Lab 02/17/16 1729 02/17/16 2104 02/18/16 0007 02/18/16 0341 02/18/16 0841  GLUCAP 147* 128* 97 123* 135*    Recent Results (from the past 240 hour(s))  MRSA PCR Screening     Status:  None   Collection Time: 02/17/16  5:37 PM  Result Value Ref Range Status   MRSA by PCR NEGATIVE NEGATIVE Final    Comment:        The GeneXpert MRSA Assay (FDA approved for NASAL specimens only), is one component of a comprehensive MRSA colonization surveillance program. It is not intended to diagnose MRSA infection nor to guide or monitor treatment for MRSA infections.      Studies:   Recent x-ray studies have been reviewed in detail by the Attending Physician  Scheduled  Meds:  Scheduled Meds: . antiseptic oral rinse  7 mL Mouth Rinse BID  . divalproex  500 mg Oral Daily  . insulin aspart  2-6 Units Subcutaneous 6 times per day  . lamoTRIgine  200 mg Oral Daily    Time spent on care of this patient: 35 mins   MCCLUNG,JEFFREY T , MD   [facility] Office  [phone] Pager - Text Page per Shea Evans as per below:  On-Call/Text Page:      [email or web address]      password [email or web address]  If 7PM-7AM, please contact night-coverage [email or web address] Password [email or web address] 02/18/2016, 9:37 AM   LOS: 1 day

## 2016-02-18 NOTE — Progress Notes (Signed)
Discussed with Mid-level [facility] Pts condition and acuity. Received order to transfer to 2W

## 2016-02-18 NOTE — Progress Notes (Addendum)
      [address]       [place] [address]             [phone]       1 Day Post-Op Procedure(s) (LRB): DILATATION & CURETTAGE/HYSTEROSCOPY/Myomectomy WITH MYOSURE (N/A) MYOMECTOMY WITH MYOSURE (N/A)  Subjective: Patient sleeping-awakened. No breathing complaints at this time. Does have some pain at right chest tube site.  Objective: Vital signs in last 24 hours: Temp:  [97.7 F (36.5 C)-98.7 F (37.1 C)] 97.8 F (36.6 C) (03/03 1107) Pulse Rate:  [77-97] 89 (03/03 1300) Cardiac Rhythm:  [-] Normal sinus rhythm (03/03 0700) Resp:  [14-30] 24 (03/03 1300) BP: (111-146)/(68-98) 111/77 mmHg (03/03 1300) SpO2:  [69 %-100 %] 98 % (03/03 1300) FiO2 (%):  [40 %] 40 % (03/03 0700) Weight:  [176 lb 12.9 oz (80.2 kg)] 176 lb 12.9 oz (80.2 kg) (03/03 0500)      Intake/Output from previous day: 03/02 0701 - 03/03 0700 In: 3450 [I.V.:3450] Out: 4000 [Urine:3950; Blood:50]   Physical Exam:  Cardiovascular: RRR Pulmonary: Slightly diminished at bases; no rales, wheezes, or rhonchi. Chest Tube: to suction, no air leak  Lab Results: CBC:No results for input(s): WBC, HGB, HCT, PLT in the last 72 hours. BMET: No results for input(s): NA, K, CL, CO2, GLUCOSE, BUN, CREATININE, CALCIUM in the last 72 hours.  PT/INR: No results for input(s): LABPROT, INR in the last 72 hours. ABG:  INR: Will add last result for INR, ABG once components are confirmed Will add last 4 CBG results once components are confirmed  Assessment/Plan:  1. CV - SR. On Norvasc 5 mg daily 2.  Pulmonary - S/p right chest tube placement yesterday. Chest tube with scant output and is to suction. There is no air leak. CXR shows no pneumothorax, atelectasis on left, stable cardiomegaly. Will place chest tube to water seal. Check CXR in am. 3. Volume overload/CHF-on Lasix 40 mg IV bid  ZIMMERMAN,DONIELLE MPA-C 02/18/2016,2:41 PM The patient has residual reexpansion pulmonary edema the right  lung We'll leave chest tube to water seal until the right lung infiltrate-edema clears patient examined and medical record reviewed,agree with above note. Tharon Aquas Trigt III 02/18/2016

## 2016-02-18 NOTE — Care Management Note (Signed)
Case Management Note  Patient Details  Name: Kenley Hoxit MRN: [id number] Date of Birth: 28-Dec-1970  Subjective/Objective:    S/p elective myomectomy - resp failure .  Complete right pnemo requiring emergent chest tube - transferred to [facility]                Action/Plan:   Pt is independent from home with teenage daughters.  CM will continue to montior   Expected Discharge Date:                  Expected Discharge Plan:  Home/Self Care  In-House Referral:     Discharge planning Services  CM Consult  Post Acute Care Choice:    Choice offered to:     DME Arranged:    DME Agency:     HH Arranged:    HH Agency:     Status of Service:  In process, will continue to follow  Medicare Important Message Given:    Date Medicare IM Given:    Medicare IM give by:    Date Additional Medicare IM Given:    Additional Medicare Important Message give by:     If discussed at [place] of Stay Meetings, dates discussed:    Additional Comments:  Maryclare Labrador, RN 02/18/2016, 10:41 AM

## 2016-02-19 ENCOUNTER — Inpatient Hospital Stay (HOSPITAL_COMMUNITY): Payer: Federal, State, Local not specified - PPO

## 2016-02-19 DIAGNOSIS — J9383 Other pneumothorax: Secondary | ICD-10-CM

## 2016-02-19 DIAGNOSIS — R0600 Dyspnea, unspecified: Secondary | ICD-10-CM

## 2016-02-19 DIAGNOSIS — D72829 Elevated white blood cell count, unspecified: Secondary | ICD-10-CM

## 2016-02-19 LAB — CBC
HEMATOCRIT: 35.4 % — AB (ref 36.0–46.0)
HEMOGLOBIN: 10.7 g/dL — AB (ref 12.0–15.0)
MCH: 26.6 pg (ref 26.0–34.0)
MCHC: 30.2 g/dL (ref 30.0–36.0)
MCV: 87.8 fL (ref 78.0–100.0)
Platelets: 379 10*3/uL (ref 150–400)
RBC: 4.03 MIL/uL (ref 3.87–5.11)
RDW: 15.4 % (ref 11.5–15.5)
WBC: 14.1 10*3/uL — ABNORMAL HIGH (ref 4.0–10.5)

## 2016-02-19 LAB — URINALYSIS, ROUTINE W REFLEX MICROSCOPIC
BILIRUBIN URINE: NEGATIVE
GLUCOSE, UA: NEGATIVE mg/dL
HGB URINE DIPSTICK: NEGATIVE
Ketones, ur: NEGATIVE mg/dL
Nitrite: NEGATIVE
PH: 5 (ref 5.0–8.0)
Protein, ur: NEGATIVE mg/dL
SPECIFIC GRAVITY, URINE: 1.013 (ref 1.005–1.030)

## 2016-02-19 LAB — GLUCOSE, CAPILLARY
GLUCOSE-CAPILLARY: 126 mg/dL — AB (ref 65–99)
Glucose-Capillary: 172 mg/dL — ABNORMAL HIGH (ref 65–99)
Glucose-Capillary: 120 mg/dL — ABNORMAL HIGH (ref 65–99)

## 2016-02-19 LAB — BASIC METABOLIC PANEL
ANION GAP: 8 (ref 5–15)
BUN: 13 mg/dL (ref 6–20)
CALCIUM: 9.1 mg/dL (ref 8.9–10.3)
CHLORIDE: 95 mmol/L — AB (ref 101–111)
CO2: 34 mmol/L — AB (ref 22–32)
Creatinine, Ser: 0.6 mg/dL (ref 0.44–1.00)
GFR calc Af Amer: >60 mL/min (ref >60)
GFR calc non Af Amer: >60 mL/min (ref >60)
GLUCOSE: 140 mg/dL — AB (ref 65–99)
POTASSIUM: 4.1 mmol/L (ref 3.5–5.1)
Sodium: 137 mmol/L (ref 135–145)

## 2016-02-19 LAB — URINE MICROSCOPIC-ADD ON

## 2016-02-19 MED ORDER — DIVALPROEX SODIUM ER 500 MG PO TB24
500.0000 mg | ORAL_TABLET | Freq: Every day | ORAL | Status: DC
  Administered 2016-02-19 – 2016-02-23 (×5): 500 mg via ORAL
  Filled 2016-02-19 (×9): qty 1

## 2016-02-19 MED ORDER — IRBESARTAN 150 MG PO TABS
150.0000 mg | ORAL_TABLET | Freq: Every day | ORAL | Status: DC
  Administered 2016-02-20 – 2016-02-23 (×4): 150 mg via ORAL
  Filled 2016-02-19 (×5): qty 1

## 2016-02-19 MED ORDER — INSULIN ASPART 100 UNIT/ML ~~LOC~~ SOLN: 0.0000 [IU] | Freq: Every day | SUBCUTANEOUS | Status: DC

## 2016-02-19 MED ORDER — INSULIN ASPART 100 UNIT/ML ~~LOC~~ SOLN
0.0000 [IU] | Freq: Three times a day (TID) | SUBCUTANEOUS | Status: DC
  Administered 2016-02-19: 1 [IU] via SUBCUTANEOUS
  Administered 2016-02-19 – 2016-02-20 (×2): 2 [IU] via SUBCUTANEOUS
  Administered 2016-02-20: 1 [IU] via SUBCUTANEOUS
  Administered 2016-02-20: 2 [IU] via SUBCUTANEOUS
  Administered 2016-02-21: 1 [IU] via SUBCUTANEOUS
  Administered 2016-02-21 – 2016-02-22 (×2): 2 [IU] via SUBCUTANEOUS
  Administered 2016-02-22 (×2): 1 [IU] via SUBCUTANEOUS
  Administered 2016-02-23: 2 [IU] via SUBCUTANEOUS

## 2016-02-19 MED ORDER — LAMOTRIGINE 100 MG PO TABS
100.0000 mg | ORAL_TABLET | Freq: Every day | ORAL | Status: DC
  Administered 2016-02-19 – 2016-02-23 (×5): 100 mg via ORAL
  Filled 2016-02-19 (×8): qty 1

## 2016-02-19 MED ORDER — POTASSIUM CHLORIDE CRYS ER 20 MEQ PO TBCR
20.0000 meq | EXTENDED_RELEASE_TABLET | Freq: Every day | ORAL | Status: DC
  Administered 2016-02-19 – 2016-02-23 (×5): 20 meq via ORAL
  Filled 2016-02-19 (×6): qty 1

## 2016-02-19 MED ORDER — FUROSEMIDE 40 MG PO TABS
40.0000 mg | ORAL_TABLET | Freq: Two times a day (BID) | ORAL | Status: DC
  Administered 2016-02-19 – 2016-02-21 (×4): 40 mg via ORAL
  Filled 2016-02-19 (×5): qty 1

## 2016-02-19 NOTE — Procedures (Signed)
Pt does not wish to wear cpap will inform RT if anything changes. 

## 2016-02-19 NOTE — Progress Notes (Addendum)
      [address]       [place] [address]             [phone]       2 Days Post-Op Procedure(s) (LRB): DILATATION & CURETTAGE/HYSTEROSCOPY/Myomectomy WITH MYOSURE (N/A) MYOMECTOMY WITH MYOSURE (N/A)  Subjective: Patient has some incisional pain on right side  Objective: Vital signs in last 24 hours: Temp:  [97.8 F (36.6 C)-99.5 F (37.5 C)] 99.5 F (37.5 C) (03/04 0555) Pulse Rate:  [80-95] 95 (03/04 0555) Cardiac Rhythm:  [-] Normal sinus rhythm (03/04 0756) Resp:  [17-29] 18 (03/04 0555) BP: (105-146)/(49-91) 105/49 mmHg (03/04 0555) SpO2:  [96 %-99 %] 98 % (03/04 0555) Weight:  [177 lb 7.5 oz (80.5 kg)] 177 lb 7.5 oz (80.5 kg) (03/03 2322)     Intake/Output from previous day: 03/03 0701 - 03/04 0700 In: 450 [P.O.:200; I.V.:250] Out: 2300 [Urine:2300]   Physical Exam:  Cardiovascular: RRR Pulmonary: Slightly diminished at bases; no rales, wheezes, or rhonchi. Chest Tube: to suction, no air leak  Lab Results: CBC:  Recent Labs  02/19/16 0441  WBC 14.1*  HGB 10.7*  HCT 35.4*  PLT 379   BMET:   Recent Labs  02/19/16 0441  NA 137  K 4.1  CL 95*  CO2 34*  GLUCOSE 140*  BUN 13  CREATININE 0.60  CALCIUM 9.1    PT/INR: No results for input(s): LABPROT, INR in the last 72 hours. ABG:  INR: Will add last result for INR, ABG once components are confirmed Will add last 4 CBG results once components are confirmed  Assessment/Plan:  1. CV - SR. On Norvasc 5 mg daily. Echo done this am-await results 2.  Pulmonary - S/p right chest tube placement 3/2. Chest tube with scant output and is to water seal. There is no air leak. CXR shows no pneumothorax, bibasilar opacities, stable cardiomegaly.  Chest tube to remain today, hope to remove in am. Check CXR in am. 3. Volume overload/CHF-on Lasix 40 mg po bid  ZIMMERMAN,DONIELLE MPA-C 02/19/2016,9:56 AM  Chart reviewed, patient examined, agree with above. CXR looks ok and no air leak.  Tube can be removed tomorrow if no changes.

## 2016-02-19 NOTE — Progress Notes (Signed)
[place] TEAM 1 - Stepdown/ICU TEAM PROGRESS NOTE  [name] [id number] DOB: [birth year] DOA: 02/17/2016 PCP: Horatio Pel, MD  Admit HPI / Brief Narrative: 45yo obese female with hx bipolar disorder, HTN, DM, OHS, dCHF, and uterine fibroids who presented to [facility] 3/2 for elective myomectomy. She was a very difficult intubation per anesthesia. She was extubated post op with subsequent respiratory distress (hypoxia with sats 70's). She was tried on bipap with some improvement. CXR revealed a large R ptx with complete collapse. CVTS placed R CT at [place] tx to [facility] ICU for further treatment.  Significant Events: 3/2 - elective myomectomy at [facility] 3/2 - emergent CT placement per TCTS - transfer to [facility] ICU  HPI/Subjective: The patient is resting comfortably in bed.  She denies significant shortness of breath chest pain nausea or vomiting.  She has not noticed an increase in her menstrual flow since Lovenox has been initiated.  Assessment/Plan:  Acute hypoxic respiratory failure - Complete R PTX Care of CT per TCTS - f/u CXR pending   Pulm Edema S/p re-expansion of R lung - cont diuresis - follow serial CXR - net negative ~2.2L thus far   Chronic grade 2 Diastolic CHF w/ acute exacerbation  scheduled diuresis continues   Leukocytosis Perhaps simply stress related - no other clinical signs to suggest infection - follow temp curve and WBC - f/u CXR pending today   OHS  Followed by Halford Chessman as outpt - did not actually have apnea on sleep study therefore CPAP not indicated    DM CBG reasonably controlled   HTN BP reasonably controlled   Hx bipolar disorder  Cont usual home meds   Hx VP shunt   Uterine fibroids- Menorrhagia - s/p hysterocopic myomectomy  Post op care per OB/GYN (see their note of 02/17/16) - no signif increase in blood loss s/p initiation of lovenox   Morbid obesity - Body mass index is 38.39  kg/(m^2).  Code Status: FULL Family Communication: no family present at time of exam Disposition Plan: ambulate   Consultants: TCTS PCCM  Antibiotics: none  DVT prophylaxis: lovenox  Objective: Blood pressure 105/49, pulse 95, temperature 99.5 F (37.5 C), temperature source Oral, resp. rate 18, height 4\' 9"  (1.448 m), weight 80.5 kg (177 lb 7.5 oz), last menstrual period 02/09/2016, SpO2 98 %.  Intake/Output Summary (Last 24 hours) at 02/19/16 0731 Last data filed at 02/18/16 2258  Gross per 24 hour  Intake    450 ml  Output   2300 ml  Net  -1850 ml   Exam: General: No acute respiratory distress at rest in bed Lungs: Clear to auscultation bilaterally without wheezes  Cardiovascular: Regular rate and rhythm without murmur gallop or rub Abdomen: Nontender, nondistended, soft, bowel sounds positive Extremities: No significant cyanosis, clubbing, or edema bilateral lower extremities  Data Reviewed: Basic Metabolic Panel:  Recent Labs Lab 02/15/16 0930 02/19/16 0441  NA 138 137  K 3.7 4.1  CL 102 95*  CO2 28 34*  GLUCOSE 162* 140*  BUN 18 13  CREATININE 0.67 0.60  CALCIUM 8.8* 9.1    CBC:  Recent Labs Lab 02/15/16 0930 02/19/16 0441  WBC 9.2 14.1*  HGB 12.2 10.7*  HCT 39.6 35.4*  MCV 84.4 87.8  PLT 384 379    Liver Function Tests: No results for input(s): AST, ALT, ALKPHOS, BILITOT, PROT, ALBUMIN in the last 168 hours. No results for input(s): LIPASE, AMYLASE in the last 168 hours. No results for  input(s): AMMONIA in the last 168 hours.  CBG:  Recent Labs Lab 02/18/16 0007 02/18/16 0341 02/18/16 0841 02/18/16 1558 02/18/16 1954  GLUCAP 97 123* 135* 157* 151*    Recent Results (from the past 240 hour(s))  MRSA PCR Screening     Status: None   Collection Time: 02/17/16  5:37 PM  Result Value Ref Range Status   MRSA by PCR NEGATIVE NEGATIVE Final    Comment:        The GeneXpert MRSA Assay (FDA approved for NASAL specimens only), is  one component of a comprehensive MRSA colonization surveillance program. It is not intended to diagnose MRSA infection nor to guide or monitor treatment for MRSA infections.      Studies:   Recent x-ray studies have been reviewed in detail by the Attending Physician  Scheduled Meds:  Scheduled Meds: . amLODipine  5 mg Oral Daily  . antiseptic oral rinse  7 mL Mouth Rinse BID  . divalproex  500 mg Oral Daily  . enoxaparin (LOVENOX) injection  40 mg Subcutaneous Q24H  . lamoTRIgine  100 mg Oral Daily    Time spent on care of this patient: 25 mins   [facility] T , MD   [facility] Office  [phone] Pager - Text Page per Shea Evans as per below:  On-Call/Text Page:      [email or web address]      password [email or web address]  If 7PM-7AM, please contact night-coverage [email or web address] Password [email or web address] 02/19/2016, 7:31 AM   LOS: 2 days

## 2016-02-20 ENCOUNTER — Inpatient Hospital Stay (HOSPITAL_COMMUNITY): Payer: Federal, State, Local not specified - PPO

## 2016-02-20 DIAGNOSIS — N390 Urinary tract infection, site not specified: Secondary | ICD-10-CM

## 2016-02-20 LAB — COMPREHENSIVE METABOLIC PANEL
ALT: 12 U/L — AB (ref 14–54)
AST: 12 U/L — AB (ref 15–41)
Albumin: 2.8 g/dL — ABNORMAL LOW (ref 3.5–5.0)
Alkaline Phosphatase: 55 U/L (ref 38–126)
Anion gap: 8 (ref 5–15)
BUN: 14 mg/dL (ref 6–20)
CHLORIDE: 97 mmol/L — AB (ref 101–111)
CO2: 35 mmol/L — AB (ref 22–32)
CREATININE: 0.59 mg/dL (ref 0.44–1.00)
Calcium: 9 mg/dL (ref 8.9–10.3)
GFR calc Af Amer: >60 mL/min (ref >60)
GFR calc non Af Amer: >60 mL/min (ref >60)
Glucose, Bld: 139 mg/dL — ABNORMAL HIGH (ref 65–99)
POTASSIUM: 4 mmol/L (ref 3.5–5.1)
SODIUM: 140 mmol/L (ref 135–145)
Total Bilirubin: 0.9 mg/dL (ref 0.3–1.2)
Total Protein: 6.3 g/dL — ABNORMAL LOW (ref 6.5–8.1)

## 2016-02-20 LAB — CBC
HCT: 35.2 % — ABNORMAL LOW (ref 36.0–46.0)
Hemoglobin: 9.9 g/dL — ABNORMAL LOW (ref 12.0–15.0)
MCH: 25 pg — ABNORMAL LOW (ref 26.0–34.0)
MCHC: 28.1 g/dL — ABNORMAL LOW (ref 30.0–36.0)
MCV: 88.9 fL (ref 78.0–100.0)
PLATELETS: 372 10*3/uL (ref 150–400)
RBC: 3.96 MIL/uL (ref 3.87–5.11)
RDW: 15.2 % (ref 11.5–15.5)
WBC: 12.7 10*3/uL — AB (ref 4.0–10.5)

## 2016-02-20 LAB — GLUCOSE, CAPILLARY
GLUCOSE-CAPILLARY: 121 mg/dL — AB (ref 65–99)
GLUCOSE-CAPILLARY: 155 mg/dL — AB (ref 65–99)
GLUCOSE-CAPILLARY: 113 mg/dL — AB (ref 65–99)
Glucose-Capillary: 155 mg/dL — ABNORMAL HIGH (ref 65–99)

## 2016-02-20 MED ORDER — DEXTROSE 5 % IV SOLN
1.0000 g | INTRAVENOUS | Status: DC
  Filled 2016-02-20: qty 10

## 2016-02-20 MED ORDER — PNEUMOCOCCAL VAC POLYVALENT 25 MCG/0.5ML IJ INJ
0.5000 mL | INJECTION | INTRAMUSCULAR | Status: AC
  Administered 2016-02-22: 0.5 mL via INTRAMUSCULAR

## 2016-02-20 MED ORDER — DEXTROSE 5 % IV SOLN
1.0000 g | INTRAVENOUS | Status: DC
  Administered 2016-02-20 – 2016-02-22 (×3): 1 g via INTRAVENOUS
  Filled 2016-02-20 (×3): qty 10

## 2016-02-20 NOTE — Progress Notes (Signed)
[place] TEAM 1 - Stepdown/ICU TEAM PROGRESS NOTE  [name] [id number] DOB: 1971/08/24 DOA: 02/17/2016 PCP: Horatio Pel, MD  Admit HPI / Brief Narrative: 45yo obese female with hx bipolar disorder, HTN, DM, OHS, dCHF, and uterine fibroids who presented to [facility] 3/2 for elective myomectomy. She was a very difficult intubation per anesthesia. She was extubated post op with subsequent respiratory distress (hypoxia with sats 70's). She was tried on bipap with some improvement. CXR revealed a large R ptx with complete collapse. CVTS placed R CT at [place] tx to [facility] ICU for further treatment.  Significant Events: 3/2 - elective myomectomy at [facility] 3/2 - emergent CT placement per TCTS - transfer to [place] ICU  HPI/Subjective: The pt had a temp of 101.4 this morning.  She denies any localizing sx.  She denies sob, cough, or congestion.  No abdom pain or cramps.    Assessment/Plan:  Acute hypoxic respiratory failure - Complete R PTX Care of CT per TCTS   Pulm Edema - persisting R basilar and perihilar infiltrates S/p re-expansion of R lung - cont diuresis - persisting ?infiltrates on serial CXR despite diuresis, but old CXRs suggest this is a chronic finding - keep PNA in differential but will only tx for UTI for now and follow   UTI initiate empiric abx - follow culture - remove foley cath   Hx of Chronic grade 2 Diastolic CHF  scheduled diuresis continues - net negative ~3.4L thus far - weight decreasing (see below) - TTE 02/19/16 noted EF [id number], normal diastolic fxn, and no WMA  OHS  Followed by Halford Chessman as outpt - did not actually have apnea on sleep study therefore CPAP not indicated    DM CBG reasonably controlled   HTN BP controlled   Hx bipolar disorder  Cont usual home meds   Hx VP shunt   Uterine fibroids- Menorrhagia - s/p hysterocopic myomectomy  Post op care per OB/GYN (see their note of 02/17/16) - no signif  increase in blood loss s/p initiation of lovenox   Obesity - Body mass index is 37.77 kg/(m^2).  Code Status: FULL Family Communication: no family present at time of exam Disposition Plan: PT/OT - chest tube per TCTS - d/c foley  Consultants: TCTS PCCM  Antibiotics: none  DVT prophylaxis: lovenox  Objective: Blood pressure 135/73, pulse 97, temperature 98 F (36.7 C), temperature source Oral, resp. rate 16, height 4\' 9"  (1.448 m), weight 79.2 kg (174 lb 9.7 oz), last menstrual period 02/09/2016, SpO2 97 %.  Intake/Output Summary (Last 24 hours) at 02/20/16 1123 Last data filed at 02/19/16 2100  Gross per 24 hour  Intake    120 ml  Output    950 ml  Net   -830 ml   Filed Weights   02/18/16 0500 02/18/16 2322 02/20/16 0609  Weight: 80.2 kg (176 lb 12.9 oz) 80.5 kg (177 lb 7.5 oz) 79.2 kg (174 lb 9.7 oz)   Exam: General: No acute respiratory distress  Lungs: Clear to auscultation bilaterally - no crackles or wheeze  Cardiovascular: Regular rate and rhythm without murmur  Abdomen: Nontender, nondistended, soft, bowel sounds positive Extremities: No significant cyanosis, clubbing, edema bilateral lower extremities  Data Reviewed: Basic Metabolic Panel:  Recent Labs Lab 02/15/16 0930 02/19/16 0441 02/20/16 0334  NA 138 137 140  K 3.7 4.1 4.0  CL 102 95* 97*  CO2 28 34* 35*  GLUCOSE 162* 140* 139*  BUN 18 13 14   CREATININE 0.67  0.60 0.59  CALCIUM 8.8* 9.1 9.0    CBC:  Recent Labs Lab 02/15/16 0930 02/19/16 0441 02/20/16 0334  WBC 9.2 14.1* 12.7*  HGB 12.2 10.7* 9.9*  HCT 39.6 35.4* 35.2*  MCV 84.4 87.8 88.9  PLT 384 379 372    Liver Function Tests:  Recent Labs Lab 02/20/16 0334  AST 12*  ALT 12*  ALKPHOS 55  BILITOT 0.9  PROT 6.3*  ALBUMIN 2.8*   CBG:  Recent Labs Lab 02/18/16 1954 02/19/16 1124 02/19/16 1617 02/19/16 2142 02/20/16 0607  GLUCAP 151* 126* 172* 120* 155*    Recent Results (from the past 240 hour(s))  MRSA PCR  Screening     Status: None   Collection Time: 02/17/16  5:37 PM  Result Value Ref Range Status   MRSA by PCR NEGATIVE NEGATIVE Final    Comment:        The GeneXpert MRSA Assay (FDA approved for NASAL specimens only), is one component of a comprehensive MRSA colonization surveillance program. It is not intended to diagnose MRSA infection nor to guide or monitor treatment for MRSA infections.      Studies:   Recent x-ray studies have been reviewed in detail by the Attending Physician  Scheduled Meds:  Scheduled Meds: . amLODipine  5 mg Oral Daily  . antiseptic oral rinse  7 mL Mouth Rinse BID  . cefTRIAXone (ROCEPHIN)  IV  1 g Intravenous Q24H  . divalproex  500 mg Oral Daily  . enoxaparin (LOVENOX) injection  40 mg Subcutaneous Q24H  . furosemide  40 mg Oral BID  . insulin aspart  0-5 Units Subcutaneous QHS  . insulin aspart  0-9 Units Subcutaneous TID WC  . irbesartan  150 mg Oral Daily  . lamoTRIgine  100 mg Oral Daily  . potassium chloride  20 mEq Oral Daily    Time spent on care of this patient: 35 mins   Tyheem Boughner T , MD   [facility] Office  [phone] Pager - Text Page per Shea Evans as per below:  On-Call/Text Page:      [email or web address]      password [email or web address]  If 7PM-7AM, please contact night-coverage [email or web address] Password [email or web address] 02/20/2016, 11:23 AM   LOS: 3 days

## 2016-02-20 NOTE — Anesthesia Postprocedure Evaluation (Addendum)
Anesthesia Post Note  Patient: [name]  Procedure(s) Performed: Procedure(s) (LRB): DILATATION & CURETTAGE/HYSTEROSCOPY/Myomectomy WITH MYOSURE (N/A) [place] (N/A)  Patient location during evaluation: Nursing Unit Level of consciousness: awake and alert Pain management: pain level controlled Vital Signs Assessment: post-procedure vital signs reviewed and stable Respiratory status: spontaneous breathing and patient connected to nasal cannula oxygen Cardiovascular status: stable Postop Assessment: no signs of nausea or vomiting Anesthetic complications: no    Last Vitals:  Filed Vitals:   02/20/16 0946 02/20/16 1343  BP:  102/58  Pulse:  94  Temp: 36.7 C 37 C  Resp:  16    Last Pain:  Filed Vitals:   02/20/16 1344  PainSc: 0-No pain                 Linsay Vogt

## 2016-02-20 NOTE — Progress Notes (Addendum)
      [address]       [place] [address]             [phone]       3 Days Post-Op Procedure(s) (LRB): DILATATION & CURETTAGE/HYSTEROSCOPY/Myomectomy WITH MYOSURE (N/A) MYOMECTOMY WITH MYOSURE (N/A)  Subjective: Patient has some incisional pain on right side  Objective: Vital signs in last 24 hours: Temp:  [99 F (37.2 C)-101.4 F (38.6 C)] 101.4 F (38.6 C) (03/05 0609) Pulse Rate:  [86-97] 97 (03/05 0609) Cardiac Rhythm:  [-] Normal sinus rhythm (03/04 2100) Resp:  [16-18] 16 (03/05 0609) BP: (113-135)/(56-73) 135/73 mmHg (03/05 0609) SpO2:  [97 %-98 %] 97 % (03/05 0609) Weight:  [174 lb 9.7 oz (79.2 kg)] 174 lb 9.7 oz (79.2 kg) (03/05 0609)     Intake/Output from previous day: 03/04 0701 - 03/05 0700 In: 120 [P.O.:120] Out: 1350 [Urine:1350]   Physical Exam:  Cardiovascular: RRR Pulmonary: Slightly diminished at bases; no rales, wheezes, or rhonchi. Chest Tube: to water seal, no air leak  Lab Results: CBC:  Recent Labs  02/19/16 0441 02/20/16 0334  WBC 14.1* 12.7*  HGB 10.7* 9.9*  HCT 35.4* 35.2*  PLT 379 372   BMET:   Recent Labs  02/19/16 0441 02/20/16 0334  NA 137 140  K 4.1 4.0  CL 95* 97*  CO2 34* 35*  GLUCOSE 140* 139*  BUN 13 14  CREATININE 0.60 0.59  CALCIUM 9.1 9.0    PT/INR: No results for input(s): LABPROT, INR in the last 72 hours. ABG:  INR: Will add last result for INR, ABG once components are confirmed Will add last 4 CBG results once components are confirmed  Assessment/Plan:  1. CV - SR. On Norvasc 5 mg daily. Echo done yesterday showed LVEF 65-70%, no regional wall abnormalities,  2.  Pulmonary - S/p right chest tube placement 3/2. Chest tube with scant output and is to water seal. There is no air leak. CXR appears stable. Likely remove chest tube. Check CXR in am. 3. Volume overload/CHF-on Lasix 40 mg po bid 4. H and H this am 9.9 and 35.2 5. Fever to 101.4. WBC decreased to 12,700 this am.  Still with foley and some consolidation on right lung base. Per medicine.  ZIMMERMAN,DONIELLE MPA-C 02/20/2016,8:49 AM    Chart reviewed, patient examined, agree with above. Chest tube removed. Follow up CXR pending.

## 2016-02-20 NOTE — Progress Notes (Signed)
Chest tube removed.  Pt tol well. Suture tightened and covered with petroleum gauze and pressure dressing.  Will cont to monitor.

## 2016-02-20 NOTE — Progress Notes (Signed)
UR Completed. Dossie Swor, RN, BSN.  [phone] 

## 2016-02-21 ENCOUNTER — Encounter (HOSPITAL_COMMUNITY): Payer: Self-pay | Admitting: Radiology

## 2016-02-21 ENCOUNTER — Inpatient Hospital Stay (HOSPITAL_COMMUNITY): Payer: Federal, State, Local not specified - PPO

## 2016-02-21 DIAGNOSIS — R918 Other nonspecific abnormal finding of lung field: Secondary | ICD-10-CM

## 2016-02-21 LAB — BASIC METABOLIC PANEL
Anion gap: 11 (ref 5–15)
BUN: 11 mg/dL (ref 6–20)
CHLORIDE: 92 mmol/L — AB (ref 101–111)
CO2: 38 mmol/L — ABNORMAL HIGH (ref 22–32)
CREATININE: 0.6 mg/dL (ref 0.44–1.00)
Calcium: 9.1 mg/dL (ref 8.9–10.3)
Glucose, Bld: 122 mg/dL — ABNORMAL HIGH (ref 65–99)
POTASSIUM: 4.2 mmol/L (ref 3.5–5.1)
SODIUM: 141 mmol/L (ref 135–145)

## 2016-02-21 LAB — GLUCOSE, CAPILLARY
GLUCOSE-CAPILLARY: 139 mg/dL — AB (ref 65–99)
GLUCOSE-CAPILLARY: 110 mg/dL — AB (ref 65–99)
GLUCOSE-CAPILLARY: 115 mg/dL — AB (ref 65–99)
Glucose-Capillary: 172 mg/dL — ABNORMAL HIGH (ref 65–99)

## 2016-02-21 LAB — INFLUENZA PANEL BY PCR (TYPE A & B)
H1N1 flu by pcr: NOT DETECTED
INFLAPCR: NEGATIVE
Influenza B By PCR: NEGATIVE

## 2016-02-21 LAB — CBC
HCT: 37.5 % (ref 36.0–46.0)
HEMOGLOBIN: 10.5 g/dL — AB (ref 12.0–15.0)
MCH: 24.8 pg — ABNORMAL LOW (ref 26.0–34.0)
MCHC: 28 g/dL — AB (ref 30.0–36.0)
MCV: 88.7 fL (ref 78.0–100.0)
PLATELETS: 415 10*3/uL — AB (ref 150–400)
RBC: 4.23 MIL/uL (ref 3.87–5.11)
RDW: 15.1 % (ref 11.5–15.5)
WBC: 11 10*3/uL — ABNORMAL HIGH (ref 4.0–10.5)

## 2016-02-21 LAB — URINE CULTURE: Culture: NO GROWTH

## 2016-02-21 MED ORDER — IOHEXOL 300 MG/ML  SOLN
100.0000 mL | Freq: Once | INTRAMUSCULAR | Status: AC | PRN
  Administered 2016-02-21: 100 mL via INTRAVENOUS

## 2016-02-21 MED ORDER — FUROSEMIDE 20 MG PO TABS
20.0000 mg | ORAL_TABLET | Freq: Two times a day (BID) | ORAL | Status: DC
  Administered 2016-02-21 – 2016-02-23 (×4): 20 mg via ORAL
  Filled 2016-02-21 (×4): qty 1

## 2016-02-21 NOTE — Progress Notes (Signed)
      [address]       [place] [address]             [phone]       4 Days Post-Op Procedure(s) (LRB): DILATATION & CURETTAGE/HYSTEROSCOPY/Myomectomy WITH MYOSURE (N/A) MYOMECTOMY WITH MYOSURE (N/A)  Subjective: Patient states breathing is "ok"  Objective: Vital signs in last 24 hours: Temp:  [98.4 F (36.9 C)-101.5 F (38.6 C)] 98.4 F (36.9 C) (03/06 0850) Pulse Rate:  [94-99] 96 (03/06 0850) Cardiac Rhythm:  [-] Normal sinus rhythm (03/06 1141) Resp:  [14-16] 16 (03/06 0850) BP: (102-135)/(58-87) 121/87 mmHg (03/06 0850) SpO2:  [92 %-100 %] 100 % (03/06 0850) Weight:  [174 lb 13.2 oz (79.3 kg)] 174 lb 13.2 oz (79.3 kg) (03/06 0528)     Intake/Output from previous day: 03/05 0701 - 03/06 0700 In: 240 [P.O.:240] Out: -    Physical Exam:  Cardiovascular: RRR Pulmonary: Slightly diminished at bases; no rales, wheezes, or rhonchi. Wound: Dressing is clean and dry  Lab Results: CBC:  Recent Labs  02/20/16 0334 02/21/16 0218  WBC 12.7* 11.0*  HGB 9.9* 10.5*  HCT 35.2* 37.5  PLT 372 415*   BMET:   Recent Labs  02/20/16 0334 02/21/16 0218  NA 140 141  K 4.0 4.2  CL 97* 92*  CO2 35* 38*  GLUCOSE 139* 122*  BUN 14 11  CREATININE 0.59 0.60  CALCIUM 9.0 9.1    PT/INR: No results for input(s): LABPROT, INR in the last 72 hours. ABG:  INR: Will add last result for INR, ABG once components are confirmed Will add last 4 CBG results once components are confirmed  Assessment/Plan:  1. CV - SR. On Norvasc 5 mg daily and Avapro 150 mg daily. Echo done yesterday showed LVEF 65-70%, no regional wall abnormalities,  2.  Pulmonary - S/p right chest tube placement 3/2. Chest tube removed yesterday. CXR appears stable. Encourage incentive spirometer and flutter valve. 3. Volume overload/CHF-on Lasix 20 mg po bid 4. H and H this am 10.5 and 37.5 5. Fever to 101.5 last evening. WBC decreased to 11,000 this am. CT results noted. Has  atelectasis but no evidence of PNA.  On Rocephin.  Zaray Gatchel MPA-C 02/21/2016,1:03 PM

## 2016-02-21 NOTE — Progress Notes (Signed)
[facility] TEAM 1 - Stepdown/ICU TEAM PROGRESS NOTE  [name] [id number] DOB: 1970/12/27 DOA: 02/17/2016 PCP: Horatio Pel, MD  Admit HPI / Brief Narrative: 45yo obese female with hx bipolar disorder, HTN, DM, OHS, dCHF, and uterine fibroids who presented to [facility] 3/2 for elective myomectomy. She was a very difficult intubation per anesthesia. She was extubated post op with subsequent respiratory distress (hypoxia with sats 70's). She was tried on bipap with some improvement. CXR revealed a large R ptx with complete collapse. CVTS placed R CT at [place] tx to [facility] ICU for further treatment.  Significant Events: 3/2 - elective myomectomy at [facility] 3/2 - emergent CT placement per TCTS - transfer to [facility] ICU  HPI/Subjective: The pt had a temp of 101.5 last night. She is resting comfortably this morning.  She has no complaints, but continues to require O2 support.  She denies cp, n/v, or abdom pain.      Assessment/Plan:  Acute hypoxic respiratory failure - Complete R PTX Chest tube removed yesterday per TCTS - f/u CXR today w/o ptx  Persisting R basilar and perihilar infiltrates persisting ?infiltrates on serial CXR despite diuresis, but old CXRs suggest this is a chronic finding - unclear to me why this relatively young pt would have these chronic lung findings - will obtain CT chest to better define parenchyma - wean O2 as able   UTI initiate empiric abx - culture w/o growth - removed foley cath   Reported Hx of Chronic grade 2 Diastolic CHF - No evidence presently  scheduled diuresis continues - net negative ~3.2L thus far - weight decreasing - TTE 02/19/16 noted EF Q000111Q, normal diastolic fxn, and no WMA Filed Weights   02/18/16 2322 02/20/16 0609 02/21/16 0528  Weight: 80.5 kg (177 lb 7.5 oz) 79.2 kg (174 lb 9.7 oz) 79.3 kg (174 lb 13.2 oz)   OHS  Followed by Half[facility] Chessman as outpt - did not actually have apnea on sleep study  therefore CPAP not indicated    DM CBG reasonably controlled - no change in tx plan today   HTN  BP controlled   Hx bipolar disorder  Cont usual home meds   Hx VP shunt   Uterine fibroids- Menorrhagia - s/p hysterocopic myomectomy  Post op care per OB/GYN (see their note of 02/17/16) - no signif increase in blood loss s/p initiation of lovenox   Obesity - Body mass index is 37.82 kg/(m^2).  Code Status: FULL Family Communication: no family present at time of exam Disposition Plan: PT/OT - wean O2 - f/u CT chest   Consultants: TCTS PCCM  Antibiotics: none  DVT prophylaxis: lovenox  Objective: Blood pressure 135/67, pulse 95, temperature 99.3 F (37.4 C), temperature source Oral, resp. rate 14, height 4\' 9"  (1.448 m), weight 79.3 kg (174 lb 13.2 oz), last menstrual period 02/09/2016, SpO2 97 %.  Intake/Output Summary (Last 24 hours) at 02/21/16 0830 Last data filed at 02/20/16 1700  Gross per 24 hour  Intake    240 ml  Output      0 ml  Net    240 ml   Filed Weights   02/18/16 2322 02/20/16 0609 02/21/16 0528  Weight: 80.5 kg (177 lb 7.5 oz) 79.2 kg (174 lb 9.7 oz) 79.3 kg (174 lb 13.2 oz)   Exam: General: No acute respiratory distress but still requiring [place] oxygen Lungs: fine crackles B lower fields - no wheeze  Cardiovascular: Regular rate and rhythm - no murmur  Abdomen: Nontender, nondistended, soft, bowel sounds positive Extremities: No significant cyanosis, clubbing, or edema bilateral lower extremities  Data Reviewed: Basic Metabolic Panel:  Recent Labs Lab 02/15/16 0930 02/19/16 0441 02/20/16 0334 02/21/16 0218  NA 138 137 140 141  K 3.7 4.1 4.0 4.2  CL 102 95* 97* 92*  CO2 28 34* 35* 38*  GLUCOSE 162* 140* 139* 122*  BUN 18 13 14 11   CREATININE 0.67 0.60 0.59 0.60  CALCIUM 8.8* 9.1 9.0 9.1    CBC:  Recent Labs Lab 02/15/16 0930 02/19/16 0441 02/20/16 0334 02/21/16 0218  WBC 9.2 14.1* 12.7* 11.0*  HGB 12.2 10.7* 9.9* 10.5*  HCT  39.6 35.4* 35.2* 37.5  MCV 84.4 87.8 88.9 88.7  PLT 384 379 372 415*    Liver Function Tests:  Recent Labs Lab 02/20/16 0334  AST 12*  ALT 12*  ALKPHOS 55  BILITOT 0.9  PROT 6.3*  ALBUMIN 2.8*   CBG:  Recent Labs Lab 02/19/16 2142 02/20/16 0607 02/20/16 1117 02/20/16 1610 02/20/16 2100  GLUCAP 120* 155* 121* 155* 113*    Recent Results (from the past 240 hour(s))  MRSA PCR Screening     Status: None   Collection Time: 02/17/16  5:37 PM  Result Value Ref Range Status   MRSA by PCR NEGATIVE NEGATIVE Final    Comment:        The GeneXpert MRSA Assay (FDA approved for NASAL specimens only), is one component of a comprehensive MRSA colonization surveillance program. It is not intended to diagnose MRSA infection nor to guide or monitor treatment for MRSA infections.   Culture, Urine     Status: None (Preliminary result)   Collection Time: 02/19/16  4:57 PM  Result Value Ref Range Status   Specimen Description URINE, CATHETERIZED  Final   Special Requests NONE  Final   Culture NO GROWTH < 24 HOURS  Final   Report Status PENDING  Incomplete     Studies:   Recent x-ray studies have been reviewed in detail by the Attending Physician  Scheduled Meds:  Scheduled Meds: . amLODipine  5 mg Oral Daily  . antiseptic oral rinse  7 mL Mouth Rinse BID  . cefTRIAXone (ROCEPHIN)  IV  1 g Intravenous Q24H  . divalproex  500 mg Oral Daily  . enoxaparin (LOVENOX) injection  40 mg Subcutaneous Q24H  . furosemide  40 mg Oral BID  . insulin aspart  0-5 Units Subcutaneous QHS  . insulin aspart  0-9 Units Subcutaneous TID WC  . irbesartan  150 mg Oral Daily  . lamoTRIgine  100 mg Oral Daily  . pneumococcal 23 valent vaccine  0.5 mL Intramuscular Tomorrow-1000  . potassium chloride  20 mEq Oral Daily    Time spent on care of this patient: 35 mins   Shigeru Lampert T , MD   [facility] Office  [phone] Pager - Text Page per Shea Evans as per  below:  On-Call/Text Page:      [email or web address]      password [email or web address]  If 7PM-7AM, please contact night-coverage [email or web address] Password [email or web address] 02/21/2016, 8:30 AM   LOS: 4 days

## 2016-02-22 DIAGNOSIS — E118 Type 2 diabetes mellitus with unspecified complications: Secondary | ICD-10-CM

## 2016-02-22 DIAGNOSIS — F3113 Bipolar disorder, current episode manic without psychotic features, severe: Secondary | ICD-10-CM

## 2016-02-22 DIAGNOSIS — N390 Urinary tract infection, site not specified: Secondary | ICD-10-CM | POA: Insufficient documentation

## 2016-02-22 DIAGNOSIS — I10 Essential (primary) hypertension: Secondary | ICD-10-CM | POA: Insufficient documentation

## 2016-02-22 DIAGNOSIS — E662 Morbid (severe) obesity with alveolar hypoventilation: Secondary | ICD-10-CM

## 2016-02-22 DIAGNOSIS — D259 Leiomyoma of uterus, unspecified: Secondary | ICD-10-CM | POA: Insufficient documentation

## 2016-02-22 DIAGNOSIS — O3410 Maternal care for benign tumor of corpus uteri, unspecified trimester: Secondary | ICD-10-CM

## 2016-02-22 DIAGNOSIS — J939 Pneumothorax, unspecified: Secondary | ICD-10-CM | POA: Insufficient documentation

## 2016-02-22 DIAGNOSIS — F319 Bipolar disorder, unspecified: Secondary | ICD-10-CM | POA: Insufficient documentation

## 2016-02-22 DIAGNOSIS — I5032 Chronic diastolic (congestive) heart failure: Secondary | ICD-10-CM | POA: Insufficient documentation

## 2016-02-22 DIAGNOSIS — R0902 Hypoxemia: Secondary | ICD-10-CM

## 2016-02-22 LAB — CBC
HCT: 37.5 % (ref 36.0–46.0)
Hemoglobin: 10.4 g/dL — ABNORMAL LOW (ref 12.0–15.0)
MCH: 24.8 pg — ABNORMAL LOW (ref 26.0–34.0)
MCHC: 27.7 g/dL — AB (ref 30.0–36.0)
MCV: 89.5 fL (ref 78.0–100.0)
Platelets: 452 10*3/uL — ABNORMAL HIGH (ref 150–400)
RBC: 4.19 MIL/uL (ref 3.87–5.11)
RDW: 15 % (ref 11.5–15.5)
WBC: 10.1 10*3/uL (ref 4.0–10.5)

## 2016-02-22 LAB — GLUCOSE, CAPILLARY
GLUCOSE-CAPILLARY: 133 mg/dL — AB (ref 65–99)
GLUCOSE-CAPILLARY: 164 mg/dL — AB (ref 65–99)
Glucose-Capillary: 129 mg/dL — ABNORMAL HIGH (ref 65–99)
Glucose-Capillary: 138 mg/dL — ABNORMAL HIGH (ref 65–99)

## 2016-02-22 LAB — LIPID PANEL
CHOL/HDL RATIO: 4.1 ratio
Cholesterol: 188 mg/dL (ref 0–200)
HDL: 46 mg/dL (ref >40)
LDL Cholesterol: 124 mg/dL — ABNORMAL HIGH (ref 0–99)
TRIGLYCERIDES: 89 mg/dL (ref <150)
VLDL: 18 mg/dL (ref 0–40)

## 2016-02-22 MED ORDER — CEFUROXIME AXETIL 500 MG PO TABS
250.0000 mg | ORAL_TABLET | Freq: Two times a day (BID) | ORAL | Status: DC
  Administered 2016-02-23: 250 mg via ORAL
  Filled 2016-02-22: qty 1

## 2016-02-22 NOTE — Progress Notes (Signed)
[address]       [place] [address]             [phone]      5 Days Post-Op Procedure(s) (LRB): DILATATION & CURETTAGE/HYSTEROSCOPY/Myomectomy WITH MYOSURE (N/A) MYOMECTOMY WITH MYOSURE (N/A) Subjective: Feels ok, breathing is comfortable  Objective: Vital signs in last 24 hours: Temp:  [98.4 F (36.9 C)-98.8 F (37.1 C)] 98.8 F (37.1 C) (03/07 0527) Pulse Rate:  [85-96] 87 (03/07 0527) Cardiac Rhythm:  [-] Normal sinus rhythm (03/06 2100) Resp:  [16-18] 18 (03/07 0527) BP: (121-126)/(65-87) 124/78 mmHg (03/07 0527) SpO2:  [75 %-100 %] 100 % (03/07 0527) Weight:  [166 lb 10.7 oz (75.6 kg)] 166 lb 10.7 oz (75.6 kg) (03/07 0527)  Hemodynamic parameters for last 24 hours:    Intake/Output from previous day: 03/06 0701 - 03/07 0700 In: 460 [P.O.:460] Out: -  Intake/Output this shift:    General appearance: alert, cooperative and no distress Heart: regular rate and rhythm Lungs: clear to auscultation bilaterally Abdomen: benign Extremities: no edema  Lab Results:  Recent Labs  02/21/16 0218 02/22/16 0252  WBC 11.0* 10.1  HGB 10.5* 10.4*  HCT 37.5 37.5  PLT 415* 452*   BMET:  Recent Labs  02/20/16 0334 02/21/16 0218  NA 140 141  K 4.0 4.2  CL 97* 92*  CO2 35* 38*  GLUCOSE 139* 122*  BUN 14 11  CREATININE 0.59 0.60  CALCIUM 9.0 9.1    PT/INR: No results for input(s): LABPROT, INR in the last 72 hours. ABG    Component Value Date/Time   PHART 7.290* 02/17/2016 1806   HCO3 29.1* 02/17/2016 1806   TCO2 31 02/17/2016 1806   O2SAT 90.0 02/17/2016 1806   CBG (last 3)   Recent Labs  02/21/16 1649 02/21/16 2124 02/22/16 0628  GLUCAP 172* 115* 133*    Meds Scheduled Meds: . amLODipine  5 mg Oral Daily  . antiseptic oral rinse  7 mL Mouth Rinse BID  . cefTRIAXone (ROCEPHIN)  IV  1 g Intravenous Q24H  . divalproex  500 mg Oral Daily  . enoxaparin (LOVENOX) injection  40 mg Subcutaneous Q24H  . furosemide  20 mg  Oral BID  . insulin aspart  0-5 Units Subcutaneous QHS  . insulin aspart  0-9 Units Subcutaneous TID WC  . irbesartan  150 mg Oral Daily  . lamoTRIgine  100 mg Oral Daily  . pneumococcal 23 valent vaccine  0.5 mL Intramuscular Tomorrow-1000  . potassium chloride  20 mEq Oral Daily   Continuous Infusions:  PRN Meds:.acetaminophen, oxyCODONE, traMADol  Xrays Dg Chest 2 View  02/21/2016  CLINICAL DATA:  Shortness of breath, previous pneumothorax with chest tube treatment EXAM: CHEST  2 VIEW COMPARISON:  Portable chest x-ray of February 20, 2016 FINDINGS: There has been interval removal of the right-sided chest tube. There is no new residual pneumothorax and no pleural effusion. The interstitial markings are mildly in the right middle and lower lobes but appear stable. The heart is normal in size. The pulmonary vascularity is not engorged. The right internal jugular venous catheter tip projects over the midportion of the SVC. There is mild degenerative disc disease of the thoracic spine. IMPRESSION: No residual pneumothorax or pleural effusion on the right since chest tube removal. Stable mildly increased interstitial density in the right mid and lower lung. Electronically Signed   By: David  Martinique M.D.   On: 02/21/2016 07:19   Ct Chest W  Contrast  02/21/2016  CLINICAL DATA:  Pulmonary infiltrates. Bibasilar pulmonary infiltrates dating back many years with no explanation in med hx - had a PTX following positive pressure during elective GYN surgery. Hx HTN, diabetes, and pneumonia. EXAM: CT CHEST WITH CONTRAST TECHNIQUE: Multidetector CT imaging of the chest was performed during intravenous contrast administration. CONTRAST:  140mL OMNIPAQUE IOHEXOL 300 MG/ML  SOLN COMPARISON:  Chest CT, 03/31/2013 FINDINGS: Neck base and axilla: No mass or adenopathy. Visualized thyroid is unremarkable. Mediastinum and hila: Heart mildly enlarged. Stable dilation of the main pulmonary artery to 3.1 cm. Mildly enlarged  precarinal lymph node measures 11 mm in short axis increased from the prior CT. No other mediastinal adenopathy. No mediastinal masses. No hilar masses or discrete enlarged lymph nodes. Lungs and pleura: On the right, there is opacity that extends from the right lateral, inferior upper lobe at the minor fissure superiorly adjacent to the oblique fissure to near the left apex. This is likely atelectasis along the tract of the recently removed right chest tube. There is a minimal right pleural effusion. Patchy and linear/reticular opacity is noted in the right lower lobe posteriorly and in the posterior lateral right upper lobe, likely atelectasis. There is an area of linear type opacity in the left lower lobe that is also likely atelectasis. Some of this may be scarring. There is no convincing pneumonia. No evidence of pulmonary edema. Airways are patent. No pneumothorax. Limited upper abdomen: Mild hepatic megaly and fatty infiltration of the liver, without change. No acute findings. Musculoskeletal: Mild degenerative endplate spurring along the thoracic spine. No osteoblastic or osteolytic lesions. IMPRESSION: 1. Lung opacities as described, most evident in the right upper and lower lobes, likely all atelectasis. There may be a component of scarring. No convincing pneumonia. No pulmonary edema. 2. Minimal right pleural effusion.  No pneumothorax. 3. Mild prominence of the main pulmonary artery which is stable from the prior exam. 4. Mildly enlarged precarinal lymph node measuring 11 mm in short axis, increased in size when compared the prior study. Electronically Signed   By: Lajean Manes M.D.   On: 02/21/2016 13:00    Assessment/Plan: S/P Procedure(s) (LRB): DILATATION & CURETTAGE/HYSTEROSCOPY/Myomectomy WITH MYOSURE (N/A) MYOMECTOMY WITH MYOSURE (N/A)   1 doing well 2 home when ok with primary svc   LOS: 5 days    GOLD,WAYNE E 02/22/2016

## 2016-02-22 NOTE — Progress Notes (Signed)
TEAM 1 - Stepdown/ICU TEAM Progress Note  Alvania Bille [id number] DOB: 1971-03-31 DOA: 02/17/2016 PCP: Horatio Pel, MD  Admit HPI / Brief Narrative: 45yo BF PMHx obese female with hx bipolar disorder, HTN, DM, OHS, dCHF, and uterine fibroids who  Presented to [facility] 3/2 for elective myomectomy. She was a very difficult intubation per anesthesia. She was extubated post op with subsequent respiratory distress (hypoxia with sats 70's). She was tried on bipap with some improvement. CXR revealed a large R ptx with complete collapse. CVTS placed R CT at [place] tx to [facility] ICU for further treatment.   HPI/Subjective: 3/7 A/O 4, sitting in chair comfortably  Assessment/Plan: Acute hypoxic respiratory failure - Complete R PTX -Chest tube removed 3/5   - f/u CXR today w/o ptx -Hypoxia resolved -PT/OT consult pending  Persisting R basilar and perihilar infiltrates persisting ?infiltrates on serial CXR despite diuresis, but old CXRs suggest this is a chronic finding  -unclear to me why this relatively young pt would have these chronic lung findings -O2 requirement resolved -CT chest; Mildly enlarged precarinal lymph node measuring 11 mm in short axis, increased in size -Will have patient follow-up with [facility], to monitor LN; would most likely require CT scan in 3 months  UTI -Changed ceftriaxone --> Ceftin to complete five-day course antibiotics    Reported Hx of Chronic grade 2 Diastolic CHF - No evidence presently  -scheduled diuresis continues - net negative ~3.2L thus far  - weight decreasing  - TTE 02/19/16 noted EF Q000111Q, normal diastolic fxn, and no WMA  OHS  Followed by [facility] Chessman as outpt - did not actually have apnea on sleep study therefore CPAP not indicated   DM type 2  -CBG reasonably controlled - no change in tx plan today  -A1c pending -Lipid panel pending  HTN  BP controlled   Hx bipolar disorder  Cont usual  home meds   Hx VP shunt   Uterine fibroids- Menorrhagia - s/p hysterocopic myomectomy  Post op care per OB/GYN (see their note of 02/17/16) - no signif increase in blood loss s/p initiation of lovenox   Obesity - Body mass index is 37.82 kg/(m^2).    Code Status: FULL Family Communication: no family present at time of exam Disposition Plan: 3/8    Consultants: TCTS PCCM   Procedure/Significant Events: 3/2 - elective myomectomy at [facility] 3/2 - emergent CT placement per TCTS - transfer to [facility] ICU 3/4 echocardiogram; EF Q000111Q, normal diastolic fxn, and no WMA 3/6 CT chest W contrast;Lung opacities as described, most evident in the right upper and lower lobes, likely all atelectasis. There may be a component of scarring.  - Mild prominence of the main pulmonary artery stable from prior exam. -Mildly enlarged precarinal lymph node measuring 11 mm in short axis, increased in size when compared the prior study.   Culture 3/4 urine negative final  Antibiotics: Ceftriaxone 3/5>> 3/7 Ceftin 3/7>>  DVT prophylaxis: Lovenox   Devices    LINES / TUBES:      Continuous Infusions:   Objective: VITAL SIGNS: Temp: 98.7 F (37.1 C) (03/07 1500) Temp Source: Oral (03/07 1500) BP: 123/67 mmHg (03/07 1500) Pulse Rate: 87 (03/07 1500) SPO2; FIO2:   Intake/Output Summary (Last 24 hours) at 02/22/16 1639 Last data filed at 02/22/16 1500  Gross per 24 hour  Intake    720 ml  Output      0 ml  Net    720 ml  Exam: General: No acute respiratory distress On room air sitting in chair comfortably Lungs: clear to auscultation bilateral  Cardiovascular: Regular rate and rhythm - no murmur  Abdomen: Nontender, nondistended, soft, bowel sounds positive Extremities: No significant cyanosis, clubbing, or edema bilateral lower extremities    Data Reviewed: Basic Metabolic Panel:  Recent Labs Lab 02/19/16 0441 02/20/16 0334 02/21/16 0218  NA 137 140  141  K 4.1 4.0 4.2  CL 95* 97* 92*  CO2 34* 35* 38*  GLUCOSE 140* 139* 122*  BUN 13 14 11   CREATININE 0.60 0.59 0.60  CALCIUM 9.1 9.0 9.1   Liver Function Tests:  Recent Labs Lab 02/20/16 0334  AST 12*  ALT 12*  ALKPHOS 55  BILITOT 0.9  PROT 6.3*  ALBUMIN 2.8*   No results for input(s): LIPASE, AMYLASE in the last 168 hours. No results for input(s): AMMONIA in the last 168 hours. CBC:  Recent Labs Lab 02/19/16 0441 02/20/16 0334 02/21/16 0218 02/22/16 0252  WBC 14.1* 12.7* 11.0* 10.1  HGB 10.7* 9.9* 10.5* 10.4*  HCT 35.4* 35.2* 37.5 37.5  MCV 87.8 88.9 88.7 89.5  PLT 379 372 415* 452*   Cardiac Enzymes: No results for input(s): CKTOTAL, CKMB, CKMBINDEX, TROPONINI in the last 168 hours. BNP (last 3 results)  Recent Labs  02/17/16 2030  BNP 14.3    ProBNP (last 3 results) No results for input(s): PROBNP in the last 8760 hours.  CBG:  Recent Labs Lab 02/21/16 1258 02/21/16 1649 02/21/16 2124 02/22/16 0628 02/22/16 1211  GLUCAP 110* 172* 115* 133* 129*    Recent Results (from the past 240 hour(s))  MRSA PCR Screening     Status: None   Collection Time: 02/17/16  5:37 PM  Result Value Ref Range Status   MRSA by PCR NEGATIVE NEGATIVE Final    Comment:        The GeneXpert MRSA Assay (FDA approved for NASAL specimens only), is one component of a comprehensive MRSA colonization surveillance program. It is not intended to diagnose MRSA infection nor to guide or monitor treatment for MRSA infections.   Culture, Urine     Status: None   Collection Time: 02/19/16  4:57 PM  Result Value Ref Range Status   Specimen Description URINE, CATHETERIZED  Final   Special Requests NONE  Final   Culture NO GROWTH 2 DAYS  Final   Report Status 02/21/2016 FINAL  Final     Studies:  Recent x-ray studies have been reviewed in detail by the Attending Physician  Scheduled Meds:  Scheduled Meds: . amLODipine  5 mg Oral Daily  . antiseptic oral rinse  7  mL Mouth Rinse BID  . cefTRIAXone (ROCEPHIN)  IV  1 g Intravenous Q24H  . divalproex  500 mg Oral Daily  . enoxaparin (LOVENOX) injection  40 mg Subcutaneous Q24H  . furosemide  20 mg Oral BID  . insulin aspart  0-5 Units Subcutaneous QHS  . insulin aspart  0-9 Units Subcutaneous TID WC  . irbesartan  150 mg Oral Daily  . lamoTRIgine  100 mg Oral Daily  . potassium chloride  20 mEq Oral Daily    Time spent on care of this patient: 40 mins   Engelbert Sevin, Geraldo Docker , MD  [facility] Office  [phone] Pager [phone]  On-Call/Text Page:      [email or web address]      password [email or web address]  If 7PM-7AM, please contact night-coverage [email or web address] Password [email or web address] 02/22/2016, 4:39 PM   LOS: 5 days  Care during the described time interval was provided by me .  I have reviewed this patient's available data, including medical history, events of note, physical examination, and all test results as part of my evaluation. I have personally reviewed and interpreted all radiology studies.   Dia Crawford, MD [phone] Pager

## 2016-02-23 DIAGNOSIS — J9601 Acute respiratory failure with hypoxia: Secondary | ICD-10-CM

## 2016-02-23 DIAGNOSIS — I10 Essential (primary) hypertension: Secondary | ICD-10-CM

## 2016-02-23 DIAGNOSIS — I5032 Chronic diastolic (congestive) heart failure: Secondary | ICD-10-CM

## 2016-02-23 LAB — GLUCOSE, CAPILLARY: Glucose-Capillary: 165 mg/dL — ABNORMAL HIGH (ref 65–99)

## 2016-02-23 MED ORDER — OXYCODONE HCL 5 MG PO TABS: 5.0000 mg | ORAL_TABLET | ORAL | Status: DC | PRN

## 2016-02-23 MED ORDER — IRBESARTAN 150 MG PO TABS: 150.0000 mg | ORAL_TABLET | Freq: Every day | ORAL | Status: DC

## 2016-02-23 MED ORDER — CEFUROXIME AXETIL 250 MG PO TABS: 250.0000 mg | ORAL_TABLET | Freq: Two times a day (BID) | ORAL | Status: DC

## 2016-02-23 NOTE — Progress Notes (Signed)
States her husband will be here to take her home around 11:30 AM, states excited to go home. 2W DON will review d/c instructions including reviewing AVS print out to prepare patient for d/c home.

## 2016-02-23 NOTE — Discharge Summary (Signed)
Physician Discharge Summary  Lorine Wollan MRN: [id number] DOB/AGE: [birth year] 45 y.o.  PCP: Horatio Pel, MD   Admit date: 02/17/2016 Discharge date: 02/23/2016  Discharge Diagnoses:     Active Problems:   Acute respiratory failure (HCC)   Hypoxemic respiratory failure, chronic (HCC)   Hypoxemia   Pneumothorax   Urinary tract infection, site not specified   Chronic diastolic CHF (congestive heart failure) (HCC)   Type 2 diabetes mellitus with complication (HCC)   Essential hypertension   Affective psychosis, bipolar (HCC)   Uterine fibroids affecting pregnancy    Follow-up recommendations Follow-up with PCP in 3-5 days , including all  additional recommended appointments as below Follow-up CBC, CMP in 3-5 days  Follow up with Len Childs, MD On 03/01/2016.  Repeat CT scan of the chest in 3 months, to be arranged for by PCP   Discharge Condition: Stable   Discharge Instructions    Current Discharge Medication List    START taking these medications   Details  cefUROXime (CEFTIN) 250 MG tablet Take 1 tablet (250 mg total) by mouth 2 (two) times daily with a meal. Qty: 10 tablet, Refills: 0    oxyCODONE (OXY IR/ROXICODONE) 5 MG immediate release tablet Take 1 tablet (5 mg total) by mouth every 4 (four) hours as needed for severe pain. Qty: 30 tablet, Refills: 0      CONTINUE these medications which have CHANGED   Details  irbesartan (AVAPRO) 150 MG tablet Take 1 tablet (150 mg total) by mouth daily. Qty: 30 tablet, Refills: 0      CONTINUE these medications which have NOT CHANGED   Details  amLODipine (NORVASC) 5 MG tablet Take 5 mg by mouth daily.    Canagliflozin-Metformin HCl (INVOKAMET) 50-1000 MG TABS Take 1 tablet by mouth 2 (two) times daily.    divalproex (DEPAKOTE ER) 500 MG 24 hr tablet Take 500 mg by mouth daily.     ferrous sulfate 325 (65 FE) MG tablet Take 1 tablet (325 mg total) by mouth 2 (two) times daily with a meal. Qty: 60  tablet, Refills: 6    furosemide (LASIX) 40 MG tablet Take 1 tablet (40 mg total) by mouth daily as needed (for weight gain of > 3 lbs in 24 hr w/ lower ext swelling). Qty: 30 tablet, Refills: 6    lamoTRIgine (LAMICTAL) 200 MG tablet Take 100 mg by mouth daily. Takes 1/2 tablet daily    Methylsulfonylmethane (MSM PO) Take 2 tablets by mouth daily.     potassium chloride (K-DUR) 10 MEQ tablet Take 2 tablets (20 mEq total) by mouth daily as needed (take only on days that you take lasix). Qty: 30 tablet, Refills: 6    pravastatin (PRAVACHOL) 80 MG tablet Take 1 tablet (80 mg total) by mouth daily. For high cholesterol control    traMADol (ULTRAM) 50 MG tablet Take 1 tablet (50 mg total) by mouth 2 (two) times daily as needed for severe pain. Qty: 20 tablet, Refills: 0       Allergies  Allergen Reactions  . Abilify [Aripiprazole]     hyperglycemia      Disposition: 01-Home or Self Care   Consults:  TCTS PCCM     Significant Diagnostic Studies:  Dg Chest 2 View  02/21/2016  CLINICAL DATA:  Shortness of breath, previous pneumothorax with chest tube treatment EXAM: CHEST  2 VIEW COMPARISON:  Portable chest x-ray of February 20, 2016 FINDINGS: There has been interval removal of the right-sided chest  tube. There is no new residual pneumothorax and no pleural effusion. The interstitial markings are mildly in the right middle and lower lobes but appear stable. The heart is normal in size. The pulmonary vascularity is not engorged. The right internal jugular venous catheter tip projects over the midportion of the SVC. There is mild degenerative disc disease of the thoracic spine. IMPRESSION: No residual pneumothorax or pleural effusion on the right since chest tube removal. Stable mildly increased interstitial density in the right mid and lower lung. Electronically Signed   By: David  Martinique M.D.   On: 02/21/2016 07:19   Ct Chest W Contrast  02/21/2016  CLINICAL DATA:  Pulmonary  infiltrates. Bibasilar pulmonary infiltrates dating back many years with no explanation in med hx - had a PTX following positive pressure during elective GYN surgery. Hx HTN, diabetes, and pneumonia. EXAM: CT CHEST WITH CONTRAST TECHNIQUE: Multidetector CT imaging of the chest was performed during intravenous contrast administration. CONTRAST:  14mL OMNIPAQUE IOHEXOL 300 MG/ML  SOLN COMPARISON:  Chest CT, 03/31/2013 FINDINGS: Neck base and axilla: No mass or adenopathy. Visualized thyroid is unremarkable. Mediastinum and hila: Heart mildly enlarged. Stable dilation of the main pulmonary artery to 3.1 cm. Mildly enlarged precarinal lymph node measures 11 mm in short axis increased from the prior CT. No other mediastinal adenopathy. No mediastinal masses. No hilar masses or discrete enlarged lymph nodes. Lungs and pleura: On the right, there is opacity that extends from the right lateral, inferior upper lobe at the minor fissure superiorly adjacent to the oblique fissure to near the left apex. This is likely atelectasis along the tract of the recently removed right chest tube. There is a minimal right pleural effusion. Patchy and linear/reticular opacity is noted in the right lower lobe posteriorly and in the posterior lateral right upper lobe, likely atelectasis. There is an area of linear type opacity in the left lower lobe that is also likely atelectasis. Some of this may be scarring. There is no convincing pneumonia. No evidence of pulmonary edema. Airways are patent. No pneumothorax. Limited upper abdomen: Mild hepatic megaly and fatty infiltration of the liver, without change. No acute findings. Musculoskeletal: Mild degenerative endplate spurring along the thoracic spine. No osteoblastic or osteolytic lesions. IMPRESSION: 1. Lung opacities as described, most evident in the right upper and lower lobes, likely all atelectasis. There may be a component of scarring. No convincing pneumonia. No pulmonary edema. 2.  Minimal right pleural effusion.  No pneumothorax. 3. Mild prominence of the main pulmonary artery which is stable from the prior exam. 4. Mildly enlarged precarinal lymph node measuring 11 mm in short axis, increased in size when compared the prior study. Electronically Signed   By: Lajean Manes M.D.   On: 02/21/2016 13:00   Dg Chest Port 1 View  02/20/2016  CLINICAL DATA:  Pneumothorax, hypertension, diabetes mellitus, hyperlipidemia EXAM: PORTABLE CHEST 1 VIEW COMPARISON:  Portable exam 0746 hours compared to 02/19/2016 FINDINGS: Shut tubing traverses RIGHT chest.  RIGHT thoracostomy tube present. Mild enlargement of cardiac silhouette. Mediastinal contours and pulmonary vascularity normal. RIGHT perihilar and basilar infiltrate again seen. Question minimal infiltrate or atelectasis at LEFT base. Remaining lungs clear. No pneumothorax. IMPRESSION: Persistent RIGHT side infiltrates in perihilar and basilar regions. Electronically Signed   By: Lavonia Dana M.D.   On: 02/20/2016 08:56   Dg Chest Port 1 View  02/19/2016  CLINICAL DATA:  Right-sided chest tube. EXAM: PORTABLE CHEST 1 VIEW COMPARISON:  February 18, 2016 FINDINGS: A right-sided  chest tube remains in place. No pneumothorax. Right-sided pulmonary opacity persists but is improved. Mild opacity in left mid lung is similar to slightly more prominent the interval. No other changes. IMPRESSION: No pneumothorax with right-sided chest tube. Bibasilar opacities, right greater the left, improved on the right and stable to mildly worsened on the left in the interval. Electronically Signed   By: Dorise Bullion III M.D   On: 02/19/2016 09:39   Dg Chest Port 1 View  02/18/2016  CLINICAL DATA:  Chest tube. EXAM: PORTABLE CHEST 1 VIEW COMPARISON:  02/17/2016. FINDINGS: Right chest tube in stable position. VP shunt stable position. Stable cardiomegaly. Persistent right lung infiltrate consistent with pneumonia or re-expansion pulmonary edema. Mild left mid lung  subsegmental atelectasis and/or infiltrate. No pleural effusion. No pneumothorax. IMPRESSION: 1. Right chest tube in stable position. No pneumothorax. VP shunt in stable position. 2. Persistent diffuse infiltrate right lung consistent with pneumonia and/or re-expansion pulmonary edema. Mild left mid lung field subsegmental atelectasis and or infiltrate . Electronically Signed   By: Marcello Moores  Register   On: 02/18/2016 07:29   Dg Chest Port 1 View  02/17/2016  CLINICAL DATA:  Pneumothorax EXAM: PORTABLE CHEST 1 VIEW COMPARISON:  Earlier today FINDINGS: Right chest tube as been placed. Right pneumothorax has resolved. Patchy airspace opacities have developed throughout the right lung. There are hazy airspace opacities in the left mid and lower lung zone. Cardiomegaly. VP shunt fragment remains in place. IMPRESSION: Right chest tube placed with resolution of the right pneumothorax Bilateral airspace disease. Electronically Signed   By: Marybelle Killings M.D.   On: 02/17/2016 15:19   Dg Chest Port 1 View  02/17/2016  CLINICAL DATA:  Hypoxemia, postop, history of VP shunt EXAM: PORTABLE CHEST 1 VIEW COMPARISON:  05/05/2013 FINDINGS: Borderline cardiomegaly. Extensive right pneumothorax with complete collapse of the right lung. Left lung is clear.  Right VP shunt catheter is stable in position. IMPRESSION: Massive right pneumothorax with complete collapse of the right lung. Critical Value/emergent results were called by telephone at the time of interpretation on 02/17/2016 at 2:05 pm to Dr. Oleta Mouse , who verbally acknowledged these results. Electronically Signed   By: Lahoma Crocker M.D.   On: 02/17/2016 14:06       Filed Weights   02/21/16 0528 02/22/16 0527 02/23/16 0453  Weight: 79.3 kg (174 lb 13.2 oz) 75.6 kg (166 lb 10.7 oz) 75.6 kg (166 lb 10.7 oz)     Microbiology: Recent Results (from the past 240 hour(s))  MRSA PCR Screening     Status: None   Collection Time: 02/17/16  5:37 PM  Result Value Ref  Range Status   MRSA by PCR NEGATIVE NEGATIVE Final    Comment:        The GeneXpert MRSA Assay (FDA approved for NASAL specimens only), is one component of a comprehensive MRSA colonization surveillance program. It is not intended to diagnose MRSA infection nor to guide or monitor treatment for MRSA infections.   Culture, Urine     Status: None   Collection Time: 02/19/16  4:57 PM  Result Value Ref Range Status   Specimen Description URINE, CATHETERIZED  Final   Special Requests NONE  Final   Culture NO GROWTH 2 DAYS  Final   Report Status 02/21/2016 FINAL  Final       Blood Culture    Component Value Date/Time   SDES URINE, CATHETERIZED 02/19/2016 1657   SPECREQUEST NONE 02/19/2016 1657   CULT NO GROWTH  2 DAYS 02/19/2016 1657   REPTSTATUS 02/21/2016 FINAL 02/19/2016 1657      Labs: Results for orders placed or performed during the hospital encounter of 02/17/16 (from the past 48 hour(s))  Influenza panel by PCR (type A & B, H1N1)     Status: None   Collection Time: 02/21/16 10:54 AM  Result Value Ref Range   Influenza A By PCR NEGATIVE NEGATIVE   Influenza B By PCR NEGATIVE NEGATIVE   H1N1 flu by pcr NOT DETECTED NOT DETECTED    Comment:        The Xpert Flu assay (FDA approved for nasal aspirates or washes and nasopharyngeal swab specimens), is intended as an aid in the diagnosis of influenza and should not be used as a sole basis for treatment.   Glucose, capillary     Status: Abnormal   Collection Time: 02/21/16 12:58 PM  Result Value Ref Range   Glucose-Capillary 110 (H) 65 - 99 mg/dL  Glucose, capillary     Status: Abnormal   Collection Time: 02/21/16  4:49 PM  Result Value Ref Range   Glucose-Capillary 172 (H) 65 - 99 mg/dL   Comment 1 Notify RN    Comment 2 Document in Chart   Glucose, capillary     Status: Abnormal   Collection Time: 02/21/16  9:24 PM  Result Value Ref Range   Glucose-Capillary 115 (H) 65 - 99 mg/dL   Comment 1 Notify RN    CBC     Status: Abnormal   Collection Time: 02/22/16  2:52 AM  Result Value Ref Range   WBC 10.1 4.0 - 10.5 K/uL   RBC 4.19 3.87 - 5.11 MIL/uL   Hemoglobin 10.4 (L) 12.0 - 15.0 g/dL   HCT 37.5 36.0 - 46.0 %   MCV 89.5 78.0 - 100.0 fL   MCH 24.8 (L) 26.0 - 34.0 pg   MCHC 27.7 (L) 30.0 - 36.0 g/dL   RDW 15.0 11.5 - 15.5 %   Platelets 452 (H) 150 - 400 K/uL  Glucose, capillary     Status: Abnormal   Collection Time: 02/22/16  6:28 AM  Result Value Ref Range   Glucose-Capillary 133 (H) 65 - 99 mg/dL  Glucose, capillary     Status: Abnormal   Collection Time: 02/22/16 12:11 PM  Result Value Ref Range   Glucose-Capillary 129 (H) 65 - 99 mg/dL  Glucose, capillary     Status: Abnormal   Collection Time: 02/22/16  5:22 PM  Result Value Ref Range   Glucose-Capillary 164 (H) 65 - 99 mg/dL  Glucose, capillary     Status: Abnormal   Collection Time: 02/22/16  9:45 PM  Result Value Ref Range   Glucose-Capillary 138 (H) 65 - 99 mg/dL   Comment 1 Notify RN   Lipid panel     Status: Abnormal   Collection Time: 02/22/16  9:50 PM  Result Value Ref Range   Cholesterol 188 0 - 200 mg/dL   Triglycerides 89 <150 mg/dL   HDL 46 >40 mg/dL   Total CHOL/HDL Ratio 4.1 RATIO   VLDL 18 0 - 40 mg/dL   LDL Cholesterol 124 (H) 0 - 99 mg/dL    Comment:        Total Cholesterol/HDL:CHD Risk Coronary Heart Disease Risk Table                     Men   Women  1/2 Average Risk   3.4   3.3  Average  Risk       5.0   4.4  2 X Average Risk   9.6   7.1  3 X Average Risk  23.4   11.0        Use the calculated Patient Ratio above and the CHD Risk Table to determine the patient's CHD Risk.        ATP III CLASSIFICATION (LDL):  <100     mg/dL   Optimal  100-129  mg/dL   Near or Above                    Optimal  130-159  mg/dL   Borderline  160-189  mg/dL   High  >190     mg/dL   Very High   Glucose, capillary     Status: Abnormal   Collection Time: 02/23/16  6:43 AM  Result Value Ref Range    Glucose-Capillary 165 (H) 65 - 99 mg/dL   Comment 1 Notify RN      Lipid Panel     Component Value Date/Time   CHOL 188 02/22/2016 2150   TRIG 89 02/22/2016 2150   HDL 46 02/22/2016 2150   CHOLHDL 4.1 02/22/2016 2150   VLDL 18 02/22/2016 2150   LDLCALC 124* 02/22/2016 2150     Lab Results  Component Value Date   HGBA1C 6.9* 03/31/2013     Lab Results  Component Value Date   LDLCALC 124* 02/22/2016   CREATININE 0.60 02/21/2016     HPI :45yo BF PMHx obese female with hx bipolar disorder, HTN, DM, OHS, dCHF, and uterine fibroids who  Presented to [facility] 3/2 for elective myomectomy. She was a very difficult intubation per anesthesia. She was extubated post op with subsequent respiratory distress (hypoxia with sats 70's). She was tried on bipap with some improvement. CXR revealed a large R ptx with complete collapse. CVTS placed R CT at [place] tx to [facility] ICU for further treatment  HOSPITAL COURSE:   Acute hypoxic respiratory failure - Complete R PTX -Chest tube removed 3/5  CT chest 02/21/16 stable -Hypoxia resolved -PT/OT consult pending  Persisting R basilar and perihilar infiltrates persisting ?infiltrates on serial CXR despite diuresis, but old CXRs suggest this is a chronic finding . --O2 requirement resolved -CT chest; Mildly enlarged precarinal lymph node measuring 11 mm in short axis, increased in size -Will have patient follow-up with PCCM, to monitor LN; would most likely require CT scan in 3 months  UTI -Changed ceftriaxone --> Ceftin to complete five-day course antibiotics   Reported Hx of Chronic grade 2 Diastolic CHF - No evidence presently  -scheduled diuresis continues - net negative ~3.2L thus far  - weight decreasing  - TTE 02/19/16 noted EF [id number], normal diastolic fxn, and no WMA  OHS  Followed by [facility]ssman as outpt - did not actually have apnea on sleep study therefore CPAP not indicated   DM type 2  -CBG  reasonably controlled - no change in tx plan today  -A1c pending -Lipid panel LDL 124, triglycerides 89  HTN  BP controlled , continue Norvasc  Hx bipolar disorder  Cont usual home meds   Hx VP shunt   Uterine fibroids- Menorrhagia - s/p hysterocopic myomectomy  Post op care per OB/GYN (see their note of 02/17/16) - no signif increase in blood loss s/p initiation of lovenox   Obesity - Body mass index is 37.82 kg/(m^2).    Discharge Exam:   Blood pressure 135/79, pulse  87, temperature 98 F (36.7 C), temperature source Oral, resp. rate 18, height 4\' 9"  (1.448 m), weight 75.6 kg (166 lb 10.7 oz), last menstrual period 02/09/2016, SpO2 90 %.  General: No acute respiratory distress On room air sitting in chair comfortably Lungs: clear to auscultation bilateral  Cardiovascular: Regular rate and rhythm - no murmur  Abdomen: Nontender, nondistended, soft, bowel sounds positive Extremities: No significant cyanosis, clubbing, or edema bilateral lower extremities        Follow-up Information    Follow up with Ivin Poot III, MD On 03/01/2016.   Specialty:  Cardiothoracic Surgery   Why:  PA/LAt CXR to be taken (at [facility] which is in the same building as Dr. Lucianne Lei Trigt's office) on 03/01/2016 at 11:45 am;Appointment time is at  12:30 pm   Contact information:   [place] [address] [phone]       Follow up with Horatio Pel, MD. Schedule an appointment as soon as possible for a visit in 3 days.   Specialty:  Internal Medicine   Contact information:   [place] [address] [phone]       Signed: Reyne Dumas 02/23/2016, 8:40 AM        Time spent >45 mins

## 2016-02-23 NOTE — Progress Notes (Signed)
D/C paperwork reviewed w/ pt; follow up appts reviewed and MD office numbers provided on AVS;  all medications reviewed. Pt verbalized understanding. Copy provided. Pt's saline lock d/c'd. Catheter intact. No bleeding at site. Pt assisted w/ dressing. Belongings packed with patient assist. Pt waiting on husband who is parking vehicle. Will be d/c home via wheelchair to Personal vehicle soon.   No further questions from pt.--JM

## 2016-02-23 NOTE — Care Management Note (Signed)
Case Management Note  Patient Details  Name: Nakeira Scullin MRN: [id number] Date of Birth: [birth year]  Subjective/Objective:  45 y.o. F to be discharged home today. PT/OT have no home health recommendations.                   Action/Plan:No CM needs. Will be available should CM needs arise.    Expected Discharge Date:  02/24/16               Expected Discharge Plan:  Home/Self Care  In-House Referral:     Discharge planning Services  CM Consult  Post Acute Care Choice:    Choice offered to:     DME Arranged:    DME Agency:     HH Arranged:    HH Agency:     Status of Service:  Completed, signed off  Medicare Important Message Given:    Date Medicare IM Given:    Medicare IM give by:    Date Additional Medicare IM Given:    Additional Medicare Important Message give by:     If discussed at [place] of Stay Meetings, dates discussed:    Additional Comments:  Delrae Sawyers, RN 02/23/2016, 10:52 AM

## 2016-02-23 NOTE — Evaluation (Signed)
Physical Therapy Evaluation Patient Details Name: [name] MRN: [id number] DOB: 21-Jan-1971 Today's Date: 02/23/2016   History of Present Illness  Patient is a 45 y/o female with hx of HTN, HLD, DM, bipolar disorder, OSA, dCHF, and uterine fibroids presents to [facility] 3/2 for elective myomectomy. Respiratory distress after extiubation. CXR revealed a large R ptx with complete collapse s/p chest tube placement and tx to [facility].   Clinical Impression  Patient tolerated ambulation with mild dyspnea on exertion, which pt reports as baseline without LOB or difficulty. 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 No balance deficits noted during mobility today. Pt seems to be functioning close to baseline. All education completed. Encouraged ambulation while in hospital. Pt does not require further skilled therapy services. Discharged from therapy.     Follow Up Recommendations No PT follow up    Equipment Recommendations  None recommended by PT    Recommendations for Other Services       Precautions / Restrictions Precautions Precautions: None Restrictions Weight Bearing Restrictions: No      Mobility  Bed Mobility Overal bed mobility: Independent             General bed mobility comments: No assist needed. HOB elevated.  Transfers Overall transfer level: Modified independent Equipment used: None Transfers: Sit to/from Stand Sit to Stand: Independent         General transfer comment: No dizziness or assist needed.   Ambulation/Gait Ambulation/Gait assistance: Modified independent (Device/Increase time) Ambulation Distance (Feet): 300 Feet Assistive device: None Gait Pattern/deviations: Step-through pattern;Decreased stride length   Gait velocity interpretation: <1.8 ft/sec, indicative of risk for recurrent falls General Gait Details: Slow, guarded gait. Mild DOE. No  LOB.  Stairs            Wheelchair Mobility    Modified Rankin (Stroke Patients Only)       Balance Overall balance assessment: No apparent balance deficits (not formally assessed)                                           Pertinent Vitals/Pain Pain Assessment: No/denies pain    Home Living Family/patient expects to be discharged to:: Private residence Living Arrangements: Spouse/significant other;Children Available Help at Discharge: Family Type of Home: House Home Access: Level entry     Home Layout: Two level;Able to live on main level with bedroom/bathroom Home Equipment: None      Prior Function Level of Independence: Independent         Comments: 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 Has 2 kids 59 and 39 y/o.     Hand Dominance        Extremity/Trunk Assessment   Upper Extremity Assessment: Defer to OT evaluation           Lower Extremity Assessment: Overall WFL for tasks assessed      Cervical / Trunk Assessment: Normal  Communication   Communication: No difficulties  Cognition Arousal/Alertness: Awake/alert Behavior During Therapy: WFL for tasks assessed/performed Overall Cognitive Status: Within Functional Limits for tasks assessed                      General Comments General comments (skin integrity, edema, etc.): Discussed proper body mechanics relating to custodial work and needing clearance from MD prior to returning to  work.     Primary school teacher    PT Assessment Patent does not need any further PT services  PT Diagnosis Difficulty walking   PT Problem List    PT Treatment Interventions     PT Goals (Current goals can be found in the Care Plan section) Acute Rehab PT Goals Patient Stated Goal: return to work PT Goal Formulation: All assessment and education complete, DC therapy    Frequency     Barriers to discharge        Co-evaluation                End of Session Equipment Utilized During Treatment: Gait belt Activity Tolerance: Patient tolerated treatment well Patient left: in bed;with call bell/phone within reach Nurse Communication: Mobility status         Time: [id number] PT Time Calculation (min) (ACUTE ONLY): 12 min   Charges:   PT Evaluation $PT Eval Low Complexity: 1 Procedure     PT G Codes:        Harish Bram A Addisson Frate 02/23/2016, 10:32 AM  Wray Kearns, PT, DPT [phone]

## 2016-02-24 LAB — HEMOGLOBIN A1C
HEMOGLOBIN A1C: 6.1 % — AB (ref 4.8–5.6)
Mean Plasma Glucose: 128 mg/dL

## 2016-02-29 ENCOUNTER — Other Ambulatory Visit: Payer: Self-pay | Admitting: Cardiothoracic Surgery

## 2016-02-29 DIAGNOSIS — J939 Pneumothorax, unspecified: Secondary | ICD-10-CM

## 2016-03-01 ENCOUNTER — Telehealth: Payer: Self-pay | Admitting: Cardiology

## 2016-03-01 ENCOUNTER — Ambulatory Visit: Payer: Federal, State, Local not specified - PPO | Admitting: Cardiothoracic Surgery

## 2016-03-01 NOTE — Telephone Encounter (Signed)
Received records from [facility] for appointment on 03/14/16 with Dr Stanford Breed.  Records given to [facility] (medical records) for Dr Jacalyn Lefevre schedule on 03/14/16. lp

## 2016-03-07 NOTE — Progress Notes (Signed)
HPI: 45 year old female for evaluation of congestive heart failure. Echocardiogram March 2017 showed vigorous LV function. Patient has a history of chronic diastolic congestive heart failure. She was recently admitted for myomectomy. She apparently was a difficult intubation and suffered a right pneumothorax. She had a chest tube placed. Patient has dyspnea with more extreme activities but not routine activities. No orthopnea, PND, pedal edema, chest pain, palpitations or syncope.  Current Outpatient Prescriptions  Medication Sig Dispense Refill  . amLODipine (NORVASC) 5 MG tablet Take 5 mg by mouth daily.    . cefUROXime (CEFTIN) 250 MG tablet Take 1 tablet (250 mg total) by mouth 2 (two) times daily with a meal. 10 tablet 0  . divalproex (DEPAKOTE ER) 500 MG 24 hr tablet Take 500 mg by mouth daily.     . ferrous sulfate 325 (65 FE) MG tablet Take 325 mg by mouth daily with breakfast.    . furosemide (LASIX) 40 MG tablet Take 1 tablet (40 mg total) by mouth daily as needed (for weight gain of > 3 lbs in 24 hr w/ lower ext swelling). 30 tablet 6  . irbesartan (AVAPRO) 150 MG tablet Take 1 tablet (150 mg total) by mouth daily. 30 tablet 0  . lamoTRIgine (LAMICTAL) 200 MG tablet Take 100 mg by mouth daily. Takes 1/2 tablet daily    . metFORMIN (GLUCOPHAGE) 1000 MG tablet Take 1,000 mg by mouth 2 (two) times daily with a meal.    . Methylsulfonylmethane (MSM PO) Take 2 tablets by mouth daily.     . pravastatin (PRAVACHOL) 80 MG tablet Take 40 mg by mouth daily.    . traMADol (ULTRAM) 50 MG tablet Take 1 tablet (50 mg total) by mouth 2 (two) times daily as needed for severe pain. 20 tablet 0   No current facility-administered medications for this visit.    Allergies  Allergen Reactions  . Abilify [Aripiprazole]     hyperglycemia     Past Medical History  Diagnosis Date  . Hypertension   . Diabetes mellitus without complication ([place])   . Hyperlipidemia 03/31/2013  . Bipolar disorder  ([place]) 03/31/2013  . Vitamin D deficiency 03/31/2013  . Anemia microcytic 03/31/2013  . Rhabdomyolysis 03/31/2013    [place] secondary to abilify  . OSA (obstructive sleep apnea)   . Plantar fasciitis of left foot   . Pneumonia   . Pneumothorax   . CHF (congestive heart failure) [facility])     Past Surgical History  Procedure Laterality Date  . Ventriculoperitoneal shunt    . Cesarean section      x2  . Cyst excision    . Dilatation & curettage/hysteroscopy with myosure N/A 02/17/2016    Procedure: DILATATION & CURETTAGE/HYSTEROSCOPY/Myomectomy WITH MYOSURE;  Surgeon: Aloha Gell, MD;  Location: [place] [facility];  Service: Gynecology;  Laterality: N/A;  . Myomectomy N/A 02/17/2016    Procedure: Amado Coe WITH Jacklynn Barnacle;  Surgeon: Aloha Gell, MD;  Location: [place] [facility];  Service: Gynecology;  Laterality: N/A;    Social History   Social History  . Marital Status: Married    Spouse Name: N/A  . Number of Children: 2  . Years of Education: N/A   Occupational History  . Postal service    Social History Main Topics  . Smoking status: Never Smoker   . Smokeless tobacco: Never Used  . Alcohol Use: No  . Drug Use: No  . Sexual Activity: Yes    Birth Control/ Protection: None   Other Topics Concern  .  Not on file   Social History Narrative    Family History  Problem Relation Age of Onset  . Diabetes Mother   . Hyperlipidemia Mother   . Hypertension Mother   . Lung cancer Father   . Hypertension Brother     ROS: no fevers or chills, productive cough, hemoptysis, dysphasia, odynophagia, melena, hematochezia, dysuria, hematuria, rash, seizure activity, orthopnea, PND, pedal edema, claudication. Remaining systems are negative.  Physical Exam:   Blood pressure 132/82, pulse 92, height 4\' 9"  (1.448 m), weight 172 lb (78.019 kg), last menstrual period 02/14/2016.  General:  Well developed/obese in NAD Skin warm/dry Patient not depressed No peripheral clubbing Back-normal HEENT-normal/normal  eyelids Neck supple/normal carotid upstroke bilaterally; no bruits; no JVD; no thyromegaly chest - CTA/ normal expansion CV - RRR/normal S1 and S2; no rubs or gallops;  PMI nondisplaced; 1/6 SEM LSB Abdomen -NT/ND, no HSM, no mass, + bowel sounds, no bruit 2+ femoral pulses, no bruits Ext-no edema, chords, 2+ DP Neuro-grossly nonfocal  ECG 02/17/2016-sinus rhythm, lateral infarct, nonspecific ST changes.

## 2016-03-08 ENCOUNTER — Ambulatory Visit: Payer: Federal, State, Local not specified - PPO | Admitting: Cardiothoracic Surgery

## 2016-03-09 ENCOUNTER — Encounter: Payer: Self-pay | Admitting: Cardiothoracic Surgery

## 2016-03-09 ENCOUNTER — Ambulatory Visit
Admission: RE | Admit: 2016-03-09 | Discharge: 2016-03-09 | Disposition: A | Discharge status: Home / Self Care | Payer: Federal, State, Local not specified - PPO | Source: Ambulatory Visit | Attending: Cardiothoracic Surgery | Admitting: Cardiothoracic Surgery

## 2016-03-09 ENCOUNTER — Ambulatory Visit (INDEPENDENT_AMBULATORY_CARE_PROVIDER_SITE_OTHER): Payer: Federal, State, Local not specified - PPO | Admitting: Cardiothoracic Surgery

## 2016-03-09 VITALS — BP 140/100 | HR 90 | Resp 20 | Ht <= 58 in | Wt 166.0 lb

## 2016-03-09 DIAGNOSIS — J939 Pneumothorax, unspecified: Secondary | ICD-10-CM

## 2016-03-09 NOTE — Progress Notes (Signed)
PCP is Horatio Pel, MD Referring Provider is Rush Farmer, MD  Chief Complaint  Patient presents with  . Spontaneous Pneumothorax    f/u with CXR    HPI:the patient returns for her scheduled followup after having a right chest tube emergency placed at [facility] after the patient had a pelvic procedure under general anesthesia which was a difficult intubation. The chest tube resolved the pneumothorax. [facility] for [facility] tube treatment. The chest tube was removed, the lung remained expanded, and she was discharged home. She denies any breathing difficulties since discharge. She does have some mild tenderness of the chest tube site. There is been no drainage. Today the chest tube skin sutures removed in the office.  A chest tube performed today shows both lungs to be fully expanded, no pleural effusion, no abnormalities noted.   Past Medical History  Diagnosis Date  . Hypertension   . Diabetes mellitus without complication ([place])   . Hyperlipidemia 03/31/2013  . Bipolar disorder ([place]) 03/31/2013  . Diabetes ([place]) 03/31/2013  . Vitamin D deficiency 03/31/2013  . Anemia microcytic 03/31/2013  . Rhabdomyolysis 03/31/2013    [place] secondary to abilify  . OSA (obstructive sleep apnea)   . Plantar fasciitis of left foot   . Pneumonia     Past Surgical History  Procedure Laterality Date  . Ventriculoperitoneal shunt    . Cesarean section    . Cyst excision    . Dilatation & curettage/hysteroscopy with myosure N/A 02/17/2016    Procedure: DILATATION & CURETTAGE/HYSTEROSCOPY/Myomectomy WITH MYOSURE;  Surgeon: Aloha Gell, MD;  Location: [facility];  Service: Gynecology;  Laterality: N/A;  . Myomectomy N/A 02/17/2016    Procedure: Amado Coe WITH Jacklynn Barnacle;  Surgeon: Aloha Gell, MD;  Location: [place];  Service: Gynecology;  Laterality: N/A;    No family history on file.  Social History Social History  Substance Use Topics  . Smoking status:  Never Smoker   . Smokeless tobacco: Never Used  . Alcohol Use: No    Current Outpatient Prescriptions  Medication Sig Dispense Refill  . amLODipine (NORVASC) 5 MG tablet Take 5 mg by mouth daily.    . Canagliflozin-Metformin HCl (INVOKAMET) 50-1000 MG TABS Take 1 tablet by mouth 2 (two) times daily.    . cefUROXime (CEFTIN) 250 MG tablet Take 1 tablet (250 mg total) by mouth 2 (two) times daily with a meal. 10 tablet 0  . divalproex (DEPAKOTE ER) 500 MG 24 hr tablet Take 500 mg by mouth daily.     . ferrous sulfate 325 (65 FE) MG tablet Take 1 tablet (325 mg total) by mouth 2 (two) times daily with a meal. (Patient taking differently: Take 325 mg by mouth daily. ) 60 tablet 6  . furosemide (LASIX) 40 MG tablet Take 1 tablet (40 mg total) by mouth daily as needed (for weight gain of > 3 lbs in 24 hr w/ lower ext swelling). 30 tablet 6  . irbesartan (AVAPRO) 150 MG tablet Take 1 tablet (150 mg total) by mouth daily. 30 tablet 0  . lamoTRIgine (LAMICTAL) 200 MG tablet Take 100 mg by mouth daily. Takes 1/2 tablet daily    . Methylsulfonylmethane (MSM PO) Take 2 tablets by mouth daily.     . potassium chloride (K-DUR) 10 MEQ tablet Take 2 tablets (20 mEq total) by mouth daily as needed (take only on days that you take lasix). 30 tablet 6  . pravastatin (PRAVACHOL) 80 MG tablet Take  1 tablet (80 mg total) by mouth daily. For high cholesterol control (Patient taking differently: Take 40 mg by mouth daily. For high cholesterol control)    . traMADol (ULTRAM) 50 MG tablet Take 1 tablet (50 mg total) by mouth 2 (two) times daily as needed for severe pain. 20 tablet 0   No current facility-administered medications for this visit.    Allergies  Allergen Reactions  . 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  BP 140/100 mmHg  Pulse 90  Resp 20  Ht 4\' 9"  (1.448 m)  Wt 166 lb (75.297 kg)  BMI 35.91 kg/m2  SpO2 98%  LMP 02/14/2016 Physical Exam Alert and comfortable Breath sounds clear and equal No subcutaneous air in the neck Heart rhythm regular Good peripheral pulses Abdomen soft  Diagnostic Tests: Chest x-ray personally reviewed and counseled with patient. Complete resolution of right pneumothorax  Impression: Resolved right pneumothorax with chest tube placement. Normal chest x-ray No evidence of long-standing lung injury or disability.  Plan: Patient may return to work, purges but a normal activities. She may lift up to 20 pounds now. She may lift more than 20 pounds starting 3 months after surgery. She  Will return here as needed.  Len Childs, MD [facility] [phone]

## 2016-03-14 ENCOUNTER — Encounter: Payer: Self-pay | Admitting: Cardiology

## 2016-03-14 ENCOUNTER — Ambulatory Visit (INDEPENDENT_AMBULATORY_CARE_PROVIDER_SITE_OTHER): Payer: Federal, State, Local not specified - PPO | Admitting: Cardiology

## 2016-03-14 ENCOUNTER — Ambulatory Visit: Payer: Federal, State, Local not specified - PPO | Admitting: Cardiothoracic Surgery

## 2016-03-14 VITALS — BP 132/82 | HR 92 | Ht <= 58 in | Wt 172.0 lb

## 2016-03-14 DIAGNOSIS — E785 Hyperlipidemia, unspecified: Secondary | ICD-10-CM

## 2016-03-14 DIAGNOSIS — I5032 Chronic diastolic (congestive) heart failure: Secondary | ICD-10-CM | POA: Diagnosis not present

## 2016-03-14 DIAGNOSIS — I10 Essential (primary) hypertension: Secondary | ICD-10-CM

## 2016-03-14 NOTE — Assessment & Plan Note (Signed)
Blood pressure controlled. Continue present medications. 

## 2016-03-14 NOTE — Assessment & Plan Note (Signed)
Patient is euvolemic on examination. Continue Lasix 40 mg daily as needed.

## 2016-03-14 NOTE — Assessment & Plan Note (Signed)
Continue statin. Lipids and liver monitored by primary care. 

## 2016-03-14 NOTE — Patient Instructions (Signed)
Your physician wants you to follow-up in: ONE YEAR WITH DR CRENSHAW You will receive a reminder letter in the mail two months in advance. If you don't receive a letter, please call our office to schedule the follow-up appointment.   If you need a refill on your cardiac medications before your next appointment, please call your pharmacy.  

## 2016-03-14 NOTE — Assessment & Plan Note (Signed)
Patient instructed on the importance of weight loss. We discussed diet and exercise.

## 2016-03-29 ENCOUNTER — Inpatient Hospital Stay: Payer: Self-pay | Admitting: Adult Health

## 2016-04-04 ENCOUNTER — Encounter: Payer: Self-pay | Admitting: Adult Health

## 2016-04-04 ENCOUNTER — Ambulatory Visit (INDEPENDENT_AMBULATORY_CARE_PROVIDER_SITE_OTHER)
Admission: RE | Admit: 2016-04-04 | Discharge: 2016-04-04 | Disposition: A | Discharge status: Home / Self Care | Payer: Federal, State, Local not specified - PPO | Source: Ambulatory Visit | Attending: Adult Health | Admitting: Adult Health

## 2016-04-04 ENCOUNTER — Ambulatory Visit (INDEPENDENT_AMBULATORY_CARE_PROVIDER_SITE_OTHER): Payer: Federal, State, Local not specified - PPO | Admitting: Adult Health

## 2016-04-04 VITALS — BP 120/70 | HR 82 | Temp 98.4°F | Ht <= 58 in | Wt 176.0 lb

## 2016-04-04 DIAGNOSIS — J939 Pneumothorax, unspecified: Secondary | ICD-10-CM | POA: Diagnosis not present

## 2016-04-04 DIAGNOSIS — R9389 Abnormal findings on diagnostic imaging of other specified body structures: Secondary | ICD-10-CM

## 2016-04-04 DIAGNOSIS — R938 Abnormal findings on diagnostic imaging of other specified body structures: Secondary | ICD-10-CM

## 2016-04-04 NOTE — Assessment & Plan Note (Signed)
Right sided PTX after pelvic procedure, resolved with chest tube.  Serial CXR with no recurrence.

## 2016-04-04 NOTE — Progress Notes (Signed)
Reviewed and agree with assessment/plan.  Chesley Mires, MD [facility] Pulmonary/Critical Care 04/04/2016, 11:08 AM Pager:  [phone]

## 2016-04-04 NOTE — Patient Instructions (Signed)
Continue on current regimen  Set up CT chest at end of June-early July  Follow up with Dr. Halford Chessman  After CT chest in 2-3 months and As needed

## 2016-04-04 NOTE — Progress Notes (Signed)
Subjective:    Patient ID: [name], female    DOB: June 16, 1971, 45 y.o.   MRN: [id number]  HPI 45 yo female seen in 2014 for possible OHS/OSA with Dr. Halford Chessman  Found to have no sleep apnea on sleep study but nocturnal hypoxemia, started on nocturnal O2.   04/04/2016 [facility] follow up  Patient presents for a post hospital follow-up. Patient went for an elective myomectomy on 02/17/2016. She was a very difficult intubation per anesthesia. Postop she experience respiratory distress. Chest x-ray showed a large right pneumothorax with complete collapse. Chest tube was placed. And patient was transferred to [facility] ICU. Chest tube was removed on March 5. Patient was noted to have some bilateral infiltrates. CT chest showed a mildly enlarged precarinal lymph node measuring 11 mm. She's been recommended to have a follow-up CT chest in 3 months. 2-D echo showed an EF of 65-70% with normal diastolic function. Since discharge. Patient is feeling better but has not regained her stamina yet. Has some mild soreness of at chest tube site. CXR showed opacities have cleared with residual min opacity on lateral view.  She denies cough, chest pain , orthopnea or edema.       TESTS: 03/31/13 Echo >>EF [id number], Grade 2 diastolic dysfx [id number] CT chest >> dilated PA, no PE, patchy b/l infiltrates 05/05/13 CXR >> resolved pulmonary infiltrates PFT 05/22/13 >> FEV1 1.05 (57%), FEV1% 80, TLC 2.87 (73%), DLCO 107%, borderline BD response. PSG 06/25/13 >> AHI 1.7, RDI 5.6, SpO2 low 80%.  Needed 1 liter oxygen.   Past Medical History  Diagnosis Date  . Hypertension   . Diabetes mellitus without complication ([place])   . Hyperlipidemia 03/31/2013  . Bipolar disorder ([place]) 03/31/2013  . Vitamin D deficiency 03/31/2013  . Anemia microcytic 03/31/2013  . Rhabdomyolysis 03/31/2013    [place] secondary to abilify  . OSA (obstructive sleep apnea)   . Plantar fasciitis of left foot   . Pneumonia   . Pneumothorax   . CHF  (congestive heart failure) ([place])    Current Outpatient Prescriptions on File Prior to Visit  Medication Sig Dispense Refill  . amLODipine (NORVASC) 5 MG tablet Take 5 mg by mouth daily.    . divalproex (DEPAKOTE ER) 500 MG 24 hr tablet Take 500 mg by mouth daily.     . ferrous sulfate 325 (65 FE) MG tablet Take 325 mg by mouth daily with breakfast.    . furosemide (LASIX) 40 MG tablet Take 1 tablet (40 mg total) by mouth daily as needed (for weight gain of > 3 lbs in 24 hr w/ lower ext swelling). 30 tablet 6  . irbesartan (AVAPRO) 150 MG tablet Take 1 tablet (150 mg total) by mouth daily. 30 tablet 0  . lamoTRIgine (LAMICTAL) 200 MG tablet Take 100 mg by mouth daily. Takes 1/2 tablet daily    . metFORMIN (GLUCOPHAGE) 1000 MG tablet Take 1,000 mg by mouth 2 (two) times daily with a meal.    . Methylsulfonylmethane (MSM PO) Take 2 tablets by mouth daily.     . pravastatin (PRAVACHOL) 80 MG tablet Take 40 mg by mouth daily.    . traMADol (ULTRAM) 50 MG tablet Take 1 tablet (50 mg total) by mouth 2 (two) times daily as needed for severe pain. 20 tablet 0   No current facility-administered medications on file prior to visit.     Review of Systems Constitutional:   No  weight loss, night sweats,  Fevers, chills, fatigue,  or  lassitude.  HEENT:   No headaches,  Difficulty swallowing,  Tooth/dental problems, or  Sore throat,                No sneezing, itching, ear ache, nasal congestion, post nasal drip,   CV:  No chest pain,  Orthopnea, PND, swelling in lower extremities, anasarca, dizziness, palpitations, syncope.   GI  No heartburn, indigestion, abdominal pain, nausea, vomiting, diarrhea, change in bowel habits, loss of appetite, bloody stools.   Resp: No shortness of breath with exertion or at rest.  No excess mucus, no productive cough,  No non-productive cough,  No coughing up of blood.  No change in color of mucus.  No wheezing.  No chest wall deformity  Skin: no rash or  lesions.  GU: no dysuria, change in color of urine, no urgency or frequency.  No flank pain, no hematuria   MS:  No joint pain or swelling.  No decreased range of motion.  No back pain.  Psych:  No change in mood or affect. No depression or anxiety.  No memory loss.         Objective:   Physical Exam Filed Vitals:   04/04/16 0930  BP: 120/70  Pulse: 82  Temp: 98.4 F (36.9 C)  TempSrc: Oral  Height: 4\' 9"  (1.448 m)  Weight: 176 lb (79.833 kg)  SpO2: 96%   GEN: A/Ox3; pleasant , NAD, well nourished   HEENT:  [place]/AT,  EACs-clear, TMs-wnl, NOSE-clear, THROAT-clear, no lesions, no postnasal drip or exudate noted.   NECK:  Supple w/ fair ROM; no JVD; normal carotid impulses w/o bruits; no thyromegaly or nodules palpated; no lymphadenopathy.  RESP  Clear  P & A; w/o, wheezes/ rales/ or rhonchi.no accessory muscle use, no dullness to percussion , right sided CT site healed .   CARD:  RRR, no m/r/g  , no peripheral edema, pulses intact, no cyanosis or clubbing.  GI:   Soft & nt; nml bowel sounds; no organomegaly or masses detected.  Musco: Warm bil, no deformities or joint swelling noted.   Neuro: alert, no focal deficits noted.    Skin: Warm, no lesions or rashes  Hannibal Skalla NP-C  [facility]  04/04/2016        Assessment & Plan:

## 2016-04-04 NOTE — Assessment & Plan Note (Signed)
CT chest showed a mildly enlarged precarinal lymph node measuring 11 mm.  Recommend to have a follow-up CT chest in 3 months.

## 2016-05-16 ENCOUNTER — Other Ambulatory Visit: Payer: Self-pay | Admitting: Cardiothoracic Surgery

## 2016-05-16 DIAGNOSIS — J939 Pneumothorax, unspecified: Secondary | ICD-10-CM

## 2016-05-17 ENCOUNTER — Ambulatory Visit (INDEPENDENT_AMBULATORY_CARE_PROVIDER_SITE_OTHER): Payer: Federal, State, Local not specified - PPO | Admitting: Cardiothoracic Surgery

## 2016-05-17 ENCOUNTER — Ambulatory Visit
Admission: RE | Admit: 2016-05-17 | Discharge: 2016-05-17 | Disposition: A | Discharge status: Home / Self Care | Payer: Federal, State, Local not specified - PPO | Source: Ambulatory Visit | Attending: Cardiothoracic Surgery | Admitting: Cardiothoracic Surgery

## 2016-05-17 ENCOUNTER — Encounter: Payer: Self-pay | Admitting: Cardiothoracic Surgery

## 2016-05-17 VITALS — BP 144/101 | HR 91 | Resp 16 | Ht <= 58 in | Wt 175.0 lb

## 2016-05-17 DIAGNOSIS — Z9889 Other specified postprocedural states: Secondary | ICD-10-CM

## 2016-05-17 DIAGNOSIS — J939 Pneumothorax, unspecified: Secondary | ICD-10-CM

## 2016-05-17 NOTE — Progress Notes (Signed)
PCP is Horatio Pel, MD Referring Provider is Rush Farmer, MD  Chief Complaint  Patient presents with  . Spontaneous Pneumothorax    CXR prior, c/o increased pain at old chest tube site. Requesting to extend FMLA or extend her weight and work hours restrictions    HPIpatient returns for followup of a painful chest tube incision. 2 months ago the patient had a right pneumothorax after a difficult intubation for gynecologic procedure. Chest x-ray in recovery showed a large right pneumothorax and a chest tube was placed under local anesthesia. The air leak and pneumothorax have resolved. Her chest x-ray remains without pneumothorax. The incision itself is well-healed. The incision where the chest tube was placed is still somewhat sore and tender. I've recommended topical Vultaran twice a day when necessary:   Past Medical History  Diagnosis Date  . Hypertension   . Diabetes mellitus without complication ([place])   . Hyperlipidemia 03/31/2013  . Bipolar disorder ([place]) 03/31/2013  . Vitamin D deficiency 03/31/2013  . Anemia microcytic 03/31/2013  . Rhabdomyolysis 03/31/2013    [place] secondary to abilify  . OSA (obstructive sleep apnea)   . Plantar fasciitis of left foot   . Pneumonia   . Pneumothorax   . CHF (congestive heart failure) [facility])     Past Surgical History  Procedure Laterality Date  . Ventriculoperitoneal shunt    . Cesarean section      x2  . Cyst excision    . Dilatation & curettage/hysteroscopy with myosure N/A 02/17/2016    Procedure: DILATATION & CURETTAGE/HYSTEROSCOPY/Myomectomy WITH MYOSURE;  Surgeon: Aloha Gell, MD;  Location: [facility];  Service: Gynecology;  Laterality: N/A;  . Myomectomy N/A 02/17/2016    Procedure: Amado Coe WITH Jacklynn Barnacle;  Surgeon: Aloha Gell, MD;  Location: [facility];  Service: Gynecology;  Laterality: N/A;    Family History  Problem Relation Age of Onset  . Diabetes Mother   . Hyperlipidemia Mother   . Hypertension Mother   .  Lung cancer Father   . Hypertension Brother     Social History Social History  Substance Use Topics  . Smoking status: Never Smoker   . Smokeless tobacco: Never Used  . Alcohol Use: No    Current Outpatient Prescriptions  Medication Sig Dispense Refill  . amLODipine (NORVASC) 5 MG tablet Take 5 mg by mouth daily.    . divalproex (DEPAKOTE ER) 500 MG 24 hr tablet Take 500 mg by mouth daily.     . ferrous sulfate 325 (65 FE) MG tablet Take 325 mg by mouth daily with breakfast.    . furosemide (LASIX) 40 MG tablet Take 1 tablet (40 mg total) by mouth daily as needed (for weight gain of > 3 lbs in 24 hr w/ lower ext swelling). 30 tablet 6  . irbesartan (AVAPRO) 150 MG tablet Take 1 tablet (150 mg total) by mouth daily. 30 tablet 0  . lamoTRIgine (LAMICTAL) 200 MG tablet Take 100 mg by mouth daily. Takes 1/2 tablet daily    . metFORMIN (GLUCOPHAGE) 1000 MG tablet Take 1,000 mg by mouth 2 (two) times daily with a meal.    . Methylsulfonylmethane (MSM PO) Take 2 tablets by mouth daily.     . pravastatin (PRAVACHOL) 80 MG tablet Take 40 mg by mouth daily.    . traMADol (ULTRAM) 50 MG tablet Take 1 tablet (50 mg total) by mouth 2 (two) times daily as needed for severe pain. 20 tablet 0   No current facility-administered medications for this  visit.    Allergies  Allergen Reactions  . Abilify [Aripiprazole]     hyperglycemia    Review of Systems  Patient complains of a productive cough of brownish material was sinus congestion the past 5 days   BP 144/101 mmHg  Pulse 91  Resp 16  Ht 4\' 9"  (1.448 m)  Wt 175 lb (79.379 kg)  BMI 37.86 kg/m2  SpO2 93% Physical Exam Alert and comfortable Right chest tube site well-healed Breath sounds equal bilaterally but with some coarse breath sounds on the right  Diagnostic Tests: Increased lung markings-infiltrate in the right upper lobe  Impression: #1 painful right chest tube site-patient reassured that this is healing well and should be  helped with vultaran ointment  #2 probable bronchitis with productive cough and increased bronchial markings on x-ray-Z-Pak provided for patient and instructions provided.  Plan:patient will call if the pain persists despite topical analgesic. Would consider watch and wait or referral to pain clinic.   Len Childs, MD [facility] [phone]

## 2016-05-25 ENCOUNTER — Encounter (HOSPITAL_COMMUNITY): Payer: Self-pay | Admitting: Anesthesiology

## 2016-05-30 ENCOUNTER — Emergency Department (HOSPITAL_BASED_OUTPATIENT_CLINIC_OR_DEPARTMENT_OTHER)
Admission: EM | Admit: 2016-05-30 | Discharge: 2016-05-30 | Disposition: A | Discharge status: Home / Self Care | Payer: Federal, State, Local not specified - PPO | Attending: Emergency Medicine | Admitting: Emergency Medicine

## 2016-05-30 ENCOUNTER — Encounter (HOSPITAL_BASED_OUTPATIENT_CLINIC_OR_DEPARTMENT_OTHER): Payer: Self-pay

## 2016-05-30 DIAGNOSIS — Z79899 Other long term (current) drug therapy: Secondary | ICD-10-CM | POA: Insufficient documentation

## 2016-05-30 DIAGNOSIS — F319 Bipolar disorder, unspecified: Secondary | ICD-10-CM | POA: Diagnosis not present

## 2016-05-30 DIAGNOSIS — I110 Hypertensive heart disease with heart failure: Secondary | ICD-10-CM | POA: Diagnosis not present

## 2016-05-30 DIAGNOSIS — E785 Hyperlipidemia, unspecified: Secondary | ICD-10-CM | POA: Insufficient documentation

## 2016-05-30 DIAGNOSIS — I509 Heart failure, unspecified: Secondary | ICD-10-CM | POA: Insufficient documentation

## 2016-05-30 DIAGNOSIS — R112 Nausea with vomiting, unspecified: Secondary | ICD-10-CM | POA: Insufficient documentation

## 2016-05-30 DIAGNOSIS — Z7984 Long term (current) use of oral hypoglycemic drugs: Secondary | ICD-10-CM | POA: Insufficient documentation

## 2016-05-30 DIAGNOSIS — E11649 Type 2 diabetes mellitus with hypoglycemia without coma: Secondary | ICD-10-CM | POA: Diagnosis present

## 2016-05-30 LAB — CBG MONITORING, ED
GLUCOSE-CAPILLARY: 126 mg/dL — AB (ref 65–99)
GLUCOSE-CAPILLARY: 95 mg/dL (ref 65–99)

## 2016-05-30 MED ORDER — ONDANSETRON HCL 4 MG/2ML IJ SOLN
4.0000 mg | Freq: Once | INTRAMUSCULAR | Status: AC
Start: 2016-05-30 — End: 2016-05-30
  Administered 2016-05-30: 4 mg via INTRAVENOUS
  Filled 2016-05-30: qty 2

## 2016-05-30 MED ORDER — SODIUM CHLORIDE 0.9 % IV BOLUS (SEPSIS)
1000.0000 mL | Freq: Once | INTRAVENOUS | Status: AC
  Administered 2016-05-30: 1000 mL via INTRAVENOUS

## 2016-05-30 NOTE — ED Notes (Signed)
Pt states was at work and felt like her sugar dropped. States didn't eat when she took her metformin. States drank OJ and felt better; states also has a cold.

## 2016-05-30 NOTE — ED Notes (Signed)
Peanut butter and gram crackers given

## 2016-05-30 NOTE — ED Notes (Signed)
[place], MD at bedside.  

## 2016-05-30 NOTE — ED Provider Notes (Signed)
CSN: [id number]     Arrival date & time 05/30/16  0146 History   First MD Initiated Contact with Patient 05/30/16 0208     Chief Complaint  Patient presents with  . Hypoglycemia     (Consider location/radiation/quality/duration/timing/severity/associated sxs/prior Treatment) HPI  This is a 45 year old female with a history of diabetes. She went to work about 11:30 PM yesterday evening. She drank orange shoes on the way to work. About 1 AM she began to feel bad. Specifically she was nauseated, weak and vomited twice. She thought her blood sugar may have dropped but on arrival here it was 126. She has not had anything to eat since arriving to work. She is also had a cold recently with nasal congestion cough. She was treated with a for max and an unspecified cough medication.  Past Medical History  Diagnosis Date  . Hypertension   . Diabetes mellitus without complication ([place])   . Hyperlipidemia 03/31/2013  . Bipolar disorder ([place]) 03/31/2013  . Vitamin D deficiency 03/31/2013  . Anemia microcytic 03/31/2013  . Rhabdomyolysis 03/31/2013    [place] secondary to abilify  . OSA (obstructive sleep apnea)   . Plantar fasciitis of left foot   . Pneumonia   . Pneumothorax   . CHF (congestive heart failure) [facility])    Past Surgical History  Procedure Laterality Date  . Ventriculoperitoneal shunt    . Cesarean section      x2  . Cyst excision    . Dilatation & curettage/hysteroscopy with myosure N/A 02/17/2016    Procedure: DILATATION & CURETTAGE/HYSTEROSCOPY/Myomectomy WITH MYOSURE;  Surgeon: Aloha Gell, MD;  Location: [facility];  Service: Gynecology;  Laterality: N/A;  . Myomectomy N/A 02/17/2016    Procedure: Amado Coe WITH Jacklynn Barnacle;  Surgeon: Aloha Gell, MD;  Location: [facility];  Service: Gynecology;  Laterality: N/A;   Family History  Problem Relation Age of Onset  . Diabetes Mother   . Hyperlipidemia Mother   . Hypertension Mother   . Lung cancer Father   . Hypertension Brother     Social History  Substance Use Topics  . Smoking status: Never Smoker   . Smokeless tobacco: Never Used  . Alcohol Use: No   OB History    No data available     Review of Systems  All other systems reviewed and are negative.   Allergies  Abilify  Home Medications   Prior to Admission medications   Medication Sig Start Date End Date Taking? Authorizing Provider  amLODipine (NORVASC) 5 MG tablet Take 5 mg by mouth daily.    Historical Provider, MD  divalproex (DEPAKOTE ER) 500 MG 24 hr tablet Take 500 mg by mouth daily.     Historical Provider, MD  ferrous sulfate 325 (65 FE) MG tablet Take 325 mg by mouth daily with breakfast.    Historical Provider, MD  furosemide (LASIX) 40 MG tablet Take 1 tablet (40 mg total) by mouth daily as needed (for weight gain of > 3 lbs in 24 hr w/ lower ext swelling). 04/08/13   Erick Colace, NP  irbesartan (AVAPRO) 150 MG tablet Take 1 tablet (150 mg total) by mouth daily. 02/28/16   Reyne Dumas, MD  lamoTRIgine (LAMICTAL) 200 MG tablet Take 100 mg by mouth daily. Takes 1/2 tablet daily    Historical Provider, MD  metFORMIN (GLUCOPHAGE) 1000 MG tablet Take 1,000 mg by mouth 2 (two) times daily with a meal.    Historical Provider, MD  Methylsulfonylmethane (MSM PO) Take 2 tablets  by mouth daily.     Historical Provider, MD  pravastatin (PRAVACHOL) 80 MG tablet Take 40 mg by mouth daily.    Historical Provider, MD  traMADol (ULTRAM) 50 MG tablet Take 1 tablet (50 mg total) by mouth 2 (two) times daily as needed for severe pain. 11/14/15   Carmin Muskrat, MD   BP 178/114 mmHg  Pulse 102  Temp(Src) 99 F (37.2 C) (Oral)  Resp 20  SpO2 94%  LMP 05/12/2016   Physical Exam  General: Well-developed, well-nourished female in no acute distress; appearance consistent with age of record HENT: normocephalic; atraumatic Eyes: pupils equal, round and reactive to light; extraocular muscles intact Neck: supple Heart: regular rate and rhythm Lungs:  clear to auscultation bilaterally Abdomen: soft; nondistended; nontender; no masses or hepatosplenomegaly; bowel sounds present Extremities: No deformity; full range of motion; pulses normal Neurologic: Awake, alert and oriented; motor function intact in all extremities and symmetric; no facial droop Skin: Warm and dry Psychiatric: Normal mood and affect    ED Course  Procedures (including critical care time)   MDM   Nursing notes and vitals signs, including pulse oximetry, reviewed.  Summary of this visit's results, reviewed by myself:  Labs:  Results for orders placed or performed during the hospital encounter of 05/30/16 (from the past 24 hour(s))  CBG monitoring, ED     Status: Abnormal   Collection Time: 05/30/16  2:05 AM  Result Value Ref Range   Glucose-Capillary 126 (H) 65 - 99 mg/dL  CBG monitoring, ED     Status: None   Collection Time: 05/30/16  3:19 AM  Result Value Ref Range   Glucose-Capillary 95 65 - 99 mg/dL   3:32 AM Patient eating and drinking without emesis after IV fluids and Zofran.     Shanon Rosser, MD 05/30/16 [phone]

## 2016-05-30 NOTE — Discharge Instructions (Signed)
Nausea, Adult °Nausea is the feeling that you have an upset stomach or have to vomit. Nausea by itself is not likely a serious concern, but it may be an early sign of more serious medical problems. As nausea gets worse, it can lead to vomiting. If vomiting develops, there is the risk of dehydration.  °CAUSES  °· Viral infections. °· Food poisoning. °· Medicines. °· Pregnancy. °· Motion sickness. °· Migraine headaches. °· Emotional distress. °· Severe pain from any source. °· Alcohol intoxication. °HOME CARE INSTRUCTIONS °· Get plenty of rest. °· Ask your caregiver about specific rehydration instructions. °· Eat small amounts of food and sip liquids more often. °· Take all medicines as told by your caregiver. °SEEK MEDICAL CARE IF: °· You have not improved after 2 days, or you get worse. °· You have a headache. °SEEK IMMEDIATE MEDICAL CARE IF:  °· You have a fever. °· You faint. °· You keep vomiting or have blood in your vomit. °· You are extremely weak or dehydrated. °· You have dark or bloody stools. °· You have severe chest or abdominal pain. °MAKE SURE YOU: °· Understand these instructions. °· Will watch your condition. °· Will get help right away if you are not doing well or get worse. °  °This information is not intended to replace advice given to you by your health care provider. Make sure you discuss any questions you have with your health care provider. °  °Document Released: 01/11/2005 Document Revised: 12/25/2014 Document Reviewed: 08/16/2011 °Elsevier Interactive Patient Education ©2016 [employer] ° °

## 2016-06-12 ENCOUNTER — Other Ambulatory Visit: Payer: Self-pay | Admitting: Adult Health

## 2016-06-12 ENCOUNTER — Other Ambulatory Visit (INDEPENDENT_AMBULATORY_CARE_PROVIDER_SITE_OTHER): Payer: Federal, State, Local not specified - PPO

## 2016-06-12 DIAGNOSIS — J9601 Acute respiratory failure with hypoxia: Secondary | ICD-10-CM

## 2016-06-12 LAB — BASIC METABOLIC PANEL
BUN: 9 mg/dL (ref 6–23)
CHLORIDE: 101 meq/L (ref 96–112)
CO2: 30 mEq/L (ref 19–32)
Calcium: 9.4 mg/dL (ref 8.4–10.5)
Creatinine, Ser: 0.58 mg/dL (ref 0.40–1.20)
GFR: 143.69 mL/min (ref >60.00)
Glucose, Bld: 91 mg/dL (ref 70–99)
POTASSIUM: 4.1 meq/L (ref 3.5–5.1)
Sodium: 136 mEq/L (ref 135–145)

## 2016-06-13 ENCOUNTER — Ambulatory Visit (INDEPENDENT_AMBULATORY_CARE_PROVIDER_SITE_OTHER)
Admission: RE | Admit: 2016-06-13 | Discharge: 2016-06-13 | Disposition: A | Discharge status: Home / Self Care | Payer: Federal, State, Local not specified - PPO | Source: Ambulatory Visit | Attending: Adult Health | Admitting: Adult Health

## 2016-06-13 ENCOUNTER — Telehealth: Payer: Self-pay | Admitting: Pulmonary Disease

## 2016-06-13 DIAGNOSIS — J939 Pneumothorax, unspecified: Secondary | ICD-10-CM

## 2016-06-13 MED ORDER — IOPAMIDOL (ISOVUE-300) INJECTION 61%
80.0000 mL | Freq: Once | INTRAVENOUS | Status: AC | PRN
  Administered 2016-06-13: 80 mL via INTRAVENOUS

## 2016-06-13 NOTE — Telephone Encounter (Signed)
Spoke with Marzetta Board @ CT and she wanted to report that CT 06/13/16 shows increasing Right Breast mass.   TP and VS both are on vacation.   SN Please advise. Thanks!    Allergies  Allergen Reactions  . Abilify [Aripiprazole]     hyperglycemia   Current Outpatient Prescriptions on File Prior to Visit  Medication Sig Dispense Refill  . amLODipine (NORVASC) 5 MG tablet Take 5 mg by mouth daily.    . divalproex (DEPAKOTE ER) 500 MG 24 hr tablet Take 500 mg by mouth daily.     . ferrous sulfate 325 (65 FE) MG tablet Take 325 mg by mouth daily with breakfast.    . furosemide (LASIX) 40 MG tablet Take 1 tablet (40 mg total) by mouth daily as needed (for weight gain of > 3 lbs in 24 hr w/ lower ext swelling). 30 tablet 6  . irbesartan (AVAPRO) 150 MG tablet Take 1 tablet (150 mg total) by mouth daily. 30 tablet 0  . lamoTRIgine (LAMICTAL) 200 MG tablet Take 100 mg by mouth daily. Takes 1/2 tablet daily    . metFORMIN (GLUCOPHAGE) 1000 MG tablet Take 1,000 mg by mouth 2 (two) times daily with a meal.    . Methylsulfonylmethane (MSM PO) Take 2 tablets by mouth daily.     . pravastatin (PRAVACHOL) 80 MG tablet Take 40 mg by mouth daily.    . traMADol (ULTRAM) 50 MG tablet Take 1 tablet (50 mg total) by mouth 2 (two) times daily as needed for severe pain. 20 tablet 0   No current facility-administered medications on file prior to visit.

## 2016-06-13 NOTE — Telephone Encounter (Signed)
Pt had CT Chest w/ contrast done today (order from TP)> The lung findings included interval resolution of mediastinal adenopathy & bilat atelectasis previously noted. Incidental note made of 2 findings> a) distal tip of VP shunt is not in the peritoneal cavity;  b) 11 x16 mm nodule in upper right breast-- I called the Pt at [phone] and discussed this w/ her> she says she gets mammograms at [place] [facility] had a biopsy in the past... I called [facility] & their records indicate that she had a right breast bx of a 5 x 8 mm nodule in the 12:00 position 8cm from the nipple in 2013=> path showed a hamartoma.  Her last mammogram was 9.2016 and reported the right breast hamartoma was sl enlarged.  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 I relayed this info back to [name] & reminded her that f/u mammogram is due 08/2016... Copy of this sent to her PCP- DrPharr.

## 2016-06-14 NOTE — Progress Notes (Signed)
Quick Note:  Called spoke with pt. Reviewed results and recs. Pt voiced understanding and had no further questions. ______ 

## 2016-07-05 ENCOUNTER — Ambulatory Visit (INDEPENDENT_AMBULATORY_CARE_PROVIDER_SITE_OTHER): Payer: Federal, State, Local not specified - PPO | Admitting: Pulmonary Disease

## 2016-07-05 ENCOUNTER — Encounter: Payer: Self-pay | Admitting: Pulmonary Disease

## 2016-07-05 VITALS — BP 144/96 | HR 92 | Ht <= 58 in | Wt 179.0 lb

## 2016-07-05 DIAGNOSIS — E662 Morbid (severe) obesity with alveolar hypoventilation: Secondary | ICD-10-CM | POA: Diagnosis not present

## 2016-07-05 DIAGNOSIS — R918 Other nonspecific abnormal finding of lung field: Secondary | ICD-10-CM | POA: Diagnosis not present

## 2016-07-05 NOTE — Patient Instructions (Signed)
Will arrange for overnight oxygen test on room air Will call to arrange for follow up if needed after review of overnight oxygen test

## 2016-07-05 NOTE — Progress Notes (Signed)
Current Outpatient Prescriptions on File Prior to Visit  Medication Sig  . amLODipine (NORVASC) 5 MG tablet Take 5 mg by mouth daily.  . divalproex (DEPAKOTE ER) 500 MG 24 hr tablet Take 500 mg by mouth daily.   . ferrous sulfate 325 (65 FE) MG tablet Take 325 mg by mouth daily with breakfast.  . furosemide (LASIX) 40 MG tablet Take 1 tablet (40 mg total) by mouth daily as needed (for weight gain of > 3 lbs in 24 hr w/ lower ext swelling).  . irbesartan (AVAPRO) 150 MG tablet Take 1 tablet (150 mg total) by mouth daily.  Marland Kitchen lamoTRIgine (LAMICTAL) 200 MG tablet Take 100 mg by mouth daily. Takes 1/2 tablet daily  . metFORMIN (GLUCOPHAGE) 1000 MG tablet Take 1,000 mg by mouth 2 (two) times daily with a meal.  . Methylsulfonylmethane (MSM PO) Take 2 tablets by mouth daily.   . pravastatin (PRAVACHOL) 80 MG tablet Take 40 mg by mouth daily.  . traMADol (ULTRAM) 50 MG tablet Take 1 tablet (50 mg total) by mouth 2 (two) times daily as needed for severe pain.   No current facility-administered medications on file prior to visit.     Chief Complaint  Patient presents with  . Follow-up    Pt c/o increased SOB. Pt has not been sleeping with O2 at night - supposed to be using 1Liter    Pulmonary tests CT chest 03/31/13 >> dilated PA, no PE, patchy b/l infiltrates PFT 05/22/13 >> FEV1 1.05 (57%), FEV1% 80, TLC 2.87 (73%), DLCO 107%, borderline BD response CT chest 06/13/16 >> Precarinal LAN now 9 mm, scar RUL decreased in size, Rt pleural effusion resolved, resolved ATX   Sleep tests PSG 06/25/13 >> AHI 1.7, RDI 5.6, SpO2 low 80%. Needed 1 liter oxygen ONO with RA 09/12/13 >> Test time 4 hrs 19 min. Baseline SpO2 94%, low SpO2 79%. Spent 21 min with SpO2 < 89%.  Cardiac tests 03/31/13 Echo >>EF [id number], Grade 2 diastolic dysfx  Past medical hx HTN, DM, HLD, Bipolar, Vit D deficiency, Diastolic CHF, Rt PTX March 2017  Past surgical hx, Allergies, Family hx, Social hx all reviewed.  Vital Signs BP  144/96 mmHg  Pulse 92  Ht 4\' 9"  (1.448 m)  Wt 179 lb (81.194 kg)  BMI 38.72 kg/m2  SpO2 96%  History of Present Illness [name] is a 45 y.o. female with pulmonary infiltrates.  I last saw her in 2014.  She was in hospital earlier this year for Rt PTX, pulmonary infiltrates, and mediastinal LAN.  Her breathing has improved.  She denies chest pain, cough, or wheeze.  She gets winded when going up stairs or a hill.  She has not been using oxygen at night.  CT chest from 06/13/16 showed improvement in pulmonary infiltrates and LAN.  She has f/u mamogram for September.  Physical Exam  General - No distress ENT - No sinus tenderness, no oral exudate, no LAN, MP 4, enlarged tongue Cardiac - s1s2 regular, no murmur Chest - No wheeze/rales/dullness Back - No focal tenderness Abd - Soft, non-tender Ext - No edema Neuro - Normal strength Skin - No rashes Psych - normal mood, and behavior   Assessment/Plan  Pulmonary infiltrates and mediastinal LAN. - improved/resolved - no additional radiographic follow up needed  Obesity hypoventilation syndrome. - she has not been using supplemental oxygen - will arrange for room air overnight oximetery - explained how low oxygen can impact her health  Dyspnea. - likely from  obesity, deconditioning, and diastolic CHF   Patient Instructions  Will arrange for overnight oxygen test on room air Will call to arrange for follow up if needed after review of overnight oxygen test     Chesley Mires, MD [facility] Pager:  [phone] 07/05/2016, 10:42 AM

## 2016-08-08 ENCOUNTER — Encounter: Payer: Federal, State, Local not specified - PPO | Attending: Internal Medicine | Admitting: Skilled Nursing Facility1

## 2016-08-08 ENCOUNTER — Encounter: Payer: Self-pay | Admitting: Skilled Nursing Facility1

## 2016-08-08 DIAGNOSIS — Z713 Dietary counseling and surveillance: Secondary | ICD-10-CM | POA: Insufficient documentation

## 2016-08-08 DIAGNOSIS — I5030 Unspecified diastolic (congestive) heart failure: Secondary | ICD-10-CM | POA: Diagnosis not present

## 2016-08-08 DIAGNOSIS — E119 Type 2 diabetes mellitus without complications: Secondary | ICD-10-CM

## 2016-08-08 NOTE — Progress Notes (Signed)
Diabetes Self-Management Education  Visit Type: First/Initial  Appt. Start Time: 2:30 Appt. End Time: 4:00  08/08/2016  [name], identified by name and date of birth, is a 45 y.o. female with a diagnosis of Diabetes: Type 2.   ASSESSMENT  Height 4\' 9"  (1.448 m), weight 182 lb (82.6 kg). Body mass index is 39.38 kg/m.  Pt states she is afraid of needles. Pt states she is more of a napper and sleeps about 5-7 hours. Pt states her energy level is very and has no stamina as well as is out of breath often. Pt states she does have sleep apnea and does not use her C-PAP. Pt states she cannot pass on sweets. Pt states she works third shift. Pt states she tries to do the 3 C's to commitment.       Diabetes Self-Management Education - 08/08/16 1452      Visit Information   Visit Type First/Initial     Initial Visit   Diabetes Type Type 2   Are you currently following a meal plan? No   Are you taking your medications as prescribed? Yes   Date Diagnosed 2007     Health Coping   How would you rate your overall health? Fair     Psychosocial Assessment   Patient Belief/Attitude about Diabetes Denial   Self-care barriers None     Pre-Education Assessment   Patient understands the diabetes disease and treatment process. Needs Instruction   Patient understands incorporating nutritional management into lifestyle. Needs Instruction   Patient undertands incorporating physical activity into lifestyle. Needs Instruction   Patient understands using medications safely. Needs Instruction   Patient understands monitoring blood glucose, interpreting and using results Needs Instruction   Patient understands prevention, detection, and treatment of acute complications. Needs Instruction   Patient understands prevention, detection, and treatment of chronic complications. Needs Instruction   Patient understands how to develop strategies to address psychosocial issues. Needs Instruction   Patient understands how to develop strategies to promote health/change behavior. Needs Instruction     Complications   Last HgB A1C per patient/outside source 6.1 %   How often do you check your blood sugar? 0 times/day (not testing)   Have you had a dilated eye exam in the past 12 months? Yes   Have you had a dental exam in the past 12 months? Yes   Are you checking your feet? Yes   How many days per week are you checking your feet? 3     Dietary Intake   Breakfast pastries   Snack (morning) pastries   Lunch fast food   Snack (afternoon) chocolate   Dinner pasta, pork, carrots   Snack (evening) goodies   Beverage(s) coffee with cream and sugar, soda, sugar free waters     Exercise   Exercise Type ADL's     Patient Education   Previous Diabetes Education Yes (please comment)  2007   Disease state  Definition of diabetes, type 1 and 2, and the diagnosis of diabetes;Factors that contribute to the development of diabetes   Nutrition management  Role of diet in the treatment of diabetes and the relationship between the three main macronutrients and blood glucose level;Food label reading, portion sizes and measuring food.;Carbohydrate counting;Reviewed blood glucose goals for pre and post meals and how to evaluate the patients' food intake on their blood glucose level.;Information on hints to eating out and maintain blood glucose control.;Meal options for control of blood glucose level and chronic complications.  Physical activity and exercise  Role of exercise on diabetes management, blood pressure control and cardiac health.;Identified with patient nutritional and/or medication changes necessary with exercise.;Helped patient identify appropriate exercises in relation to his/her diabetes, diabetes complications and other health issue.   Monitoring Taught/evaluated SMBG meter.;Purpose and frequency of SMBG.;Yearly dilated eye exam;Daily foot exams;Identified appropriate SMBG and/or A1C goals.    Acute complications Taught treatment of hypoglycemia - the 15 rule.;Discussed and identified patients' treatment of hyperglycemia.   Chronic complications Assessed and discussed foot care and prevention of foot problems;Identified and discussed with patient  current chronic complications;Retinopathy and reason for yearly dilated eye exams;Dental care;Lipid levels, blood glucose control and heart disease   Psychosocial adjustment Worked with patient to identify barriers to care and solutions;Role of stress on diabetes     Individualized Goals (developed by patient)   Nutrition Follow meal plan discussed   Physical Activity Exercise 1-2 times per week;15 minutes per day   Monitoring  test blood glucose pre and post meals as discussed     Post-Education Assessment   Patient understands the diabetes disease and treatment process. Demonstrates understanding / competency   Patient understands incorporating nutritional management into lifestyle. Demonstrates understanding / competency   Patient undertands incorporating physical activity into lifestyle. Demonstrates understanding / competency   Patient understands using medications safely. Demonstrates understanding / competency   Patient understands monitoring blood glucose, interpreting and using results Demonstrates understanding / competency   Patient understands prevention, detection, and treatment of acute complications. Demonstrates understanding / competency   Patient understands prevention, detection, and treatment of chronic complications. Demonstrates understanding / competency   Patient understands how to develop strategies to address psychosocial issues. Demonstrates understanding / competency   Patient understands how to develop strategies to promote health/change behavior. Demonstrates understanding / competency     Outcomes   Expected Outcomes Demonstrated interest in learning. Expect positive outcomes   Future DMSE PRN   Program  Status Completed      Individualized Plan for Diabetes Self-Management Training:   Learning Objective:  Patient will have a greater understanding of diabetes self-management. Patient education plan is to attend individual and/or group sessions per assessed needs and concerns.   Plan:   Patient Instructions  -Always bring your meter with you everywhere you go -Always Properly dispose of your needles:  -Discard in a hard plastic/metal container with a lid (something the needle can't puncture)  -Write Do Not Recycle on the outside of the container  -Example: A laundry detergent bottle -Never use the same needle more than once -Eat 2-3 carbohydrate choices for each meal and 1 for each snack -A meal: carbohydrates, protein, vegetable -A snack: A Fruit OR Vegetable AND Protein  -Try to be more active -Always pay attention to your body keeping watchful of possible low blood sugar (below 70) or high blood sugar (above 200) -Try to test your blood sugar 5 times a week, once a day; testing at different times: sometimes right after you wake up from sleeping before you eat anything sometimes 2 hours after you eat -Talk with your doctor about another sleep study to get a new C-PAP -Before you eat: 70-130 -2 hours after you eat under 180 -Test on the side of your finger -Try to use the sugar substitutes in your coffee -   Expected Outcomes:  Demonstrated interest in learning. Expect positive outcomes  Education material provided: Living Well with Diabetes, Meal plan card, My Plate and Snack sheet  If problems or  questions, patient to contact team via:  Phone  Future DSME appointment: PRN

## 2016-08-08 NOTE — Patient Instructions (Addendum)
-  Always bring your meter with you everywhere you go -Always Properly dispose of your needles:  -Discard in a hard plastic/metal container with a lid (something the needle can't puncture)  -Write Do Not Recycle on the outside of the container  -Example: A laundry detergent bottle -Never use the same needle more than once -Eat 2-3 carbohydrate choices for each meal and 1 for each snack -A meal: carbohydrates, protein, vegetable -A snack: A Fruit OR Vegetable AND Protein  -Try to be more active -Always pay attention to your body keeping watchful of possible low blood sugar (below 70) or high blood sugar (above 200) -Try to test your blood sugar 5 times a week, once a day; testing at different times: sometimes right after you wake up from sleeping before you eat anything sometimes 2 hours after you eat -Talk with your doctor about another sleep study to get a new C-PAP -Before you eat: 70-130 -2 hours after you eat under 180 -Test on the side of your finger -Try to use the sugar substitutes in your coffee

## 2016-08-11 ENCOUNTER — Encounter: Payer: Self-pay | Admitting: Skilled Nursing Facility1

## 2016-09-06 ENCOUNTER — Emergency Department (HOSPITAL_COMMUNITY): Payer: Federal, State, Local not specified - PPO

## 2016-09-06 ENCOUNTER — Encounter (HOSPITAL_COMMUNITY): Payer: Self-pay | Admitting: Emergency Medicine

## 2016-09-06 ENCOUNTER — Inpatient Hospital Stay (HOSPITAL_COMMUNITY)
Admission: EM | Admit: 2016-09-06 | Discharge: 2016-09-11 | DRG: 190 | Disposition: A | Discharge status: Home / Self Care | Payer: Federal, State, Local not specified - PPO | Attending: Internal Medicine | Admitting: Internal Medicine

## 2016-09-06 DIAGNOSIS — J84116 Cryptogenic organizing pneumonia: Secondary | ICD-10-CM | POA: Diagnosis present

## 2016-09-06 DIAGNOSIS — Z6838 Body mass index (BMI) 38.0-38.9, adult: Secondary | ICD-10-CM

## 2016-09-06 DIAGNOSIS — Z9981 Dependence on supplemental oxygen: Secondary | ICD-10-CM

## 2016-09-06 DIAGNOSIS — Z23 Encounter for immunization: Secondary | ICD-10-CM

## 2016-09-06 DIAGNOSIS — T380X5A Adverse effect of glucocorticoids and synthetic analogues, initial encounter: Secondary | ICD-10-CM | POA: Diagnosis present

## 2016-09-06 DIAGNOSIS — R0902 Hypoxemia: Secondary | ICD-10-CM | POA: Diagnosis present

## 2016-09-06 DIAGNOSIS — J441 Chronic obstructive pulmonary disease with (acute) exacerbation: Secondary | ICD-10-CM | POA: Diagnosis present

## 2016-09-06 DIAGNOSIS — Z79899 Other long term (current) drug therapy: Secondary | ICD-10-CM

## 2016-09-06 DIAGNOSIS — J440 Chronic obstructive pulmonary disease with acute lower respiratory infection: Secondary | ICD-10-CM | POA: Diagnosis not present

## 2016-09-06 DIAGNOSIS — E039 Hypothyroidism, unspecified: Secondary | ICD-10-CM | POA: Diagnosis present

## 2016-09-06 DIAGNOSIS — I5033 Acute on chronic diastolic (congestive) heart failure: Secondary | ICD-10-CM | POA: Diagnosis present

## 2016-09-06 DIAGNOSIS — E118 Type 2 diabetes mellitus with unspecified complications: Secondary | ICD-10-CM | POA: Diagnosis present

## 2016-09-06 DIAGNOSIS — E785 Hyperlipidemia, unspecified: Secondary | ICD-10-CM | POA: Diagnosis present

## 2016-09-06 DIAGNOSIS — Z833 Family history of diabetes mellitus: Secondary | ICD-10-CM

## 2016-09-06 DIAGNOSIS — E1165 Type 2 diabetes mellitus with hyperglycemia: Secondary | ICD-10-CM | POA: Diagnosis present

## 2016-09-06 DIAGNOSIS — G4733 Obstructive sleep apnea (adult) (pediatric): Secondary | ICD-10-CM | POA: Diagnosis present

## 2016-09-06 DIAGNOSIS — F319 Bipolar disorder, unspecified: Secondary | ICD-10-CM | POA: Diagnosis present

## 2016-09-06 DIAGNOSIS — I5032 Chronic diastolic (congestive) heart failure: Secondary | ICD-10-CM | POA: Diagnosis present

## 2016-09-06 DIAGNOSIS — R0602 Shortness of breath: Secondary | ICD-10-CM

## 2016-09-06 DIAGNOSIS — R262 Difficulty in walking, not elsewhere classified: Secondary | ICD-10-CM

## 2016-09-06 DIAGNOSIS — Z7984 Long term (current) use of oral hypoglycemic drugs: Secondary | ICD-10-CM

## 2016-09-06 DIAGNOSIS — I10 Essential (primary) hypertension: Secondary | ICD-10-CM | POA: Diagnosis present

## 2016-09-06 DIAGNOSIS — Z9119 Patient's noncompliance with other medical treatment and regimen: Secondary | ICD-10-CM

## 2016-09-06 DIAGNOSIS — J449 Chronic obstructive pulmonary disease, unspecified: Secondary | ICD-10-CM | POA: Diagnosis present

## 2016-09-06 DIAGNOSIS — R6889 Other general symptoms and signs: Secondary | ICD-10-CM

## 2016-09-06 DIAGNOSIS — Z888 Allergy status to other drugs, medicaments and biological substances status: Secondary | ICD-10-CM

## 2016-09-06 DIAGNOSIS — I110 Hypertensive heart disease with heart failure: Secondary | ICD-10-CM | POA: Diagnosis present

## 2016-09-06 DIAGNOSIS — Z8249 Family history of ischemic heart disease and other diseases of the circulatory system: Secondary | ICD-10-CM

## 2016-09-06 LAB — CBC WITH DIFFERENTIAL/PLATELET
BASOS ABS: 0 10*3/uL (ref 0.0–0.1)
Basophils Relative: 0 %
Eosinophils Absolute: 0 10*3/uL (ref 0.0–0.7)
Eosinophils Relative: 0 %
HEMATOCRIT: 43.2 % (ref 36.0–46.0)
Hemoglobin: 12.1 g/dL (ref 12.0–15.0)
LYMPHS PCT: 8 %
Lymphs Abs: 0.9 10*3/uL (ref 0.7–4.0)
MCH: 24.9 pg — ABNORMAL LOW (ref 26.0–34.0)
MCHC: 28 g/dL — ABNORMAL LOW (ref 30.0–36.0)
MCV: 89.1 fL (ref 78.0–100.0)
MONO ABS: 0.6 10*3/uL (ref 0.1–1.0)
MONOS PCT: 5 %
NEUTROS ABS: 9.7 10*3/uL — AB (ref 1.7–7.7)
Neutrophils Relative %: 87 %
Platelets: 425 10*3/uL — ABNORMAL HIGH (ref 150–400)
RBC: 4.85 MIL/uL (ref 3.87–5.11)
RDW: 16.8 % — AB (ref 11.5–15.5)
WBC: 11.3 10*3/uL — ABNORMAL HIGH (ref 4.0–10.5)

## 2016-09-06 LAB — COMPREHENSIVE METABOLIC PANEL
ALT: 329 U/L — ABNORMAL HIGH (ref 14–54)
AST: 100 U/L — AB (ref 15–41)
Albumin: 3.2 g/dL — ABNORMAL LOW (ref 3.5–5.0)
Alkaline Phosphatase: 77 U/L (ref 38–126)
Anion gap: 6 (ref 5–15)
BILIRUBIN TOTAL: 0.7 mg/dL (ref 0.3–1.2)
BUN: 15 mg/dL (ref 6–20)
CO2: 33 mmol/L — ABNORMAL HIGH (ref 22–32)
CREATININE: 0.74 mg/dL (ref 0.44–1.00)
Calcium: 8.6 mg/dL — ABNORMAL LOW (ref 8.9–10.3)
Chloride: 100 mmol/L — ABNORMAL LOW (ref 101–111)
GFR calc Af Amer: >60 mL/min (ref >60)
Glucose, Bld: 237 mg/dL — ABNORMAL HIGH (ref 65–99)
POTASSIUM: 3.9 mmol/L (ref 3.5–5.1)
Sodium: 139 mmol/L (ref 135–145)
TOTAL PROTEIN: 6.5 g/dL (ref 6.5–8.1)

## 2016-09-06 LAB — I-STAT ARTERIAL BLOOD GAS, ED
Acid-Base Excess: 7 mmol/L — ABNORMAL HIGH (ref 0.0–2.0)
BICARBONATE: 32.9 mmol/L — AB (ref 20.0–28.0)
O2 SAT: 96 %
PCO2 ART: 52.5 mmHg — AB (ref 32.0–48.0)
PO2 ART: 82 mmHg — AB (ref 83.0–108.0)
Patient temperature: 98.4
TCO2: 35 mmol/L (ref 0–100)
pH, Arterial: 7.405 (ref 7.350–7.450)

## 2016-09-06 LAB — GLUCOSE, CAPILLARY: Glucose-Capillary: 409 mg/dL — ABNORMAL HIGH (ref 65–99)

## 2016-09-06 LAB — D-DIMER, QUANTITATIVE (NOT AT ARMC): D DIMER QUANT: 2.53 ug{FEU}/mL — AB (ref 0.00–0.50)

## 2016-09-06 LAB — I-STAT TROPONIN, ED: Troponin i, poc: 0.01 ng/mL (ref 0.00–0.08)

## 2016-09-06 LAB — BRAIN NATRIURETIC PEPTIDE: B NATRIURETIC PEPTIDE 5: 162.6 pg/mL — AB (ref 0.0–100.0)

## 2016-09-06 LAB — VALPROIC ACID LEVEL: Valproic Acid Lvl: <10 ug/mL — ABNORMAL LOW (ref 50.0–100.0)

## 2016-09-06 LAB — I-STAT BETA HCG BLOOD, ED (MC, WL, AP ONLY)

## 2016-09-06 MED ORDER — ENOXAPARIN SODIUM 40 MG/0.4ML ~~LOC~~ SOLN
40.0000 mg | Freq: Every day | SUBCUTANEOUS | Status: DC
  Administered 2016-09-07 – 2016-09-11 (×5): 40 mg via SUBCUTANEOUS
  Filled 2016-09-06 (×5): qty 0.4

## 2016-09-06 MED ORDER — PRAVASTATIN SODIUM 40 MG PO TABS: 40.0000 mg | ORAL_TABLET | Freq: Every day | ORAL | Status: DC

## 2016-09-06 MED ORDER — METFORMIN HCL 500 MG PO TABS: 1000.0000 mg | ORAL_TABLET | Freq: Two times a day (BID) | ORAL | Status: DC

## 2016-09-06 MED ORDER — ALBUTEROL (5 MG/ML) CONTINUOUS INHALATION SOLN
5.0000 mg/h | INHALATION_SOLUTION | Freq: Once | RESPIRATORY_TRACT | Status: AC
  Administered 2016-09-06: 5 mg/h via RESPIRATORY_TRACT
  Filled 2016-09-06: qty 20

## 2016-09-06 MED ORDER — FERROUS SULFATE 325 (65 FE) MG PO TABS
325.0000 mg | ORAL_TABLET | Freq: Every day | ORAL | Status: DC
  Administered 2016-09-07 – 2016-09-11 (×5): 325 mg via ORAL
  Filled 2016-09-06 (×5): qty 1

## 2016-09-06 MED ORDER — INSULIN ASPART 100 UNIT/ML ~~LOC~~ SOLN: 0.0000 [IU] | Freq: Three times a day (TID) | SUBCUTANEOUS | Status: DC

## 2016-09-06 MED ORDER — INTEGRA PLUS PO CAPS: 1.0000 | ORAL_CAPSULE | Freq: Every day | ORAL | Status: DC

## 2016-09-06 MED ORDER — IPRATROPIUM-ALBUTEROL 0.5-2.5 (3) MG/3ML IN SOLN
3.0000 mL | Freq: Once | RESPIRATORY_TRACT | Status: AC
  Administered 2016-09-06: 3 mL via RESPIRATORY_TRACT
  Filled 2016-09-06: qty 3

## 2016-09-06 MED ORDER — AZITHROMYCIN 500 MG IV SOLR
500.0000 mg | Freq: Once | INTRAVENOUS | Status: AC
  Administered 2016-09-06: 500 mg via INTRAVENOUS
  Filled 2016-09-06: qty 500

## 2016-09-06 MED ORDER — LAMOTRIGINE 100 MG PO TABS
100.0000 mg | ORAL_TABLET | Freq: Every day | ORAL | Status: DC
  Administered 2016-09-07 – 2016-09-11 (×5): 100 mg via ORAL
  Filled 2016-09-06 (×5): qty 1

## 2016-09-06 MED ORDER — FUROSEMIDE 40 MG PO TABS: 40.0000 mg | ORAL_TABLET | Freq: Every day | ORAL | Status: DC | PRN

## 2016-09-06 MED ORDER — LEVOFLOXACIN IN D5W 750 MG/150ML IV SOLN
750.0000 mg | Freq: Every day | INTRAVENOUS | Status: DC
  Administered 2016-09-07 – 2016-09-08 (×3): 750 mg via INTRAVENOUS
  Filled 2016-09-06 (×3): qty 150

## 2016-09-06 MED ORDER — FUROSEMIDE 10 MG/ML IJ SOLN
60.0000 mg | Freq: Once | INTRAMUSCULAR | Status: AC
  Administered 2016-09-06: 60 mg via INTRAVENOUS
  Filled 2016-09-06: qty 6

## 2016-09-06 MED ORDER — SODIUM CHLORIDE 0.9% FLUSH
3.0000 mL | Freq: Two times a day (BID) | INTRAVENOUS | Status: DC
  Administered 2016-09-07 – 2016-09-11 (×9): 3 mL via INTRAVENOUS

## 2016-09-06 MED ORDER — LEVOTHYROXINE SODIUM 50 MCG PO TABS
50.0000 ug | ORAL_TABLET | Freq: Every day | ORAL | Status: DC
  Administered 2016-09-07 – 2016-09-11 (×5): 50 ug via ORAL
  Filled 2016-09-06 (×5): qty 1

## 2016-09-06 MED ORDER — METHYLPREDNISOLONE SODIUM SUCC 125 MG IJ SOLR
80.0000 mg | Freq: Four times a day (QID) | INTRAMUSCULAR | Status: DC
  Administered 2016-09-07 – 2016-09-08 (×6): 80 mg via INTRAVENOUS
  Filled 2016-09-06 (×5): qty 2

## 2016-09-06 MED ORDER — DIVALPROEX SODIUM ER 500 MG PO TB24
500.0000 mg | ORAL_TABLET | Freq: Every day | ORAL | Status: DC
  Administered 2016-09-07 – 2016-09-11 (×5): 500 mg via ORAL
  Filled 2016-09-06 (×5): qty 1

## 2016-09-06 MED ORDER — AMLODIPINE BESYLATE 5 MG PO TABS
5.0000 mg | ORAL_TABLET | Freq: Every day | ORAL | Status: DC
  Administered 2016-09-07 – 2016-09-11 (×5): 5 mg via ORAL
  Filled 2016-09-06 (×5): qty 1

## 2016-09-06 MED ORDER — ADULT MULTIVITAMIN W/MINERALS CH
1.0000 | ORAL_TABLET | Freq: Every day | ORAL | Status: DC
Start: 2016-09-07 — End: 2016-09-11
  Administered 2016-09-07 – 2016-09-11 (×5): 1 via ORAL
  Filled 2016-09-06 (×5): qty 1

## 2016-09-06 MED ORDER — ALBUTEROL SULFATE (2.5 MG/3ML) 0.083% IN NEBU: 2.5000 mg | INHALATION_SOLUTION | RESPIRATORY_TRACT | Status: DC | PRN

## 2016-09-06 MED ORDER — TRAMADOL HCL 50 MG PO TABS: 50.0000 mg | ORAL_TABLET | Freq: Two times a day (BID) | ORAL | Status: DC | PRN

## 2016-09-06 MED ORDER — IOPAMIDOL (ISOVUE-370) INJECTION 76%
INTRAVENOUS | Status: AC
  Administered 2016-09-06: 100 mL
  Filled 2016-09-06: qty 100

## 2016-09-06 MED ORDER — IPRATROPIUM-ALBUTEROL 0.5-2.5 (3) MG/3ML IN SOLN
3.0000 mL | Freq: Four times a day (QID) | RESPIRATORY_TRACT | Status: DC
  Administered 2016-09-07 – 2016-09-08 (×6): 3 mL via RESPIRATORY_TRACT
  Filled 2016-09-06 (×8): qty 3

## 2016-09-06 MED ORDER — IRBESARTAN 300 MG PO TABS
300.0000 mg | ORAL_TABLET | Freq: Every day | ORAL | Status: DC
  Administered 2016-09-07 – 2016-09-11 (×5): 300 mg via ORAL
  Filled 2016-09-06 (×5): qty 1

## 2016-09-06 NOTE — ED Provider Notes (Signed)
[facility] DEPT Provider Note   CSN: [id number] Arrival date & time: 09/06/16  1251     History   Chief Complaint Chief Complaint  Patient presents with  . Shortness of Breath    HPI  Blood pressure 135/80, pulse 93, temperature 98.4 F (36.9 C), temperature source Oral, resp. rate 20, SpO2 100 %.  [name] is a 45 y.o. female with past medical history significant for diabetes, hypertension, hyperlipidemia, dCHF, asthma, history of complete right-sided pneumothorax , difficult intubation complaining of 3 weeks of worsening shortness of breath, fatigue and dyspnea on exertion. She denies chest pain, fever, chills, syncope, increasing peripheral edema, abdominal pain, change in her bowel or bladder habits. She endorses worsening orthopnea and PND. Patient brought in by EMS from primary care with low oxygen saturation of 78% on RA 67% walking, patient does have home oxygen but she never uses it, she was given 20 mg of albuterol, at 125 of Solu-Medrol and 2 g of mag and route with some improvement but she reports persistent shortness of breath. Patient denies history of DVT/PE, cancer, recent immobilizations, hormonal birth control (has IUD), calf pain, hemoptysis. Patient has not taken her Lasix because she reported it was only communicated to her that she should only take it for increasing peripheral edema, she doesn't know what her dry weight is, she doesn't weigh herself to monitor her CHF.  Cards: [place]   HPI  Past Medical History:  Diagnosis Date  . Anemia microcytic 03/31/2013  . Bipolar disorder ([place]) 03/31/2013  . CHF (congestive heart failure) ([place])   . Diabetes mellitus without complication ([place])   . Hyperlipidemia 03/31/2013  . Hypertension   . OSA (obstructive sleep apnea)   . Plantar fasciitis of left foot   . Pneumonia   . Pneumothorax   . Rhabdomyolysis 03/31/2013   [place] secondary to abilify  . Vitamin D deficiency 03/31/2013    Patient Active Problem  List   Diagnosis Date Noted  . Abnormal CT scan, chest 04/04/2016  . Morbid obesity ([place]) 03/14/2016  . Hypoxemia   . Pneumothorax   . Urinary tract infection, site not specified   . Chronic diastolic CHF (congestive heart failure) ([place])   . Type 2 diabetes mellitus with complication ([place])   . Essential hypertension   . Affective psychosis, bipolar ([place])   . Uterine fibroids affecting pregnancy   . Acute respiratory failure ([place]) 02/17/2016  . Hypoxemic respiratory failure, chronic ([place]) 02/17/2016  . Diastolic CHF, chronic ([place]) 04/02/2013  . Obesity hypoventilation syndrome ([place]) 04/02/2013  . Abnormal EKG 03/31/2013  . HTN (hypertension) 03/31/2013  . Hyperlipidemia 03/31/2013  . Diabetes ([place]) 03/31/2013  . Vitamin D deficiency 03/31/2013  . Anemia 03/31/2013  . Bipolar I disorder, most recent episode (or current) manic ([place]) 03/17/2013    Past Surgical History:  Procedure Laterality Date  . CESAREAN SECTION     x2  . CYST EXCISION    . DILATATION & CURETTAGE/HYSTEROSCOPY WITH MYOSURE N/A 02/17/2016   Procedure: DILATATION & CURETTAGE/HYSTEROSCOPY/Myomectomy WITH MYOSURE;  Surgeon: Aloha Gell, MD;  Location: [facility];  Service: Gynecology;  Laterality: N/A;  . MYOMECTOMY N/A 02/17/2016   Procedure: Amado Coe WITH MYOSURE;  Surgeon: Aloha Gell, MD;  Location: [facility];  Service: Gynecology;  Laterality: N/A;  . VENTRICULOPERITONEAL SHUNT      OB History    No data available       Home Medications    Prior to Admission medications   Medication Sig Start Date End  Date Taking? Authorizing Provider  amLODipine (NORVASC) 5 MG tablet Take 5 mg by mouth daily.   Yes Historical Provider, MD  divalproex (DEPAKOTE ER) 500 MG 24 hr tablet Take 500 mg by mouth daily.    Yes Historical Provider, MD  FeFum-FePoly-FA-B Cmp-C-Biot (INTEGRA PLUS) CAPS Take 1 capsule by mouth daily. 06/29/16  Yes Historical Provider, MD  ferrous sulfate 325 (65 FE) MG tablet Take 325 mg by mouth  daily with breakfast.   Yes Historical Provider, MD  furosemide (LASIX) 40 MG tablet Take 1 tablet (40 mg total) by mouth daily as needed (for weight gain of > 3 lbs in 24 hr w/ lower ext swelling). 04/08/13  Yes Erick Colace, NP  irbesartan (AVAPRO) 300 MG tablet Take 300 mg by mouth daily. 08/18/16  Yes Historical Provider, MD  lamoTRIgine (LAMICTAL) 200 MG tablet Take 100 mg by mouth daily.    Yes Historical Provider, MD  levothyroxine (SYNTHROID, LEVOTHROID) 50 MCG tablet Take 50 mcg by mouth daily before breakfast. 07/07/16  Yes Historical Provider, MD  metFORMIN (GLUCOPHAGE) 1000 MG tablet Take 1,000 mg by mouth 2 (two) times daily with a meal.   Yes Historical Provider, MD  Methylsulfonylmethane (MSM PO) Take 2 tablets by mouth daily.    Yes Historical Provider, MD  Multiple Vitamin (MULTIVITAMIN) LIQD Take 5 mLs by mouth daily.   Yes Historical Provider, MD  pravastatin (PRAVACHOL) 80 MG tablet Take 40 mg by mouth daily.   Yes Historical Provider, MD  traMADol (ULTRAM) 50 MG tablet Take 1 tablet (50 mg total) by mouth 2 (two) times daily as needed for severe pain. Patient taking differently: Take 50 mg by mouth 2 (two) times daily as needed for severe pain (migraines).  11/14/15  Yes Carmin Muskrat, MD  irbesartan (AVAPRO) 150 MG tablet Take 1 tablet (150 mg total) by mouth daily. Patient not taking: Reported on 09/06/2016 02/28/16   Reyne Dumas, MD    Family History Family History  Problem Relation Age of Onset  . Diabetes Mother   . Hyperlipidemia Mother   . Hypertension Mother   . Lung cancer Father   . Hypertension Brother     Social History Social History  Substance Use Topics  . Smoking status: Never Smoker  . Smokeless tobacco: Never Used  . Alcohol use No     Allergies   Abilify [aripiprazole]   Review of Systems Review of Systems  10 systems reviewed and found to be negative, except as noted in the HPI.  Physical Exam Updated Vital Signs BP 140/73   Pulse  95   Temp 98.4 F (36.9 C) (Oral)   Resp 24   LMP 08/23/2016 (Exact Date)   SpO2 (!) 83%   Physical Exam  Constitutional: She is oriented to person, place, and time. She appears well-developed and well-nourished. No distress.  HENT:  Head: Normocephalic and atraumatic.  Mouth/Throat: Oropharynx is clear and moist.  Eyes: Conjunctivae and EOM are normal. Pupils are equal, round, and reactive to light.  Neck: Normal range of motion. No tracheal deviation present.  Mild JVD  Cardiovascular: Normal rate, regular rhythm and intact distal pulses.   Radial pulse equal bilaterally  Pulmonary/Chest: Effort normal. No stridor. No respiratory distress. She has wheezes. She has no rales. She exhibits no tenderness.  Bilateral expiratory wheezing, reduced air movement at the bases  Abdominal: Soft. She exhibits no distension and no mass. There is no tenderness. There is no rebound and no guarding.  Musculoskeletal: Normal  range of motion. She exhibits no edema or tenderness.  No calf asymmetry, superficial collaterals, palpable cords, edema, Homans sign negative bilaterally.    Neurological: She is alert and oriented to person, place, and time.  Skin: Skin is warm. She is not diaphoretic.  Psychiatric: She has a normal mood and affect.  Nursing note and vitals reviewed.    ED Treatments / Results  Labs (all labs ordered are listed, but only abnormal results are displayed) Labs Reviewed  BRAIN NATRIURETIC PEPTIDE - Abnormal; Notable for the following:       Result Value   B Natriuretic Peptide 162.6 (*)    All other components within normal limits  CBC WITH DIFFERENTIAL/PLATELET - Abnormal; Notable for the following:    WBC 11.3 (*)    MCH 24.9 (*)    MCHC 28.0 (*)    RDW 16.8 (*)    Platelets 425 (*)    Neutro Abs 9.7 (*)    All other components within normal limits  COMPREHENSIVE METABOLIC PANEL - Abnormal; Notable for the following:    Chloride 100 (*)    CO2 33 (*)    Glucose,  Bld 237 (*)    Calcium 8.6 (*)    Albumin 3.2 (*)    AST 100 (*)    ALT 329 (*)    All other components within normal limits  D-DIMER, QUANTITATIVE (NOT AT [facility]) - Abnormal; Notable for the following:    D-Dimer, Quant 2.53 (*)    All other components within normal limits  VALPROIC ACID LEVEL  I-STAT TROPOININ, ED  I-STAT BETA HCG BLOOD, ED ([facility], [facility], [facility] ONLY)  I-STAT ARTERIAL BLOOD GAS, ED    EKG  EKG Interpretation  Date/Time:  Wednesday September 06 2016 12:59:49 EDT Ventricular Rate:  93 PR Interval:    QRS Duration: 82 QT Interval:  338 QTC Calculation: 421 R Axis:   107 Text Interpretation:  Age not entered, assumed to be  45 years old for purpose of ECG interpretation Sinus rhythm Probable anterior infarct, age indeterminate ? new flipped t waves in inferior leads Otherwise no significant change Confirmed by FLOYD MD, DANIEL [phone]) on 09/06/2016 4:02:58 PM       Radiology Dg Chest Port 1 View  Result Date: 09/06/2016 CLINICAL DATA:  Short of breath EXAM: PORTABLE CHEST 1 VIEW COMPARISON:  05/17/2016 FINDINGS: Catheter tubing overlying the right chest is unchanged. Heart size upper normal. Negative for heart failure or edema. No pleural effusion. Patchy density right mid lung has resolved and may have been atelectasis or pneumonia on the prior study. Lungs are now clear. IMPRESSION: No active disease. Electronically Signed   By: Franchot Gallo M.D.   On: 09/06/2016 13:19    Procedures Procedures (including critical care time)  Medications Ordered in ED Medications  ipratropium-albuterol (DUONEB) 0.5-2.5 (3) MG/3ML nebulizer solution 3 mL (not administered)  albuterol (PROVENTIL,VENTOLIN) solution continuous neb (5 mg/hr Nebulization Given 09/06/16 1330)  furosemide (LASIX) injection 60 mg (60 mg Intravenous Given 09/06/16 1443)     Initial Impression / Assessment and Plan / ED Course  I have reviewed the triage vital signs and the nursing notes.  Pertinent labs &  imaging results that were available during my care of the patient were reviewed by me and considered in my medical decision making (see chart for details).  Clinical Course    Vitals:   09/06/16 1445 09/06/16 1515 09/06/16 1530 09/06/16 1545  BP: 121/74 127/80 137/73 140/73  Pulse: 99 89  96 95  Resp: (!) 28 25 23 24   Temp:      TempSrc:      SpO2: (!) 89% 96% 91% (!) 83%    Medications  ipratropium-albuterol (DUONEB) 0.5-2.5 (3) MG/3ML nebulizer solution 3 mL (not administered)  albuterol (PROVENTIL,VENTOLIN) solution continuous neb (5 mg/hr Nebulization Given 09/06/16 1330)  furosemide (LASIX) injection 60 mg (60 mg Intravenous Given 09/06/16 1443)    [name] is 45 y.o. female presenting with Shortness of breath, patient was found to be hypoxic at her at her primary care, she's been having these symptoms for 3 weeks, patient has received aggressive treatment for asthma/COPD she received 2 g of magnesium, 125 of Solu-Medrol and 20 mg of albuterol, I have given this woman and hour long nebulizer and she still remained short of breath and hypoxic. She has a history of CHF and does not take any diuretics. No risk factors for PE however etiology of her hypoxia is unclear, d-dimer is ordered.  D-dimer is mildly elevated, ABG ordered and pending. CTA pending. Case signed out to attending physician.    Final Clinical Impressions(s) / ED Diagnoses   Final diagnoses:  None    New Prescriptions New Prescriptions   No medications on file     Monico Blitz, PA-C 09/09/16 [place], MD 09/09/16 1353

## 2016-09-06 NOTE — ED Notes (Signed)
Pt to CT at this time.

## 2016-09-06 NOTE — H&P (Signed)
History and Physical    [name] [id number] DOB: [birth year] DOA: 09/06/2016  PCP: Horatio Pel, MD   Patient coming from: PCPs office.  Chief Complaint: Shortness of breath.  HPI: [name] is a 45 y.o. female with medical history significant of microcytic anemia, bipolar disorder, diastolic CHF, type 2 diabetes, hyperlipidemia, hypertension, obstructive sleep apnea, history of pneumonia, history of pneumothorax, difficult intubation, history of rhabdomyolysis, vitamin D deficiency who is coming to the emergency department via EMS due to progressively worse shortness of breath for the past 3 weeks.   Per patient, she has been feeling progressive fatigue and dyspnea for the past 3 weeks. She complains of orthopnea and PND, but denies fever, chills, chest pain, palpitations, dizziness, diaphoresis, pitting edema of the lower extremities. She went today to see her PCP and was referred to the emergency department via EMS due to hypoxia of 78% on room air while resting and 67% while walking. She was given albuterol nebulizers, Solu-Medrol and magnesium sulfate on the way to the hospital.  ED Course: The patient was administered supplemental oxygen, nebulized bronchodilators, IV furosemide, IV methylprednisolone, azithromycin reporting partial relief of symptoms. WBC were 11.3, platelets 425, d-dimer 2.53 and transaminitis. CT chest shows ground glass attenuation of upper lobes.   Review of Systems: As per HPI otherwise 10 point review of systems negative.    Past Medical History:  Diagnosis Date  . Anemia microcytic 03/31/2013  . Bipolar disorder ([place]) 03/31/2013  . CHF (congestive heart failure) ([place])   . Diabetes mellitus without complication ([place])   . Hyperlipidemia 03/31/2013  . Hypertension   . OSA (obstructive sleep apnea)   . Plantar fasciitis of left foot   . Pneumonia   . Pneumothorax   . Rhabdomyolysis 03/31/2013   [place] secondary to abilify  . Vitamin D  deficiency 03/31/2013    Past Surgical History:  Procedure Laterality Date  . CESAREAN SECTION     x2  . CYST EXCISION    . DILATATION & CURETTAGE/HYSTEROSCOPY WITH MYOSURE N/A 02/17/2016   Procedure: DILATATION & CURETTAGE/HYSTEROSCOPY/Myomectomy WITH MYOSURE;  Surgeon: Aloha Gell, MD;  Location: [facility];  Service: Gynecology;  Laterality: N/A;  . MYOMECTOMY N/A 02/17/2016   Procedure: Amado Coe WITH MYOSURE;  Surgeon: Aloha Gell, MD;  Location: [facility];  Service: Gynecology;  Laterality: N/A;  . VENTRICULOPERITONEAL SHUNT       reports that she has never smoked. She has never used smokeless tobacco. She reports that she does not drink alcohol or use drugs.  Allergies  Allergen Reactions  . Abilify [Aripiprazole]     hyperglycemia    Family History  Problem Relation Age of Onset  . Diabetes Mother   . Hyperlipidemia Mother   . Hypertension Mother   . Lung cancer Father   . Hypertension Brother      Prior to Admission medications   Medication Sig Start Date End Date Taking? Authorizing Provider  amLODipine (NORVASC) 5 MG tablet Take 5 mg by mouth daily.   Yes Historical Provider, MD  divalproex (DEPAKOTE ER) 500 MG 24 hr tablet Take 500 mg by mouth daily.    Yes Historical Provider, MD  FeFum-FePoly-FA-B Cmp-C-Biot (INTEGRA PLUS) CAPS Take 1 capsule by mouth daily. 06/29/16  Yes Historical Provider, MD  ferrous sulfate 325 (65 FE) MG tablet Take 325 mg by mouth daily with breakfast.   Yes Historical Provider, MD  furosemide (LASIX) 40 MG tablet Take 1 tablet (40 mg total) by mouth daily as needed (for weight  gain of > 3 lbs in 24 hr w/ lower ext swelling). 04/08/13  Yes Erick Colace, NP  irbesartan (AVAPRO) 300 MG tablet Take 300 mg by mouth daily. 08/18/16  Yes Historical Provider, MD  lamoTRIgine (LAMICTAL) 200 MG tablet Take 100 mg by mouth daily.    Yes Historical Provider, MD  levothyroxine (SYNTHROID, LEVOTHROID) 50 MCG tablet Take 50 mcg by mouth daily before  breakfast. 07/07/16  Yes Historical Provider, MD  metFORMIN (GLUCOPHAGE) 1000 MG tablet Take 1,000 mg by mouth 2 (two) times daily with a meal.   Yes Historical Provider, MD  Methylsulfonylmethane (MSM PO) Take 2 tablets by mouth daily.    Yes Historical Provider, MD  Multiple Vitamin (MULTIVITAMIN) LIQD Take 5 mLs by mouth daily.   Yes Historical Provider, MD  pravastatin (PRAVACHOL) 80 MG tablet Take 40 mg by mouth daily.   Yes Historical Provider, MD  traMADol (ULTRAM) 50 MG tablet Take 1 tablet (50 mg total) by mouth 2 (two) times daily as needed for severe pain. Patient taking differently: Take 50 mg by mouth 2 (two) times daily as needed for severe pain (migraines).  11/14/15  Yes Carmin Muskrat, MD  irbesartan (AVAPRO) 150 MG tablet Take 1 tablet (150 mg total) by mouth daily. Patient not taking: Reported on 09/06/2016 02/28/16   Reyne Dumas, MD    Physical Exam: Vitals:   09/06/16 1800 09/06/16 1815 09/06/16 1845 09/06/16 1900  BP: 117/73 125/74 128/86 122/73  Pulse: 99 97 97 99  Resp: 26 24 18 24   Temp:      TempSrc:      SpO2: (!) 89% (!) 88% (!) 86% (!) 88%      Constitutional: NAD, calm, comfortable Vitals:   09/06/16 1800 09/06/16 1815 09/06/16 1845 09/06/16 1900  BP: 117/73 125/74 128/86 122/73  Pulse: 99 97 97 99  Resp: 26 24 18 24   Temp:      TempSrc:      SpO2: (!) 89% (!) 88% (!) 86% (!) 88%   Eyes: PERRL, lids and conjunctivae normal ENMT: Mucous membranes are moist. Posterior pharynx clear of any exudate or lesions..  Neck: normal, supple, no masses, no thyromegaly Respiratory: Decreased breath sounds with wheezing bilaterally. No rhonchi or rales. No accessory muscle use.  Cardiovascular: Regular rate and rhythm, no murmurs / rubs / gallops. No extremity edema. 2+ pedal pulses. No carotid bruits.  Abdomen: no tenderness, no masses palpated. No hepatosplenomegaly. Bowel sounds positive.  Musculoskeletal: no clubbing / cyanosis. No joint deformity upper and  lower extremities. Good ROM, no contractures. Normal muscle tone.  Skin: no rashes, lesions, ulcers. No induration Neurologic: CN 2-12 grossly intact. Sensation intact, DTR normal. Strength 5/5 in all 4.  Psychiatric: Normal judgment and insight. Alert and oriented x 3. Normal mood.     Labs on Admission: I have personally reviewed following labs and imaging studies  CBC:  Recent Labs Lab 09/06/16 1437  WBC 11.3*  NEUTROABS 9.7*  HGB 12.1  HCT 43.2  MCV 89.1  PLT [id number]   Basic Metabolic Panel:  Recent Labs Lab 09/06/16 1437  NA 139  K 3.9  CL 100*  CO2 33*  GLUCOSE 237*  BUN 15  CREATININE 0.74  CALCIUM 8.6*   GFR: CrCl cannot be calculated (Unknown ideal weight.). Liver Function Tests:  Recent Labs Lab 09/06/16 1437  AST 100*  ALT 329*  ALKPHOS 77  BILITOT 0.7  PROT 6.5  ALBUMIN 3.2*   No results for input(s): LIPASE, AMYLASE  in the last 168 hours. No results for input(s): AMMONIA in the last 168 hours. Coagulation Profile: No results for input(s): INR, PROTIME in the last 168 hours. Cardiac Enzymes: No results for input(s): CKTOTAL, CKMB, CKMBINDEX, TROPONINI in the last 168 hours. BNP (last 3 results) No results for input(s): PROBNP in the last 8760 hours. HbA1C: No results for input(s): HGBA1C in the last 72 hours. CBG: No results for input(s): GLUCAP in the last 168 hours. Lipid Profile: No results for input(s): CHOL, HDL, LDLCALC, TRIG, CHOLHDL, LDLDIRECT in the last 72 hours. Thyroid Function Tests: No results for input(s): TSH, T4TOTAL, FREET4, T3FREE, THYROIDAB in the last 72 hours. Anemia Panel: No results for input(s): VITAMINB12, FOLATE, FERRITIN, TIBC, IRON, RETICCTPCT in the last 72 hours. Urine analysis:    Component Value Date/Time   COLORURINE YELLOW 02/19/2016 1815   APPEARANCEUR CLOUDY (A) 02/19/2016 1815   LABSPEC 1.013 02/19/2016 1815   PHURINE 5.0 02/19/2016 1815   GLUCOSEU NEGATIVE 02/19/2016 1815   HGBUR NEGATIVE  02/19/2016 1815   BILIRUBINUR NEGATIVE 02/19/2016 1815   KETONESUR NEGATIVE 02/19/2016 1815   PROTEINUR NEGATIVE 02/19/2016 1815   UROBILINOGEN 0.2 04/01/2013 1014   NITRITE NEGATIVE 02/19/2016 1815   LEUKOCYTESUR SMALL (A) 02/19/2016 1815   Radiological Exams on Admission: Ct Angio Chest Pe W/cm &/or Wo Cm  Result Date: 09/06/2016 CLINICAL DATA:  Shortness of breath and wheezing for 2 weeks. EXAM: CT ANGIOGRAPHY CHEST WITH CONTRAST TECHNIQUE: Multidetector CT imaging of the chest was performed using the standard protocol during bolus administration of intravenous contrast. Multiplanar CT image reconstructions and MIPs were obtained to evaluate the vascular anatomy. CONTRAST:  100 cc Omnipaque 350. COMPARISON:  CT chest 06/13/2016. Single-view of the chest this same day and PA and lateral chest 05/17/2016. FINDINGS: Cardiovascular: No pulmonary embolus is identified. The main pulmonary artery measures 3.2 cm transverse and the ascending aorta mm 2.3 cm transverse compatible pulmonary arterial hypertension. There is cardiomegaly. No pericardial effusion. Mediastinum/Nodes: No pathologically enlarged lymph nodes are identified. Lungs/Pleura: No pleural effusion. Ground glass attenuation is seen in the upper lobes with full mosaic pattern. Basilar atelectasis is noted. Upper Abdomen: Unremarkable. Musculoskeletal: No focal abnormality. Nodule in the right breast is unchanged since the most recent study. Review of the MIP images confirms the above findings. IMPRESSION: Core negative for pulmonary embolus. Ground-glass attenuation the upper lobes could be due to cryptogenic organizing pneumonia, hypersensitivity pneumonitis, edema or reactive airways disease. Cardiomegaly. Findings compatible with pulmonary arterial hypertension. Electronically Signed   By: Inge Rise M.D.   On: 09/06/2016 17:27   Dg Chest Port 1 View  Result Date: 09/06/2016 CLINICAL DATA:  Short of breath EXAM: PORTABLE CHEST 1  VIEW COMPARISON:  05/17/2016 FINDINGS: Catheter tubing overlying the right chest is unchanged. Heart size upper normal. Negative for heart failure or edema. No pleural effusion. Patchy density right mid lung has resolved and may have been atelectasis or pneumonia on the prior study. Lungs are now clear. IMPRESSION: No active disease. Electronically Signed   By: Franchot Gallo M.D.   On: 09/06/2016 13:19   Echocardiogram 02/18/2006  ------------------------------------------------------------------- LV EF: 65% -   70%  ------------------------------------------------------------------- Indications:      Dyspnea 786.09.  ------------------------------------------------------------------- History:   PMH:  Acute respiratory failure.  Risk factors: Diabetes mellitus. Dyslipidemia.  ------------------------------------------------------------------- Study Conclusions  - Left ventricle: The cavity size was normal. Systolic function was   vigorous. The estimated ejection fraction was in the range of 65%   to 70%.  Wall motion was normal; there were no regional wall   motion abnormalities. Left ventricular diastolic function   parameters were normal.  EKG: Independently reviewed. Vent. rate 93 BPM PR interval * ms QRS duration 82 ms QT/QTc 338/421 ms P-R-T axes 71 107 259 Age not entered, assumed to be 45 years old for purpose of ECG interpretation Sinus rhythm Probable anterior infarct, age indeterminate When compared to last EKG in 02/17/2016 inferior leads T waves are mildly inverted, Previously were flattened.   Assessment/Plan Principal Problem:   COPD exacerbation ([place]) Admit to Telemetry/observation. Continue supplemental oxygen. Continue duo nebs every 6 hours. Albuterol nebulizers every 4 hours as needed. Continue Solu-Medrol 80 mg IVP every 6 hours. Levaquin 750 mg IVPB every 24 hours. Consider pulmonology evaluation if no improvement.  Active Problems:   HTN  (hypertension) Continue amlodipine 5 mg by mouth daily. Continue Avapro 300 mg by mouth daily. Monitor blood pressure, renal function and electrolytes periodically.    Hyperlipidemia Hold Pravastatin due to abnormal LFTs. Acute hepatitis panel.    Chronic diastolic CHF (congestive heart failure) ([facility]) Continue avapro 300 mg by mouth daily Furosemide 40 mg IVPB daily. Monitor intake and output.    Type 2 diabetes mellitus with complication ([facility]) Carbohydrate modified diet. Continue metformin 1000 mg by mouth twice a day. CBG monitoring with regular insulin sliding scale while on steroids.    Bipolar disorder ([place]) Stable at this time. Continue Lamictal and valproic acid. I informed the patient that her valproic acid level is sub-therapeutic. Per patient, her dosage was recently decreased, but she has not experienced any noticeable changes. Follow-up with psychiatry as scheduled.    DVT prophylaxis: Lovenox SQ. Code Status: Full code. Family Communication:  Disposition Plan: Admit to stepdown for close respiratory status monitoring and treatment. Consults called:  Admission status: Observation/stepdown.   Reubin Milan MD [facility] Pager [phone].  If 7PM-7AM, please contact night-coverage [email or web address] Password [email or web address]  09/06/2016, 7:50 PM

## 2016-09-06 NOTE — ED Notes (Signed)
Pt voided in female urinal without any problems at bedside.

## 2016-09-06 NOTE — ED Provider Notes (Signed)
45 yo F with shortness of breath. This been going on for the past 3 weeks. Patient was initially treated with DuoNeb's magnesium Solu-Medrol. On arrival to the ED they were started on continuous. Patient had some improvement throughout the stay. Had an elevated d-dimer and a CT scan of the chest was negative for PE but showed COP.  On my assessment the patient is feeling much better however still requiring 2 L of oxygen. Will admit.   Deno Etienne, DO 09/06/16 2046

## 2016-09-06 NOTE — ED Triage Notes (Signed)
Per EMS: pt from PCP office with resp distress and increased SOB; pt with low O2 sats in office; pt with diminished lung sounds on right and some wheezing noted; pt given 20mg  albuterol/1mg  atrovent; pt given 125mg  solu medrol and 2g mag IV

## 2016-09-06 NOTE — ED Notes (Signed)
Resp made aware of need for ABG.  St's they will collect at this time

## 2016-09-06 NOTE — ED Notes (Signed)
Dinner tray ordered for pt;  Pt made aware

## 2016-09-07 DIAGNOSIS — J440 Chronic obstructive pulmonary disease with acute lower respiratory infection: Secondary | ICD-10-CM | POA: Diagnosis not present

## 2016-09-07 DIAGNOSIS — Z9981 Dependence on supplemental oxygen: Secondary | ICD-10-CM | POA: Diagnosis not present

## 2016-09-07 DIAGNOSIS — Z23 Encounter for immunization: Secondary | ICD-10-CM | POA: Diagnosis not present

## 2016-09-07 DIAGNOSIS — Z888 Allergy status to other drugs, medicaments and biological substances status: Secondary | ICD-10-CM | POA: Diagnosis not present

## 2016-09-07 DIAGNOSIS — E785 Hyperlipidemia, unspecified: Secondary | ICD-10-CM | POA: Diagnosis present

## 2016-09-07 DIAGNOSIS — F319 Bipolar disorder, unspecified: Secondary | ICD-10-CM | POA: Diagnosis not present

## 2016-09-07 DIAGNOSIS — E039 Hypothyroidism, unspecified: Secondary | ICD-10-CM | POA: Diagnosis present

## 2016-09-07 DIAGNOSIS — T380X5A Adverse effect of glucocorticoids and synthetic analogues, initial encounter: Secondary | ICD-10-CM | POA: Diagnosis present

## 2016-09-07 DIAGNOSIS — F3170 Bipolar disorder, currently in remission, most recent episode unspecified: Secondary | ICD-10-CM | POA: Diagnosis not present

## 2016-09-07 DIAGNOSIS — Z6838 Body mass index (BMI) 38.0-38.9, adult: Secondary | ICD-10-CM | POA: Diagnosis not present

## 2016-09-07 DIAGNOSIS — I5032 Chronic diastolic (congestive) heart failure: Secondary | ICD-10-CM | POA: Diagnosis not present

## 2016-09-07 DIAGNOSIS — R0902 Hypoxemia: Secondary | ICD-10-CM | POA: Diagnosis not present

## 2016-09-07 DIAGNOSIS — J441 Chronic obstructive pulmonary disease with (acute) exacerbation: Secondary | ICD-10-CM | POA: Diagnosis not present

## 2016-09-07 DIAGNOSIS — Z833 Family history of diabetes mellitus: Secondary | ICD-10-CM | POA: Diagnosis not present

## 2016-09-07 DIAGNOSIS — J84116 Cryptogenic organizing pneumonia: Secondary | ICD-10-CM | POA: Diagnosis not present

## 2016-09-07 DIAGNOSIS — I5033 Acute on chronic diastolic (congestive) heart failure: Secondary | ICD-10-CM | POA: Diagnosis not present

## 2016-09-07 DIAGNOSIS — E1165 Type 2 diabetes mellitus with hyperglycemia: Secondary | ICD-10-CM | POA: Diagnosis not present

## 2016-09-07 DIAGNOSIS — Z79899 Other long term (current) drug therapy: Secondary | ICD-10-CM | POA: Diagnosis not present

## 2016-09-07 DIAGNOSIS — Z9119 Patient's noncompliance with other medical treatment and regimen: Secondary | ICD-10-CM | POA: Diagnosis not present

## 2016-09-07 DIAGNOSIS — Z7984 Long term (current) use of oral hypoglycemic drugs: Secondary | ICD-10-CM | POA: Diagnosis not present

## 2016-09-07 DIAGNOSIS — Z8249 Family history of ischemic heart disease and other diseases of the circulatory system: Secondary | ICD-10-CM | POA: Diagnosis not present

## 2016-09-07 DIAGNOSIS — R0602 Shortness of breath: Secondary | ICD-10-CM | POA: Diagnosis present

## 2016-09-07 DIAGNOSIS — I10 Essential (primary) hypertension: Secondary | ICD-10-CM | POA: Diagnosis not present

## 2016-09-07 DIAGNOSIS — E118 Type 2 diabetes mellitus with unspecified complications: Secondary | ICD-10-CM

## 2016-09-07 DIAGNOSIS — I110 Hypertensive heart disease with heart failure: Secondary | ICD-10-CM | POA: Diagnosis not present

## 2016-09-07 DIAGNOSIS — G4733 Obstructive sleep apnea (adult) (pediatric): Secondary | ICD-10-CM | POA: Diagnosis not present

## 2016-09-07 LAB — CBC WITH DIFFERENTIAL/PLATELET
Basophils Absolute: 0 10*3/uL (ref 0.0–0.1)
Basophils Relative: 0 %
Eosinophils Absolute: 0 10*3/uL (ref 0.0–0.7)
Eosinophils Relative: 0 %
HEMATOCRIT: 39.2 % (ref 36.0–46.0)
HEMOGLOBIN: 10.8 g/dL — AB (ref 12.0–15.0)
LYMPHS ABS: 0.5 10*3/uL — AB (ref 0.7–4.0)
LYMPHS PCT: 4 %
MCH: 24.6 pg — AB (ref 26.0–34.0)
MCHC: 27.6 g/dL — AB (ref 30.0–36.0)
MCV: 89.3 fL (ref 78.0–100.0)
MONOS PCT: 4 %
Monocytes Absolute: 0.4 10*3/uL (ref 0.1–1.0)
NEUTROS ABS: 10 10*3/uL — AB (ref 1.7–7.7)
NEUTROS PCT: 92 %
Platelets: 423 10*3/uL — ABNORMAL HIGH (ref 150–400)
RBC: 4.39 MIL/uL (ref 3.87–5.11)
RDW: 16.8 % — ABNORMAL HIGH (ref 11.5–15.5)
WBC: 10.9 10*3/uL — ABNORMAL HIGH (ref 4.0–10.5)

## 2016-09-07 LAB — COMPREHENSIVE METABOLIC PANEL
ALK PHOS: 87 U/L (ref 38–126)
ALT: 272 U/L — AB (ref 14–54)
ANION GAP: 9 (ref 5–15)
AST: 63 U/L — ABNORMAL HIGH (ref 15–41)
Albumin: 2.9 g/dL — ABNORMAL LOW (ref 3.5–5.0)
BILIRUBIN TOTAL: 0.6 mg/dL (ref 0.3–1.2)
BUN: 17 mg/dL (ref 6–20)
CALCIUM: 8.6 mg/dL — AB (ref 8.9–10.3)
CO2: 32 mmol/L (ref 22–32)
CREATININE: 0.92 mg/dL (ref 0.44–1.00)
Chloride: 93 mmol/L — ABNORMAL LOW (ref 101–111)
GFR calc non Af Amer: >60 mL/min (ref >60)
Glucose, Bld: 355 mg/dL — ABNORMAL HIGH (ref 65–99)
Potassium: 4.5 mmol/L (ref 3.5–5.1)
Sodium: 134 mmol/L — ABNORMAL LOW (ref 135–145)
TOTAL PROTEIN: 6.4 g/dL — AB (ref 6.5–8.1)

## 2016-09-07 LAB — GLUCOSE, CAPILLARY
GLUCOSE-CAPILLARY: 269 mg/dL — AB (ref 65–99)
Glucose-Capillary: 368 mg/dL — ABNORMAL HIGH (ref 65–99)
Glucose-Capillary: 274 mg/dL — ABNORMAL HIGH (ref 65–99)
Glucose-Capillary: 226 mg/dL — ABNORMAL HIGH (ref 65–99)

## 2016-09-07 LAB — HIV ANTIBODY (ROUTINE TESTING W REFLEX): HIV SCREEN 4TH GENERATION: NONREACTIVE

## 2016-09-07 LAB — MRSA PCR SCREENING: MRSA by PCR: NEGATIVE

## 2016-09-07 MED ORDER — IPRATROPIUM-ALBUTEROL 0.5-2.5 (3) MG/3ML IN SOLN: 3.0000 mL | RESPIRATORY_TRACT | Status: DC | PRN

## 2016-09-07 MED ORDER — INFLUENZA VAC SPLIT QUAD 0.5 ML IM SUSY
0.5000 mL | PREFILLED_SYRINGE | INTRAMUSCULAR | Status: AC
  Administered 2016-09-09: 0.5 mL via INTRAMUSCULAR
  Filled 2016-09-07 (×2): qty 0.5

## 2016-09-07 MED ORDER — INSULIN ASPART 100 UNIT/ML ~~LOC~~ SOLN
0.0000 [IU] | Freq: Three times a day (TID) | SUBCUTANEOUS | Status: DC
Start: 2016-09-07 — End: 2016-09-07

## 2016-09-07 MED ORDER — INSULIN GLARGINE 100 UNIT/ML ~~LOC~~ SOLN
15.0000 [IU] | Freq: Every day | SUBCUTANEOUS | Status: DC
  Administered 2016-09-07 – 2016-09-08 (×2): 15 [IU] via SUBCUTANEOUS
  Filled 2016-09-07 (×2): qty 0.15

## 2016-09-07 MED ORDER — INSULIN ASPART 100 UNIT/ML ~~LOC~~ SOLN
0.0000 [IU] | Freq: Three times a day (TID) | SUBCUTANEOUS | Status: DC
  Administered 2016-09-07: 20 [IU] via SUBCUTANEOUS
  Administered 2016-09-07 (×2): 11 [IU] via SUBCUTANEOUS
  Administered 2016-09-08: 15 [IU] via SUBCUTANEOUS
  Administered 2016-09-08: 20 [IU] via SUBCUTANEOUS
  Administered 2016-09-09: 11 [IU] via SUBCUTANEOUS
  Administered 2016-09-09: 6 [IU] via SUBCUTANEOUS
  Administered 2016-09-09: 11 [IU] via SUBCUTANEOUS
  Administered 2016-09-10: 20 [IU] via SUBCUTANEOUS
  Administered 2016-09-10: 4 [IU] via SUBCUTANEOUS
  Administered 2016-09-10: 20 [IU] via SUBCUTANEOUS
  Administered 2016-09-11: 4 [IU] via SUBCUTANEOUS
  Administered 2016-09-11: 15 [IU] via SUBCUTANEOUS

## 2016-09-07 MED ORDER — FUROSEMIDE 10 MG/ML IJ SOLN
40.0000 mg | Freq: Every day | INTRAMUSCULAR | Status: DC
  Administered 2016-09-07 – 2016-09-09 (×3): 40 mg via INTRAVENOUS
  Filled 2016-09-07 (×3): qty 4

## 2016-09-07 NOTE — Evaluation (Signed)
Physical Therapy Evaluation Patient Details Name: [name] MRN: [id number] DOB: 1971/04/04 Today's Date: 09/07/2016   History of Present Illness  45 y.o. female admitted to [facility] on 09/06/16 for low O2 sats coming in from MD office as a direct admitt.  Pt dx with COPD exacerbation and placed on O2.  Pt with significant PMHx of Pneumothorax, HTN, DM, CHF, bipolar disorder, anemia microcytic, and Ventriculoperitoneal shunt.  Clinical Impression  Pt did de-sat on RA and at time with 3 L O2 [place] during gait.  HR ranged from 110-160 during gait.  Pt with good speed and balance, but had to take quite a few standing rest breaks to both decrease her HR and increase her O2 sats.  See details below.   PT to follow acutely for deficits listed below.       Follow Up Recommendations No PT follow up    Equipment Recommendations  Other (comment) (home O2)    Recommendations for Other Services   NA    Precautions / Restrictions Precautions Precautions: Other (comment) Precaution Comments: moitor O2 sats      Mobility  Bed Mobility               General bed mobility comments: NT patient is OOB in chair  Transfers Overall transfer level: Modified independent Equipment used: None                Ambulation/Gait Ambulation/Gait assistance: Independent Ambulation Distance (Feet): 1800 Feet Assistive device: None Gait Pattern/deviations: WFL(Within Functional Limits)   Gait velocity interpretation: at or above normal speed for age/gender General Gait Details: Pt needed several standing rest breaks to stop, rest, help decrease HR and increase O2 during gait (desating even on 4 L O2 [place]).  HR ranged from 110-160 during gait, O2 sats on her first lap were 94% on 3 L O2 [place], so we decided to test her second lap on RA (as her baseline is not to wear O2 at home), she desated as low as 74% on RA, so we returned 3 L O2 [place] to nose for the remainder of gait.  Pt, even on 3 L O2 [place] needed cues to  stop, rest, and pursed lip breathe every 150' to get O2 sats to remain above 90%.           Balance Overall balance assessment: No apparent balance deficits (not formally assessed)                                           Pertinent Vitals/Pain Pain Assessment: No/denies pain    Home Living Family/patient expects to be discharged to:: Private residence Living Arrangements: Spouse/significant other;Children (23 y.o. and 72 y.o. 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 Extremity Assessment: Defer to OT evaluation           Lower Extremity Assessment: Overall WFL for tasks assessed         Communication   Communication: No difficulties  Cognition Arousal/Alertness: Awake/alert Behavior During Therapy: [facility] for  tasks assessed/performed Overall Cognitive Status: Within Functional Limits for tasks assessed                             Assessment/Plan    PT Assessment Patient needs continued PT services  PT Problem List Decreased activity tolerance;Cardiopulmonary status limiting activity          PT Treatment Interventions Gait training;Functional mobility training;Therapeutic activities;Therapeutic exercise;Patient/family education    PT Goals (Current goals can be found in the Care Plan section)  Acute Rehab PT Goals Patient Stated Goal: to return to work on Saturday PT Goal Formulation: With patient Time For Goal Achievement: 09/21/16 Potential to Achieve Goals: Good    Frequency Min 3X/week        End of Session Equipment Utilized During Treatment: Oxygen Activity Tolerance: Treatment  limited secondary to medical complications (Comment) (limited by high HR and decreased O2 sats with mobility) Patient left: in chair;with call bell/phone within reach Nurse Communication: Mobility status;Other (comment) (increased HR and decreased O2 sats)    Functional Assessment Tool Used: assist level Functional Limitation: Mobility: Walking and moving around Mobility: Walking and Moving Around Current Status [id number]): At least 1 percent but less than 20 percent impaired, limited or restricted Mobility: Walking and Moving Around Goal Status [phone]): 0 percent impaired, limited or restricted    Time: [id number] PT Time Calculation (min) (ACUTE ONLY): 25 min   Charges:   PT Evaluation $PT Eval Moderate Complexity: 1 Procedure PT Treatments $Gait Training: 8-22 mins   PT G Codes:   PT G-Codes **NOT FOR INPATIENT CLASS** Functional Assessment Tool Used: assist level Functional Limitation: Mobility: Walking and moving around Mobility: Walking and Moving Around Current Status [id number]): At least 1 percent but less than 20 percent impaired, limited or restricted Mobility: Walking and Moving Around Goal Status [phone]): 0 percent impaired, limited or restricted    Carrieanne Kleen B. Moro, [place], DPT [phone]   09/07/2016, 3:29 PM

## 2016-09-07 NOTE — Care Management Note (Signed)
Case Management Note  Patient Details  Name: [name] MRN: [id number] Date of Birth: [birth year]  Subjective/Objective:   Patient lives with spouse and two children, pta indep, has medication coverage and transport at discharge.  She has pcp.  She states she has oxygen equipment at home thru [facility], and she also has a cpap machine. She has not been using either one of these.  She has bipap order prn, has been on [place].  NCM will cont to follow for dc needs.                 Action/Plan:   Expected Discharge Date:  09/09/16               Expected Discharge Plan:  Home/Self Care  In-House Referral:     Discharge planning Services  CM Consult  Post Acute Care Choice:    Choice offered to:     DME Arranged:    DME Agency:     HH Arranged:    HH Agency:     Status of Service:  In process, will continue to follow  If discussed at Long Length of Stay Meetings, dates discussed:    Additional Comments:  Zenon Mayo, RN 09/07/2016, 5:40 PM

## 2016-09-07 NOTE — Progress Notes (Signed)
PROGRESS NOTE    [name]  [id number] DOB: 10/20/1971 DOA: 09/06/2016 PCP: Horatio Pel, MD   Brief Narrative:  [name] is a 45 y.o. female with medical history significant of microcytic anemia, bipolar disorder, diastolic CHF, type 2 diabetes, hyperlipidemia, hypertension, obstructive sleep apnea, history of pneumonia, history of pneumothorax, difficult intubation, history of rhabdomyolysis, vitamin D deficiency who is coming to the emergency department via EMS due to progressively worse shortness of breath for the past 3 weeks.   Per patient, she has been feeling progressive fatigue and dyspnea for the past 3 weeks. She complains of orthopnea and PND, but denies fever, chills, chest pain, palpitations, dizziness, diaphoresis, pitting edema of the lower extremities. She went today to see her PCP and was referred to the emergency department via EMS due to hypoxia of 78% on room air while resting and 67% while walking. She was given albuterol nebulizers, Solu-Medrol and magnesium sulfate on the way to the hospital.  ED Course: The patient was administered supplemental oxygen, nebulized bronchodilators, IV furosemide, IV methylprednisolone, azithromycin reporting partial relief of symptoms. WBC were 11.3, platelets 425, d-dimer 2.53 and transaminitis. CT chest shows ground glass attenuation of upper lobes.     Assessment & Plan:   Principal Problem:   COPD exacerbation ([place]) Active Problems:   HTN (hypertension)   Hyperlipidemia   Chronic diastolic CHF (congestive heart failure) (HCC)   Type 2 diabetes mellitus with complication (HCC)   Essential hypertension   Bipolar disorder ([place])   #1 acute COPD exacerbation/groundglass opacities upper lobes per CT/? COP Patient with some clinical improvement. Chest x-ray which was done was negative for any acute infiltrates. CT of the chest which was done was negative for PE, however did have groundglass attenuation in  the upper lobes could be due to cryptogenic organizing pneumonia, hypersensitivity pneumonitis, edema or reactive airways disease. Continue scheduled DuoNeb's, IV Levaquin, IV steroid taper, IV Lasix. Follow. If no significant improvement in symptoms in the next 24-48 hours will likely need a pulmonary consultation. Patient will need to follow-up with pulmonary in the outpatient setting.  #2 hypertension Stable. Continue current regimen of Norvasc, Lasix, Avapro.  #3 hypothyroidism Continue home dose Synthroid.  #4 diabetes mellitus type 2 Hemoglobin A1c was 6.1 on 02/22/2016. Patient currently on steroids with elevated CBGs ranging between 269- 409. Place on Lantus 15 units daily. Sliding scale insulin. Discontinue metformin.  #5 bipolar disorder Stable. No suicidal or homicidal ideations. Continue home regimen of Lamictal, Depakote.  #6 chronic diastolic CHF Seems compensated. On IV diuretics.   DVT prophylaxis: Lovenox Code Status: Full Family Communication: Updated patient. No family at bedside. Disposition Plan: Remain in the step down unit today.   Consultants:   None  Procedures:   CT angiogram chest 09/06/2016  Chest x-ray 09/06/2016  Antimicrobials:   IV Levaquin 09/07/2016   Subjective: Patient sitting up in chair. Patient states some improvement with shortness of breath than on admission. No chest pain.  Objective: Vitals:   09/06/16 2310 09/07/16 0354 09/07/16 0816 09/07/16 0933  BP: 134/76 126/74 126/74   Pulse: 99 95 87   Resp:  19 (!) 22   Temp: 98.6 F (37 C) 98.1 F (36.7 C) 98.2 F (36.8 C)   TempSrc: Oral Oral Oral   SpO2: 94% 90% 91% 93%  Weight: 79.1 kg (174 lb 6.1 oz)     Height: 4\' 9"  (1.448 m)       Intake/Output Summary (Last 24 hours) at 09/07/16 1034 Last  data filed at 09/06/16 1746  Gross per 24 hour  Intake                0 ml  Output             1100 ml  Net            -1100 ml   Filed Weights   09/06/16 2310  Weight:  79.1 kg (174 lb 6.1 oz)    Examination:  General exam: Appears calm and comfortable  Respiratory system: Clear to auscultation. Poor-fair ai rmovement. No wheezing. Speaking with some choppy sentences. Cardiovascular system: S1 & S2 heard, RRR. No JVD, murmurs, rubs, gallops or clicks. No pedal edema. Gastrointestinal system: Abdomen is nondistended, soft and nontender. No organomegaly or masses felt. Normal bowel sounds heard. Central nervous system: Alert and oriented. No focal neurological deficits. Extremities: Symmetric 5 x 5 power. Skin: No rashes, lesions or ulcers Psychiatry: Judgement and insight appear normal. Mood & affect appropriate.     Data Reviewed: I have personally reviewed following labs and imaging studies  CBC:  Recent Labs Lab 09/06/16 1437 09/07/16 0411  WBC 11.3* 10.9*  NEUTROABS 9.7* 10.0*  HGB 12.1 10.8*  HCT 43.2 39.2  MCV 89.1 89.3  PLT 425* [id number]*   Basic Metabolic Panel:  Recent Labs Lab 09/06/16 1437 09/07/16 0411  NA 139 134*  K 3.9 4.5  CL 100* 93*  CO2 33* 32  GLUCOSE 237* 355*  BUN 15 17  CREATININE 0.74 0.92  CALCIUM 8.6* 8.6*   GFR: Estimated Creatinine Clearance: 66.1 mL/min (by C-G formula based on SCr of 0.92 mg/dL). Liver Function Tests:  Recent Labs Lab 09/06/16 1437 09/07/16 0411  AST 100* 63*  ALT 329* 272*  ALKPHOS 77 87  BILITOT 0.7 0.6  PROT 6.5 6.4*  ALBUMIN 3.2* 2.9*   No results for input(s): LIPASE, AMYLASE in the last 168 hours. No results for input(s): AMMONIA in the last 168 hours. Coagulation Profile: No results for input(s): INR, PROTIME in the last 168 hours. Cardiac Enzymes: No results for input(s): CKTOTAL, CKMB, CKMBINDEX, TROPONINI in the last 168 hours. BNP (last 3 results) No results for input(s): PROBNP in the last 8760 hours. HbA1C: No results for input(s): HGBA1C in the last 72 hours. CBG:  Recent Labs Lab 09/06/16 2339 09/07/16 0815  GLUCAP 409* 269*   Lipid Profile: No  results for input(s): CHOL, HDL, LDLCALC, TRIG, CHOLHDL, LDLDIRECT in the last 72 hours. Thyroid Function Tests: No results for input(s): TSH, T4TOTAL, FREET4, T3FREE, THYROIDAB in the last 72 hours. Anemia Panel: No results for input(s): VITAMINB12, FOLATE, FERRITIN, TIBC, IRON, RETICCTPCT in the last 72 hours. Sepsis Labs: No results for input(s): PROCALCITON, LATICACIDVEN in the last 168 hours.  Recent Results (from the past 240 hour(s))  MRSA PCR Screening     Status: None   Collection Time: 09/07/16  6:11 AM  Result Value Ref Range Status   MRSA by PCR NEGATIVE NEGATIVE Final    Comment:        The GeneXpert MRSA Assay (FDA approved for NASAL specimens only), is one component of a comprehensive MRSA colonization surveillance program. It is not intended to diagnose MRSA infection nor to guide or monitor treatment for MRSA infections.          Radiology Studies: Ct Angio Chest Pe W/cm &/or Wo Cm  Result Date: 09/06/2016 CLINICAL DATA:  Shortness of breath and wheezing for 2 weeks. EXAM: CT ANGIOGRAPHY CHEST WITH CONTRAST  TECHNIQUE: Multidetector CT imaging of the chest was performed using the standard protocol during bolus administration of intravenous contrast. Multiplanar CT image reconstructions and MIPs were obtained to evaluate the vascular anatomy. CONTRAST:  100 cc Omnipaque 350. COMPARISON:  CT chest 06/13/2016. Single-view of the chest this same day and PA and lateral chest 05/17/2016. FINDINGS: Cardiovascular: No pulmonary embolus is identified. The main pulmonary artery measures 3.2 cm transverse and the ascending aorta mm 2.3 cm transverse compatible pulmonary arterial hypertension. There is cardiomegaly. No pericardial effusion. Mediastinum/Nodes: No pathologically enlarged lymph nodes are identified. Lungs/Pleura: No pleural effusion. Ground glass attenuation is seen in the upper lobes with full mosaic pattern. Basilar atelectasis is noted. Upper Abdomen:  Unremarkable. Musculoskeletal: No focal abnormality. Nodule in the right breast is unchanged since the most recent study. Review of the MIP images confirms the above findings. IMPRESSION: Core negative for pulmonary embolus. Ground-glass attenuation the upper lobes could be due to cryptogenic organizing pneumonia, hypersensitivity pneumonitis, edema or reactive airways disease. Cardiomegaly. Findings compatible with pulmonary arterial hypertension. Electronically Signed   By: Inge Rise M.D.   On: 09/06/2016 17:27   Dg Chest Port 1 View  Result Date: 09/06/2016 CLINICAL DATA:  Short of breath EXAM: PORTABLE CHEST 1 VIEW COMPARISON:  05/17/2016 FINDINGS: Catheter tubing overlying the right chest is unchanged. Heart size upper normal. Negative for heart failure or edema. No pleural effusion. Patchy density right mid lung has resolved and may have been atelectasis or pneumonia on the prior study. Lungs are now clear. IMPRESSION: No active disease. Electronically Signed   By: Franchot Gallo M.D.   On: 09/06/2016 13:19        Scheduled Meds: . amLODipine  5 mg Oral Daily  . divalproex  500 mg Oral Daily  . enoxaparin (LOVENOX) injection  40 mg Subcutaneous Daily  . ferrous sulfate  325 mg Oral Q breakfast  . furosemide  40 mg Intravenous Daily  . [START ON 09/08/2016] Influenza vac split quadrivalent PF  0.5 mL Intramuscular Tomorrow-1000  . insulin aspart  0-20 Units Subcutaneous TID WC  . insulin glargine  15 Units Subcutaneous Daily  . ipratropium-albuterol  3 mL Nebulization Q6H  . irbesartan  300 mg Oral Daily  . lamoTRIgine  100 mg Oral Daily  . levofloxacin (LEVAQUIN) IV  750 mg Intravenous QHS  . levothyroxine  50 mcg Oral QAC breakfast  . methylPREDNISolone (SOLU-MEDROL) injection  80 mg Intravenous Q6H  . multivitamin with minerals  1 tablet Oral Daily  . sodium chloride flush  3 mL Intravenous Q12H   Continuous Infusions:    LOS: 0 days    Time spent: 63  mins    Stacy Hepburn, MD [facility] Pager [phone]  If 7PM-7AM, please contact night-coverage [email or web address] Password [facility] 09/07/2016, 10:34 AM

## 2016-09-07 NOTE — Progress Notes (Signed)
09/07/2016 SATURATION QUALIFICATIONS: (This note is used to comply with regulatory documentation for home oxygen)  Patient Saturations on Room Air at Rest = 94%  Patient Saturations on Room Air while Ambulating = 74%  Patient Saturations on 3 Liters of oxygen while Ambulating = 90%  Please briefly explain why patient needs home oxygen: Pt de-sats on RA during mobility.  Barbarann Ehlers [place], [place], DPT [phone]

## 2016-09-08 DIAGNOSIS — R0602 Shortness of breath: Secondary | ICD-10-CM

## 2016-09-08 LAB — GLUCOSE, CAPILLARY
Glucose-Capillary: 350 mg/dL — ABNORMAL HIGH (ref 65–99)
Glucose-Capillary: 398 mg/dL — ABNORMAL HIGH (ref 65–99)
Glucose-Capillary: 411 mg/dL — ABNORMAL HIGH (ref 65–99)
Glucose-Capillary: 278 mg/dL — ABNORMAL HIGH (ref 65–99)

## 2016-09-08 LAB — HEPATITIS PANEL, ACUTE
HCV Ab: <0.1 s/co ratio (ref 0.0–0.9)
HEP B S AG: NEGATIVE
Hep A IgM: NEGATIVE
Hep B C IgM: NEGATIVE

## 2016-09-08 LAB — CBC
HEMATOCRIT: 41.9 % (ref 36.0–46.0)
HEMOGLOBIN: 12.1 g/dL (ref 12.0–15.0)
MCH: 25.4 pg — AB (ref 26.0–34.0)
MCHC: 28.9 g/dL — AB (ref 30.0–36.0)
MCV: 88 fL (ref 78.0–100.0)
Platelets: 454 10*3/uL — ABNORMAL HIGH (ref 150–400)
RBC: 4.76 MIL/uL (ref 3.87–5.11)
RDW: 16.5 % — AB (ref 11.5–15.5)
WBC: 21.2 10*3/uL — ABNORMAL HIGH (ref 4.0–10.5)

## 2016-09-08 LAB — BASIC METABOLIC PANEL
Anion gap: 8 (ref 5–15)
BUN: 24 mg/dL — AB (ref 6–20)
CALCIUM: 9.6 mg/dL (ref 8.9–10.3)
CHLORIDE: 91 mmol/L — AB (ref 101–111)
CO2: 34 mmol/L — AB (ref 22–32)
CREATININE: 0.85 mg/dL (ref 0.44–1.00)
GFR calc Af Amer: >60 mL/min (ref >60)
GFR calc non Af Amer: >60 mL/min (ref >60)
GLUCOSE: 325 mg/dL — AB (ref 65–99)
Potassium: 5.1 mmol/L (ref 3.5–5.1)
Sodium: 133 mmol/L — ABNORMAL LOW (ref 135–145)

## 2016-09-08 LAB — HEMOGLOBIN A1C
Hgb A1c MFr Bld: 6.4 % — ABNORMAL HIGH (ref 4.8–5.6)
Mean Plasma Glucose: 137 mg/dL

## 2016-09-08 MED ORDER — METHYLPREDNISOLONE SODIUM SUCC 125 MG IJ SOLR
80.0000 mg | Freq: Three times a day (TID) | INTRAMUSCULAR | Status: DC
  Administered 2016-09-08 – 2016-09-09 (×3): 80 mg via INTRAVENOUS
  Filled 2016-09-08 (×3): qty 2

## 2016-09-08 MED ORDER — INSULIN GLARGINE 100 UNIT/ML ~~LOC~~ SOLN
20.0000 [IU] | Freq: Every day | SUBCUTANEOUS | Status: DC
  Filled 2016-09-08: qty 0.2

## 2016-09-08 MED ORDER — INSULIN ASPART 100 UNIT/ML ~~LOC~~ SOLN
6.0000 [IU] | Freq: Three times a day (TID) | SUBCUTANEOUS | Status: DC
  Administered 2016-09-08 – 2016-09-10 (×7): 6 [IU] via SUBCUTANEOUS

## 2016-09-08 MED ORDER — INSULIN GLARGINE 100 UNIT/ML ~~LOC~~ SOLN
5.0000 [IU] | Freq: Once | SUBCUTANEOUS | Status: AC
  Administered 2016-09-08: 5 [IU] via SUBCUTANEOUS
  Filled 2016-09-08: qty 0.05

## 2016-09-08 MED ORDER — INSULIN ASPART 100 UNIT/ML ~~LOC~~ SOLN
25.0000 [IU] | Freq: Once | SUBCUTANEOUS | Status: AC
  Administered 2016-09-08: 25 [IU] via SUBCUTANEOUS

## 2016-09-08 MED ORDER — FUROSEMIDE 10 MG/ML IJ SOLN
40.0000 mg | Freq: Once | INTRAMUSCULAR | Status: AC
  Administered 2016-09-08: 40 mg via INTRAVENOUS
  Filled 2016-09-08: qty 4

## 2016-09-08 MED ORDER — ORAL CARE MOUTH RINSE
15.0000 mL | Freq: Two times a day (BID) | OROMUCOSAL | Status: DC
  Administered 2016-09-08 – 2016-09-11 (×5): 15 mL via OROMUCOSAL

## 2016-09-08 NOTE — Progress Notes (Signed)
PROGRESS NOTE    [name]  [id number] DOB: March 27, 1971 DOA: 09/06/2016 PCP: Horatio Pel, MD   Brief Narrative:  [name] is a 45 y.o. female with medical history significant of microcytic anemia, bipolar disorder, diastolic CHF, type 2 diabetes, hyperlipidemia, hypertension, obstructive sleep apnea, history of pneumonia, history of pneumothorax, difficult intubation, history of rhabdomyolysis, vitamin D deficiency who is coming to the emergency department via EMS due to progressively worse shortness of breath for the past 3 weeks.   Per patient, she has been feeling progressive fatigue and dyspnea for the past 3 weeks. She complains of orthopnea and PND, but denies fever, chills, chest pain, palpitations, dizziness, diaphoresis, pitting edema of the lower extremities. She went today to see her PCP and was referred to the emergency department via EMS due to hypoxia of 78% on room air while resting and 67% while walking. She was given albuterol nebulizers, Solu-Medrol and magnesium sulfate on the way to the hospital.  ED Course: The patient was administered supplemental oxygen, nebulized bronchodilators, IV furosemide, IV methylprednisolone, azithromycin reporting partial relief of symptoms. WBC were 11.3, platelets 425, d-dimer 2.53 and transaminitis. CT chest shows ground glass attenuation of upper lobes.     Assessment & Plan:   Principal Problem:   COPD exacerbation ([place]) Active Problems:   HTN (hypertension)   Hyperlipidemia   Chronic diastolic CHF (congestive heart failure) (HCC)   Type 2 diabetes mellitus with complication (HCC)   Essential hypertension   Bipolar disorder ([place])   #1 acute COPD exacerbation/groundglass opacities upper lobes per CT/? COP Patient with some clinical improvement. Chest x-ray which was done was negative for any acute infiltrates. CT of the chest which was done was negative for PE, however did have groundglass attenuation in  the upper lobes could be due to cryptogenic organizing pneumonia, hypersensitivity pneumonitis, edema or reactive airways disease. Continue scheduled DuoNeb's, IV Levaquin, IV steroid taper, IV Lasix. Follow. If no significant improvement in symptoms in the next 24-48 hours will likely need a pulmonary consultation. Patient will need to follow-up with pulmonary in the outpatient setting.  #2 hypertension Stable. Continue current regimen of Norvasc, Lasix, Avapro.  #3 hypothyroidism Continue home dose Synthroid.  #4 diabetes mellitus type 2 Hemoglobin A1c was 6.1 on 02/22/2016. Patient currently on steroids with elevated CBGs ranging between 226- 350. Increase Lantus 20 units daily. Sliding scale insulin. Discontinued metformin.  #5 bipolar disorder Stable. No suicidal or homicidal ideations. Continue home regimen of Lamictal, Depakote.  #6 chronic diastolic CHF Seems compensated on examination. CT chest seems to have some fluid noted. On IV diuretics.   DVT prophylaxis: Lovenox Code Status: Full Family Communication: Updated patient. No family at bedside. Disposition Plan: Remain in the step down unit today.   Consultants:   None  Procedures:   CT angiogram chest 09/06/2016  Chest x-ray 09/06/2016  Antimicrobials:   IV Levaquin 09/07/2016   Subjective: Patient laying in bed. Patient states some improvement with shortness of breath than on admission. No chest pain. Patient noted to be hypoxic with ambulation on room air with PT yesterday.  Objective: Vitals:   09/07/16 2333 09/08/16 0122 09/08/16 0403 09/08/16 0840  BP: 136/80  124/88 134/89  Pulse: 98  98 96  Resp: 17  20 (!) 23  Temp: 97.9 F (36.6 C)  98.4 F (36.9 C) 98.3 F (36.8 C)  TempSrc: Oral  Oral Oral  SpO2: 100% 98% 96% 95%  Weight:      Height:  Intake/Output Summary (Last 24 hours) at 09/08/16 1004 Last data filed at 09/07/16 2334  Gross per 24 hour  Intake              120 ml  Output                 0 ml  Net              120 ml   Filed Weights   09/06/16 2310  Weight: 79.1 kg (174 lb 6.1 oz)    Examination:  General exam: Appears calm and comfortable  Respiratory system: Clear to auscultation. Poor-fair ai rmovement. No wheezing. Speaking in full sentences. Cardiovascular system: S1 & S2 heard, RRR. No JVD, murmurs, rubs, gallops or clicks. No pedal edema. Gastrointestinal system: Abdomen is nondistended, soft and nontender. No organomegaly or masses felt. Normal bowel sounds heard. Central nervous system: Alert and oriented. No focal neurological deficits. Extremities: Symmetric 5 x 5 power. Skin: No rashes, lesions or ulcers Psychiatry: Judgement and insight appear normal. Mood & affect appropriate.     Data Reviewed: I have personally reviewed following labs and imaging studies  CBC:  Recent Labs Lab 09/06/16 1437 09/07/16 0411 09/08/16 0459  WBC 11.3* 10.9* 21.2*  NEUTROABS 9.7* 10.0*  --   HGB 12.1 10.8* 12.1  HCT 43.2 39.2 41.9  MCV 89.1 89.3 88.0  PLT 425* 423* [id number]*   Basic Metabolic Panel:  Recent Labs Lab 09/06/16 1437 09/07/16 0411 09/08/16 0459  NA 139 134* 133*  K 3.9 4.5 5.1  CL 100* 93* 91*  CO2 33* 32 34*  GLUCOSE 237* 355* 325*  BUN 15 17 24*  CREATININE 0.74 0.92 0.85  CALCIUM 8.6* 8.6* 9.6   GFR: Estimated Creatinine Clearance: 71.5 mL/min (by C-G formula based on SCr of 0.85 mg/dL). Liver Function Tests:  Recent Labs Lab 09/06/16 1437 09/07/16 0411  AST 100* 63*  ALT 329* 272*  ALKPHOS 77 87  BILITOT 0.7 0.6  PROT 6.5 6.4*  ALBUMIN 3.2* 2.9*   No results for input(s): LIPASE, AMYLASE in the last 168 hours. No results for input(s): AMMONIA in the last 168 hours. Coagulation Profile: No results for input(s): INR, PROTIME in the last 168 hours. Cardiac Enzymes: No results for input(s): CKTOTAL, CKMB, CKMBINDEX, TROPONINI in the last 168 hours. BNP (last 3 results) No results for input(s): PROBNP in the last  8760 hours. HbA1C:  Recent Labs  09/07/16 0411  HGBA1C 6.4*   CBG:  Recent Labs Lab 09/07/16 0815 09/07/16 1251 09/07/16 1631 09/07/16 2103 09/08/16 0843  GLUCAP 269* 368* 274* 226* 350*   Lipid Profile: No results for input(s): CHOL, HDL, LDLCALC, TRIG, CHOLHDL, LDLDIRECT in the last 72 hours. Thyroid Function Tests: No results for input(s): TSH, T4TOTAL, FREET4, T3FREE, THYROIDAB in the last 72 hours. Anemia Panel: No results for input(s): VITAMINB12, FOLATE, FERRITIN, TIBC, IRON, RETICCTPCT in the last 72 hours. Sepsis Labs: No results for input(s): PROCALCITON, LATICACIDVEN in the last 168 hours.  Recent Results (from the past 240 hour(s))  MRSA PCR Screening     Status: None   Collection Time: 09/07/16  6:11 AM  Result Value Ref Range Status   MRSA by PCR NEGATIVE NEGATIVE Final    Comment:        The GeneXpert MRSA Assay (FDA approved for NASAL specimens only), is one component of a comprehensive MRSA colonization surveillance program. It is not intended to diagnose MRSA infection nor to guide or monitor treatment for MRSA  infections.          Radiology Studies: Ct Angio Chest Pe W/cm &/or Wo Cm  Result Date: 09/06/2016 CLINICAL DATA:  Shortness of breath and wheezing for 2 weeks. EXAM: CT ANGIOGRAPHY CHEST WITH CONTRAST TECHNIQUE: Multidetector CT imaging of the chest was performed using the standard protocol during bolus administration of intravenous contrast. Multiplanar CT image reconstructions and MIPs were obtained to evaluate the vascular anatomy. CONTRAST:  100 cc Omnipaque 350. COMPARISON:  CT chest 06/13/2016. Single-view of the chest this same day and PA and lateral chest 05/17/2016. FINDINGS: Cardiovascular: No pulmonary embolus is identified. The main pulmonary artery measures 3.2 cm transverse and the ascending aorta mm 2.3 cm transverse compatible pulmonary arterial hypertension. There is cardiomegaly. No pericardial effusion.  Mediastinum/Nodes: No pathologically enlarged lymph nodes are identified. Lungs/Pleura: No pleural effusion. Ground glass attenuation is seen in the upper lobes with full mosaic pattern. Basilar atelectasis is noted. Upper Abdomen: Unremarkable. Musculoskeletal: No focal abnormality. Nodule in the right breast is unchanged since the most recent study. Review of the MIP images confirms the above findings. IMPRESSION: Core negative for pulmonary embolus. Ground-glass attenuation the upper lobes could be due to cryptogenic organizing pneumonia, hypersensitivity pneumonitis, edema or reactive airways disease. Cardiomegaly. Findings compatible with pulmonary arterial hypertension. Electronically Signed   By: Inge Rise M.D.   On: 09/06/2016 17:27   Dg Chest Port 1 View  Result Date: 09/06/2016 CLINICAL DATA:  Short of breath EXAM: PORTABLE CHEST 1 VIEW COMPARISON:  05/17/2016 FINDINGS: Catheter tubing overlying the right chest is unchanged. Heart size upper normal. Negative for heart failure or edema. No pleural effusion. Patchy density right mid lung has resolved and may have been atelectasis or pneumonia on the prior study. Lungs are now clear. IMPRESSION: No active disease. Electronically Signed   By: Franchot Gallo M.D.   On: 09/06/2016 13:19        Scheduled Meds: . amLODipine  5 mg Oral Daily  . divalproex  500 mg Oral Daily  . enoxaparin (LOVENOX) injection  40 mg Subcutaneous Daily  . ferrous sulfate  325 mg Oral Q breakfast  . furosemide  40 mg Intravenous Daily  . Influenza vac split quadrivalent PF  0.5 mL Intramuscular Tomorrow-1000  . insulin aspart  0-20 Units Subcutaneous TID WC  . [START ON 09/09/2016] insulin glargine  20 Units Subcutaneous Daily  . insulin glargine  5 Units Subcutaneous Once  . ipratropium-albuterol  3 mL Nebulization Q6H  . irbesartan  300 mg Oral Daily  . lamoTRIgine  100 mg Oral Daily  . levofloxacin (LEVAQUIN) IV  750 mg Intravenous QHS  . levothyroxine   50 mcg Oral QAC breakfast  . methylPREDNISolone (SOLU-MEDROL) injection  80 mg Intravenous Q8H  . multivitamin with minerals  1 tablet Oral Daily  . sodium chloride flush  3 mL Intravenous Q12H   Continuous Infusions:    LOS: 1 day    Time spent: 5 mins    Stacy Soucek, MD [facility] Pager [phone]  If 7PM-7AM, please contact night-coverage [email or web address] Password [facility] 09/08/2016, 10:04 AM

## 2016-09-08 NOTE — Evaluation (Signed)
Occupational Therapy Evaluation Patient Details Name: [name] MRN: [id number] DOB: 1971-01-08 Today's Date: 09/08/2016    History of Present Illness 45 y.o. female admitted to [facility] on 09/06/16 for low O2 sats coming in from MD office as a direct admitt.  Pt dx with COPD exacerbation and placed on O2.  Pt with significant PMHx of Pneumothorax, HTN, DM, CHF, bipolar disorder, anemia microcytic, and Ventriculoperitoneal shunt.   Clinical Impression   Pt. Is at baseline for ADLs and is Mod I with tasks. Pt. Is currently on O2 at hospital and sats remained in mid to high 90s during ADLs. Pt. Has used O2 at home in past but was not currently using supplemental oxygen. Pt. States she feels that she would be able to take care of herself at home. Pt. Does not need further skilled OT at this time.     Follow Up Recommendations  No OT follow up    Equipment Recommendations  None recommended by OT    Recommendations for Other Services       Precautions / Restrictions Precautions Precaution Comments: moitor O2 sats      Mobility Bed Mobility Overal bed mobility: Modified Independent                Transfers Overall transfer level: Modified independent Equipment used: None                  Balance Overall balance assessment: No apparent balance deficits (not formally assessed)                                          ADL Overall ADL's : Modified independent                                       General ADL Comments: Pt. is at baseline for ADLs  with O2 sats remaining in mid to high 90s on O2 4 L.      Vision Vision Assessment?: No apparent visual deficits   Perception     Praxis      Pertinent Vitals/Pain Pain Assessment: No/denies pain     Hand Dominance Right   Extremity/Trunk Assessment Upper Extremity Assessment Upper Extremity Assessment: Overall WFL for tasks assessed           Communication  Communication Communication: No difficulties   Cognition Arousal/Alertness: Awake/alert Behavior During Therapy: WFL for tasks assessed/performed Overall Cognitive Status: Within Functional Limits for tasks assessed                     General Comments       Exercises       Shoulder Instructions      Home Living Family/patient expects to be discharged to:: Private residence Living Arrangements: Spouse/significant other;Children (2 y.o. and 17 y.o. 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        OT Problem List:     OT Treatment/Interventions:      OT Goals(Current goals can be found in the care plan section) Acute Rehab OT Goals Patient Stated Goal: To go home  OT Frequency:     Barriers to D/C:            Co-evaluation              End of Session Equipment Utilized During Treatment: Gait belt  Activity Tolerance: Patient tolerated treatment well Patient left: in bed;with call bell/phone within reach   Time: [id number] OT Time Calculation (min): 42 min Charges:  OT General Charges $OT Visit: 1 Procedure OT Evaluation $OT Eval Low Complexity: 1 Procedure OT Treatments $Self Care/Home Management : 23-37 mins G-Codes:    Deondrea Aguado 10/06/16, 9:28 AM

## 2016-09-08 NOTE — Progress Notes (Signed)
Physical Therapy Treatment Patient Details Name: [name] MRN: [id number] DOB: 26-Nov-1971 Today's Date: 09-17-16    History of Present Illness 45 y.o. female admitted to [name] on 09/06/16 for low O2 sats coming in from MD office as a direct admitt.  Pt dx with COPD exacerbation and placed on O2.  Pt with significant PMHx of Pneumothorax, HTN, DM, CHF, bipolar disorder, anemia microcytic, and Ventriculoperitoneal shunt.    PT Comments    Patient ambulating on 2 liters [name] with saturations dropping to 86% with ambulation, improved with rest. Progressing towards O2 goals and mobilizing well. Will continue to see as indicated.  Follow Up Recommendations  No PT follow up     Equipment Recommendations  Other (comment) (home O2)    Recommendations for Other Services       Precautions / Restrictions Precautions Precaution Comments: moitor O2 sats    Mobility  Bed Mobility Overal bed mobility: Modified Independent                Transfers Overall transfer level: Modified independent Equipment used: None                Ambulation/Gait Ambulation/Gait assistance: Independent Ambulation Distance (Feet): 1200 Feet Assistive device: None Gait Pattern/deviations: WFL(Within Functional Limits)         Stairs            Wheelchair Mobility    Modified Rankin (Stroke Patients Only)       Balance Overall balance assessment: No apparent balance deficits (not formally assessed)                                  Cognition Arousal/Alertness: Awake/alert Behavior During Therapy: WFL for tasks assessed/performed Overall Cognitive Status: Within Functional Limits for tasks assessed                      Exercises      General Comments        Pertinent Vitals/Pain Pain Assessment: No/denies pain    Home Living                      Prior Function            PT Goals (current goals can now be found in the care plan  section) Acute Rehab PT Goals Patient Stated Goal: To go home PT Goal Formulation: With patient Time For Goal Achievement: 09/21/16 Potential to Achieve Goals: Good Progress towards PT goals: Progressing toward goals    Frequency    Min 3X/week      PT Plan Current plan remains appropriate    Co-evaluation             End of Session Equipment Utilized During Treatment: Oxygen Activity Tolerance: Treatment limited secondary to medical complications (Comment) (limited by high HR and decreased O2 sats with mobility) Patient left: in chair;with call bell/phone within reach     Time: [id number] PT Time Calculation (min) (ACUTE ONLY): 17 min  Charges:  $Gait Training: 8-22 mins                    G CodesDuncan Miller 09-17-16, 3:52 PM [name], [name]  [phone]

## 2016-09-08 NOTE — Progress Notes (Signed)
Inpatient Diabetes Program Recommendations  AACE/ADA: New Consensus Statement on Inpatient Glycemic Control (2015)  Target Ranges:  Prepandial:   less than 140 mg/dL      Peak postprandial:   less than 180 mg/dL (1-2 hours)      Critically ill patients:  140 - 180 mg/dL   Lab Results  Component Value Date   GLUCAP 350 (H) 09/08/2016   HGBA1C 6.4 (H) 09/07/2016    Review of Glycemic Control  Diabetes history: DM 2 Outpatient Diabetes medications: Metformin 1,000 mg BID Current orders for Inpatient glycemic control: Lantus 20 units (increased this am), Novolog Resistant TID  Inpatient Diabetes Program Recommendations:   Patient receiving IV Solumedrol 80 mg Q8 hrs, Lantus increased by 5 units this am. Patient receiving high doses of Novolog at meal times. Due to steroids, please also consider Novolog 6 units TID meal coverage if patient consumes at least 50% of meals.  Thanks,  Tama Headings RN, MSN, [facility] Inpatient Diabetes Coordinator Team Pager [phone] (8a-5p)

## 2016-09-09 LAB — CBC
HEMATOCRIT: 40.6 % (ref 36.0–46.0)
HEMOGLOBIN: 11.6 g/dL — AB (ref 12.0–15.0)
MCH: 25.1 pg — AB (ref 26.0–34.0)
MCHC: 28.6 g/dL — AB (ref 30.0–36.0)
MCV: 87.7 fL (ref 78.0–100.0)
Platelets: 483 10*3/uL — ABNORMAL HIGH (ref 150–400)
RBC: 4.63 MIL/uL (ref 3.87–5.11)
RDW: 16.6 % — AB (ref 11.5–15.5)
WBC: 20.9 10*3/uL — ABNORMAL HIGH (ref 4.0–10.5)

## 2016-09-09 LAB — GLUCOSE, CAPILLARY
GLUCOSE-CAPILLARY: 258 mg/dL — AB (ref 65–99)
GLUCOSE-CAPILLARY: 292 mg/dL — AB (ref 65–99)
Glucose-Capillary: 395 mg/dL — ABNORMAL HIGH (ref 65–99)
Glucose-Capillary: 124 mg/dL — ABNORMAL HIGH (ref 65–99)

## 2016-09-09 LAB — BASIC METABOLIC PANEL
Anion gap: 9 (ref 5–15)
BUN: 29 mg/dL — AB (ref 6–20)
CHLORIDE: 90 mmol/L — AB (ref 101–111)
CO2: 35 mmol/L — AB (ref 22–32)
CREATININE: 0.81 mg/dL (ref 0.44–1.00)
Calcium: 9.7 mg/dL (ref 8.9–10.3)
GFR calc non Af Amer: >60 mL/min (ref >60)
GLUCOSE: 237 mg/dL — AB (ref 65–99)
Potassium: 4.6 mmol/L (ref 3.5–5.1)
Sodium: 134 mmol/L — ABNORMAL LOW (ref 135–145)

## 2016-09-09 MED ORDER — IPRATROPIUM-ALBUTEROL 0.5-2.5 (3) MG/3ML IN SOLN
3.0000 mL | Freq: Three times a day (TID) | RESPIRATORY_TRACT | Status: DC
  Administered 2016-09-09: 3 mL via RESPIRATORY_TRACT
  Filled 2016-09-09: qty 3

## 2016-09-09 MED ORDER — METHYLPREDNISOLONE SODIUM SUCC 125 MG IJ SOLR
60.0000 mg | Freq: Two times a day (BID) | INTRAMUSCULAR | Status: DC
  Administered 2016-09-09: 60 mg via INTRAVENOUS
  Administered 2016-09-10: 0.96 mg via INTRAVENOUS
  Filled 2016-09-09 (×2): qty 2

## 2016-09-09 MED ORDER — IPRATROPIUM-ALBUTEROL 0.5-2.5 (3) MG/3ML IN SOLN: 3.0000 mL | Freq: Four times a day (QID) | RESPIRATORY_TRACT | Status: DC | PRN

## 2016-09-09 MED ORDER — INSULIN GLARGINE 100 UNIT/ML ~~LOC~~ SOLN
30.0000 [IU] | Freq: Every day | SUBCUTANEOUS | Status: DC
  Administered 2016-09-09 – 2016-09-10 (×2): 30 [IU] via SUBCUTANEOUS
  Filled 2016-09-09 (×3): qty 0.3

## 2016-09-09 MED ORDER — METHYLPREDNISOLONE SODIUM SUCC 125 MG IJ SOLR: 80.0000 mg | Freq: Two times a day (BID) | INTRAMUSCULAR | Status: DC

## 2016-09-09 MED ORDER — LEVOFLOXACIN 750 MG PO TABS
750.0000 mg | ORAL_TABLET | Freq: Every day | ORAL | Status: DC
  Administered 2016-09-09 – 2016-09-10 (×2): 750 mg via ORAL
  Filled 2016-09-09 (×2): qty 1

## 2016-09-09 NOTE — Progress Notes (Signed)
PROGRESS NOTE    [name]  [id number] DOB: July 30, 1971 DOA: 09/06/2016 PCP: Horatio Pel, MD   Brief Narrative:  [name] is a 45 y.o. female with medical history significant of microcytic anemia, bipolar disorder, diastolic CHF, type 2 diabetes, hyperlipidemia, hypertension, obstructive sleep apnea, history of pneumonia, history of pneumothorax, difficult intubation, history of rhabdomyolysis, vitamin D deficiency who is coming to the emergency department via EMS due to progressively worse shortness of breath for the past 3 weeks.   Per patient, she has been feeling progressive fatigue and dyspnea for the past 3 weeks. She complains of orthopnea and PND, but denies fever, chills, chest pain, palpitations, dizziness, diaphoresis, pitting edema of the lower extremities. She went today to see her PCP and was referred to the emergency department via EMS due to hypoxia of 78% on room air while resting and 67% while walking. She was given albuterol nebulizers, Solu-Medrol and magnesium sulfate on the way to the hospital.  ED Course: The patient was administered supplemental oxygen, nebulized bronchodilators, IV furosemide, IV methylprednisolone, azithromycin reporting partial relief of symptoms. WBC were 11.3, platelets 425, d-dimer 2.53 and transaminitis. CT chest shows ground glass attenuation of upper lobes.     Assessment & Plan:   Principal Problem:   COPD exacerbation ([place]) Active Problems:   HTN (hypertension)   Hyperlipidemia   Chronic diastolic CHF (congestive heart failure) (HCC)   Type 2 diabetes mellitus with complication (HCC)   Essential hypertension   Bipolar disorder (HCC)   SOB (shortness of breath) on exertion   #1 acute COPD exacerbation/groundglass opacities upper lobes per CT/? COP Patient with some clinical improvement. Chest x-ray which was done was negative for any acute infiltrates. CT of the chest which was done was negative for PE,  however did have groundglass attenuation in the upper lobes could be due to cryptogenic organizing pneumonia, hypersensitivity pneumonitis, edema or reactive airways disease. Continue scheduled DuoNeb's, Levaquin, IV steroid taper, IV Lasix. Follow. Patient may likely require home O2, on discharge. Patient will need to follow-up with pulmonary in the outpatient setting.  #2 hypertension Stable. Continue current regimen of Norvasc, Lasix, Avapro.  #3 hypothyroidism Continue home dose Synthroid.  #4 diabetes mellitus type 2 Hemoglobin A1c was 6.1 on 02/22/2016. Patient currently on steroids with elevated CBGs ranging between 258- 395. Increase Lantus 30 units daily. Sliding scale insulin. Discontinued metformin. Continue meal coverage insulin.  #5 bipolar disorder Stable. No suicidal or homicidal ideations. Continue home regimen of Lamictal, Depakote.  #6 chronic diastolic CHF Seems compensated on examination. CT chest seems to have some fluid noted. On IV diuretics.   DVT prophylaxis: Lovenox Code Status: Full Family Communication: Updated patient. No family at bedside. Disposition Plan: Home when medically stable.    Consultants:   None  Procedures:   CT angiogram chest 09/06/2016  Chest x-ray 09/06/2016  Antimicrobials:   IV Levaquin 09/07/2016>>> oral Levaquin   Subjective: Patient laying in bed. Patient states improvement with shortness of breath. No chest pain. Patient noted to be hypoxic with ambulation on room air.  Objective: Vitals:   09/09/16 0410 09/09/16 0730 09/09/16 0920 09/09/16 0927  BP: (!) 110/51 115/74    Pulse: 94 92    Resp: 19 20    Temp: 98.5 F (36.9 C) 98.4 F (36.9 C)    TempSrc: Oral Oral    SpO2: 90% 98% (!) 77% 92%  Weight:      Height:        Intake/Output Summary (Last  24 hours) at 09/09/16 1249 Last data filed at 09/09/16 1020  Gross per 24 hour  Intake             1533 ml  Output             1050 ml  Net              483 ml    Filed Weights   09/06/16 2310 09/08/16 2121  Weight: 79.1 kg (174 lb 6.1 oz) 79.3 kg (174 lb 13.2 oz)    Examination:  General exam: Appears calm and comfortable  Respiratory system: Clear to auscultation. Fair air movement. No wheezing. Speaking in full sentences. Cardiovascular system: S1 & S2 heard, RRR. No JVD, murmurs, rubs, gallops or clicks. No pedal edema. Gastrointestinal system: Abdomen is nondistended, soft and nontender.Obese No organomegaly or masses felt. Normal bowel sounds heard. Central nervous system: Alert and oriented. No focal neurological deficits. Extremities: Symmetric 5 x 5 power. Skin: No rashes, lesions or ulcers Psychiatry: Judgement and insight appear normal. Mood & affect appropriate.     Data Reviewed: I have personally reviewed following labs and imaging studies  CBC:  Recent Labs Lab 09/06/16 1437 09/07/16 0411 09/08/16 0459 09/09/16 0405  WBC 11.3* 10.9* 21.2* 20.9*  NEUTROABS 9.7* 10.0*  --   --   HGB 12.1 10.8* 12.1 11.6*  HCT 43.2 39.2 41.9 40.6  MCV 89.1 89.3 88.0 87.7  PLT 425* 423* 454* 123XX123*   Basic Metabolic Panel:  Recent Labs Lab 09/06/16 1437 09/07/16 0411 09/08/16 0459 09/09/16 0405  NA 139 134* 133* 134*  K 3.9 4.5 5.1 4.6  CL 100* 93* 91* 90*  CO2 33* 32 34* 35*  GLUCOSE 237* 355* 325* 237*  BUN 15 17 24* 29*  CREATININE 0.74 0.92 0.85 0.81  CALCIUM 8.6* 8.6* 9.6 9.7   GFR: Estimated Creatinine Clearance: 75.2 mL/min (by C-G formula based on SCr of 0.81 mg/dL). Liver Function Tests:  Recent Labs Lab 09/06/16 1437 09/07/16 0411  AST 100* 63*  ALT 329* 272*  ALKPHOS 77 87  BILITOT 0.7 0.6  PROT 6.5 6.4*  ALBUMIN 3.2* 2.9*   No results for input(s): LIPASE, AMYLASE in the last 168 hours. No results for input(s): AMMONIA in the last 168 hours. Coagulation Profile: No results for input(s): INR, PROTIME in the last 168 hours. Cardiac Enzymes: No results for input(s): CKTOTAL, CKMB, CKMBINDEX,  TROPONINI in the last 168 hours. BNP (last 3 results) No results for input(s): PROBNP in the last 8760 hours. HbA1C:  Recent Labs  09/07/16 0411  HGBA1C 6.4*   CBG:  Recent Labs Lab 09/08/16 1259 09/08/16 1557 09/08/16 2120 09/09/16 0718 09/09/16 1124  GLUCAP 398* 411* 278* 258* 395*   Lipid Profile: No results for input(s): CHOL, HDL, LDLCALC, TRIG, CHOLHDL, LDLDIRECT in the last 72 hours. Thyroid Function Tests: No results for input(s): TSH, T4TOTAL, FREET4, T3FREE, THYROIDAB in the last 72 hours. Anemia Panel: No results for input(s): VITAMINB12, FOLATE, FERRITIN, TIBC, IRON, RETICCTPCT in the last 72 hours. Sepsis Labs: No results for input(s): PROCALCITON, LATICACIDVEN in the last 168 hours.  Recent Results (from the past 240 hour(s))  MRSA PCR Screening     Status: None   Collection Time: 09/07/16  6:11 AM  Result Value Ref Range Status   MRSA by PCR NEGATIVE NEGATIVE Final    Comment:        The GeneXpert MRSA Assay (FDA approved for NASAL specimens only), is one component of a  comprehensive MRSA colonization surveillance program. It is not intended to diagnose MRSA infection nor to guide or monitor treatment for MRSA infections.          Radiology Studies: No results found.      Scheduled Meds: . amLODipine  5 mg Oral Daily  . divalproex  500 mg Oral Daily  . enoxaparin (LOVENOX) injection  40 mg Subcutaneous Daily  . ferrous sulfate  325 mg Oral Q breakfast  . furosemide  40 mg Intravenous Daily  . Influenza vac split quadrivalent PF  0.5 mL Intramuscular Tomorrow-1000  . insulin aspart  0-20 Units Subcutaneous TID WC  . insulin aspart  6 Units Subcutaneous TID WC  . insulin glargine  30 Units Subcutaneous Daily  . irbesartan  300 mg Oral Daily  . lamoTRIgine  100 mg Oral Daily  . levofloxacin  750 mg Oral Daily  . levothyroxine  50 mcg Oral QAC breakfast  . mouth rinse  15 mL Mouth Rinse BID  . methylPREDNISolone (SOLU-MEDROL)  injection  80 mg Intravenous Q12H  . multivitamin with minerals  1 tablet Oral Daily  . sodium chloride flush  3 mL Intravenous Q12H   Continuous Infusions:    LOS: 2 days    Time spent: 6 mins    Exander Shaul, MD [facility] Pager [phone]  If 7PM-7AM, please contact night-coverage [email or web address] Password [facility] 09/09/2016, 12:49 PM

## 2016-09-10 DIAGNOSIS — J84116 Cryptogenic organizing pneumonia: Secondary | ICD-10-CM

## 2016-09-10 LAB — CBC
HCT: 41.5 % (ref 36.0–46.0)
Hemoglobin: 11.7 g/dL — ABNORMAL LOW (ref 12.0–15.0)
MCH: 24.5 pg — ABNORMAL LOW (ref 26.0–34.0)
MCHC: 28.2 g/dL — ABNORMAL LOW (ref 30.0–36.0)
MCV: 87 fL (ref 78.0–100.0)
PLATELETS: 487 10*3/uL — AB (ref 150–400)
RBC: 4.77 MIL/uL (ref 3.87–5.11)
RDW: 16.8 % — AB (ref 11.5–15.5)
WBC: 17.4 10*3/uL — AB (ref 4.0–10.5)

## 2016-09-10 LAB — BASIC METABOLIC PANEL
ANION GAP: 12 (ref 5–15)
BUN: 35 mg/dL — AB (ref 6–20)
CALCIUM: 9.3 mg/dL (ref 8.9–10.3)
CO2: 34 mmol/L — ABNORMAL HIGH (ref 22–32)
Chloride: 89 mmol/L — ABNORMAL LOW (ref 101–111)
Creatinine, Ser: 0.93 mg/dL (ref 0.44–1.00)
GFR calc Af Amer: >60 mL/min (ref >60)
GLUCOSE: 300 mg/dL — AB (ref 65–99)
Potassium: 4.6 mmol/L (ref 3.5–5.1)
SODIUM: 135 mmol/L (ref 135–145)

## 2016-09-10 LAB — GLUCOSE, CAPILLARY
GLUCOSE-CAPILLARY: 190 mg/dL — AB (ref 65–99)
GLUCOSE-CAPILLARY: 416 mg/dL — AB (ref 65–99)
Glucose-Capillary: 357 mg/dL — ABNORMAL HIGH (ref 65–99)
Glucose-Capillary: 288 mg/dL — ABNORMAL HIGH (ref 65–99)

## 2016-09-10 MED ORDER — INSULIN ASPART 100 UNIT/ML ~~LOC~~ SOLN
10.0000 [IU] | Freq: Three times a day (TID) | SUBCUTANEOUS | Status: DC
  Administered 2016-09-10 – 2016-09-11 (×2): 10 [IU] via SUBCUTANEOUS

## 2016-09-10 MED ORDER — FUROSEMIDE 40 MG PO TABS
40.0000 mg | ORAL_TABLET | Freq: Every day | ORAL | Status: DC
  Administered 2016-09-10 – 2016-09-11 (×2): 40 mg via ORAL
  Filled 2016-09-10 (×2): qty 1

## 2016-09-10 MED ORDER — PREDNISONE 50 MG PO TABS
60.0000 mg | ORAL_TABLET | Freq: Every day | ORAL | Status: DC
  Administered 2016-09-11: 60 mg via ORAL
  Filled 2016-09-10: qty 1

## 2016-09-10 NOTE — Progress Notes (Signed)
PROGRESS NOTE    [name]  [id number] DOB: 1971/02/11 DOA: 09/06/2016 PCP: Horatio Pel, MD   Brief Narrative:  [name] is a 45 y.o. female with medical history significant of microcytic anemia, bipolar disorder, diastolic CHF, type 2 diabetes, hyperlipidemia, hypertension, obstructive sleep apnea, history of pneumonia, history of pneumothorax, difficult intubation, history of rhabdomyolysis, vitamin D deficiency who is coming to the emergency department via EMS due to progressively worse shortness of breath for the past 3 weeks.   Per patient, she has been feeling progressive fatigue and dyspnea for the past 3 weeks. She complains of orthopnea and PND, but denies fever, chills, chest pain, palpitations, dizziness, diaphoresis, pitting edema of the lower extremities. She went today to see her PCP and was referred to the emergency department via EMS due to hypoxia of 78% on room air while resting and 67% while walking. She was given albuterol nebulizers, Solu-Medrol and magnesium sulfate on the way to the hospital.  ED Course: The patient was administered supplemental oxygen, nebulized bronchodilators, IV furosemide, IV methylprednisolone, azithromycin reporting partial relief of symptoms. WBC were 11.3, platelets 425, d-dimer 2.53 and transaminitis. CT chest shows ground glass attenuation of upper lobes.     Assessment & Plan:   Principal Problem:   COPD exacerbation ([place]) Active Problems:   HTN (hypertension)   Hyperlipidemia   Chronic diastolic CHF (congestive heart failure) (HCC)   Type 2 diabetes mellitus with complication (HCC)   Essential hypertension   Bipolar disorder (HCC)   SOB (shortness of breath) on exertion   #1 acute COPD exacerbation/groundglass opacities upper lobes per CT/? COP Patient with some clinical improvement. Chest x-ray which was done was negative for any acute infiltrates. CT of the chest which was done was negative for PE,  however did have groundglass attenuation in the upper lobes could be due to cryptogenic organizing pneumonia, hypersensitivity pneumonitis, edema or reactive airways disease. Continue scheduled DuoNeb's, Levaquin. Change IV steroids to oral prednisone taper. Change IV Lasix to oral Lasix. Patient may likely require home O2, on discharge. Patient will need to follow-up with pulmonary in the outpatient setting.  #2 hypertension Stable. Continue current regimen of Norvasc, Lasix, Avapro.  #3 hypothyroidism Continue home dose Synthroid.  #4 diabetes mellitus type 2 Hemoglobin A1c was 6.1 on 02/22/2016. Patient currently on steroids with elevated CBGs ranging between 190- 395. Increased Lantus 30 units daily. Sliding scale insulin. Discontinued metformin. Increase meal coverage insulin to 10 units 3 times a day with meals. Continue meal coverage insulin.  #5 bipolar disorder Stable. No suicidal or homicidal ideations. Continue home regimen of Lamictal, Depakote.  #6 chronic diastolic CHF Seems compensated on examination. CT chest seems to have some fluid noted. Will transition from IV diuretics to oral diuretics.    DVT prophylaxis: Lovenox Code Status: Full Family Communication: Updated patient. No family at bedside. Disposition Plan: Home when medically stable, hopefully tomorrow.    Consultants:   None  Procedures:   CT angiogram chest 09/06/2016  Chest x-ray 09/06/2016  Antimicrobials:   IV Levaquin 09/07/2016>>> oral Levaquin   Subjective: Patient sitting up in chair in the dark. Patient denies chest pain. Shortness of breath improved. Patient noted to be hypoxic with ambulation on room air.  Objective: Vitals:   09/09/16 1832 09/09/16 2027 09/10/16 0400 09/10/16 0720  BP: 112/64 119/73 128/88 116/72  Pulse: 98 100 93 62  Resp: 20 19 19 20   Temp: 98.2 F (36.8 C) 99 F (37.2 C) 98.5 F (36.9 C)  97.9 F (36.6 C)  TempSrc: Oral Oral Oral Oral  SpO2: 92% 96% 99%  98%  Weight:  79.5 kg (175 lb 4.3 oz)    Height:        Intake/Output Summary (Last 24 hours) at 09/10/16 1250 Last data filed at 09/10/16 0900  Gross per 24 hour  Intake              483 ml  Output             2375 ml  Net            -1892 ml   Filed Weights   09/06/16 2310 09/08/16 2121 09/09/16 2027  Weight: 79.1 kg (174 lb 6.1 oz) 79.3 kg (174 lb 13.2 oz) 79.5 kg (175 lb 4.3 oz)    Examination:  General exam: Appears calm and comfortable  Respiratory system: Clear to auscultation. Fair air movement. No wheezing. Speaking in full sentences. Cardiovascular system: S1 & S2 heard, RRR. No JVD, murmurs, rubs, gallops or clicks. No pedal edema. Gastrointestinal system: Abdomen is nondistended, soft and nontender.Obese No organomegaly or masses felt. Normal bowel sounds heard. Central nervous system: Alert and oriented. No focal neurological deficits. Extremities: Symmetric 5 x 5 power. Skin: No rashes, lesions or ulcers Psychiatry: Judgement and insight appear normal. Mood & affect appropriate.     Data Reviewed: I have personally reviewed following labs and imaging studies  CBC:  Recent Labs Lab 09/06/16 1437 09/07/16 0411 09/08/16 0459 09/09/16 0405 09/10/16 0353  WBC 11.3* 10.9* 21.2* 20.9* 17.4*  NEUTROABS 9.7* 10.0*  --   --   --   HGB 12.1 10.8* 12.1 11.6* 11.7*  HCT 43.2 39.2 41.9 40.6 41.5  MCV 89.1 89.3 88.0 87.7 87.0  PLT 425* 423* 454* 483* 0000000*   Basic Metabolic Panel:  Recent Labs Lab 09/06/16 1437 09/07/16 0411 09/08/16 0459 09/09/16 0405 09/10/16 0353  NA 139 134* 133* 134* 135  K 3.9 4.5 5.1 4.6 4.6  CL 100* 93* 91* 90* 89*  CO2 33* 32 34* 35* 34*  GLUCOSE 237* 355* 325* 237* 300*  BUN 15 17 24* 29* 35*  CREATININE 0.74 0.92 0.85 0.81 0.93  CALCIUM 8.6* 8.6* 9.6 9.7 9.3   GFR: Estimated Creatinine Clearance: 65.6 mL/min (by C-G formula based on SCr of 0.93 mg/dL). Liver Function Tests:  Recent Labs Lab 09/06/16 1437 09/07/16 0411    AST 100* 63*  ALT 329* 272*  ALKPHOS 77 87  BILITOT 0.7 0.6  PROT 6.5 6.4*  ALBUMIN 3.2* 2.9*   No results for input(s): LIPASE, AMYLASE in the last 168 hours. No results for input(s): AMMONIA in the last 168 hours. Coagulation Profile: No results for input(s): INR, PROTIME in the last 168 hours. Cardiac Enzymes: No results for input(s): CKTOTAL, CKMB, CKMBINDEX, TROPONINI in the last 168 hours. BNP (last 3 results) No results for input(s): PROBNP in the last 8760 hours. HbA1C: No results for input(s): HGBA1C in the last 72 hours. CBG:  Recent Labs Lab 09/09/16 1124 09/09/16 1631 09/09/16 2023 09/10/16 0745 09/10/16 1151  GLUCAP 395* 292* 124* 190* 416*   Lipid Profile: No results for input(s): CHOL, HDL, LDLCALC, TRIG, CHOLHDL, LDLDIRECT in the last 72 hours. Thyroid Function Tests: No results for input(s): TSH, T4TOTAL, FREET4, T3FREE, THYROIDAB in the last 72 hours. Anemia Panel: No results for input(s): VITAMINB12, FOLATE, FERRITIN, TIBC, IRON, RETICCTPCT in the last 72 hours. Sepsis Labs: No results for input(s): PROCALCITON, LATICACIDVEN in the last 168 hours.  Recent Results (from the past 240 hour(s))  MRSA PCR Screening     Status: None   Collection Time: 09/07/16  6:11 AM  Result Value Ref Range Status   MRSA by PCR NEGATIVE NEGATIVE Final    Comment:        The GeneXpert MRSA Assay (FDA approved for NASAL specimens only), is one component of a comprehensive MRSA colonization surveillance program. It is not intended to diagnose MRSA infection nor to guide or monitor treatment for MRSA infections.          Radiology Studies: No results found.      Scheduled Meds: . amLODipine  5 mg Oral Daily  . divalproex  500 mg Oral Daily  . enoxaparin (LOVENOX) injection  40 mg Subcutaneous Daily  . ferrous sulfate  325 mg Oral Q breakfast  . furosemide  40 mg Oral Daily  . insulin aspart  0-20 Units Subcutaneous TID WC  . insulin aspart  6 Units  Subcutaneous TID WC  . insulin glargine  30 Units Subcutaneous Daily  . irbesartan  300 mg Oral Daily  . lamoTRIgine  100 mg Oral Daily  . levofloxacin  750 mg Oral Daily  . levothyroxine  50 mcg Oral QAC breakfast  . mouth rinse  15 mL Mouth Rinse BID  . multivitamin with minerals  1 tablet Oral Daily  . [START ON 09/11/2016] predniSONE  60 mg Oral QAC breakfast  . sodium chloride flush  3 mL Intravenous Q12H   Continuous Infusions:    LOS: 3 days    Time spent: 47 mins    Darwyn Ponzo, MD [facility] Pager [phone]  If 7PM-7AM, please contact night-coverage [email or web address] Password [facility] 09/10/2016, 12:50 PM

## 2016-09-11 LAB — CBC
HCT: 43.6 % (ref 36.0–46.0)
Hemoglobin: 12.5 g/dL (ref 12.0–15.0)
MCH: 24.9 pg — AB (ref 26.0–34.0)
MCHC: 28.7 g/dL — ABNORMAL LOW (ref 30.0–36.0)
MCV: 86.7 fL (ref 78.0–100.0)
PLATELETS: 423 10*3/uL — AB (ref 150–400)
RBC: 5.03 MIL/uL (ref 3.87–5.11)
RDW: 16.8 % — AB (ref 11.5–15.5)
WBC: 15.5 10*3/uL — ABNORMAL HIGH (ref 4.0–10.5)

## 2016-09-11 LAB — BASIC METABOLIC PANEL
Anion gap: 7 (ref 5–15)
BUN: 26 mg/dL — AB (ref 6–20)
CO2: 36 mmol/L — ABNORMAL HIGH (ref 22–32)
Calcium: 9.4 mg/dL (ref 8.9–10.3)
Chloride: 95 mmol/L — ABNORMAL LOW (ref 101–111)
Creatinine, Ser: 0.79 mg/dL (ref 0.44–1.00)
GFR calc Af Amer: >60 mL/min (ref >60)
GLUCOSE: 65 mg/dL (ref 65–99)
POTASSIUM: 5.1 mmol/L (ref 3.5–5.1)
Sodium: 138 mmol/L (ref 135–145)

## 2016-09-11 LAB — GLUCOSE, CAPILLARY
GLUCOSE-CAPILLARY: 126 mg/dL — AB (ref 65–99)
Glucose-Capillary: 62 mg/dL — ABNORMAL LOW (ref 65–99)
Glucose-Capillary: 59 mg/dL — ABNORMAL LOW (ref 65–99)
Glucose-Capillary: 177 mg/dL — ABNORMAL HIGH (ref 65–99)
Glucose-Capillary: 341 mg/dL — ABNORMAL HIGH (ref 65–99)

## 2016-09-11 MED ORDER — TIOTROPIUM BROMIDE MONOHYDRATE 18 MCG IN CAPS: 18.0000 ug | ORAL_CAPSULE | Freq: Every day | RESPIRATORY_TRACT | 4 refills | Status: DC

## 2016-09-11 MED ORDER — PREDNISONE 20 MG PO TABS: 60.0000 mg | ORAL_TABLET | Freq: Every day | ORAL | 0 refills | Status: DC

## 2016-09-11 MED ORDER — INSULIN GLARGINE 100 UNIT/ML ~~LOC~~ SOLN
20.0000 [IU] | Freq: Every day | SUBCUTANEOUS | Status: DC
  Administered 2016-09-11: 20 [IU] via SUBCUTANEOUS
  Filled 2016-09-11 (×2): qty 0.2

## 2016-09-11 MED ORDER — INSULIN ASPART 100 UNIT/ML ~~LOC~~ SOLN
4.0000 [IU] | Freq: Three times a day (TID) | SUBCUTANEOUS | Status: DC
  Administered 2016-09-11: 4 [IU] via SUBCUTANEOUS

## 2016-09-11 MED ORDER — BUDESONIDE-FORMOTEROL FUMARATE 160-4.5 MCG/ACT IN AERO: 2.0000 | INHALATION_SPRAY | Freq: Two times a day (BID) | RESPIRATORY_TRACT | 4 refills | Status: DC

## 2016-09-11 MED ORDER — FUROSEMIDE 40 MG PO TABS: 40.0000 mg | ORAL_TABLET | Freq: Every day | ORAL | 4 refills | Status: DC

## 2016-09-11 NOTE — Progress Notes (Signed)
Patient Discharge: Disposition: Patient discharged to home with husband. Education: Reviewed her medications, prescriptions, follow-up appointments, and discharge instructions, understood and acknowledged. IV: Discontinued IV before discharge. Telemetry: Discontinued before discharge, CCMD notified. Transportation: Patient escorted in w/c out of the unit. Belongings: Patient took all her belongings with her.

## 2016-09-11 NOTE — Progress Notes (Signed)
Physical Therapy Treatment Patient Details Name: [name] MRN: [id number] DOB: 03/17/1971 Today's Date: 09/11/2016    History of Present Illness 45 y.o. female admitted to [facility] on 09/06/16 for low O2 sats coming in from MD office as a direct admitt.  Pt dx with COPD exacerbation and placed on O2.  Pt with significant PMHx of Pneumothorax, HTN, DM, CHF, bipolar disorder, anemia microcytic, and Ventriculoperitoneal shunt.    PT Comments    Pt ambulated on 2 L O2 with sats at 86%. Upon return to recliner in room, O2 increased to 91% with 2 L O2 in place.  Follow Up Recommendations  No PT follow up     Equipment Recommendations  Other (comment) (home O2)    Recommendations for Other Services       Precautions / Restrictions Precautions Precautions: Other (comment) Precaution Comments: moitor O2 sats    Mobility  Bed Mobility Overal bed mobility: Modified Independent                Transfers Overall transfer level: Modified independent Equipment used: None                Ambulation/Gait Ambulation/Gait assistance: Independent Ambulation Distance (Feet): 500 Feet Assistive device: None Gait Pattern/deviations: WFL(Within Functional Limits)   Gait velocity interpretation: at or above normal speed for age/gender General Gait Details: Pt on RA upon arrival. O2 sats at 87%. Ambulated in room on RA and sats decreased to 80%. Pt placed on 2 L O2 with sats quickly increasing to 97% at rest. Pt ambulated on 2 L with sats at 86%.   Stairs            Wheelchair Mobility    Modified Rankin (Stroke Patients Only)       Balance Overall balance assessment: No apparent balance deficits (not formally assessed)                                  Cognition Arousal/Alertness: Awake/alert Behavior During Therapy: WFL for tasks assessed/performed Overall Cognitive Status: Within Functional Limits for tasks assessed                       Exercises      General Comments        Pertinent Vitals/Pain Pain Assessment: No/denies pain    Home Living                      Prior Function            PT Goals (current goals can now be found in the care plan section) Acute Rehab PT Goals Patient Stated Goal: To go home PT Goal Formulation: With patient Time For Goal Achievement: 09/21/16 Potential to Achieve Goals: Good Progress towards PT goals: Progressing toward goals    Frequency    Min 3X/week      PT Plan Current plan remains appropriate    Co-evaluation             End of Session Equipment Utilized During Treatment: Gait belt;Oxygen Activity Tolerance: Patient tolerated treatment well Patient left: in chair;with call bell/phone within reach     Time: 0900-0914 PT Time Calculation (min) (ACUTE ONLY): 14 min  Charges:  $Gait Training: 8-22 mins                    G Codes:  Lorriane Shire 09/11/2016, 9:39 AM

## 2016-09-11 NOTE — Progress Notes (Addendum)
Inpatient Diabetes Program Recommendations  AACE/ADA: New Consensus Statement on Inpatient Glycemic Control (2015)  Target Ranges:  Prepandial:   less than 140 mg/dL      Peak postprandial:   less than 180 mg/dL (1-2 hours)      Critically ill patients:  140 - 180 mg/dL   Lab Results  Component Value Date   GLUCAP 126 (H) 09/11/2016   HGBA1C 6.4 (H) 09/07/2016   Review of Glycemic Control  Diabetes history: Dm 2 Outpatient Diabetes medications: Metformin 1,000 mg BID Current orders for Inpatient glycemic control: Lantus 20 units Daily, Novolog Resistant TID, Novolog 10 units TID meal coverage  Inpatient Diabetes Program Recommendations:   IV Solumedrol transitioned to PO prednisone 60 mg Daily. Patient had hypoglycemia this am. Patient only takes Metformin at home. Please consider titrating down basal insulin and meal coverage to Lantus 10 units (Hold until glucose increases, maybe start order tonight), decrease correction to Novolog Moderate, meal coverage decrease to Novolog 3 units if needed (make sure parameters are listed for meal coverage Hold if patient consumes <50% of meals and/or if glucose reading is <80 mg/dl). Will recheck glucose reading at lunch to see if meal coverage is needed.  Thanks,  Tama Headings RN, MSN, [facility] Inpatient Diabetes Coordinator Team Pager [phone] (8a-5p)

## 2016-09-11 NOTE — Discharge Summary (Signed)
Physician Discharge Summary  [name] [id number] DOB: 09-Feb-1971 DOA: 09/06/2016  PCP: Stacy Pel, MD  Admit date: 09/06/2016 Discharge date: 09/11/2016  Time spent: 65 minutes  Recommendations for Outpatient Follow-up:  1. Follow-up with Stacy Pel, MD in 1-2 weeks. On follow-up patient will need a basic metabolic profile done to follow-up on electrolytes and renal function. 2. Follow-up with pulmonary as scheduled.   Discharge Diagnoses:  Principal Problem:   COPD exacerbation ([place]) Active Problems:   HTN (hypertension)   Hyperlipidemia   Chronic diastolic CHF (congestive heart failure) (HCC)   Type 2 diabetes mellitus with complication (HCC)   Essential hypertension   Bipolar disorder (HCC)   SOB (shortness of breath) on exertion   Cryptogenic organizing pneumonia [facility])   Discharge Condition: Stable and improved  Diet recommendation: Heart healthy  Filed Weights   09/09/16 2027 09/10/16 2033 09/11/16 0500  Weight: 79.5 kg (175 lb 4.3 oz) 80 kg (176 lb 5.9 oz) 80 kg (176 lb 5.9 oz)    History of present illness:  Per Dr [name] is a 45 y.o. female with medical history significant of microcytic anemia, bipolar disorder, diastolic CHF, type 2 diabetes, hyperlipidemia, hypertension, obstructive sleep apnea, history of pneumonia, history of pneumothorax, difficult intubation, history of rhabdomyolysis, vitamin D deficiency who presented to the emergency department via EMS due to progressively worse shortness of breath for the past 3 weeks.   Per patient, she has been feeling progressive fatigue and dyspnea for the past 3 weeks. She complained of orthopnea and PND, but denied fever, chills, chest pain, palpitations, dizziness, diaphoresis, pitting edema of the lower extremities. She went today to see her PCP and was referred to the emergency department via EMS due to hypoxia of 78% on room air while resting and 67% while walking. She  was given albuterol nebulizers, Solu-Medrol and magnesium sulfate on the way to the hospital.  ED Course: The patient was administered supplemental oxygen, nebulized bronchodilators, IV furosemide, IV methylprednisolone, azithromycin reporting partial relief of symptoms. WBC were 11.3, platelets 425, d-dimer 2.53 and transaminitis. CT chest shows ground glass attenuation of upper lobes.    Hospital Course:  #1 acute COPD exacerbation/groundglass opacities upper lobes per CT/? COP Patient was admitted with worsening shortness of breath and felt to likely have an acute COPD exacerbation in the setting of possible probable CHF exacerbation.  Chest x-ray which was done was negative for any acute infiltrates. CT of the chest which was done was negative for PE, however did have groundglass attenuation in the upper lobes could be due to cryptogenic organizing pneumonia, hypersensitivity pneumonitis, edema or reactive airways disease. Patient was placed on scheduled nebulizers, IV antibiotics, IV steroids, IV Lasix. Patient diuresed well patient improved clinically. IV steroids were tapered down to oral steroids which patient tolerated. Patient was subsequently transitioned to oral antibiotics and completed a five-day course of oral antibiotics. Patient be discharged home on a steroid taper, home oxygen, Spiriva and Symbicort as well as daily Lasix. Outpatient follow-up with PCP in pulmonary.   #2 hypertension Stable. Continued on Norvasc, Lasix, Avapro.  #3 hypothyroidism Continued on home regimen of Synthroid.  #4 diabetes mellitus type 2 Hemoglobin A1c was 6.1 on 02/22/2016. Patient was on IV steroids tapered during the hospitalization secondary to probable acute COPD exacerbation and a such had elevated CBGs. Patient was placed on Lantus and sliding scale insulin. Patient's oral hypoglycemic agents were held. Patient's blood sugars improved with steroid taper. Patient be discharged back home on  home regimen of oral hypoglycemic agents. Outpatient follow-up.   #5 bipolar disorder Stable. No suicidal or homicidal ideations. Continued on home regimen of Lamictal, Depakote.  #6 probable acute on chronic diastolic CHF Seemed compensated on examination. CT chest seemed to have some fluid noted. Patient was placed on IV Lasix with good diuresis and was -2.1 L during the hospitalization. Patient improved clinically. Patient had a 2-D echo already done in March 2017 with a EF of 65-70% with no wall motion abnormalities. Patient will be discharged home on Lasix 40 mg daily.    Procedures:  CT angiogram chest 09/06/2016  Chest x-ray 09/06/2016   Consultations:  None  Discharge Exam: Vitals:   09/11/16 0436 09/11/16 0933  BP: 130/73 124/67  Pulse: 88 97  Resp: 20 18  Temp: 98.8 F (37.1 C) 98.6 F (37 C)    General: NAD Cardiovascular: RRR Respiratory: CTAB  Discharge Instructions   Discharge Instructions    Diet - low sodium heart healthy    Complete by:  As directed    Increase activity slowly    Complete by:  As directed      Current Discharge Medication List    START taking these medications   Details  budesonide-formoterol (SYMBICORT) 160-4.5 MCG/ACT inhaler Inhale 2 puffs into the lungs 2 (two) times daily. Qty: 1 Inhaler, Refills: 4    predniSONE (DELTASONE) 20 MG tablet Take 3 tablets (60 mg total) by mouth daily before breakfast. Take 3 tablets (60mg ) daily x 2 days, then 2 tablets (40mg ) daily x 3 days, then 1 tablet (20mg ) daily x 3 days then stop. Qty: 15 tablet, Refills: 0    tiotropium (SPIRIVA HANDIHALER) 18 MCG inhalation capsule Place 1 capsule (18 mcg total) into inhaler and inhale daily. Qty: 30 capsule, Refills: 4      CONTINUE these medications which have CHANGED   Details  furosemide (LASIX) 40 MG tablet Take 1 tablet (40 mg total) by mouth daily. Qty: 30 tablet, Refills: 4      CONTINUE these medications which have NOT CHANGED    Details  amLODipine (NORVASC) 5 MG tablet Take 5 mg by mouth daily.    divalproex (DEPAKOTE ER) 500 MG 24 hr tablet Take 500 mg by mouth daily.     FeFum-FePoly-FA-B Cmp-C-Biot (INTEGRA PLUS) CAPS Take 1 capsule by mouth daily. Refills: 11    irbesartan (AVAPRO) 300 MG tablet Take 300 mg by mouth daily. Refills: 3    lamoTRIgine (LAMICTAL) 200 MG tablet Take 100 mg by mouth daily.     levothyroxine (SYNTHROID, LEVOTHROID) 50 MCG tablet Take 50 mcg by mouth daily before breakfast.    metFORMIN (GLUCOPHAGE) 1000 MG tablet Take 1,000 mg by mouth 2 (two) times daily with a meal.    Methylsulfonylmethane (MSM PO) Take 2 tablets by mouth daily.     Multiple Vitamin (MULTIVITAMIN) LIQD Take 5 mLs by mouth daily.    pravastatin (PRAVACHOL) 80 MG tablet Take 40 mg by mouth daily.    traMADol (ULTRAM) 50 MG tablet Take 1 tablet (50 mg total) by mouth 2 (two) times daily as needed for severe pain. Qty: 20 tablet, Refills: 0      STOP taking these medications     ferrous sulfate 325 (65 FE) MG tablet        Allergies  Allergen Reactions  . Abilify [Aripiprazole]     hyperglycemia   Follow-up Information    Stacy Pel, MD. Schedule an appointment as soon as possible for  a visit in 2 week(s).   Specialty:  Internal Medicine Contact information: [address] [place] [address] [phone]        pulmonary .   Why:  f/u with pulmonary as scheduled.           The results of significant diagnostics from this hospitalization (including imaging, microbiology, ancillary and laboratory) are listed below for reference.    Significant Diagnostic Studies: Ct Angio Chest Pe W/cm &/or Wo Cm  Result Date: 09/06/2016 CLINICAL DATA:  Shortness of breath and wheezing for 2 weeks. EXAM: CT ANGIOGRAPHY CHEST WITH CONTRAST TECHNIQUE: Multidetector CT imaging of the chest was performed using the standard protocol during bolus administration of  intravenous contrast. Multiplanar CT image reconstructions and MIPs were obtained to evaluate the vascular anatomy. CONTRAST:  100 cc Omnipaque 350. COMPARISON:  CT chest 06/13/2016. Single-view of the chest this same day and PA and lateral chest 05/17/2016. FINDINGS: Cardiovascular: No pulmonary embolus is identified. The main pulmonary artery measures 3.2 cm transverse and the ascending aorta mm 2.3 cm transverse compatible pulmonary arterial hypertension. There is cardiomegaly. No pericardial effusion. Mediastinum/Nodes: No pathologically enlarged lymph nodes are identified. Lungs/Pleura: No pleural effusion. Ground glass attenuation is seen in the upper lobes with full mosaic pattern. Basilar atelectasis is noted. Upper Abdomen: Unremarkable. Musculoskeletal: No focal abnormality. Nodule in the right breast is unchanged since the most recent study. Review of the MIP images confirms the above findings. IMPRESSION: Core negative for pulmonary embolus. Ground-glass attenuation the upper lobes could be due to cryptogenic organizing pneumonia, hypersensitivity pneumonitis, edema or reactive airways disease. Cardiomegaly. Findings compatible with pulmonary arterial hypertension. Electronically Signed   By: Inge Rise M.D.   On: 09/06/2016 17:27   Dg Chest Port 1 View  Result Date: 09/06/2016 CLINICAL DATA:  Short of breath EXAM: PORTABLE CHEST 1 VIEW COMPARISON:  05/17/2016 FINDINGS: Catheter tubing overlying the right chest is unchanged. Heart size upper normal. Negative for heart failure or edema. No pleural effusion. Patchy density right mid lung has resolved and may have been atelectasis or pneumonia on the prior study. Lungs are now clear. IMPRESSION: No active disease. Electronically Signed   By: Franchot Gallo M.D.   On: 09/06/2016 13:19    Microbiology: Recent Results (from the past 240 hour(s))  MRSA PCR Screening     Status: None   Collection Time: 09/07/16  6:11 AM  Result Value Ref Range  Status   MRSA by PCR NEGATIVE NEGATIVE Final    Comment:        The GeneXpert MRSA Assay (FDA approved for NASAL specimens only), is one component of a comprehensive MRSA colonization surveillance program. It is not intended to diagnose MRSA infection nor to guide or monitor treatment for MRSA infections.      Labs: Basic Metabolic Panel:  Recent Labs Lab 09/07/16 0411 09/08/16 0459 09/09/16 0405 09/10/16 0353 09/11/16 0656  NA 134* 133* 134* 135 138  K 4.5 5.1 4.6 4.6 5.1  CL 93* 91* 90* 89* 95*  CO2 32 34* 35* 34* 36*  GLUCOSE 355* 325* 237* 300* 65  BUN 17 24* 29* 35* 26*  CREATININE 0.92 0.85 0.81 0.93 0.79  CALCIUM 8.6* 9.6 9.7 9.3 9.4   Liver Function Tests:  Recent Labs Lab 09/06/16 1437 09/07/16 0411  AST 100* 63*  ALT 329* 272*  ALKPHOS 77 87  BILITOT 0.7 0.6  PROT 6.5 6.4*  ALBUMIN 3.2* 2.9*   No results for input(s): LIPASE, AMYLASE  in the last 168 hours. No results for input(s): AMMONIA in the last 168 hours. CBC:  Recent Labs Lab 09/06/16 1437 09/07/16 0411 09/08/16 0459 09/09/16 0405 09/10/16 0353 09/11/16 0656  WBC 11.3* 10.9* 21.2* 20.9* 17.4* 15.5*  NEUTROABS 9.7* 10.0*  --   --   --   --   HGB 12.1 10.8* 12.1 11.6* 11.7* 12.5  HCT 43.2 39.2 41.9 40.6 41.5 43.6  MCV 89.1 89.3 88.0 87.7 87.0 86.7  PLT 425* 423* 454* 483* 487* 423*   Cardiac Enzymes: No results for input(s): CKTOTAL, CKMB, CKMBINDEX, TROPONINI in the last 168 hours. BNP: BNP (last 3 results)  Recent Labs  02/17/16 2030 09/06/16 1437  BNP 14.3 162.6*    ProBNP (last 3 results) No results for input(s): PROBNP in the last 8760 hours.  CBG:  Recent Labs Lab 09/10/16 2031 09/11/16 0627 09/11/16 0630 09/11/16 0743 09/11/16 1205  GLUCAP 288* 62* 59* 126* 177*       Signed:  THOMPSON,DANIEL MD.  [facility] 09/11/2016, 4:29 PM

## 2016-09-11 NOTE — Progress Notes (Signed)
SATURATION QUALIFICATIONS: (This note is used to comply with regulatory documentation for home oxygen)  Patient Saturations on Room Air at Rest = 95 Patient Saturations on Room Air while Ambulating = went down to 75%  Currently on 2L O2 [place].

## 2016-09-18 ENCOUNTER — Ambulatory Visit (INDEPENDENT_AMBULATORY_CARE_PROVIDER_SITE_OTHER): Payer: Federal, State, Local not specified - PPO | Admitting: Acute Care

## 2016-09-18 ENCOUNTER — Other Ambulatory Visit: Payer: Self-pay

## 2016-09-18 ENCOUNTER — Encounter: Payer: Self-pay | Admitting: Acute Care

## 2016-09-18 VITALS — BP 166/98 | HR 68 | Ht <= 58 in | Wt 178.6 lb

## 2016-09-18 DIAGNOSIS — J9611 Chronic respiratory failure with hypoxia: Secondary | ICD-10-CM

## 2016-09-18 DIAGNOSIS — J441 Chronic obstructive pulmonary disease with (acute) exacerbation: Secondary | ICD-10-CM

## 2016-09-18 DIAGNOSIS — J449 Chronic obstructive pulmonary disease, unspecified: Secondary | ICD-10-CM

## 2016-09-18 NOTE — Assessment & Plan Note (Addendum)
Pt. Has been non-compliant with her nocturnal oxygen over last year. Has been wearing it since hospital discharge 08/2016 Plan: Continue wearing your oxygen every night without fail at 1 L. Health Risks of failure to comply with treatment reviewed. Pt. Verbalized understanding.

## 2016-09-18 NOTE — Patient Instructions (Addendum)
It is good to meet you again. We will check labs today ( Alpha ) Continue taking your Symbicort and Spiriva. Remember to rinse mouth after use. We will send in a prescription for Albuterol inhaler. Use your albuterol inhaler for rescue for shortnesss of breath or wheezing as needed up to 4 times a day. Continue your Lasix per your PCP. Remember to weigh yourself daily. Wear your oxygen every night at 1 L. Follow up with Dr. Halford Chessman in 2 months. Please contact office for sooner follow up if symptoms do not improve or worsen or seek emergency care

## 2016-09-18 NOTE — Assessment & Plan Note (Addendum)
Resolving exacerbation Plan: Alpha testing as patient has never been a smoker. Continue taking your Symbicort and Spiriva. Remember to rinse mouth after use. We will send in a prescription for Albuterol inhaler. Use your albuterol inhaler for rescue for shortnesss of breath or wheezing as needed up to 4 times a day. Continue your Lasix per your PCP. Remember to weigh yourself daily. Wear your oxygen every night at 1 L. Follow up with Dr. Halford Chessman in 2 months. Please contact office for sooner follow up if symptoms do not improve or worsen or seek emergency care

## 2016-09-18 NOTE — Progress Notes (Signed)
History of Present Illness [name] is a 45 y.o. female with COPD and here for follow up after COPD exacerbation requiring hospitalization. She is followed by Dr. Halford Chessman.  Past medical hx HTN, DM, HLD, Bipolar, Vit D deficiency, Diastolic CHF, Rt PTX March 2017  09/18/2016 Hospital Follow Up: Pt. Presents for follow up after hospitalization for COPD exacerbation in the setting of probable CHF exacerbation.. No infiltrate on CXR. PFT's in 2014 indicated combined obstructive and restrictive airway disease with air trapping and borderline BD response.Marland Kitchen She was treated with scheduled nebulizers, IV antibiotics, IV steroids, IV Lasix. Patient diuresed well patient improved clinically. IV steroids were tapered down to oral steroids which patient tolerated. Patient was subsequently transitioned to oral antibiotics and completed a five-day course of oral antibiotics. Patient was  discharged home on a steroid taper, home oxygen, Spiriva and Symbicort as well as daily Lasix. She has been compliant with her medications. She states her breathing is back to baseline. She can walk longer distances without getting short of breath. She denies fever, chest pain, cough, secretions or wheezing, orthopnea or hemoptysis.. She states she has never been a smoker. She wore  nocturnal oxygen 2 years ago. She states prior to hospitalization she was not wearing it, but has started wearing it again. She also has not been using her CPAP device, but states she recognized she needs to wear it. She does not know where it is, but feels she does still have it somewhere.She has had her flu shot.She seems very confused about her medical history  Admit date: 09/06/2016 Discharge date: 09/11/2016   Tests Pulmonary tests CT chest 03/31/13 >> dilated PA, no PE, patchy b/l infiltrates PFT 05/22/13 >> FEV1 1.05 (57%), FEV1% 80, TLC 2.87 (73%), DLCO 107%, borderline BD response CT chest 06/13/16 >> Precarinal LAN now 9 mm, scar RUL  decreased in size, Rt pleural effusion resolved, resolved ATX   Sleep tests PSG 06/25/13 >> AHI 1.7, RDI 5.6, SpO2 low 80%. Needed 1 liter oxygen ONO with RA 09/12/13 >> Test time 4 hrs 19 min. Baseline SpO2 94%, low SpO2 79%. Spent 21 min with SpO2 < 89%.  Cardiac tests 03/31/13 Echo >>EF [id number], Grade 2 diastolic dysfx  Past medical hx Past Medical History:  Diagnosis Date  . Anemia microcytic 03/31/2013  . Bipolar disorder ([place]) 03/31/2013  . CHF (congestive heart failure) ([place])   . Diabetes mellitus without complication ([place])   . Hyperlipidemia 03/31/2013  . Hypertension   . OSA (obstructive sleep apnea)   . Plantar fasciitis of left foot   . Pneumonia   . Pneumothorax   . Rhabdomyolysis 03/31/2013   [place] secondary to abilify  . Vitamin D deficiency 03/31/2013     Past surgical hx, Family hx, Social hx all reviewed.  Current Outpatient Prescriptions on File Prior to Visit  Medication Sig  . amLODipine (NORVASC) 5 MG tablet Take 5 mg by mouth daily.  . budesonide-formoterol (SYMBICORT) 160-4.5 MCG/ACT inhaler Inhale 2 puffs into the lungs 2 (two) times daily.  . divalproex (DEPAKOTE ER) 500 MG 24 hr tablet Take 500 mg by mouth daily.   Marland Kitchen FeFum-FePoly-FA-B Cmp-C-Biot (INTEGRA PLUS) CAPS Take 1 capsule by mouth daily.  . irbesartan (AVAPRO) 300 MG tablet Take 300 mg by mouth daily.  Marland Kitchen lamoTRIgine (LAMICTAL) 200 MG tablet Take 100 mg by mouth daily.   Marland Kitchen levothyroxine (SYNTHROID, LEVOTHROID) 50 MCG tablet Take 50 mcg by mouth daily before breakfast.  . metFORMIN (GLUCOPHAGE) 1000 MG tablet Take  1,000 mg by mouth 2 (two) times daily with a meal.  . Methylsulfonylmethane (MSM PO) Take 3 tablets by mouth daily.   . Multiple Vitamin (MULTIVITAMIN) LIQD Take 5 mLs by mouth daily.  . pravastatin (PRAVACHOL) 80 MG tablet Take 40 mg by mouth daily.  . predniSONE (DELTASONE) 20 MG tablet Take 3 tablets (60 mg total) by mouth daily before breakfast. Take 3 tablets (60mg ) daily x 2 days,  then 2 tablets (40mg ) daily x 3 days, then 1 tablet (20mg ) daily x 3 days then stop.  Marland Kitchen tiotropium (SPIRIVA HANDIHALER) 18 MCG inhalation capsule Place 1 capsule (18 mcg total) into inhaler and inhale daily.  . traMADol (ULTRAM) 50 MG tablet Take 1 tablet (50 mg total) by mouth 2 (two) times daily as needed for severe pain. (Patient taking differently: Take 50 mg by mouth 2 (two) times daily as needed for severe pain (migraines). )  . furosemide (LASIX) 40 MG tablet Take 1 tablet (40 mg total) by mouth daily. (Patient not taking: Reported on 09/18/2016)   No current facility-administered medications on file prior to visit.      Allergies  Allergen Reactions  . Abilify [Aripiprazole]     hyperglycemia    Review Of Systems:  Constitutional:   No  weight loss, night sweats,  Fevers, chills, + fatigue, or  lassitude.  HEENT:   No headaches,  Difficulty swallowing,  Tooth/dental problems, or  Sore throat,                No sneezing, itching, ear ache, nasal congestion, post nasal drip,   CV:  No chest pain,  Orthopnea, PND, swelling in lower extremities, anasarca, dizziness, palpitations, syncope.   GI  No heartburn, indigestion, abdominal pain, nausea, vomiting, diarrhea, change in bowel habits, loss of appetite, bloody stools.   Resp: No shortness of breath with exertion or at rest.  No excess mucus, no productive cough,  No non-productive cough,  No coughing up of blood.  No change in color of mucus.  No wheezing.  No chest wall deformity  Skin: no rash or lesions.  GU: no dysuria, change in color of urine, no urgency or frequency.  No flank pain, no hematuria   MS:  No joint pain or swelling.  No decreased range of motion.  No back pain.  Psych:  No change in mood or affect. No depression or anxiety.  No memory loss.   Vital Signs BP (!) 166/98 (BP Location: Left Arm, Cuff Size: Normal)   Pulse 68   Ht 4\' 9"  (1.448 m)   Wt 178 lb 9.6 oz (81 kg)   LMP 08/23/2016 (Exact Date)    SpO2 94%   BMI 38.65 kg/m    Physical Exam:  General- No distress,  A&Ox3, obese, flat affect. 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 Cardiac: S1, S2, regular rate and rhythm, no murmur Chest: No wheeze/ rales/ dullness; no accessory muscle use, no nasal flaring, no sternal retractions Abd.: Soft Non-tender, obese Ext: No clubbing cyanosis, trace edema Neuro:  normal strength Skin: No rashes, warm and dry Psych: normal mood and behavior   Assessment/Plan  COPD exacerbation (HCC) Resolving exacerbation Plan: Alpha testing as patient has never been a smoker. Continue taking your Symbicort and Spiriva. Remember to rinse mouth after use. We will send in a prescription for Albuterol inhaler. Use your albuterol inhaler for rescue for  shortnesss of breath or wheezing as needed up to 4 times a day. Continue your Lasix per your PCP. Remember to weigh yourself daily. Wear your oxygen every night at 1 L. Follow up with Dr. Halford Chessman in 2 months. Please contact office for sooner follow up if symptoms do not improve or worsen or seek emergency care    Hypoxemic respiratory failure, chronic ([place]) Pt. Has been non-compliant with her nocturnal oxygen over last year. Has been wearing it since hospital discharge 08/2016 Plan: Continue wearing your oxygen every night without fail at 1 L. Health Risks of failure to comply with treatment reviewed. Pt. Verbalized understanding.    Magdalen Spatz, NP 09/18/2016  10:15 AM

## 2016-09-20 ENCOUNTER — Encounter (HOSPITAL_COMMUNITY): Payer: Self-pay | Admitting: Emergency Medicine

## 2016-09-20 ENCOUNTER — Ambulatory Visit (HOSPITAL_COMMUNITY)
Admission: EM | Admit: 2016-09-20 | Discharge: 2016-09-20 | Disposition: A | Discharge status: Home / Self Care | Payer: Federal, State, Local not specified - PPO | Attending: Family Medicine | Admitting: Family Medicine

## 2016-09-20 DIAGNOSIS — R739 Hyperglycemia, unspecified: Secondary | ICD-10-CM

## 2016-09-20 LAB — POCT I-STAT, CHEM 8
BUN: 9 mg/dL (ref 6–20)
CALCIUM ION: 1.31 mmol/L (ref 1.15–1.40)
CHLORIDE: 96 mmol/L — AB (ref 101–111)
Creatinine, Ser: 0.8 mg/dL (ref 0.44–1.00)
GLUCOSE: 432 mg/dL — AB (ref 65–99)
HCT: 46 % (ref 36.0–46.0)
Hemoglobin: 15.6 g/dL — ABNORMAL HIGH (ref 12.0–15.0)
Potassium: 4.6 mmol/L (ref 3.5–5.1)
Sodium: 138 mmol/L (ref 135–145)
TCO2: 32 mmol/L (ref 0–100)

## 2016-09-20 MED ORDER — INSULIN ASPART 100 UNIT/ML ~~LOC~~ SOLN
15.0000 [IU] | Freq: Once | SUBCUTANEOUS | Status: AC
  Administered 2016-09-20: 15 [IU] via SUBCUTANEOUS

## 2016-09-20 MED ORDER — INSULIN ASPART 100 UNIT/ML ~~LOC~~ SOLN
SUBCUTANEOUS | Status: AC
  Filled 2016-09-20: qty 1

## 2016-09-20 MED ORDER — INSULIN ASPART PROT & ASPART (70-30 MIX) 100 UNIT/ML ~~LOC~~ SUSP: 15.0000 [IU] | Freq: Once | SUBCUTANEOUS | Status: DC

## 2016-09-20 NOTE — ED Triage Notes (Signed)
Pt. Stated, I just got out of the hospital Sept. 24 for COPD, CHF, and SOB,. I was taking my BP and Blood sugar and its high and Im concerned with the numbers being high.  I have an appt. With Dr. Shelia Media tomorrow, but the numbers concerned me.

## 2016-09-20 NOTE — Discharge Instructions (Signed)
Your sugar tonight was 432 and we gave you 15 units of Novolog insulin, see Dr Shelia Media on thurs for further diabetes care

## 2016-09-20 NOTE — ED Provider Notes (Signed)
[name]    CSN: [id number] Arrival date & time: 09/20/16  1832     History   Chief Complaint Chief Complaint  Patient presents with  . Hypertension  . Hyperglycemia    HPI [name] is a 45 y.o. female.   The history is provided by the patient.  Hypertension  This is a new problem. The problem has been gradually worsening. Pertinent negatives include no chest pain, no abdominal pain and no headaches.  Hyperglycemia  Associated symptoms: no abdominal pain and no chest pain     Past Medical History:  Diagnosis Date  . Anemia microcytic 03/31/2013  . Bipolar disorder ([place]) 03/31/2013  . CHF (congestive heart failure) ([place])   . Diabetes mellitus without complication ([place])   . Hyperlipidemia 03/31/2013  . Hypertension   . OSA (obstructive sleep apnea)   . Plantar fasciitis of left foot   . Pneumonia   . Pneumothorax   . Rhabdomyolysis 03/31/2013   [place] secondary to abilify  . Vitamin D deficiency 03/31/2013    Patient Active Problem List   Diagnosis Date Noted  . Cryptogenic organizing pneumonia ([place])   . SOB (shortness of breath) on exertion   . COPD exacerbation ([place]) 09/06/2016  . Bipolar disorder ([place]) 09/06/2016  . Abnormal CT scan, chest 04/04/2016  . Morbid obesity ([place]) 03/14/2016  . Hypoxemia   . Pneumothorax   . Urinary tract infection, site not specified   . Chronic diastolic CHF (congestive heart failure) ([place])   . Type 2 diabetes mellitus with complication ([place])   . Essential hypertension   . Affective psychosis, bipolar ([place])   . Uterine fibroids affecting pregnancy   . Acute respiratory failure ([place]) 02/17/2016  . Hypoxemic respiratory failure, chronic ([place]) 02/17/2016  . Diastolic CHF, chronic ([place]) 04/02/2013  . Obesity hypoventilation syndrome ([place]) 04/02/2013  . Abnormal EKG 03/31/2013  . HTN (hypertension) 03/31/2013  . Hyperlipidemia 03/31/2013  . Diabetes ([place]) 03/31/2013  . Vitamin D deficiency 03/31/2013  . Anemia  03/31/2013  . Bipolar I disorder, most recent episode (or current) manic ([place]) 03/17/2013    Past Surgical History:  Procedure Laterality Date  . CESAREAN SECTION     x2  . CYST EXCISION    . DILATATION & CURETTAGE/HYSTEROSCOPY WITH MYOSURE N/A 02/17/2016   Procedure: DILATATION & CURETTAGE/HYSTEROSCOPY/Myomectomy WITH MYOSURE;  Surgeon: Aloha Gell, MD;  Location: [facility];  Service: Gynecology;  Laterality: N/A;  . MYOMECTOMY N/A 02/17/2016   Procedure: Amado Coe WITH MYOSURE;  Surgeon: Aloha Gell, MD;  Location: [facility];  Service: Gynecology;  Laterality: N/A;  . VENTRICULOPERITONEAL SHUNT      OB History    No data available       Home Medications    Prior to Admission medications   Medication Sig Start Date End Date Taking? Authorizing Provider  amLODipine (NORVASC) 5 MG tablet Take 5 mg by mouth daily.    Historical Provider, MD  budesonide-formoterol (SYMBICORT) 160-4.5 MCG/ACT inhaler Inhale 2 puffs into the lungs 2 (two) times daily. 09/11/16   Eugenie Filler, MD  divalproex (DEPAKOTE ER) 500 MG 24 hr tablet Take 500 mg by mouth daily.     Historical Provider, MD  FeFum-FePoly-FA-B Cmp-C-Biot (INTEGRA PLUS) CAPS Take 1 capsule by mouth daily. 06/29/16   Historical Provider, MD  furosemide (LASIX) 40 MG tablet Take 1 tablet (40 mg total) by mouth daily. Patient not taking: Reported on 09/18/2016 09/12/16   Eugenie Filler, MD  irbesartan (AVAPRO) 300 MG tablet  Take 300 mg by mouth daily. 08/18/16   Historical Provider, MD  lamoTRIgine (LAMICTAL) 200 MG tablet Take 100 mg by mouth daily.     Historical Provider, MD  levothyroxine (SYNTHROID, LEVOTHROID) 50 MCG tablet Take 50 mcg by mouth daily before breakfast. 07/07/16   Historical Provider, MD  metFORMIN (GLUCOPHAGE) 1000 MG tablet Take 1,000 mg by mouth 2 (two) times daily with a meal.    Historical Provider, MD  Methylsulfonylmethane (MSM PO) Take 3 tablets by mouth daily.     Historical Provider, MD  Multiple Vitamin  (MULTIVITAMIN) LIQD Take 5 mLs by mouth daily.    Historical Provider, MD  pravastatin (PRAVACHOL) 80 MG tablet Take 40 mg by mouth daily.    Historical Provider, MD  predniSONE (DELTASONE) 20 MG tablet Take 3 tablets (60 mg total) by mouth daily before breakfast. Take 3 tablets (60mg ) daily x 2 days, then 2 tablets (40mg ) daily x 3 days, then 1 tablet (20mg ) daily x 3 days then stop. 09/12/16   Eugenie Filler, MD  tiotropium (SPIRIVA HANDIHALER) 18 MCG inhalation capsule Place 1 capsule (18 mcg total) into inhaler and inhale daily. 09/11/16   Eugenie Filler, MD  traMADol (ULTRAM) 50 MG tablet Take 1 tablet (50 mg total) by mouth 2 (two) times daily as needed for severe pain. Patient taking differently: Take 50 mg by mouth 2 (two) times daily as needed for severe pain (migraines).  11/14/15   Carmin Muskrat, MD    Family History Family History  Problem Relation Age of Onset  . Diabetes Mother   . Hyperlipidemia Mother   . Hypertension Mother   . Lung cancer Father   . Hypertension Brother     Social History Social History  Substance Use Topics  . Smoking status: Never Smoker  . Smokeless tobacco: Never Used  . Alcohol use No     Allergies   Abilify [aripiprazole]   Review of Systems Review of Systems  Constitutional: Negative.   Respiratory: Negative.   Cardiovascular: Negative.  Negative for chest pain and leg swelling.  Gastrointestinal: Negative.  Negative for abdominal pain.  Genitourinary: Negative.   Neurological: Negative for headaches.  All other systems reviewed and are negative.    Physical Exam Triage Vital Signs ED Triage Vitals  Enc Vitals Group     BP 09/20/16 1905 (!) 162/113     Pulse Rate 09/20/16 1905 85     Resp 09/20/16 1905 16     Temp 09/20/16 1905 98.3 F (36.8 C)     Temp Source 09/20/16 1905 Oral     SpO2 09/20/16 1905 95 %     Weight --      Height --      Head Circumference --      Peak Flow --      Pain Score 09/20/16 1917 0       Pain Loc --      Pain Edu? --      Excl. in [place]? --    No data found.   Updated Vital Signs BP (!) 162/113 Comment: notified rn  Pulse 85   Temp 98.3 F (36.8 C) (Oral)   Resp 16   LMP 08/23/2016 (Exact Date)   SpO2 95%   Visual Acuity Right Eye Distance:   Left Eye Distance:   Bilateral Distance:    Right Eye Near:   Left Eye Near:    Bilateral Near:     Physical Exam  Constitutional: She appears  well-developed and well-nourished. No distress.  Eyes: Pupils are equal, round, and reactive to light.  Neck: Normal range of motion. Neck supple.  Cardiovascular: Normal rate and regular rhythm.   Pulmonary/Chest: Effort normal and breath sounds normal.  Lymphadenopathy:    She has no cervical adenopathy.  Skin: Skin is warm and dry.  Nursing note and vitals reviewed.    UC Treatments / Results  Labs (all labs ordered are listed, but only abnormal results are displayed) Labs Reviewed  POCT I-STAT, CHEM 8 - Abnormal; Notable for the following:       Result Value   Chloride 96 (*)    Glucose, Bld 432 (*)    Hemoglobin 15.6 (*)    All other components within normal limits    EKG  EKG Interpretation None       Radiology No results found.  Procedures Procedures (including critical care time)  Medications Ordered in UC Medications - No data to display   Initial Impression / Assessment and Plan / UC Course  I have reviewed the triage vital signs and the nursing notes.  Pertinent labs & imaging results that were available during my care of the patient were reviewed by me and considered in my medical decision making (see chart for details).  Clinical Course      Final Clinical Impressions(s) / UC Diagnoses   Final diagnoses:  None    New Prescriptions New Prescriptions   No medications on file     Billy Fischer, MD 09/20/16 2021

## 2016-09-21 ENCOUNTER — Telehealth: Payer: Self-pay | Admitting: Pulmonary Disease

## 2016-09-21 MED ORDER — ALBUTEROL SULFATE HFA 108 (90 BASE) MCG/ACT IN AERS: 2.0000 | INHALATION_SPRAY | Freq: Four times a day (QID) | RESPIRATORY_TRACT | 5 refills | Status: DC | PRN

## 2016-09-21 NOTE — Telephone Encounter (Signed)
Spoke with pt. At her last OV she was to have an albuterol prescription sent in. This was not sent in. Rx has been taken care of. Nothing further was needed.

## 2016-09-21 NOTE — Telephone Encounter (Signed)
Spoke with pt. She would like the results from her Alpha 1 test. Advised her that this is still in process. We will call her as soon as the result comes back.

## 2016-09-22 LAB — ALPHA-1 ANTITRYPSIN PHENOTYPE: A1 ANTITRYPSIN: 131 mg/dL (ref 83–199)

## 2016-09-24 NOTE — Progress Notes (Signed)
Reviewed and agree with assessment/plan.  Chesley Mires, MD [facility] Pulmonary/Critical Care 09/24/2016, 10:27 AM Pager:  [phone]

## 2016-09-25 ENCOUNTER — Other Ambulatory Visit: Payer: Self-pay | Admitting: Radiology

## 2016-10-26 ENCOUNTER — Other Ambulatory Visit: Payer: Self-pay | Admitting: Obstetrics and Gynecology

## 2016-10-26 DIAGNOSIS — D251 Intramural leiomyoma of uterus: Secondary | ICD-10-CM

## 2016-11-01 ENCOUNTER — Encounter: Payer: Federal, State, Local not specified - PPO | Attending: Internal Medicine | Admitting: Skilled Nursing Facility1

## 2016-11-01 ENCOUNTER — Encounter: Payer: Self-pay | Admitting: Skilled Nursing Facility1

## 2016-11-01 DIAGNOSIS — Z713 Dietary counseling and surveillance: Secondary | ICD-10-CM | POA: Diagnosis present

## 2016-11-01 DIAGNOSIS — E119 Type 2 diabetes mellitus without complications: Secondary | ICD-10-CM | POA: Diagnosis not present

## 2016-11-01 DIAGNOSIS — I10 Essential (primary) hypertension: Secondary | ICD-10-CM | POA: Insufficient documentation

## 2016-11-01 NOTE — Progress Notes (Signed)
  Diabetes Self-Management Education  Visit Type:    Appt. Start Time: 2:30 Appt. End Time: 4:00  11/01/2016  [name], identified by name and date of birth, is a 45 y.o. female with a diagnosis of Diabetes:  .   ASSESSMENT  There were no vitals taken for this visit. There is no height or weight on file to calculate BMI.   Pt returns for a 3 month follow up. Pt states she has Had bronchiolitis and ended up in the hospital for 5 days as well as elevated sugars due to prednisone. Pt states she checks once a day. Pt states she Thinks everything has improved since leaving the hospital. Pt states her A1C is 8.6 and is absolutely sure of it. Pt states she Introduced herself to a plant based diet. Pt states she has been avoiding sugar. Pt states she wants to have a seafood broil for thanksgiving but needs to convince her husband.    Goals: -If you check your blood sugar before you have eaten anything after you have woken up you want your blood sugar to be under 130 and then if you are checking 2 hours after you have eaten you want it under 180   -moisturize your hands and feet but do not leave it between your toes   -Be sure to check your feet every day: looking for anything that was not there the day before

## 2016-11-01 NOTE — Patient Instructions (Addendum)
-  If you check your blood sugar before you have eaten anything after you have woken up you want your blood sugar to be under 130 and then if you are checking 2 hours after you have eaten you want it under 180   -moisturize your hands and feet but do not leave it between your toes   -Be sure to check your feet every day: looking for anything that was not there the day before

## 2016-11-14 ENCOUNTER — Ambulatory Visit
Admission: RE | Admit: 2016-11-14 | Discharge: 2016-11-14 | Disposition: A | Discharge status: Home / Self Care | Payer: Federal, State, Local not specified - PPO | Source: Ambulatory Visit | Attending: Obstetrics and Gynecology | Admitting: Obstetrics and Gynecology

## 2016-11-14 ENCOUNTER — Other Ambulatory Visit: Payer: Self-pay | Admitting: Obstetrics and Gynecology

## 2016-11-14 DIAGNOSIS — D251 Intramural leiomyoma of uterus: Secondary | ICD-10-CM

## 2016-11-14 HISTORY — PX: IR GENERIC HISTORICAL: IMG1180011

## 2016-11-14 NOTE — Final Consult Note (Signed)
Chief Complaint:  Symptomatic uterine fibroids, abnormal menstrual bleeding, assess for uterine fibroid embolization  Referring Physician(s): Cousins,Sheronette  History of Present Illness: [name] is a 45 y.o. female with multiple chronic medical problems including hypertension, hyperlipidemia, type 2 diabetes, obesity, chronic diastolic heart failure, and bipolar disorder.she has been followed closely by her GYN for uterine fibroids. Most recent, she had a surgical myomectomy March 0000000 which was, complicated by spontaneous right pneumothorax requiring chest tube placement and prolonged hospitalization.  Despite the surgery, she has persistent heavy menstrual periods lasting 7 days. She describes 4 heavy days with passage of blood clots. Her periods require pads and depends undergarments. Frequency of pad changes every 2 hours. No interperiod bleeding. Fibroids were originally diagnosed in 1999. Her main symptoms are abnormal bleeding. She has a mild associated anemia. She also describes urinary frequency and urgency.   She currently has an IUD in place. No recent GYN infections or abdominal discharge. Endometrial biopsy performed 02/17/2016 demonstrated no hyperplasia or malignancy. Office ultrasound confirms at least for fibroids largest measuring 5.4 cm in diameter.   Past Medical History:  Diagnosis Date  . Anemia microcytic 03/31/2013  . Bipolar disorder ([place]) 03/31/2013  . CHF (congestive heart failure) ([place])   . Diabetes mellitus without complication ([place])   . Hyperlipidemia 03/31/2013  . Hypertension   . OSA (obstructive sleep apnea)   . Plantar fasciitis of left foot   . Pneumonia   . Pneumothorax   . Rhabdomyolysis 03/31/2013   [place] secondary to abilify  . Vitamin D deficiency 03/31/2013    Past Surgical History:  Procedure Laterality Date  . CESAREAN SECTION     x2  . CYST EXCISION    . DILATATION & CURETTAGE/HYSTEROSCOPY WITH MYOSURE N/A 02/17/2016   Procedure: DILATATION & CURETTAGE/HYSTEROSCOPY/Myomectomy WITH MYOSURE;  Surgeon: Aloha Gell, MD;  Location: [facility];  Service: Gynecology;  Laterality: N/A;  . MYOMECTOMY N/A 02/17/2016   Procedure: Amado Coe WITH MYOSURE;  Surgeon: Aloha Gell, MD;  Location: [facility];  Service: Gynecology;  Laterality: N/A;  . VENTRICULOPERITONEAL SHUNT      Allergies: Abilify [aripiprazole]  Medications: Prior to Admission medications   Medication Sig Start Date End Date Taking? Authorizing Provider  albuterol (PROVENTIL HFA;VENTOLIN HFA) 108 (90 Base) MCG/ACT inhaler Inhale 2 puffs into the lungs every 6 (six) hours as needed for wheezing or shortness of breath. 09/21/16  Yes Magdalen Spatz, NP  amLODipine (NORVASC) 5 MG tablet Take 5 mg by mouth daily.   Yes Historical Provider, MD  budesonide-formoterol (SYMBICORT) 160-4.5 MCG/ACT inhaler Inhale 2 puffs into the lungs 2 (two) times daily. 09/11/16  Yes Eugenie Filler, MD  divalproex (DEPAKOTE ER) 500 MG 24 hr tablet Take 500 mg by mouth daily.    Yes Historical Provider, MD  FeFum-FePoly-FA-B Cmp-C-Biot (INTEGRA PLUS) CAPS Take 1 capsule by mouth daily. 06/29/16  Yes Historical Provider, MD  furosemide (LASIX) 40 MG tablet Take 1 tablet (40 mg total) by mouth daily. 09/12/16  Yes Eugenie Filler, MD  irbesartan (AVAPRO) 300 MG tablet Take 300 mg by mouth daily. 08/18/16  Yes Historical Provider, MD  lamoTRIgine (LAMICTAL) 200 MG tablet Take 100 mg by mouth daily.    Yes Historical Provider, MD  levothyroxine (SYNTHROID, LEVOTHROID) 50 MCG tablet Take 50 mcg by mouth daily before breakfast. 07/07/16  Yes Historical Provider, MD  metFORMIN (GLUCOPHAGE) 1000 MG tablet Take 1,000 mg by mouth 2 (two) times daily with a meal.   Yes Historical Provider,  MD  Methylsulfonylmethane (MSM PO) Take 3 tablets by mouth daily.    Yes Historical Provider, MD  Multiple Vitamin (MULTIVITAMIN) LIQD Take 5 mLs by mouth daily.   Yes Historical Provider, MD  pravastatin  (PRAVACHOL) 80 MG tablet Take 40 mg by mouth daily.   Yes Historical Provider, MD  tiotropium (SPIRIVA HANDIHALER) 18 MCG inhalation capsule Place 1 capsule (18 mcg total) into inhaler and inhale daily. 09/11/16  Yes Eugenie Filler, MD  traMADol (ULTRAM) 50 MG tablet Take 1 tablet (50 mg total) by mouth 2 (two) times daily as needed for severe pain. Patient taking differently: Take 50 mg by mouth 2 (two) times daily as needed for severe pain (migraines).  11/14/15  Yes Carmin Muskrat, MD  predniSONE (DELTASONE) 20 MG tablet Take 3 tablets (60 mg total) by mouth daily before breakfast. Take 3 tablets (60mg ) daily x 2 days, then 2 tablets (40mg ) daily x 3 days, then 1 tablet (20mg ) daily x 3 days then stop. Patient not taking: Reported on 11/14/2016 09/12/16   Eugenie Filler, MD     Family History  Problem Relation Age of Onset  . Diabetes Mother   . Hyperlipidemia Mother   . Hypertension Mother   . Lung cancer Father   . Hypertension Brother     Social History   Social History  . Marital status: Married    Spouse name: N/A  . Number of children: 2  . Years of education: N/A   Occupational History  . [employer] service [employer]   Social History Main Topics  . Smoking status: Never Smoker  . Smokeless tobacco: Never Used  . Alcohol use No  . Drug use: No  . Sexual activity: Yes    Birth control/ protection: None   Other Topics Concern  . None   Social History Narrative  . None      Review of Systems: A 12 point ROS discussed and pertinent positives are indicated in the HPI above.  All other systems are negative.  Review of Systems  Vital Signs: BP (!) 143/96 (BP Location: Left Arm, Patient Position: Sitting, Cuff Size: Normal)   Pulse 86   Temp 98.5 F (36.9 C) (Oral)   Resp 14   Ht 4\' 9"  (1.448 m)   Wt 175 lb (79.4 kg)   LMP 11/12/2016 (Approximate)   SpO2 97%   BMI 37.87 kg/m   Physical Exam  Constitutional: She is oriented to person, place, and  time. She appears well-developed and well-nourished. No distress.  Eyes: Conjunctivae are normal. No scleral icterus.  Cardiovascular: Normal rate, regular rhythm and intact distal pulses.  Exam reveals no friction rub.   No murmur heard. Pulmonary/Chest: Effort normal and breath sounds normal. No respiratory distress.  Abdominal: Soft. Bowel sounds are normal. She exhibits no distension and no mass. There is no tenderness. There is no guarding.  Obese abdomen  Musculoskeletal: She exhibits no edema or tenderness.  Neurological: She is alert and oriented to person, place, and time.  Skin: Skin is warm and dry. She is not diaphoretic. No erythema.  Psychiatric: She has a normal mood and affect. Her behavior is normal.      Imaging: No results found.  Labs:  CBC:  Recent Labs  09/08/16 0459 09/09/16 0405 09/10/16 0353 09/11/16 0656 09/20/16 1918  WBC 21.2* 20.9* 17.4* 15.5*  --   HGB 12.1 11.6* 11.7* 12.5 15.6*  HCT 41.9 40.6 41.5 43.6 46.0  PLT 454* 483* 487* 423*  --  COAGS: No results for input(s): INR, APTT in the last 8760 hours.  BMP:  Recent Labs  09/08/16 0459 09/09/16 0405 09/10/16 0353 09/11/16 0656 09/20/16 1918  NA 133* 134* 135 138 138  K 5.1 4.6 4.6 5.1 4.6  CL 91* 90* 89* 95* 96*  CO2 34* 35* 34* 36*  --   GLUCOSE 325* 237* 300* 65 432*  BUN 24* 29* 35* 26* 9  CALCIUM 9.6 9.7 9.3 9.4  --   CREATININE 0.85 0.81 0.93 0.79 0.80  GFRNONAA >60 >60 >60 >60  --   GFRAA >60 >60 >60 >60  --     LIVER FUNCTION TESTS:  Recent Labs  02/20/16 0334 09/06/16 1437 09/07/16 0411  BILITOT 0.9 0.7 0.6  AST 12* 100* 63*  ALT 12* 329* 272*  ALKPHOS 55 77 87  PROT 6.3* 6.5 6.4*  ALBUMIN 2.8* 3.2* 2.9*      Assessment and Plan:  symptomatic uterine fibroids with persistent abnormal menstrual bleeding despite myomectomy. Ultrasound Confirms At Least 4 Fibroids, Maximal Diameter 5.4 Cm.  The Uterine Fibroid Embolization Procedure Was Reviewed in  Detail Including the Procedure, Risks, Benefits and Alternatives. Expected Recovery Phase, Outcomes and Goals Were All Reviewed. All Questions Were Addressed. She Has a Clear Understanding of the Procedure and Would like to Proceed with the Workup.  Plan: Schedule for Pelvic MRI without and with Contrast to Assess Fibroid Anatomy for Embolization. If her anatomy is amenable to embolization, the IUD would have to be removed prior to the procedure.  Thank you for this interesting consult.  I greatly enjoyed meeting Tamaia Benke and look forward to participating in their care.  A copy of this report was sent to the requesting provider on this date.  Electronically Signed: Greggory Keen 11/14/2016, 3:14 PM   I spent a total of  40 Minutes   in face to face in clinical consultation, greater than 50% of which was counseling/coordinating care this patient With systematic uterine fibroids.

## 2016-11-23 ENCOUNTER — Ambulatory Visit (INDEPENDENT_AMBULATORY_CARE_PROVIDER_SITE_OTHER): Payer: Federal, State, Local not specified - PPO | Admitting: Pulmonary Disease

## 2016-11-23 ENCOUNTER — Encounter: Payer: Self-pay | Admitting: Pulmonary Disease

## 2016-11-23 ENCOUNTER — Ambulatory Visit (INDEPENDENT_AMBULATORY_CARE_PROVIDER_SITE_OTHER)
Admission: RE | Admit: 2016-11-23 | Discharge: 2016-11-23 | Disposition: A | Discharge status: Home / Self Care | Payer: Federal, State, Local not specified - PPO | Source: Ambulatory Visit | Attending: Pulmonary Disease | Admitting: Pulmonary Disease

## 2016-11-23 ENCOUNTER — Telehealth: Payer: Self-pay | Admitting: Pulmonary Disease

## 2016-11-23 VITALS — BP 134/98 | HR 96 | Ht <= 58 in | Wt 179.2 lb

## 2016-11-23 DIAGNOSIS — R918 Other nonspecific abnormal finding of lung field: Secondary | ICD-10-CM | POA: Diagnosis not present

## 2016-11-23 DIAGNOSIS — E662 Morbid (severe) obesity with alveolar hypoventilation: Secondary | ICD-10-CM

## 2016-11-23 DIAGNOSIS — J9611 Chronic respiratory failure with hypoxia: Secondary | ICD-10-CM

## 2016-11-23 NOTE — Progress Notes (Signed)
Current Outpatient Prescriptions on File Prior to Visit  Medication Sig  . albuterol (PROVENTIL HFA;VENTOLIN HFA) 108 (90 Base) MCG/ACT inhaler Inhale 2 puffs into the lungs every 6 (six) hours as needed for wheezing or shortness of breath.  Marland Kitchen amLODipine (NORVASC) 5 MG tablet Take 5 mg by mouth daily.  . divalproex (DEPAKOTE ER) 500 MG 24 hr tablet Take 500 mg by mouth daily.   Marland Kitchen FeFum-FePoly-FA-B Cmp-C-Biot (INTEGRA PLUS) CAPS Take 1 capsule by mouth daily.  . furosemide (LASIX) 40 MG tablet Take 1 tablet (40 mg total) by mouth daily.  . irbesartan (AVAPRO) 300 MG tablet Take 300 mg by mouth daily.  Marland Kitchen lamoTRIgine (LAMICTAL) 200 MG tablet Take 100 mg by mouth daily.   Marland Kitchen levothyroxine (SYNTHROID, LEVOTHROID) 50 MCG tablet Take 50 mcg by mouth daily before breakfast.  . metFORMIN (GLUCOPHAGE) 1000 MG tablet Take 1,000 mg by mouth 2 (two) times daily with a meal.  . Methylsulfonylmethane (MSM PO) Take 3 tablets by mouth daily.   . Multiple Vitamin (MULTIVITAMIN) LIQD Take 5 mLs by mouth daily.  . pravastatin (PRAVACHOL) 80 MG tablet Take 40 mg by mouth daily.  . traMADol (ULTRAM) 50 MG tablet Take 1 tablet (50 mg total) by mouth 2 (two) times daily as needed for severe pain. (Patient taking differently: Take 50 mg by mouth 2 (two) times daily as needed for severe pain (migraines). )   No current facility-administered medications on file prior to visit.      Chief Complaint  Patient presents with  . Follow-up    Pt never had an ONO - Using 1 liter O2 at night. Pt denies any current breathing issues     Pulmonary tests CT chest 03/31/13 >> dilated PA, no PE, patchy b/l infiltrates PFT 05/22/13 >> FEV1 1.05 (57%), FEV1% 80, TLC 2.87 (73%), DLCO 107%, borderline BD response CT chest 06/13/16 >> Precarinal LAN now 9 mm, scar RUL decreased in size, Rt pleural effusion resolved, resolved ATX CT angio chest 09/06/16 >> GGO in upper lobes, basilar ATX A1AT 09/18/16 >> 131, MM  Sleep tests PSG  06/25/13 >> AHI 1.7, RDI 5.6, SpO2 low 80%. Needed 1 liter oxygen ONO with RA 09/12/13 >> Test time 4 hrs 19 min. Baseline SpO2 94%, low SpO2 79%. Spent 21 min with SpO2 < 89%.  Cardiac tests Echo 02/19/16 >> EF 65 to 70%  Past medical history HTN, DM, HLD, Bipolar, Vit D deficiency, Diastolic CHF, Rt PTX March 2017  Past surgical history, Family history, Social history, Allergies reviewed  Vital Signs BP (!) 134/98 (BP Location: Left Arm, Cuff Size: Normal)   Pulse 96   Ht 4\' 9"  (1.448 m)   Wt 179 lb 3.2 oz (81.3 kg)   LMP 11/12/2016 (Approximate)   SpO2 94%   BMI 38.78 kg/m   History of Present Illness [name] is a 45 y.o. female with pulmonary infiltrates.  Her breathing has been okay.  She is not having cough, sputum, or wheeze.  She doesn't think that spiriva or symbicort help.  She is no longer taking prednisone.  She was in hospital in September for recurrent pulmonary infiltrates.  She has not had chest imaging studies since then.  She continues to use 1 liter oxygen at night.  Physical Exam  General - No distress ENT - No sinus tenderness, no oral exudate, no LAN, MP 4, enlarged tongue Cardiac - s1s2 regular, no murmur Chest - No wheeze/rales/dullness Back - No focal tenderness Abd - Soft,  non-tender Ext - No edema Neuro - Normal strength Skin - No rashes Psych - normal mood, and behavior   Assessment/Plan  Recurrent pulmonary infiltrates. - clinically improved since hospitalization in September 2017 - will repeat CXR today - don't think she has COPD >> will have her stop spiriva and symbicort  Chronic hypoxic respiratory failure 2nd to obesity hypoventilation syndrome. - continue 1 liter oxygen at night - will arrange for room air overnight oximetery - explained how low oxygen can impact her health   Patient Instructions  Chest xray today  Can try stopping spiriva and symbicort  Albuterol two puffs every 4 to 6 hours as needed for cough,  wheeze, or chest congestion  Follow up in 6 months    Chesley Mires, MD [facility] Pulmonary/Critical Care/Sleep Pager:  [phone] 11/23/2016, 9:42 AM

## 2016-11-23 NOTE — Patient Instructions (Signed)
Chest xray today  Can try stopping spiriva and symbicort  Albuterol two puffs every 4 to 6 hours as needed for cough, wheeze, or chest congestion  Follow up in 6 months

## 2016-11-23 NOTE — Telephone Encounter (Signed)
Dg Chest 2 View  Result Date: 11/23/2016 CLINICAL DATA:  Bronchitis EXAM: CHEST  2 VIEW COMPARISON:  09/06/2016 FINDINGS: Borderline cardiomegaly. No infiltrate or pleural effusion. No pulmonary edema. Right VP shunt catheter fragment with the tip midline lower anterior chest wall again noted. Mild degenerative changes mid thoracic spine. IMPRESSION: No active cardiopulmonary disease.  Borderline cardiomegaly. Electronically Signed   By: Lahoma Crocker M.D.   On: 11/23/2016 10:18      Will have my nurse inform pt that CXR was normal.

## 2016-11-24 NOTE — Telephone Encounter (Signed)
ATC x 1 memory full, unable to leave voicemail, wcb

## 2016-12-06 ENCOUNTER — Encounter: Payer: Self-pay | Admitting: Interventional Radiology

## 2016-12-08 NOTE — Telephone Encounter (Signed)
Spoke with pt and notified of results per Dr. Sood. Pt verbalized understanding and denied any questions.  

## 2016-12-21 ENCOUNTER — Ambulatory Visit: Payer: Self-pay | Admitting: Skilled Nursing Facility1

## 2017-01-05 ENCOUNTER — Inpatient Hospital Stay: Admission: RE | Admit: 2017-01-05 | Payer: Self-pay | Source: Ambulatory Visit

## 2017-01-06 ENCOUNTER — Other Ambulatory Visit: Payer: Self-pay

## 2017-01-07 ENCOUNTER — Inpatient Hospital Stay: Admission: RE | Admit: 2017-01-07 | Payer: Self-pay | Source: Ambulatory Visit

## 2017-01-11 ENCOUNTER — Ambulatory Visit
Admission: RE | Admit: 2017-01-11 | Discharge: 2017-01-11 | Disposition: A | Discharge status: Home / Self Care | Payer: Federal, State, Local not specified - PPO | Source: Ambulatory Visit | Attending: Obstetrics and Gynecology | Admitting: Obstetrics and Gynecology

## 2017-01-11 DIAGNOSIS — D251 Intramural leiomyoma of uterus: Secondary | ICD-10-CM

## 2017-01-11 MED ORDER — GADOBENATE DIMEGLUMINE 529 MG/ML IV SOLN
16.0000 mL | Freq: Once | INTRAVENOUS | Status: AC | PRN
  Administered 2017-01-11: 16 mL via INTRAVENOUS

## 2017-02-05 ENCOUNTER — Telehealth: Payer: Self-pay | Admitting: Pulmonary Disease

## 2017-02-05 DIAGNOSIS — J9611 Chronic respiratory failure with hypoxia: Secondary | ICD-10-CM

## 2017-02-05 NOTE — Telephone Encounter (Signed)
lmtcb X1 pt for

## 2017-02-06 NOTE — Telephone Encounter (Signed)
lmtcb x1 for pt. 

## 2017-02-06 NOTE — Telephone Encounter (Signed)
lmomtcb x 2  

## 2017-02-06 NOTE — Telephone Encounter (Signed)
Please arrange for overnight oximetry with room air.  Will then determine if she can go without supplemental oxygen at night.

## 2017-02-06 NOTE — Telephone Encounter (Signed)
Pt called back and she stated that she will be going out of town soon and she is requesting to see if VS feels that she needs to take her oxygen with her to use at night.  She stated that she doesn't really want to lug it around, but she will take it if VS feels that she needs to take this.  Please advise. thanks

## 2017-02-08 NOTE — Telephone Encounter (Signed)
Called and spoke with pt and she is aware of order that has been placed for the pt to have ONO on room air done.

## 2017-02-19 ENCOUNTER — Other Ambulatory Visit (HOSPITAL_COMMUNITY): Payer: Self-pay | Admitting: Interventional Radiology

## 2017-02-19 DIAGNOSIS — D259 Leiomyoma of uterus, unspecified: Secondary | ICD-10-CM

## 2017-02-26 ENCOUNTER — Other Ambulatory Visit: Payer: Self-pay | Admitting: Pulmonary Disease

## 2017-02-26 NOTE — Progress Notes (Signed)
Opened in error Virl Cagey, CMA

## 2017-03-28 ENCOUNTER — Other Ambulatory Visit: Payer: Self-pay | Admitting: General Surgery

## 2017-03-28 ENCOUNTER — Other Ambulatory Visit: Payer: Self-pay | Admitting: Radiology

## 2017-03-29 ENCOUNTER — Encounter (HOSPITAL_COMMUNITY): Payer: Self-pay

## 2017-03-29 ENCOUNTER — Ambulatory Visit (HOSPITAL_COMMUNITY)
Admission: RE | Admit: 2017-03-29 | Discharge: 2017-03-29 | Disposition: A | Discharge status: Home / Self Care | Payer: Federal, State, Local not specified - PPO | Source: Ambulatory Visit | Attending: Interventional Radiology | Admitting: Interventional Radiology

## 2017-03-29 ENCOUNTER — Observation Stay (HOSPITAL_COMMUNITY)
Admission: RE | Admit: 2017-03-29 | Discharge: 2017-03-30 | Disposition: A | Discharge status: Home / Self Care | Payer: Federal, State, Local not specified - PPO | Source: Ambulatory Visit | Attending: Interventional Radiology | Admitting: Interventional Radiology

## 2017-03-29 DIAGNOSIS — D219 Benign neoplasm of connective and other soft tissue, unspecified: Secondary | ICD-10-CM | POA: Diagnosis present

## 2017-03-29 DIAGNOSIS — I509 Heart failure, unspecified: Secondary | ICD-10-CM | POA: Insufficient documentation

## 2017-03-29 DIAGNOSIS — Z79899 Other long term (current) drug therapy: Secondary | ICD-10-CM | POA: Diagnosis not present

## 2017-03-29 DIAGNOSIS — Z6837 Body mass index (BMI) 37.0-37.9, adult: Secondary | ICD-10-CM | POA: Insufficient documentation

## 2017-03-29 DIAGNOSIS — E662 Morbid (severe) obesity with alveolar hypoventilation: Secondary | ICD-10-CM | POA: Diagnosis not present

## 2017-03-29 DIAGNOSIS — Z982 Presence of cerebrospinal fluid drainage device: Secondary | ICD-10-CM | POA: Diagnosis not present

## 2017-03-29 DIAGNOSIS — I110 Hypertensive heart disease with heart failure: Secondary | ICD-10-CM | POA: Insufficient documentation

## 2017-03-29 DIAGNOSIS — D259 Leiomyoma of uterus, unspecified: Secondary | ICD-10-CM | POA: Diagnosis present

## 2017-03-29 DIAGNOSIS — Z7984 Long term (current) use of oral hypoglycemic drugs: Secondary | ICD-10-CM | POA: Insufficient documentation

## 2017-03-29 DIAGNOSIS — E119 Type 2 diabetes mellitus without complications: Secondary | ICD-10-CM | POA: Insufficient documentation

## 2017-03-29 HISTORY — PX: IR ANGIOGRAM PELVIS SELECTIVE OR SUPRASELECTIVE: IMG661

## 2017-03-29 HISTORY — PX: IR ANGIOGRAM SELECTIVE EACH ADDITIONAL VESSEL: IMG667

## 2017-03-29 HISTORY — PX: IR US GUIDE VASC ACCESS RIGHT: IMG2390

## 2017-03-29 HISTORY — PX: IR EMBO TUMOR ORGAN ISCHEMIA INFARCT INC GUIDE ROADMAPPING: IMG5449

## 2017-03-29 LAB — BASIC METABOLIC PANEL
ANION GAP: 8 (ref 5–15)
BUN: 10 mg/dL (ref 6–20)
CALCIUM: 9 mg/dL (ref 8.9–10.3)
CO2: 28 mmol/L (ref 22–32)
CREATININE: 0.55 mg/dL (ref 0.44–1.00)
Chloride: 99 mmol/L — ABNORMAL LOW (ref 101–111)
GFR calc Af Amer: >60 mL/min (ref >60)
GLUCOSE: 277 mg/dL — AB (ref 65–99)
Potassium: 4 mmol/L (ref 3.5–5.1)
Sodium: 135 mmol/L (ref 135–145)

## 2017-03-29 LAB — CBC
HCT: 41.9 % (ref 36.0–46.0)
Hemoglobin: 13.3 g/dL (ref 12.0–15.0)
MCH: 26.8 pg (ref 26.0–34.0)
MCHC: 31.7 g/dL (ref 30.0–36.0)
MCV: 84.5 fL (ref 78.0–100.0)
PLATELETS: 376 10*3/uL (ref 150–400)
RBC: 4.96 MIL/uL (ref 3.87–5.11)
RDW: 13.3 % (ref 11.5–15.5)
WBC: 7.1 10*3/uL (ref 4.0–10.5)

## 2017-03-29 LAB — ABO/RH: ABO/RH(D): O POS

## 2017-03-29 LAB — APTT: APTT: 28 s (ref 24–36)

## 2017-03-29 LAB — TYPE AND SCREEN
ABO/RH(D): O POS
Antibody Screen: NEGATIVE

## 2017-03-29 LAB — HCG, SERUM, QUALITATIVE: PREG SERUM: NEGATIVE

## 2017-03-29 LAB — GLUCOSE, CAPILLARY
Glucose-Capillary: 272 mg/dL — ABNORMAL HIGH (ref 65–99)
Glucose-Capillary: 223 mg/dL — ABNORMAL HIGH (ref 65–99)
Glucose-Capillary: 243 mg/dL — ABNORMAL HIGH (ref 65–99)

## 2017-03-29 LAB — PROTIME-INR
INR: 0.95
Prothrombin Time: 12.7 seconds (ref 11.4–15.2)

## 2017-03-29 MED ORDER — DIPHENHYDRAMINE HCL 50 MG/ML IJ SOLN: 12.5000 mg | Freq: Four times a day (QID) | INTRAMUSCULAR | Status: DC | PRN

## 2017-03-29 MED ORDER — CEFAZOLIN SODIUM-DEXTROSE 2-4 GM/100ML-% IV SOLN: 2.0000 g | Freq: Once | INTRAVENOUS | Status: DC

## 2017-03-29 MED ORDER — INSULIN ASPART 100 UNIT/ML ~~LOC~~ SOLN
0.0000 [IU] | Freq: Every day | SUBCUTANEOUS | Status: DC
  Administered 2017-03-29: 2 [IU] via SUBCUTANEOUS

## 2017-03-29 MED ORDER — SODIUM CHLORIDE 0.9 % IV SOLN: 250.0000 mL | INTRAVENOUS | Status: DC | PRN

## 2017-03-29 MED ORDER — SODIUM CHLORIDE 0.9 % IV SOLN
INTRAVENOUS | Status: DC
  Administered 2017-03-29 – 2017-03-30 (×3): via INTRAVENOUS

## 2017-03-29 MED ORDER — CEFAZOLIN SODIUM-DEXTROSE 2-4 GM/100ML-% IV SOLN
INTRAVENOUS | Status: AC
  Administered 2017-03-29: 2000 mg
  Filled 2017-03-29: qty 100

## 2017-03-29 MED ORDER — SODIUM CHLORIDE 0.9% FLUSH
3.0000 mL | Freq: Two times a day (BID) | INTRAVENOUS | Status: DC
  Administered 2017-03-30: 3 mL via INTRAVENOUS

## 2017-03-29 MED ORDER — DIPHENHYDRAMINE HCL 12.5 MG/5ML PO ELIX: 12.5000 mg | ORAL_SOLUTION | Freq: Four times a day (QID) | ORAL | Status: DC | PRN

## 2017-03-29 MED ORDER — KETOROLAC TROMETHAMINE 30 MG/ML IJ SOLN
30.0000 mg | Freq: Four times a day (QID) | INTRAMUSCULAR | Status: DC
  Administered 2017-03-29 – 2017-03-30 (×3): 30 mg via INTRAVENOUS
  Filled 2017-03-29 (×3): qty 1

## 2017-03-29 MED ORDER — ONDANSETRON HCL 4 MG/2ML IJ SOLN: 4.0000 mg | Freq: Four times a day (QID) | INTRAMUSCULAR | Status: DC | PRN

## 2017-03-29 MED ORDER — FUROSEMIDE 20 MG PO TABS
40.0000 mg | ORAL_TABLET | Freq: Every day | ORAL | Status: DC
  Administered 2017-03-30: 40 mg via ORAL
  Filled 2017-03-29: qty 2

## 2017-03-29 MED ORDER — NALOXONE HCL 0.4 MG/ML IJ SOLN: 0.4000 mg | INTRAMUSCULAR | Status: DC | PRN

## 2017-03-29 MED ORDER — DIPHENHYDRAMINE HCL 50 MG/ML IJ SOLN
12.5000 mg | Freq: Four times a day (QID) | INTRAMUSCULAR | Status: DC | PRN
Start: 2017-03-29 — End: 2017-03-29

## 2017-03-29 MED ORDER — HYDROMORPHONE 1 MG/ML IV SOLN
INTRAVENOUS | Status: DC
  Administered 2017-03-29 (×2): 0.6 mg via INTRAVENOUS
  Administered 2017-03-30: 0.2 mg via INTRAVENOUS
  Administered 2017-03-30: 0.6 mg via INTRAVENOUS
  Filled 2017-03-29: qty 25

## 2017-03-29 MED ORDER — SODIUM CHLORIDE 0.9% FLUSH: 9.0000 mL | INTRAVENOUS | Status: DC | PRN

## 2017-03-29 MED ORDER — DIPHENHYDRAMINE HCL 12.5 MG/5ML PO ELIX
12.5000 mg | ORAL_SOLUTION | Freq: Four times a day (QID) | ORAL | Status: DC | PRN
  Filled 2017-03-29: qty 5

## 2017-03-29 MED ORDER — HYDROMORPHONE 1 MG/ML IV SOLN: INTRAVENOUS | Status: DC

## 2017-03-29 MED ORDER — DOCUSATE SODIUM 100 MG PO CAPS
100.0000 mg | ORAL_CAPSULE | Freq: Two times a day (BID) | ORAL | Status: DC
  Administered 2017-03-29 – 2017-03-30 (×2): 100 mg via ORAL
  Filled 2017-03-29 (×2): qty 1

## 2017-03-29 MED ORDER — MIDAZOLAM HCL 2 MG/2ML IJ SOLN
INTRAMUSCULAR | Status: AC
  Filled 2017-03-29: qty 4

## 2017-03-29 MED ORDER — IOPAMIDOL (ISOVUE-300) INJECTION 61%
INTRAVENOUS | Status: AC
  Administered 2017-03-29: 100 mL via INTRA_ARTERIAL
  Filled 2017-03-29: qty 300

## 2017-03-29 MED ORDER — HYDROMORPHONE 1 MG/ML IV SOLN
INTRAVENOUS | Status: DC
  Administered 2017-03-29: 2.1 mg via INTRAVENOUS

## 2017-03-29 MED ORDER — IOPAMIDOL (ISOVUE-300) INJECTION 61%
75.0000 mL | Freq: Once | INTRAVENOUS | Status: AC | PRN
  Administered 2017-03-29: 100 mL via INTRA_ARTERIAL

## 2017-03-29 MED ORDER — MIDAZOLAM HCL 2 MG/2ML IJ SOLN
INTRAMUSCULAR | Status: AC | PRN
  Administered 2017-03-29 (×2): 0.5 mg via INTRAVENOUS
  Administered 2017-03-29: 1 mg via INTRAVENOUS

## 2017-03-29 MED ORDER — LIDOCAINE HCL 1 % IJ SOLN
INTRAMUSCULAR | Status: AC | PRN
  Administered 2017-03-29: 5 mL

## 2017-03-29 MED ORDER — FENTANYL CITRATE (PF) 100 MCG/2ML IJ SOLN
INTRAMUSCULAR | Status: AC
  Filled 2017-03-29: qty 6

## 2017-03-29 MED ORDER — NALOXONE HCL 0.4 MG/ML IJ SOLN
INTRAMUSCULAR | Status: DC
Start: 2017-03-29 — End: 2017-03-29
  Filled 2017-03-29: qty 1

## 2017-03-29 MED ORDER — DEXAMETHASONE 4 MG PO TABS
8.0000 mg | ORAL_TABLET | ORAL | Status: AC
  Administered 2017-03-29: 8 mg via ORAL

## 2017-03-29 MED ORDER — ALBUTEROL SULFATE (2.5 MG/3ML) 0.083% IN NEBU
2.5000 mg | INHALATION_SOLUTION | Freq: Four times a day (QID) | RESPIRATORY_TRACT | Status: DC | PRN
Start: 2017-03-29 — End: 2017-03-30

## 2017-03-29 MED ORDER — ALBUTEROL SULFATE HFA 108 (90 BASE) MCG/ACT IN AERS: 2.0000 | INHALATION_SPRAY | Freq: Four times a day (QID) | RESPIRATORY_TRACT | Status: DC | PRN

## 2017-03-29 MED ORDER — DIVALPROEX SODIUM ER 500 MG PO TB24
500.0000 mg | ORAL_TABLET | Freq: Every day | ORAL | Status: DC
  Administered 2017-03-29 – 2017-03-30 (×2): 500 mg via ORAL
  Filled 2017-03-29: qty 1

## 2017-03-29 MED ORDER — PRAVASTATIN SODIUM 20 MG PO TABS
40.0000 mg | ORAL_TABLET | Freq: Every day | ORAL | Status: DC
  Administered 2017-03-29 – 2017-03-30 (×2): 40 mg via ORAL
  Filled 2017-03-29: qty 2

## 2017-03-29 MED ORDER — LEVOTHYROXINE SODIUM 50 MCG PO TABS
50.0000 ug | ORAL_TABLET | Freq: Every day | ORAL | Status: DC
  Administered 2017-03-30: 50 ug via ORAL
  Filled 2017-03-29 (×2): qty 1

## 2017-03-29 MED ORDER — HYDRALAZINE HCL 20 MG/ML IJ SOLN
10.0000 mg | Freq: Four times a day (QID) | INTRAMUSCULAR | Status: DC | PRN
Start: 2017-03-29 — End: 2017-03-30
  Administered 2017-03-29 – 2017-03-30 (×2): 10 mg via INTRAVENOUS
  Filled 2017-03-29 (×2): qty 1

## 2017-03-29 MED ORDER — PROMETHAZINE HCL 25 MG PO TABS: 25.0000 mg | ORAL_TABLET | Freq: Three times a day (TID) | ORAL | Status: DC | PRN

## 2017-03-29 MED ORDER — SODIUM CHLORIDE 0.9% FLUSH: 3.0000 mL | INTRAVENOUS | Status: DC | PRN

## 2017-03-29 MED ORDER — FENTANYL CITRATE (PF) 100 MCG/2ML IJ SOLN
INTRAMUSCULAR | Status: AC | PRN
  Administered 2017-03-29: 25 ug via INTRAVENOUS
  Administered 2017-03-29: 50 ug via INTRAVENOUS
  Administered 2017-03-29: 25 ug via INTRAVENOUS

## 2017-03-29 MED ORDER — LAMOTRIGINE 100 MG PO TABS
100.0000 mg | ORAL_TABLET | Freq: Every day | ORAL | Status: DC
  Administered 2017-03-29 – 2017-03-30 (×2): 100 mg via ORAL
  Filled 2017-03-29: qty 1

## 2017-03-29 MED ORDER — AMLODIPINE BESYLATE 5 MG PO TABS
5.0000 mg | ORAL_TABLET | Freq: Every day | ORAL | Status: DC
  Administered 2017-03-30: 5 mg via ORAL
  Filled 2017-03-29: qty 1

## 2017-03-29 MED ORDER — PROMETHAZINE HCL 25 MG RE SUPP: 25.0000 mg | Freq: Three times a day (TID) | RECTAL | Status: DC | PRN

## 2017-03-29 MED ORDER — IRBESARTAN 300 MG PO TABS
300.0000 mg | ORAL_TABLET | Freq: Every day | ORAL | Status: DC
  Administered 2017-03-30: 300 mg via ORAL
  Filled 2017-03-29: qty 1

## 2017-03-29 MED ORDER — INSULIN ASPART 100 UNIT/ML ~~LOC~~ SOLN
0.0000 [IU] | Freq: Three times a day (TID) | SUBCUTANEOUS | Status: DC
Start: 2017-03-30 — End: 2017-03-30
  Administered 2017-03-29: 5 [IU] via SUBCUTANEOUS
  Administered 2017-03-30: 8 [IU] via SUBCUTANEOUS

## 2017-03-29 MED ORDER — MIDAZOLAM HCL 2 MG/2ML IJ SOLN
INTRAMUSCULAR | Status: AC
  Filled 2017-03-29: qty 2

## 2017-03-29 MED ORDER — KETOROLAC TROMETHAMINE 30 MG/ML IJ SOLN
INTRAMUSCULAR | Status: AC
  Administered 2017-03-29: 30 mg
  Filled 2017-03-29: qty 1

## 2017-03-29 MED ORDER — FLUMAZENIL 0.5 MG/5ML IV SOLN
INTRAVENOUS | Status: AC
  Filled 2017-03-29: qty 5

## 2017-03-29 MED ORDER — KETOROLAC TROMETHAMINE 30 MG/ML IJ SOLN: 30.0000 mg | Freq: Once | INTRAMUSCULAR | Status: DC

## 2017-03-29 MED ORDER — HYDROMORPHONE 1 MG/ML IV SOLN
INTRAVENOUS | Status: AC
  Administered 2017-03-29: 10:00:00
  Filled 2017-03-29: qty 25

## 2017-03-29 MED ORDER — LIDOCAINE HCL 1 % IJ SOLN
INTRAMUSCULAR | Status: AC
  Filled 2017-03-29: qty 20

## 2017-03-29 NOTE — Procedures (Signed)
Bleeding uterine fibroids  s/p UFE  No comp  Stable  EBL 5CC  ADM TO OBS STATUS  FULL REPORT IN PACS

## 2017-03-29 NOTE — Progress Notes (Signed)
Patient ID: [name], female   DOB: 04/28/1971, 46 y.o.   MRN: [id number] Pt drowsy but arousable; has had some intermittent pelvic cramping; denies N/V VSS;AF abd soft,+BS, puncture site rt CFA soft, NT, no hematoma; intact distal pulses A/P: symptomatic uterine fibroids, s/p bilat [place] earlier today; for overnight obs; decrease dilaudid PCA dosing; f/u with Dr. Annamaria Boots in IR clinic in 1 month  [facility]

## 2017-03-29 NOTE — Progress Notes (Signed)
Inpatient Diabetes Program Recommendations  AACE/ADA: New Consensus Statement on Inpatient Glycemic Control (2015)  Target Ranges:  Prepandial:   less than 140 mg/dL      Peak postprandial:   less than 180 mg/dL (1-2 hours)      Critically ill patients:  140 - 180 mg/dL   Results for [name], [name] (MRN [id number]) as of 03/29/2017 09:25  Ref. Range 03/29/2017 07:59  Glucose-Capillary Latest Ref Range: 65 - 99 mg/dL 272 (H)   Review of Glycemic Control  Diabetes history: DM 2 Outpatient Diabetes medications: Metformin 1000 mg BID Current orders for Inpatient glycemic control: None  Inpatient Diabetes Program Recommendations:   Patient for procedure today. Glucose 270's. Post surgery please consider Novolog Moderate tid + HS scale while in the hospital. Consider resuming Metformin at discharge.  Thanks,  Tama Headings RN, MSN, [facility] Inpatient Diabetes Coordinator Team Pager [phone] (8a-5p)

## 2017-03-29 NOTE — H&P (Signed)
Referring Physician(s): Cousins,S  Supervising Physician: Daryll Brod  Patient Status:  [facility] OP TBA  Chief Complaint: Uterine fibroids, abnormal menstrual bleeding   Subjective: Patient familiar to IR service from prior consultation with Dr. Annamaria Boots on 11/14/16 to discuss treatment options for symptomatic uterine fibroids.She was deemed an appropriate candidate for bilateral uterine artery embolization and presents today for the procedure.She currently denies fever, headache, chest pain, dyspnea, cough, abdominal/back pain, nausea, vomiting. Past Medical History:  Diagnosis Date  . Anemia microcytic 03/31/2013  . Bipolar disorder ([place]) 03/31/2013  . CHF (congestive heart failure) ([place])   . Diabetes mellitus without complication ([place])   . Hyperlipidemia 03/31/2013  . Hypertension   . Obesity hypoventilation syndrome ([place])   . Plantar fasciitis of left foot   . Pneumonia   . Pneumothorax   . Pulmonary infiltrates   . Rhabdomyolysis 03/31/2013   [place] secondary to abilify  . Vitamin D deficiency 03/31/2013   Past Surgical History:  Procedure Laterality Date  . CESAREAN SECTION     x2  . CYST EXCISION    . DILATATION & CURETTAGE/HYSTEROSCOPY WITH MYOSURE N/A 02/17/2016   Procedure: DILATATION & CURETTAGE/HYSTEROSCOPY/Myomectomy WITH MYOSURE;  Surgeon: Aloha Gell, MD;  Location: [facility];  Service: Gynecology;  Laterality: N/A;  . IR GENERIC HISTORICAL  11/14/2016   IR RADIOLOGIST EVAL & MGMT 11/14/2016 Greggory Keen, MD [facility] INTERV RAD  . MYOMECTOMY N/A 02/17/2016   Procedure: Amado Coe WITH Jacklynn Barnacle;  Surgeon: Aloha Gell, MD;  Location: [facility];  Service: Gynecology;  Laterality: N/A;  . VENTRICULOPERITONEAL SHUNT        Allergies: Abilify [aripiprazole]  Medications: Prior to Admission medications   Medication Sig Start Date End Date Taking? Authorizing Provider  albuterol (PROVENTIL HFA;VENTOLIN HFA) 108 (90 Base) MCG/ACT inhaler Inhale 2 puffs into the lungs every  6 (six) hours as needed for wheezing or shortness of breath. 09/21/16  Yes Magdalen Spatz, NP  amLODipine (NORVASC) 5 MG tablet Take 5 mg by mouth daily.   Yes Historical Provider, MD  divalproex (DEPAKOTE ER) 500 MG 24 hr tablet Take 500 mg by mouth daily.    Yes Historical Provider, MD  FeFum-FePoly-FA-B Cmp-C-Biot (INTEGRA PLUS) CAPS Take 1 capsule by mouth daily. 06/29/16  Yes Historical Provider, MD  irbesartan (AVAPRO) 300 MG tablet Take 300 mg by mouth daily. 08/18/16  Yes Historical Provider, MD  lamoTRIgine (LAMICTAL) 200 MG tablet Take 100 mg by mouth daily.    Yes Historical Provider, MD  levothyroxine (SYNTHROID, LEVOTHROID) 50 MCG tablet Take 50 mcg by mouth daily before breakfast. 07/07/16  Yes Historical Provider, MD  metFORMIN (GLUCOPHAGE) 1000 MG tablet Take 1,000 mg by mouth 2 (two) times daily with a meal.   Yes Historical Provider, MD  Methylsulfonylmethane (MSM PO) Take 3 tablets by mouth daily.    Yes Historical Provider, MD  Multiple Vitamin (MULTIVITAMIN) LIQD Take 5 mLs by mouth daily.   Yes Historical Provider, MD  furosemide (LASIX) 40 MG tablet Take 1 tablet (40 mg total) by mouth daily. 09/12/16   Eugenie Filler, MD  pravastatin (PRAVACHOL) 80 MG tablet Take 40 mg by mouth daily.    Historical Provider, MD  traMADol (ULTRAM) 50 MG tablet Take 1 tablet (50 mg total) by mouth 2 (two) times daily as needed for severe pain. Patient taking differently: Take 50 mg by mouth 2 (two) times daily as needed for severe pain (migraines).  11/14/15   Carmin Muskrat, MD     Vital Signs:  BP (!) 156/103   Pulse 83   Temp 98.7 F (37.1 C) (Oral)   Resp 18   LMP 03/02/2017   SpO2 96%   Physical Exam Patient awake, alert. Chest clear to auscultation bilaterally. Heart with regular rate and rhythm. Abdomen obese, soft,+BS,NT; no LE edema  Imaging: No results found.  Labs:  CBC:  Recent Labs  09/09/16 0405 09/10/16 0353 09/11/16 0656 09/20/16 1918 03/29/17 0820  WBC  20.9* 17.4* 15.5*  --  7.1  HGB 11.6* 11.7* 12.5 15.6* 13.3  HCT 40.6 41.5 43.6 46.0 41.9  PLT 483* 487* 423*  --  376    COAGS: No results for input(s): INR, APTT in the last 8760 hours.  BMP:  Recent Labs  09/08/16 0459 09/09/16 0405 09/10/16 0353 09/11/16 0656 09/20/16 1918  NA 133* 134* 135 138 138  K 5.1 4.6 4.6 5.1 4.6  CL 91* 90* 89* 95* 96*  CO2 34* 35* 34* 36*  --   GLUCOSE 325* 237* 300* 65 432*  BUN 24* 29* 35* 26* 9  CALCIUM 9.6 9.7 9.3 9.4  --   CREATININE 0.85 0.81 0.93 0.79 0.80  GFRNONAA >60 >60 >60 >60  --   GFRAA >60 >60 >60 >60  --     LIVER FUNCTION TESTS:  Recent Labs  09/06/16 1437 09/07/16 0411  BILITOT 0.7 0.6  AST 100* 63*  ALT 329* 272*  ALKPHOS 77 87  PROT 6.5 6.4*  ALBUMIN 3.2* 2.9*    Assessment and Plan: Patient with history of symptomatic uterine fibroids; seen recently in consultation by Dr. Annamaria Boots and deemed an appropriate candidate for bilateral uterine artery embolization. She presents today for the procedure. Details/risks of procedure, including but not limited to, internal bleeding, infection,contrast nephropathy, nontarget embolization, need for possible hysterectomy, discussed with patient and husband with their understanding and consent. Post procedure she will be admitted for overnight observation for pain control.   Electronically Signed: D. Rowe Robert 03/29/2017, 8:46 AM   I spent a total of 30 minutes at the the patient's bedside AND on the patient's hospital floor or unit, greater than 50% of which was counseling/coordinating care for bilateral uterine artery embolization

## 2017-03-30 ENCOUNTER — Other Ambulatory Visit: Payer: Self-pay | Admitting: Radiology

## 2017-03-30 DIAGNOSIS — D259 Leiomyoma of uterus, unspecified: Secondary | ICD-10-CM

## 2017-03-30 MED ORDER — PROMETHAZINE HCL 25 MG PO TABS: 12.5000 mg | ORAL_TABLET | Freq: Three times a day (TID) | ORAL | 0 refills | Status: DC | PRN

## 2017-03-30 MED ORDER — OXYCODONE HCL 5 MG PO TABS: 5.0000 mg | ORAL_TABLET | Freq: Four times a day (QID) | ORAL | 0 refills | Status: DC | PRN

## 2017-03-30 MED ORDER — IBUPROFEN 800 MG PO TABS: 800.0000 mg | ORAL_TABLET | Freq: Three times a day (TID) | ORAL | 0 refills | Status: DC

## 2017-03-30 MED ORDER — DOCUSATE SODIUM 100 MG PO CAPS: 100.0000 mg | ORAL_CAPSULE | Freq: Two times a day (BID) | ORAL | 0 refills | Status: DC

## 2017-03-30 NOTE — Discharge Instructions (Signed)
Uterine Artery Embolization for Fibroids Uterine artery embolization is a nonsurgical treatment to shrink fibroids. A thin plastic tube (catheter) is used to inject material that blocks off the blood supply to the fibroid, which causes the fibroid to shrink. Tell a health care provider about:  Any allergies you have.  All medicines you are taking, including vitamins, herbs, eye drops, creams, and over-the-counter medicines.  Any problems you or family members have had with anesthetic medicines.  Any blood disorders you have.  Any surgeries you have had.  Any medical conditions you have. What are the risks?  Injury to the uterus from decreased blood supply  Infection.  Blood infection (septicemia).  Lack of menstrual periods (amenorrhea).  Death of tissue cells (necrosis) around your bladder or vulva.  Development of a hole between organs or from an organ to the surface of your skin (fistula).  Blood clot in the legs (deep vein thrombosis) or lung (pulmonary embolus). What happens before the procedure?  Ask your health care provider about changing or stopping your regular medicines.  Do not take aspirin or blood thinners (anticoagulants) for 1 week before the surgery or as directed by your health care provider.  Do not eat or drink anything for 8 hours before the surgery or as directed by your health care provider.  Empty your bladder before the procedure begins. What happens during the procedure?  An IV tube will be placed into one of your veins. This will be used to give you a sedative and pain medication (conscious sedation).  You will be given a medicine that numbs the area (local anesthetic).  A small cut will be made in your groin. A catheter is then inserted into the main artery of your leg.  The catheter will be guided through the artery to your uterus. A series of images will be taken while dye is injected through the catheter in your groin. X-rays are taken at  the same time. This is done to provide a road map of the blood supply to your uterus and fibroids.  Tiny plastic spheres, about the size of sand grains, will be injected through the catheter. Metal coils may be used to help block the artery. The particles will lodge in tiny branches of the uterine artery that supplies blood to the fibroids.  The procedure is repeated on the artery that supplies the other side of the uterus.  The catheter is then removed and pressure is held to stop any bleeding. No stitches are needed.  A dressing is then placed over the cut (incision). What happens after the procedure?  You will be taken to a recovery area where your progress will be monitored until you are awake, stable, and taking fluids well. If there are no other problems, you will then be moved to a regular hospital room.  You will be observed overnight in the hospital.  You will have cramping that should be controlled with pain medication. This information is not intended to replace advice given to you by your health care provider. Make sure you discuss any questions you have with your health care provider. Document Released: 02/19/2006 Document Revised: 05/11/2016 Document Reviewed: 06/19/2013 Elsevier Interactive Patient Education  2017 [employer].

## 2017-03-30 NOTE — Care Management Note (Signed)
Case Management Note  Patient Details  Name: [name] MRN: [id number] Date of Birth: [birth year]  Subjective/Objective:  No CM needs.                  Action/Plan:d/c home.   Expected Discharge Date:  03/30/17               Expected Discharge Plan:  Home/Self Care  In-House Referral:     Discharge planning Services  CM Consult  Post Acute Care Choice:    Choice offered to:     DME Arranged:    DME Agency:     HH Arranged:    HH Agency:     Status of Service:  Completed, signed off  If discussed at [employer] of Stay Meetings, dates discussed:    Additional Comments:  Dessa Phi, RN 03/30/2017, 10:44 AM

## 2017-03-30 NOTE — Progress Notes (Signed)
Dc'd to home via family car. Patient states she understands discharge instructions, RX given to patient.

## 2017-03-30 NOTE — Discharge Summary (Signed)
Patient ID: [name] MRN: [id number] DOB/AGE: 03/08/1971 45 y.o.  Admit date: 03/29/2017 Discharge date: 03/30/2017  Supervising Physician: Markus Daft  Patient Status: [facility] - In-pt  Admission Diagnoses: Symptomatic uterine fibroids  Discharge Diagnoses: Symptomatic uterine fibroids Active Problems:   Fibroids   Discharged Condition: good  Hospital Course: Pt admitted for observation after successful [place] procedure via right femoral artery approach. She had expected pain overnight but it is much better POD #1. Her foley was removed and she has voided without difficulty. She has eaten regular diet, no N/V. Pain control is adequate. She is stable for discharge. All medications, instructions, and follow up plans discussed in detail.  Consults: None  Treatments: Uterine fibroid embolization 4/12  Discharge Exam: Blood pressure (!) 136/95, pulse (!) 115, temperature 99.1 F (37.3 C), temperature source Oral, resp. rate (!) 21, height 4\' 9"  (1.448 m), weight 170 lb 6.7 oz (77.3 kg), last menstrual period 03/02/2017, SpO2 99 %. General: NAD Lungs: CTA without w/r/r Heart: Regular Abdomen:soft, mild tenderness low abdomen, no peritonitis. Ext:(R)groin soft, NT, no hematoma   Disposition: 01-Home or Self Care  Discharge Instructions    Call MD for:  difficulty breathing, headache or visual disturbances    Complete by:  As directed    Call MD for:  persistant nausea and vomiting    Complete by:  As directed    Call MD for:  redness, tenderness, or signs of infection (pain, swelling, redness, odor or green/yellow discharge around incision site)    Complete by:  As directed    Call MD for:  severe uncontrolled pain    Complete by:  As directed    Call MD for:  temperature >100.4    Complete by:  As directed    Diet - low sodium heart healthy    Complete by:  As directed    Increase activity slowly    Complete by:  As directed      Allergies as of 03/30/2017    Reactions   Abilify [aripiprazole] Other (See Comments)   Reaction:  Hyperglycemia      Medication List    TAKE these medications   albuterol 108 (90 Base) MCG/ACT inhaler Commonly known as:  PROVENTIL HFA;VENTOLIN HFA Inhale 2 puffs into the lungs every 6 (six) hours as needed for wheezing or shortness of breath.   amLODipine 5 MG tablet Commonly known as:  NORVASC Take 5 mg by mouth daily.   diclofenac sodium 1 % Gel Commonly known as:  VOLTAREN Apply 2 g topically 4 (four) times daily as needed (for pain). Pt applies to legs.   divalproex 500 MG 24 hr tablet Commonly known as:  DEPAKOTE ER Take 500 mg by mouth daily.   docusate sodium 100 MG capsule Commonly known as:  COLACE Take 1 capsule (100 mg total) by mouth 2 (two) times daily.   furosemide 40 MG tablet Commonly known as:  LASIX Take 1 tablet (40 mg total) by mouth daily. What changed:  when to take this  reasons to take this   ibuprofen 800 MG tablet Commonly known as:  ADVIL,MOTRIN Take 1 tablet (800 mg total) by mouth 3 (three) times daily.   INTEGRA PLUS Caps Take 1 capsule by mouth every evening.   irbesartan 300 MG tablet Commonly known as:  AVAPRO Take 300 mg by mouth daily.   lamoTRIgine 200 MG tablet Commonly known as:  LAMICTAL Take 100 mg by mouth daily.   levothyroxine 50 MCG tablet Commonly  known as:  SYNTHROID, LEVOTHROID Take 50 mcg by mouth daily before breakfast.   metFORMIN 1000 MG tablet Commonly known as:  GLUCOPHAGE Take 1,000 mg by mouth 2 (two) times daily with a meal.   MSM PO Take 3 tablets by mouth every evening.   multivitamin Liqd Take 30 mLs by mouth daily.   oxyCODONE 5 MG immediate release tablet Commonly known as:  ROXICODONE Take 1 tablet (5 mg total) by mouth every 6 (six) hours as needed for severe pain.   promethazine 25 MG tablet Commonly known as:  PHENERGAN Take 0.5 tablets (12.5 mg total) by mouth every 8 (eight) hours as needed for nausea.     traMADol 50 MG tablet Commonly known as:  ULTRAM Take 1 tablet (50 mg total) by mouth 2 (two) times daily as needed for severe pain.      Follow-up Information    Greggory Keen, MD. Go in 4 week(s).   Specialty:  Interventional Radiology Why:  Office will call to schedule follow up appointment date/time Contact information: [address] [place] [address] [phone]            Electronically Signed: Ascencion Dike 03/30/2017, 9:33 AM   I have spent Less Than 30 Minutes discharging [facility]

## 2017-03-31 LAB — GLUCOSE, CAPILLARY: GLUCOSE-CAPILLARY: 271 mg/dL — AB (ref 65–99)

## 2017-04-03 ENCOUNTER — Encounter: Payer: Self-pay | Admitting: *Deleted

## 2017-04-10 ENCOUNTER — Encounter: Payer: Self-pay | Admitting: Radiology

## 2017-04-26 ENCOUNTER — Inpatient Hospital Stay: Admission: RE | Admit: 2017-04-26 | Payer: Self-pay | Source: Ambulatory Visit

## 2017-05-16 ENCOUNTER — Ambulatory Visit
Admission: RE | Admit: 2017-05-16 | Discharge: 2017-05-16 | Disposition: A | Discharge status: Home / Self Care | Payer: Federal, State, Local not specified - PPO | Source: Ambulatory Visit | Attending: Radiology | Admitting: Radiology

## 2017-05-16 DIAGNOSIS — D259 Leiomyoma of uterus, unspecified: Secondary | ICD-10-CM

## 2017-05-16 HISTORY — PX: IR RADIOLOGIST EVAL & MGMT: IMG5224

## 2017-05-16 NOTE — Progress Notes (Signed)
Chief Complaint: F/U Uterine artery embolization  Referring Physician(s): S. Cousins  Supervising Physician: Daryll Brod  History of Present Illness: [name] is a 46 y.o. female with multiple chronic medical problems including hypertension, hyperlipidemia, type 2 diabetes, obesity, chronic diastolic heart failure, and bipolar disorder  She had been followed closely by her GYN for uterine fibroids.  She had a myomectomy March 4650 which was, complicated by spontaneous right pneumothorax requiring chest tube placement and prolonged hospitalization.  Despite the surgery, she has persistent heavy menstrual periods lasting 7 days.   She previously reported 4 heavy days with passage of blood clots was so heavy that she had to wear pads and Depends undergarments.   She also had a mild associated anemia.   Ultrasound confirms at least for fibroids largest measuring 5.4 cm in diameter.   MRI done 01/11/2017 showed the uterus measures 8.6 x 13.6 x 9.9 cm (735 cc). Multiple fibroids were evident. Dominant lesion is a 5.2 x 4.4 x 5.0 cm intramural right fundal lesion. Anterior lower uterine segment submucosal fibroid measured 3.8 x 3.9 x 4.0 cm and although it displaces the endometrial canal, it was not pedunculated into the endometrial cavity. 1.8 x 2.4 x 1.6 cm exophytic subserosal fibroid was identified extending from the posterior left uterine body. This lesion had a broad connection to the uterus without a discrete stalk. Multiple other intramural and subserosal fibroids of varying size were identified.  She underwent [place] by Dr. Annamaria Boots on 03/29/2017.  She is here today for her 6 week follow up.  She is doing very well.   Her LMP was about 2 weeks and she states it has greatly improved.   She reports she no longer needs to wear a  Depends.   She reports her groin stick site is healed.  She denies fever/chills or abdominal pain.  She states she only took one Oxycodone  and she didn't like the way it made her feel so she only took Advil for her post op pain which worked well.  Past Medical History:  Diagnosis Date  . Anemia microcytic 03/31/2013  . Bipolar disorder ([place]) 03/31/2013  . CHF (congestive heart failure) ([place])   . Diabetes mellitus without complication ([place])   . Hyperlipidemia 03/31/2013  . Hypertension   . Obesity hypoventilation syndrome ([place])   . Plantar fasciitis of left foot   . Pneumonia   . Pneumothorax   . Pulmonary infiltrates   . Rhabdomyolysis 03/31/2013   [place] secondary to abilify  . Vitamin D deficiency 03/31/2013    Past Surgical History:  Procedure Laterality Date  . CESAREAN SECTION     x2  . CYST EXCISION    . DILATATION & CURETTAGE/HYSTEROSCOPY WITH MYOSURE N/A 02/17/2016   Procedure: DILATATION & CURETTAGE/HYSTEROSCOPY/Myomectomy WITH MYOSURE;  Surgeon: Aloha Gell, MD;  Location: [facility];  Service: Gynecology;  Laterality: N/A;  . IR ANGIOGRAM PELVIS SELECTIVE OR SUPRASELECTIVE  03/29/2017  . IR ANGIOGRAM PELVIS SELECTIVE OR SUPRASELECTIVE  03/29/2017  . IR ANGIOGRAM SELECTIVE EACH ADDITIONAL VESSEL  03/29/2017  . IR ANGIOGRAM SELECTIVE EACH ADDITIONAL VESSEL  03/29/2017  . IR EMBO TUMOR ORGAN ISCHEMIA INFARCT INC GUIDE ROADMAPPING  03/29/2017  . IR GENERIC HISTORICAL  11/14/2016   IR RADIOLOGIST EVAL & MGMT 11/14/2016 Greggory Keen, MD [facility] INTERV RAD  . IR US GUIDE VASC ACCESS RIGHT  03/29/2017  . MYOMECTOMY N/A 02/17/2016   Procedure: Amado Coe WITH Jacklynn Barnacle;  Surgeon: Aloha Gell, MD;  Location: [facility];  Service:  Gynecology;  Laterality: N/A;  . VENTRICULOPERITONEAL SHUNT      Allergies: Abilify [aripiprazole]  Medications: Prior to Admission medications   Medication Sig Start Date End Date Taking? Authorizing Provider  albuterol (PROVENTIL HFA;VENTOLIN HFA) 108 (90 Base) MCG/ACT inhaler Inhale 2 puffs into the lungs every 6 (six) hours as needed for wheezing or shortness of breath. 09/21/16  Yes Magdalen Spatz, NP  amLODipine (NORVASC) 5 MG tablet Take 10 mg by mouth daily.    Yes [provider]  diclofenac sodium (VOLTAREN) 1 % GEL Apply 2 g topically 4 (four) times daily as needed (for pain). Pt applies to legs.   Yes [provider]  divalproex (DEPAKOTE ER) 500 MG 24 hr tablet Take 500 mg by mouth daily.    Yes [provider]  docusate sodium (COLACE) 100 MG capsule Take 1 capsule (100 mg total) by mouth 2 (two) times daily. 03/30/17  Yes Bruning, Lennette Bihari, PA-C  FeFum-FePoly-FA-B Cmp-C-Biot (INTEGRA PLUS) CAPS Take 1 capsule by mouth every evening.    Yes [provider]  furosemide (LASIX) 40 MG tablet Take 1 tablet (40 mg total) by mouth daily. Patient taking differently: Take 40 mg by mouth daily as needed for edema.  09/12/16  Yes Eugenie Filler, MD  ibuprofen (ADVIL,MOTRIN) 800 MG tablet Take 1 tablet (800 mg total) by mouth 3 (three) times daily. 03/30/17  Yes Bruning, Lennette Bihari, PA-C  irbesartan (AVAPRO) 300 MG tablet Take 300 mg by mouth daily.   Yes [provider]  lamoTRIgine (LAMICTAL) 200 MG tablet Take 100 mg by mouth daily.    Yes [provider]  levothyroxine (SYNTHROID, LEVOTHROID) 50 MCG tablet Take 50 mcg by mouth daily before breakfast.   Yes [provider]  metFORMIN (GLUCOPHAGE) 1000 MG tablet Take 1,000 mg by mouth 2 (two) times daily with a meal.   Yes [provider]  Methylsulfonylmethane (MSM PO) Take 3 tablets by mouth every evening.    Yes [provider]  Multiple Vitamin (MULTIVITAMIN) LIQD Take 30 mLs by mouth daily.    Yes [provider]  traMADol (ULTRAM) 50 MG tablet Take 1 tablet (50 mg total) by mouth 2 (two) times daily as needed for severe pain. 11/14/15  Yes Carmin Muskrat, MD  oxyCODONE (ROXICODONE) 5 MG immediate release tablet Take 1 tablet (5 mg total) by mouth every 6 (six) hours as needed for severe pain. Patient not taking: Reported on 05/16/2017 03/30/17    Ascencion Dike, PA-C  promethazine (PHENERGAN) 25 MG tablet Take 0.5 tablets (12.5 mg total) by mouth every 8 (eight) hours as needed for nausea. Patient not taking: Reported on 05/16/2017 03/30/17   Ascencion Dike, PA-C     Family History  Problem Relation Age of Onset  . Diabetes Mother   . Hyperlipidemia Mother   . Hypertension Mother   . Lung cancer Father   . Hypertension Brother     Social History   Social History  . Marital status: Married    Spouse name: N/A  . Number of children: 2  . Years of education: N/A   Occupational History  . [employer] service [employer]   Social History Main Topics  . Smoking status: Never Smoker  . Smokeless tobacco: Never Used  . Alcohol use No  . Drug use: No  . Sexual activity: Yes    Birth control/ protection: None   Other Topics Concern  . Not on file   Social History Narrative  .  No narrative on file    Review of Systems: A 12 point ROS discussed  Review of Systems  Constitutional: Negative.   HENT: Negative.   Respiratory: Negative.   Cardiovascular: Negative.   Gastrointestinal: Negative.   Genitourinary: Negative.   Musculoskeletal: Negative.   Skin: Negative.   Neurological: Negative.   Hematological: Negative.   Psychiatric/Behavioral: Negative.     Vital Signs: BP 117/76   Pulse 85   Temp 97.8 F (36.6 C) (Oral)   Resp 14   Ht 4\' 9"  (1.448 m)   Wt 170 lb (77.1 kg)   LMP 04/25/2017   SpO2 99%   BMI 36.79 kg/m   Physical Exam  Constitutional: She is oriented to person, place, and time. She appears well-developed and well-nourished.  HENT:  Head: Normocephalic and atraumatic.  Eyes: EOM are normal.  Neck: Normal range of motion.  Cardiovascular: Normal rate, regular rhythm and normal heart sounds.   No murmur heard. Pulmonary/Chest: Effort normal and breath sounds normal. No respiratory distress. She has no wheezes.  Abdominal: Soft. She exhibits no distension. There is no tenderness.    Musculoskeletal: Normal range of motion.  Neurological: She is alert and oriented to person, place, and time.  Skin: Skin is warm and dry.  Psychiatric: She has a normal mood and affect. Her behavior is normal. Judgment and thought content normal.  Vitals reviewed.    Imaging: No results found.  Labs:  CBC:  Recent Labs  09/09/16 0405 09/10/16 0353 09/11/16 0656 09/20/16 1918 03/29/17 0820  WBC 20.9* 17.4* 15.5*  --  7.1  HGB 11.6* 11.7* 12.5 15.6* 13.3  HCT 40.6 41.5 43.6 46.0 41.9  PLT 483* 487* 423*  --  376    COAGS:  Recent Labs  03/29/17 0820  INR 0.95  APTT 28    BMP:  Recent Labs  09/09/16 0405 09/10/16 0353 09/11/16 0656 09/20/16 1918 03/29/17 0820  NA 134* 135 138 138 135  K 4.6 4.6 5.1 4.6 4.0  CL 90* 89* 95* 96* 99*  CO2 35* 34* 36*  --  28  GLUCOSE 237* 300* 65 432* 277*  BUN 29* 35* 26* 9 10  CALCIUM 9.7 9.3 9.4  --  9.0  CREATININE 0.81 0.93 0.79 0.80 0.55  GFRNONAA >60 >60 >60  --  >60  GFRAA >60 >60 >60  --  >60    LIVER FUNCTION TESTS:  Recent Labs  09/06/16 1437 09/07/16 0411  BILITOT 0.7 0.6  AST 100* 63*  ALT 329* 272*  ALKPHOS 77 87  PROT 6.5 6.4*  ALBUMIN 3.2* 2.9*    TUMOR MARKERS: No results for input(s): AFPTM, CEA, CA199, CHROMGRNA in the last 8760 hours.  Assessment:  History of symptomatic uterine fibroids  S/P [place] by Dr.Shick on 03/29/2017.  Will call her in 3 months and make sure she continues to do well.  Return in 6 months with MRI.  Electronically Signed: Murrell Redden PA-C 05/16/2017, 10:07 AM   Please refer to Dr. Fritz Pickerel attestation of this note for management and plan.

## 2017-06-01 ENCOUNTER — Encounter: Payer: Self-pay | Admitting: Interventional Radiology

## 2017-09-10 ENCOUNTER — Other Ambulatory Visit (HOSPITAL_COMMUNITY): Payer: Self-pay | Admitting: Interventional Radiology

## 2017-09-10 DIAGNOSIS — D259 Leiomyoma of uterus, unspecified: Secondary | ICD-10-CM

## 2017-09-24 ENCOUNTER — Other Ambulatory Visit: Payer: Self-pay | Admitting: *Deleted

## 2017-09-24 DIAGNOSIS — D250 Submucous leiomyoma of uterus: Secondary | ICD-10-CM

## 2017-09-28 ENCOUNTER — Ambulatory Visit (HOSPITAL_COMMUNITY)
Admission: RE | Admit: 2017-09-28 | Payer: Federal, State, Local not specified - PPO | Source: Ambulatory Visit | Admitting: Interventional Radiology

## 2017-10-01 ENCOUNTER — Ambulatory Visit (HOSPITAL_COMMUNITY)
Admission: RE | Admit: 2017-10-01 | Discharge: 2017-10-01 | Disposition: A | Discharge status: Home / Self Care | Payer: Federal, State, Local not specified - PPO | Source: Ambulatory Visit | Attending: Interventional Radiology | Admitting: Interventional Radiology

## 2017-10-01 DIAGNOSIS — D259 Leiomyoma of uterus, unspecified: Secondary | ICD-10-CM

## 2017-10-01 DIAGNOSIS — K5730 Diverticulosis of large intestine without perforation or abscess without bleeding: Secondary | ICD-10-CM | POA: Diagnosis not present

## 2017-10-01 MED ORDER — GADOBENATE DIMEGLUMINE 529 MG/ML IV SOLN
20.0000 mL | Freq: Once | INTRAVENOUS | Status: AC | PRN
  Administered 2017-10-01: 16 mL via INTRAVENOUS

## 2017-10-02 ENCOUNTER — Other Ambulatory Visit: Payer: Self-pay

## 2017-10-02 LAB — POCT I-STAT CREATININE: CREATININE: 0.5 mg/dL (ref 0.44–1.00)

## 2017-10-18 ENCOUNTER — Other Ambulatory Visit: Payer: Self-pay

## 2018-02-16 IMAGING — CT CT ANGIO CHEST
2 of 6 series · 18 of 36 positions shown · IV contrast (Omni 300)
Comparison: CT chest 06/13/2016. Single-view of the chest this same
day and PA and lateral chest 05/17/2016.

CLINICAL DATA: Shortness of breath and wheezing for 2 weeks.

EXAM:
CT ANGIOGRAPHY CHEST WITH CONTRAST
TECHNIQUE: Multidetector CT imaging of the chest was performed using the
standard protocol during bolus administration of intravenous
contrast. Multiplanar CT image reconstructions and MIPs were
obtained to evaluate the vascular anatomy.
CONTRAST:  100 cc Omnipaque 350.

[Series 6: pe thins · axial · 0.81mm/px · z∈[-284,-27]mm · 17 of 291 slices shown]
[im 17/291  lung]
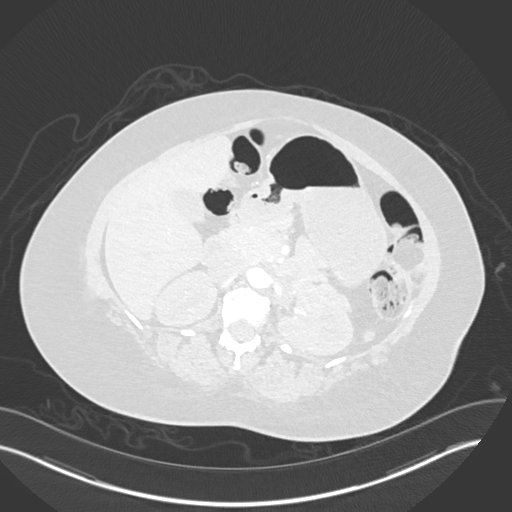
[im 33/291  mediastinal]
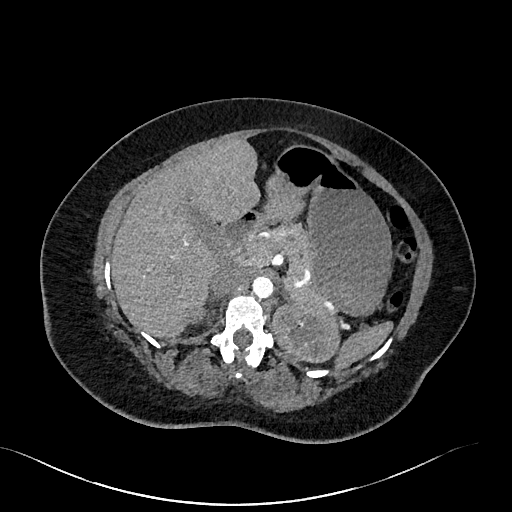
[im 49/291  lung]
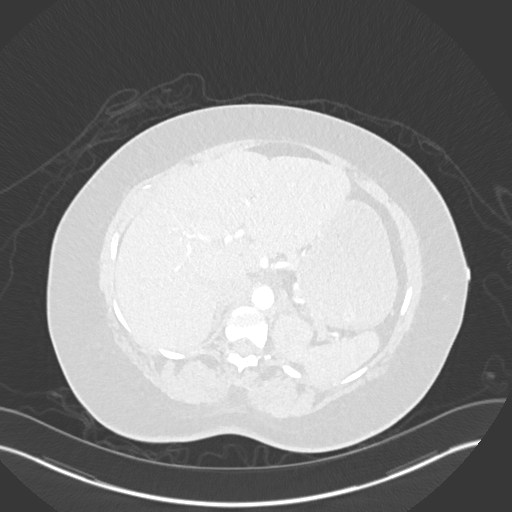
[im 65/291  mediastinal]
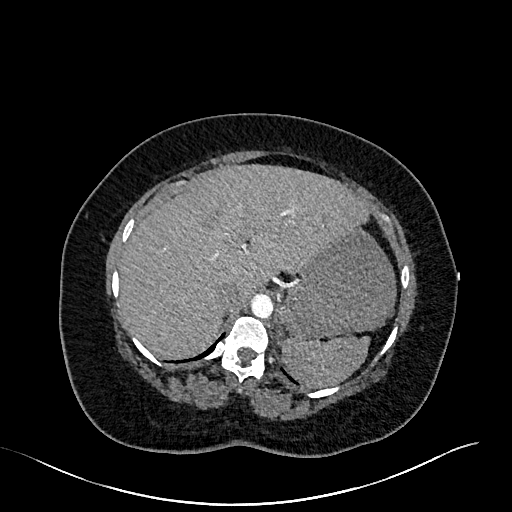
[im 81/291  lung]
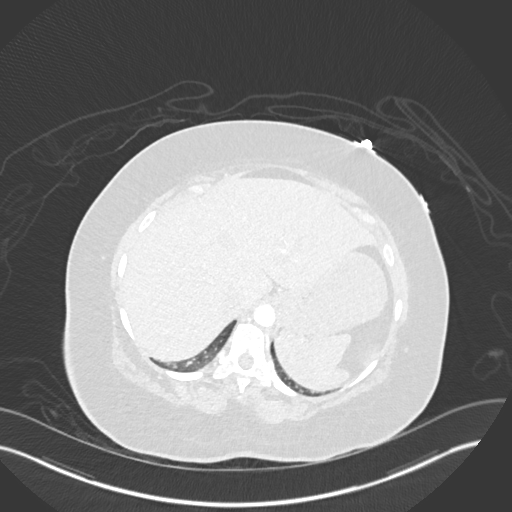
[im 97/291  mediastinal]
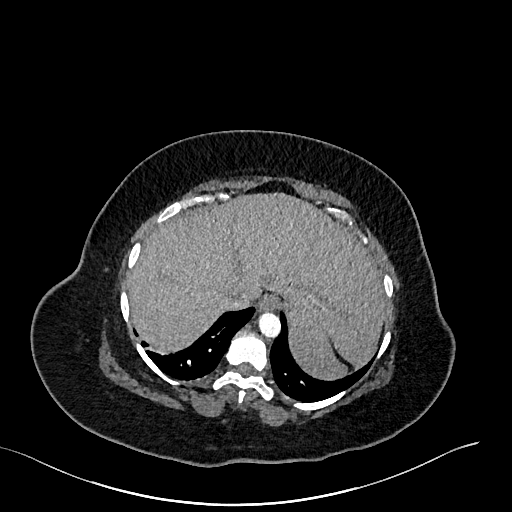
[im 113/291  lung]
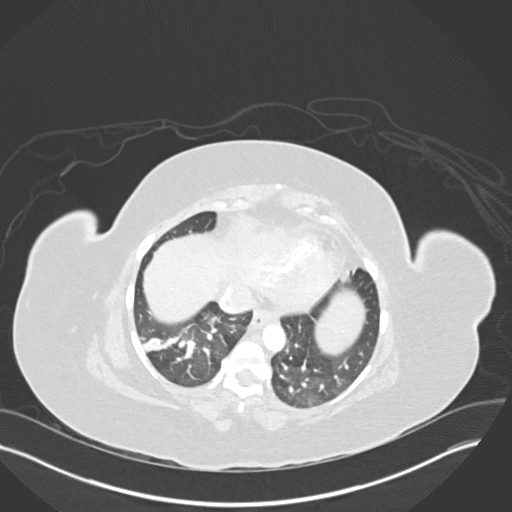
[im 129/291  mediastinal]
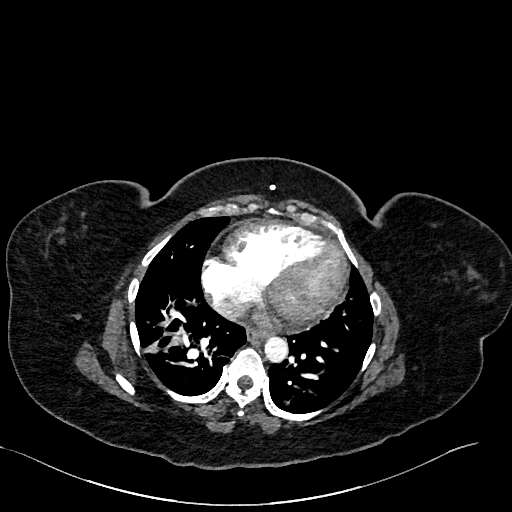
[im 146/291  lung]
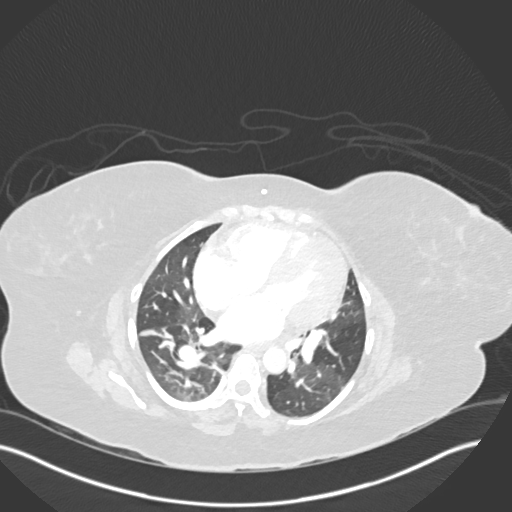
[im 162/291  mediastinal]
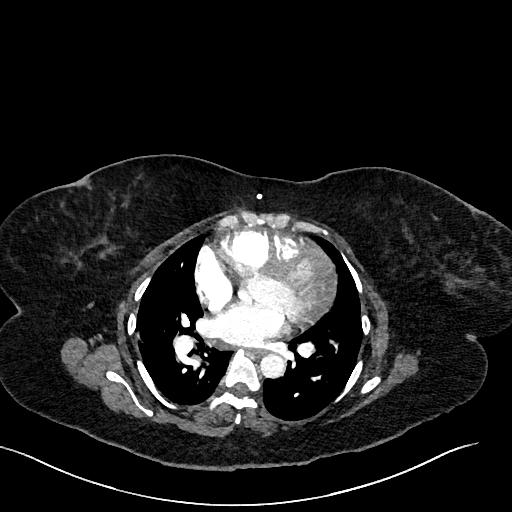
[im 178/291  lung]
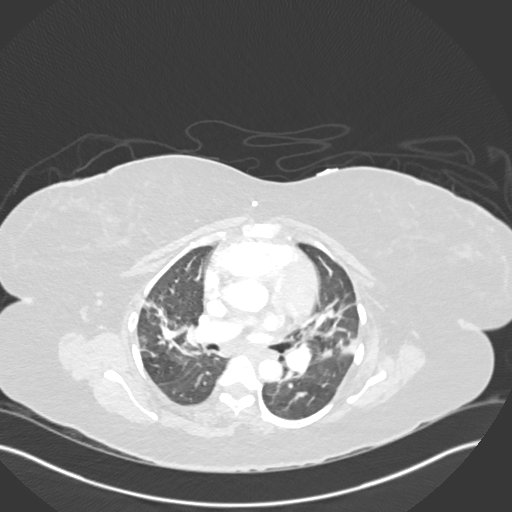
[im 194/291  mediastinal]
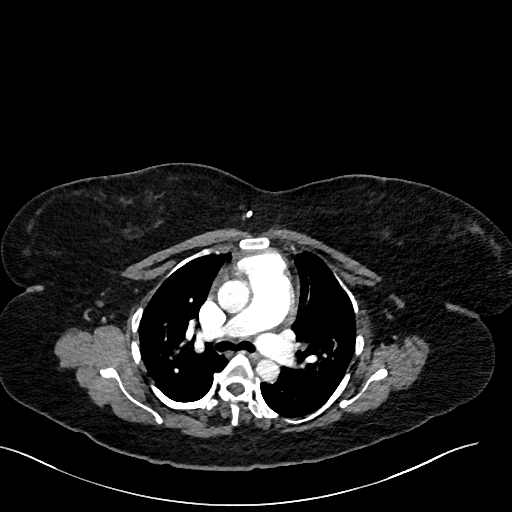
[im 210/291  lung]
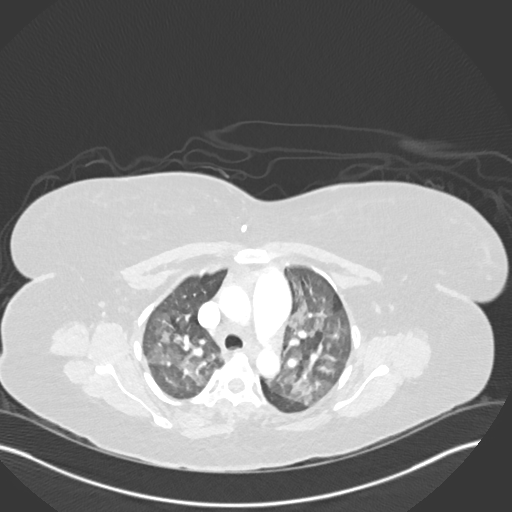
[im 226/291  mediastinal]
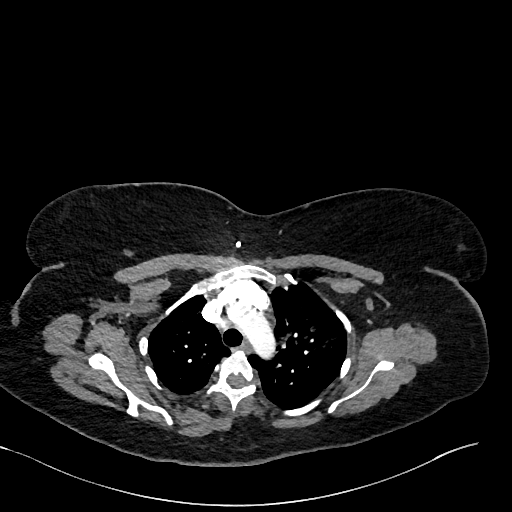
[im 242/291  lung]
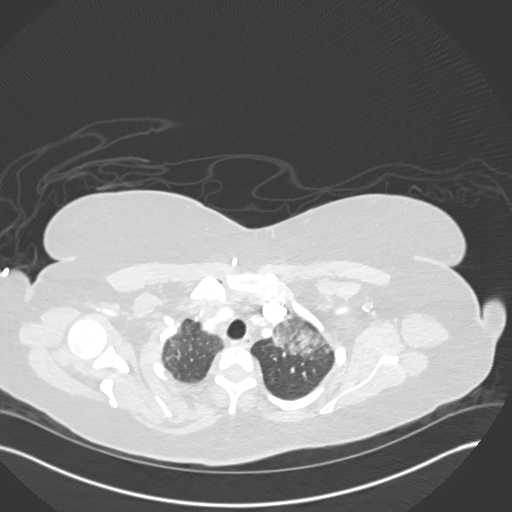
[im 258/291  mediastinal]
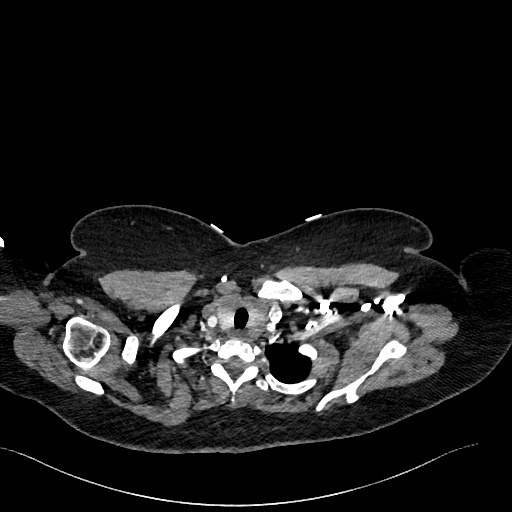
[im 274/291  lung]
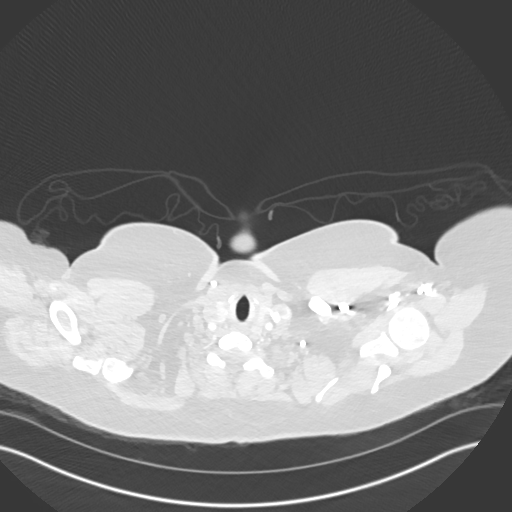

[Series 7: pe 2mm cor · coronal · 0.54mm/px · 1 of 130 slices shown]
[im 65/130  mediastinal]
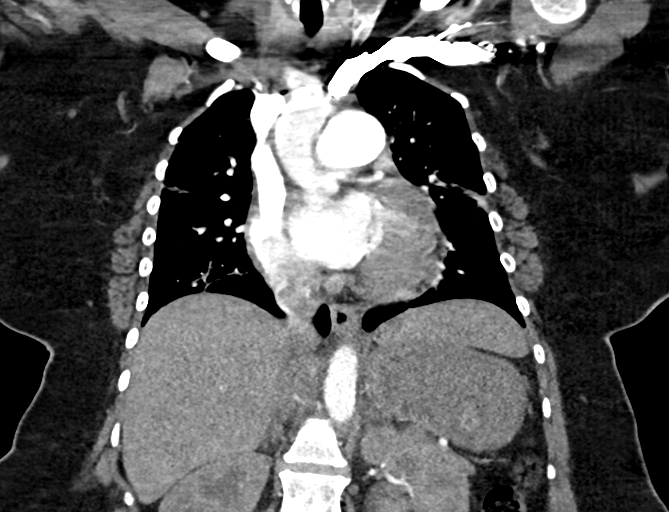

[18 of 36 positions shown; findings below may reference images not displayed]

FINDINGS: Cardiovascular: No pulmonary embolus is identified. The main
pulmonary artery measures 3.2 cm transverse and the ascending aorta
mm 2.3 cm transverse compatible pulmonary arterial hypertension.
There is cardiomegaly. No pericardial effusion.

Mediastinum/Nodes: No pathologically enlarged lymph nodes are
identified.

Lungs/Pleura: No pleural effusion. Ground glass attenuation is seen
in the upper lobes with full mosaic pattern. Basilar atelectasis is
noted.

Upper Abdomen: Unremarkable.

Musculoskeletal: No focal abnormality. Nodule in the right breast is
unchanged since the most recent study.

Review of the MIP images confirms the above findings.
IMPRESSION: Core negative for pulmonary embolus.

Ground-glass attenuation the upper lobes could be due to cryptogenic
organizing pneumonia, hypersensitivity pneumonitis, edema or
reactive airways disease.

Cardiomegaly.

Findings compatible with pulmonary arterial hypertension.

## 2019-06-10 ENCOUNTER — Other Ambulatory Visit: Payer: Self-pay | Admitting: Family Medicine

## 2019-06-10 DIAGNOSIS — M4726 Other spondylosis with radiculopathy, lumbar region: Secondary | ICD-10-CM

## 2019-07-27 ENCOUNTER — Other Ambulatory Visit: Payer: Self-pay

## 2019-07-27 ENCOUNTER — Ambulatory Visit
Admission: RE | Admit: 2019-07-27 | Discharge: 2019-07-27 | Disposition: A | Discharge status: Home / Self Care | Payer: Federal, State, Local not specified - PPO | Source: Ambulatory Visit | Attending: Family Medicine | Admitting: Family Medicine

## 2019-07-27 DIAGNOSIS — M4726 Other spondylosis with radiculopathy, lumbar region: Secondary | ICD-10-CM

## 2019-10-13 ENCOUNTER — Inpatient Hospital Stay (HOSPITAL_COMMUNITY)
Admission: EM | Admit: 2019-10-13 | Discharge: 2019-10-17 | DRG: 190 | Disposition: A | Discharge status: Home / Self Care | Payer: Federal, State, Local not specified - PPO | Attending: Internal Medicine | Admitting: Internal Medicine

## 2019-10-13 ENCOUNTER — Ambulatory Visit (HOSPITAL_COMMUNITY)
Admission: EM | Admit: 2019-10-13 | Discharge: 2019-10-13 | Disposition: A | Discharge status: Home / Self Care | Payer: Federal, State, Local not specified - PPO | Attending: Family Medicine | Admitting: Family Medicine

## 2019-10-13 ENCOUNTER — Other Ambulatory Visit: Payer: Self-pay

## 2019-10-13 ENCOUNTER — Encounter (HOSPITAL_COMMUNITY): Payer: Self-pay | Admitting: Emergency Medicine

## 2019-10-13 ENCOUNTER — Emergency Department (HOSPITAL_COMMUNITY): Payer: Federal, State, Local not specified - PPO

## 2019-10-13 DIAGNOSIS — R0902 Hypoxemia: Secondary | ICD-10-CM

## 2019-10-13 DIAGNOSIS — J9601 Acute respiratory failure with hypoxia: Secondary | ICD-10-CM | POA: Diagnosis present

## 2019-10-13 DIAGNOSIS — Z791 Long term (current) use of non-steroidal anti-inflammatories (NSAID): Secondary | ICD-10-CM

## 2019-10-13 DIAGNOSIS — Z20828 Contact with and (suspected) exposure to other viral communicable diseases: Secondary | ICD-10-CM | POA: Diagnosis present

## 2019-10-13 DIAGNOSIS — J209 Acute bronchitis, unspecified: Secondary | ICD-10-CM | POA: Diagnosis present

## 2019-10-13 DIAGNOSIS — E662 Morbid (severe) obesity with alveolar hypoventilation: Secondary | ICD-10-CM | POA: Diagnosis present

## 2019-10-13 DIAGNOSIS — J449 Chronic obstructive pulmonary disease, unspecified: Secondary | ICD-10-CM | POA: Diagnosis present

## 2019-10-13 DIAGNOSIS — Z8349 Family history of other endocrine, nutritional and metabolic diseases: Secondary | ICD-10-CM

## 2019-10-13 DIAGNOSIS — I160 Hypertensive urgency: Secondary | ICD-10-CM | POA: Diagnosis present

## 2019-10-13 DIAGNOSIS — Z7984 Long term (current) use of oral hypoglycemic drugs: Secondary | ICD-10-CM

## 2019-10-13 DIAGNOSIS — I2721 Secondary pulmonary arterial hypertension: Secondary | ICD-10-CM | POA: Diagnosis present

## 2019-10-13 DIAGNOSIS — Z8709 Personal history of other diseases of the respiratory system: Secondary | ICD-10-CM | POA: Diagnosis not present

## 2019-10-13 DIAGNOSIS — Z833 Family history of diabetes mellitus: Secondary | ICD-10-CM

## 2019-10-13 DIAGNOSIS — Z8249 Family history of ischemic heart disease and other diseases of the circulatory system: Secondary | ICD-10-CM

## 2019-10-13 DIAGNOSIS — D72829 Elevated white blood cell count, unspecified: Secondary | ICD-10-CM | POA: Diagnosis present

## 2019-10-13 DIAGNOSIS — Z888 Allergy status to other drugs, medicaments and biological substances status: Secondary | ICD-10-CM

## 2019-10-13 DIAGNOSIS — Z79890 Hormone replacement therapy (postmenopausal): Secondary | ICD-10-CM

## 2019-10-13 DIAGNOSIS — Z8679 Personal history of other diseases of the circulatory system: Secondary | ICD-10-CM | POA: Diagnosis not present

## 2019-10-13 DIAGNOSIS — J441 Chronic obstructive pulmonary disease with (acute) exacerbation: Secondary | ICD-10-CM

## 2019-10-13 DIAGNOSIS — I10 Essential (primary) hypertension: Secondary | ICD-10-CM | POA: Diagnosis present

## 2019-10-13 DIAGNOSIS — Z9119 Patient's noncompliance with other medical treatment and regimen: Secondary | ICD-10-CM

## 2019-10-13 DIAGNOSIS — R062 Wheezing: Secondary | ICD-10-CM

## 2019-10-13 DIAGNOSIS — R0602 Shortness of breath: Secondary | ICD-10-CM | POA: Diagnosis not present

## 2019-10-13 DIAGNOSIS — Z801 Family history of malignant neoplasm of trachea, bronchus and lung: Secondary | ICD-10-CM

## 2019-10-13 DIAGNOSIS — E118 Type 2 diabetes mellitus with unspecified complications: Secondary | ICD-10-CM | POA: Diagnosis present

## 2019-10-13 DIAGNOSIS — I110 Hypertensive heart disease with heart failure: Secondary | ICD-10-CM | POA: Diagnosis present

## 2019-10-13 DIAGNOSIS — E039 Hypothyroidism, unspecified: Secondary | ICD-10-CM | POA: Diagnosis present

## 2019-10-13 DIAGNOSIS — F319 Bipolar disorder, unspecified: Secondary | ICD-10-CM | POA: Diagnosis present

## 2019-10-13 DIAGNOSIS — Z6838 Body mass index (BMI) 38.0-38.9, adult: Secondary | ICD-10-CM

## 2019-10-13 DIAGNOSIS — Z9114 Patient's other noncompliance with medication regimen: Secondary | ICD-10-CM

## 2019-10-13 DIAGNOSIS — E785 Hyperlipidemia, unspecified: Secondary | ICD-10-CM | POA: Diagnosis present

## 2019-10-13 DIAGNOSIS — E119 Type 2 diabetes mellitus without complications: Secondary | ICD-10-CM | POA: Diagnosis present

## 2019-10-13 DIAGNOSIS — Z79891 Long term (current) use of opiate analgesic: Secondary | ICD-10-CM

## 2019-10-13 DIAGNOSIS — Z79899 Other long term (current) drug therapy: Secondary | ICD-10-CM

## 2019-10-13 DIAGNOSIS — I5032 Chronic diastolic (congestive) heart failure: Secondary | ICD-10-CM | POA: Diagnosis present

## 2019-10-13 LAB — COMPREHENSIVE METABOLIC PANEL
ALT: 35 U/L (ref 0–44)
AST: 25 U/L (ref 15–41)
Albumin: 3.1 g/dL — ABNORMAL LOW (ref 3.5–5.0)
Alkaline Phosphatase: 73 U/L (ref 38–126)
Anion gap: 11 (ref 5–15)
BUN: 9 mg/dL (ref 6–20)
CO2: 28 mmol/L (ref 22–32)
Calcium: 8.6 mg/dL — ABNORMAL LOW (ref 8.9–10.3)
Chloride: 101 mmol/L (ref 98–111)
Creatinine, Ser: 0.83 mg/dL (ref 0.44–1.00)
GFR calc Af Amer: >60 mL/min (ref >60)
GFR calc non Af Amer: >60 mL/min (ref >60)
Glucose, Bld: 188 mg/dL — ABNORMAL HIGH (ref 70–99)
Potassium: 4 mmol/L (ref 3.5–5.1)
Sodium: 140 mmol/L (ref 135–145)
Total Bilirubin: 0.7 mg/dL (ref 0.3–1.2)
Total Protein: 6.1 g/dL — ABNORMAL LOW (ref 6.5–8.1)

## 2019-10-13 LAB — CBC WITH DIFFERENTIAL/PLATELET
Abs Immature Granulocytes: 0.1 10*3/uL — ABNORMAL HIGH (ref 0.00–0.07)
Basophils Absolute: 0.1 10*3/uL (ref 0.0–0.1)
Basophils Relative: 1 %
Eosinophils Absolute: 0.1 10*3/uL (ref 0.0–0.5)
Eosinophils Relative: 1 %
HCT: 46.6 % — ABNORMAL HIGH (ref 36.0–46.0)
Hemoglobin: 13.9 g/dL (ref 12.0–15.0)
Immature Granulocytes: 1 %
Lymphocytes Relative: 15 %
Lymphs Abs: 1.7 10*3/uL (ref 0.7–4.0)
MCH: 28 pg (ref 26.0–34.0)
MCHC: 29.8 g/dL — ABNORMAL LOW (ref 30.0–36.0)
MCV: 93.8 fL (ref 80.0–100.0)
Monocytes Absolute: 1.1 10*3/uL — ABNORMAL HIGH (ref 0.1–1.0)
Monocytes Relative: 10 %
Neutro Abs: 8.2 10*3/uL — ABNORMAL HIGH (ref 1.7–7.7)
Neutrophils Relative %: 72 %
Platelets: 362 10*3/uL (ref 150–400)
RBC: 4.97 MIL/uL (ref 3.87–5.11)
RDW: 13.7 % (ref 11.5–15.5)
WBC: 11.2 10*3/uL — ABNORMAL HIGH (ref 4.0–10.5)
nRBC: 0.4 % — ABNORMAL HIGH (ref 0.0–0.2)

## 2019-10-13 LAB — I-STAT BETA HCG BLOOD, ED (MC, WL, AP ONLY): I-stat hCG, quantitative: <5 m[IU]/mL (ref <5)

## 2019-10-13 NOTE — ED Triage Notes (Signed)
Transferred by EMS from [facility] with SOB worse with exertion onset last week , denies chest pain / no fever or chills . O2 4lpm/[place] applied by EMS .

## 2019-10-13 NOTE — ED Triage Notes (Signed)
Patient presents to Urgent Care with complaints of shortness of breath since the past few days, worse today. Patient reports she has been put on Cipap in the past but "they took it from me because I was not compliant". Pt states she came here tonight to check her oxygen levels. Upon assessment, pt found to be 70% on room air, did not respond to 2L but did to 4L via nasal cannula. Provider notified, recommending EMS transport to the ED for further evaluation and testing. EMS called, will continue to monitor patient, 98% on 4L at present. Vitals:   10/13/19 2011  BP: (!) 182/108  Pulse: 95  Resp: (!) 26  Temp: 98.9 F (37.2 C)  SpO2: (!) 70%

## 2019-10-13 NOTE — ED Provider Notes (Signed)
[name] presents with complaints of wheezing sensation. States she presents today to get her oxygen checked as she has been hypoxic in the past when she has felt this way. States she has been prescribed oxygen as well as CPAP in the past but she has been non compliant with it therefor no longer prescribed. No cough, congestion, fever or obvious shortness of breath. Feels tightness and wheezing. Denies smoking. Denies asthma or COPD. Per chart review history  Of CHF and respiratory failure. On arrival to room patient found to have O2 of 70-77% on RA. She is speaking in clear sentences without increased work of breathing. 4L [place] applied and O2 up to 100%. Lung sounds diminished throughout. EMS called for transport with O2 to the ER for further evaluation, patient agreeable.     Zigmund Gottron, NP 10/13/2019 8:20 PM    Zigmund Gottron, NP 10/13/19 2020

## 2019-10-13 NOTE — Discharge Instructions (Signed)
You will be transported to the ER with O2 for further evaluation of your hypoxia

## 2019-10-14 ENCOUNTER — Emergency Department (HOSPITAL_COMMUNITY): Payer: Federal, State, Local not specified - PPO

## 2019-10-14 ENCOUNTER — Encounter (HOSPITAL_COMMUNITY): Payer: Self-pay

## 2019-10-14 DIAGNOSIS — I5032 Chronic diastolic (congestive) heart failure: Secondary | ICD-10-CM | POA: Diagnosis not present

## 2019-10-14 DIAGNOSIS — E118 Type 2 diabetes mellitus with unspecified complications: Secondary | ICD-10-CM

## 2019-10-14 DIAGNOSIS — J441 Chronic obstructive pulmonary disease with (acute) exacerbation: Principal | ICD-10-CM

## 2019-10-14 DIAGNOSIS — I10 Essential (primary) hypertension: Secondary | ICD-10-CM

## 2019-10-14 DIAGNOSIS — F3170 Bipolar disorder, currently in remission, most recent episode unspecified: Secondary | ICD-10-CM | POA: Diagnosis not present

## 2019-10-14 DIAGNOSIS — J9601 Acute respiratory failure with hypoxia: Secondary | ICD-10-CM | POA: Diagnosis not present

## 2019-10-14 DIAGNOSIS — I2721 Secondary pulmonary arterial hypertension: Secondary | ICD-10-CM

## 2019-10-14 DIAGNOSIS — E039 Hypothyroidism, unspecified: Secondary | ICD-10-CM | POA: Diagnosis present

## 2019-10-14 DIAGNOSIS — D72829 Elevated white blood cell count, unspecified: Secondary | ICD-10-CM

## 2019-10-14 LAB — PROCALCITONIN: Procalcitonin: <0.1 ng/mL

## 2019-10-14 LAB — HEMOGLOBIN A1C
Hgb A1c MFr Bld: 8.8 % — ABNORMAL HIGH (ref 4.8–5.6)
Mean Plasma Glucose: 205.86 mg/dL

## 2019-10-14 LAB — HIV ANTIBODY (ROUTINE TESTING W REFLEX): HIV Screen 4th Generation wRfx: NONREACTIVE

## 2019-10-14 LAB — TSH: TSH: 1.71 u[IU]/mL (ref 0.350–4.500)

## 2019-10-14 LAB — BRAIN NATRIURETIC PEPTIDE: B Natriuretic Peptide: 143.8 pg/mL — ABNORMAL HIGH (ref 0.0–100.0)

## 2019-10-14 LAB — CBG MONITORING, ED: Glucose-Capillary: 281 mg/dL — ABNORMAL HIGH (ref 70–99)

## 2019-10-14 LAB — SARS CORONAVIRUS 2 (TAT 6-24 HRS): SARS Coronavirus 2: NEGATIVE

## 2019-10-14 MED ORDER — AMLODIPINE BESYLATE 10 MG PO TABS
10.0000 mg | ORAL_TABLET | Freq: Every day | ORAL | Status: DC
  Administered 2019-10-14 – 2019-10-17 (×4): 10 mg via ORAL
  Filled 2019-10-14 (×2): qty 1
  Filled 2019-10-14: qty 2
  Filled 2019-10-14 (×2): qty 1

## 2019-10-14 MED ORDER — IOHEXOL 350 MG/ML SOLN
100.0000 mL | Freq: Once | INTRAVENOUS | Status: AC | PRN
  Administered 2019-10-14: 50 mL via INTRAVENOUS

## 2019-10-14 MED ORDER — INSULIN ASPART 100 UNIT/ML ~~LOC~~ SOLN: 0.0000 [IU] | Freq: Every day | SUBCUTANEOUS | Status: DC

## 2019-10-14 MED ORDER — METHYLPREDNISOLONE SODIUM SUCC 125 MG IJ SOLR
125.0000 mg | Freq: Once | INTRAMUSCULAR | Status: AC
  Administered 2019-10-14: 125 mg via INTRAVENOUS
  Filled 2019-10-14: qty 2

## 2019-10-14 MED ORDER — ONDANSETRON HCL 4 MG PO TABS: 4.0000 mg | ORAL_TABLET | Freq: Four times a day (QID) | ORAL | Status: DC | PRN

## 2019-10-14 MED ORDER — SODIUM CHLORIDE 0.9 % IV SOLN
INTRAVENOUS | Status: DC | PRN
  Administered 2019-10-14: 250 mL via INTRAVENOUS

## 2019-10-14 MED ORDER — METHYLPREDNISOLONE SODIUM SUCC 125 MG IJ SOLR: 80.0000 mg | Freq: Three times a day (TID) | INTRAMUSCULAR | Status: DC

## 2019-10-14 MED ORDER — INSULIN ASPART 100 UNIT/ML ~~LOC~~ SOLN
0.0000 [IU] | Freq: Three times a day (TID) | SUBCUTANEOUS | Status: DC
  Administered 2019-10-15: 8 [IU] via SUBCUTANEOUS
  Administered 2019-10-15: 3 [IU] via SUBCUTANEOUS
  Administered 2019-10-15: 11 [IU] via SUBCUTANEOUS
  Administered 2019-10-16: 8 [IU] via SUBCUTANEOUS
  Administered 2019-10-16 (×2): 11 [IU] via SUBCUTANEOUS

## 2019-10-14 MED ORDER — DIVALPROEX SODIUM ER 500 MG PO TB24
500.0000 mg | ORAL_TABLET | Freq: Every day | ORAL | Status: DC
  Administered 2019-10-14 – 2019-10-17 (×4): 500 mg via ORAL
  Filled 2019-10-14 (×5): qty 1

## 2019-10-14 MED ORDER — LAMOTRIGINE 25 MG PO TABS
100.0000 mg | ORAL_TABLET | Freq: Every day | ORAL | Status: DC
  Administered 2019-10-14 – 2019-10-17 (×4): 100 mg via ORAL
  Filled 2019-10-14 (×2): qty 4
  Filled 2019-10-14: qty 1
  Filled 2019-10-14: qty 4

## 2019-10-14 MED ORDER — METHYLPREDNISOLONE SODIUM SUCC 125 MG IJ SOLR
60.0000 mg | Freq: Two times a day (BID) | INTRAMUSCULAR | Status: DC
  Administered 2019-10-14 – 2019-10-17 (×6): 60 mg via INTRAVENOUS
  Filled 2019-10-14 (×6): qty 2

## 2019-10-14 MED ORDER — INSULIN ASPART 100 UNIT/ML ~~LOC~~ SOLN: 0.0000 [IU] | Freq: Three times a day (TID) | SUBCUTANEOUS | Status: DC

## 2019-10-14 MED ORDER — GUAIFENESIN ER 600 MG PO TB12
600.0000 mg | ORAL_TABLET | Freq: Two times a day (BID) | ORAL | Status: DC
  Administered 2019-10-14 – 2019-10-17 (×7): 600 mg via ORAL
  Filled 2019-10-14 (×7): qty 1

## 2019-10-14 MED ORDER — DIPHENHYDRAMINE HCL 50 MG/ML IJ SOLN
25.0000 mg | Freq: Once | INTRAMUSCULAR | Status: AC
  Administered 2019-10-14: 25 mg via INTRAVENOUS
  Filled 2019-10-14: qty 1

## 2019-10-14 MED ORDER — PROCHLORPERAZINE EDISYLATE 10 MG/2ML IJ SOLN
10.0000 mg | Freq: Once | INTRAMUSCULAR | Status: AC
  Administered 2019-10-14: 10 mg via INTRAVENOUS
  Filled 2019-10-14: qty 2

## 2019-10-14 MED ORDER — ACETAMINOPHEN 325 MG PO TABS
650.0000 mg | ORAL_TABLET | Freq: Four times a day (QID) | ORAL | Status: DC | PRN
  Administered 2019-10-15 – 2019-10-16 (×2): 650 mg via ORAL
  Filled 2019-10-14 (×3): qty 2

## 2019-10-14 MED ORDER — ONDANSETRON HCL 4 MG/2ML IJ SOLN: 4.0000 mg | Freq: Four times a day (QID) | INTRAMUSCULAR | Status: DC | PRN

## 2019-10-14 MED ORDER — IRBESARTAN 300 MG PO TABS
300.0000 mg | ORAL_TABLET | Freq: Every day | ORAL | Status: DC
  Administered 2019-10-14 – 2019-10-17 (×4): 300 mg via ORAL
  Filled 2019-10-14 (×4): qty 1

## 2019-10-14 MED ORDER — FUROSEMIDE 10 MG/ML IJ SOLN
40.0000 mg | Freq: Once | INTRAMUSCULAR | Status: AC
  Administered 2019-10-14: 40 mg via INTRAVENOUS
  Filled 2019-10-14: qty 4

## 2019-10-14 MED ORDER — ENOXAPARIN SODIUM 40 MG/0.4ML ~~LOC~~ SOLN
40.0000 mg | SUBCUTANEOUS | Status: DC
  Administered 2019-10-14 – 2019-10-16 (×3): 40 mg via SUBCUTANEOUS
  Filled 2019-10-14 (×3): qty 0.4

## 2019-10-14 MED ORDER — SODIUM CHLORIDE 0.9% FLUSH
3.0000 mL | Freq: Two times a day (BID) | INTRAVENOUS | Status: DC
  Administered 2019-10-14 – 2019-10-16 (×5): 3 mL via INTRAVENOUS
  Administered 2019-10-16: 10 mL via INTRAVENOUS
  Administered 2019-10-17: 3 mL via INTRAVENOUS

## 2019-10-14 MED ORDER — LEVOTHYROXINE SODIUM 50 MCG PO TABS
50.0000 ug | ORAL_TABLET | Freq: Every day | ORAL | Status: DC
  Administered 2019-10-15 – 2019-10-17 (×3): 50 ug via ORAL
  Filled 2019-10-14 (×3): qty 1

## 2019-10-14 MED ORDER — ALBUTEROL SULFATE HFA 108 (90 BASE) MCG/ACT IN AERS
2.0000 | INHALATION_SPRAY | Freq: Four times a day (QID) | RESPIRATORY_TRACT | Status: DC
  Administered 2019-10-14 – 2019-10-15 (×3): 2 via RESPIRATORY_TRACT
  Filled 2019-10-14: qty 6.7

## 2019-10-14 MED ORDER — ACETAMINOPHEN 650 MG RE SUPP: 650.0000 mg | Freq: Four times a day (QID) | RECTAL | Status: DC | PRN

## 2019-10-14 MED ORDER — ALBUTEROL SULFATE HFA 108 (90 BASE) MCG/ACT IN AERS
3.0000 | INHALATION_SPRAY | Freq: Once | RESPIRATORY_TRACT | Status: AC
  Administered 2019-10-14: 3 via RESPIRATORY_TRACT
  Filled 2019-10-14: qty 6.7

## 2019-10-14 MED ORDER — SODIUM CHLORIDE 0.9 % IV SOLN
500.0000 mg | INTRAVENOUS | Status: DC
  Administered 2019-10-14 – 2019-10-15 (×2): 500 mg via INTRAVENOUS
  Filled 2019-10-14 (×3): qty 500

## 2019-10-14 MED ORDER — LABETALOL HCL 5 MG/ML IV SOLN: 5.0000 mg | INTRAVENOUS | Status: DC | PRN

## 2019-10-14 NOTE — ED Notes (Signed)
Patient transported to CT 

## 2019-10-14 NOTE — ED Notes (Signed)
Ambulated Pt while on O2:  Pt began at around 96% Room Air while in bed and began to desat midway through ambulation. Pt got as low as 85% and maintained at around the same saturation. Complained of no dizziness or SOB but did appear SOB once returned to bed.  Placed on 3L [place] and is now 92% and has no complaints besides a HA.

## 2019-10-14 NOTE — H&P (Addendum)
History and Physical    [name] [id number] DOB: 01/10/1971 DOA: 10/13/2019  Referring MD/NP/PA: Shirlyn Goltz, MD PCP: Stacy Axe, MD  Patient coming from: Urgent care via EMS  Chief Complaint: Shortness of breath  I have personally briefly reviewed patient's old medical records in [place]   HPI: [name] is a 48 y.o. female with medical history significant of HTN, HLD, diastolic CHF, diabetes mellitus type 2, bipolar disorder, obstructive sleep apnea no longer on CPAP,, and obesity.  She presents with 4-5-day history of progressively worsening shortness of breath.  Any kind of exertion made her extremely short of breath.  She reports having associated symptoms of wheezing, but denies having any significant cough.  Associated symptoms include complaints that her weight is up 4 pounds.  Patient was admitted into the hospital back in 2017 for COPD exacerbation and concern for cryptogenic organizing pneumonia based off of groundglass opacities.  Following her discharge from the hospital at that time she had initially been sent home with oxygen, but it was discontinued sometime ago.  Denies having any fever, chills, chest pain, nausea, vomiting, orthopnea, leg pain, leg swelling, or diarrhea symptoms.  She initially went to urgent care, but was sent to the emergency department for further evaluation on 4 L of nasal cannula oxygen.  ED Course: Upon admission to the emergency department patient was noted to be hypoxic into the 70s on room air with improvement after being placed on 4 L nasal cannula oxygen.  Labs significant for WBC 11.2, and BNP 143.8.  CT angiogram of the chest was performed which showed progressive enlargement of pulmonary arteries consistent with pulmonary artery hypertension, atelectasis in the right lower lobe, and chronic ground glass opacities similar to previous studies.  Patient was given 125 mg of Solu-Medrol IV, Benadryl and 3 puffs of albuterol.  TRH  called to admit for suspected COPD exacerbation.     Past Medical History:  Diagnosis Date   Anemia microcytic 03/31/2013   Bipolar disorder ([place]) 03/31/2013   CHF (congestive heart failure) (HCC)    Diabetes mellitus without complication ([place])    Hyperlipidemia 03/31/2013   Hypertension    Obesity hypoventilation syndrome (HCC)    Plantar fasciitis of left foot    Pneumonia    Pneumothorax    Pulmonary infiltrates    Rhabdomyolysis 03/31/2013   [facility] secondary to abilify   Vitamin D deficiency 03/31/2013    Past Surgical History:  Procedure Laterality Date   CESAREAN SECTION     x2   CYST EXCISION     DILATATION & CURETTAGE/HYSTEROSCOPY WITH MYOSURE N/A 02/17/2016   Procedure: DILATATION & CURETTAGE/HYSTEROSCOPY/Myomectomy WITH MYOSURE;  Surgeon: Aloha Gell, MD;  Location: [facility];  Service: Gynecology;  Laterality: N/A;   IR ANGIOGRAM PELVIS SELECTIVE OR SUPRASELECTIVE  03/29/2017   IR ANGIOGRAM PELVIS SELECTIVE OR SUPRASELECTIVE  03/29/2017   IR ANGIOGRAM SELECTIVE EACH ADDITIONAL VESSEL  03/29/2017   IR ANGIOGRAM SELECTIVE EACH ADDITIONAL VESSEL  03/29/2017   IR EMBO TUMOR ORGAN ISCHEMIA INFARCT INC GUIDE ROADMAPPING  03/29/2017   IR GENERIC HISTORICAL  11/14/2016   IR RADIOLOGIST EVAL & MGMT 11/14/2016 Greggory Keen, MD [facility] INTERV RAD   IR RADIOLOGIST EVAL & MGMT  05/16/2017   IR US GUIDE VASC ACCESS RIGHT  03/29/2017   MYOMECTOMY N/A 02/17/2016   Procedure: Amado Coe WITH Jacklynn Barnacle;  Surgeon: Aloha Gell, MD;  Location: [facility];  Service: Gynecology;  Laterality: N/A;   VENTRICULOPERITONEAL SHUNT  reports that she has never smoked. She has never used smokeless tobacco. She reports that she does not drink alcohol or use drugs.  Allergies  Allergen Reactions   Abilify [Aripiprazole] Other (See Comments)    Reaction:  Hyperglycemia    Family History  Problem Relation Age of Onset   Diabetes Mother    Hyperlipidemia Mother     Hypertension Mother    Lung cancer Father    Hypertension Brother     Prior to Admission medications   Medication Sig Start Date End Date Taking? Authorizing Provider  albuterol (PROVENTIL HFA;VENTOLIN HFA) 108 (90 Base) MCG/ACT inhaler Inhale 2 puffs into the lungs every 6 (six) hours as needed for wheezing or shortness of breath. 09/21/16   Magdalen Spatz, NP  amLODipine (NORVASC) 5 MG tablet Take 10 mg by mouth daily.     [provider]  diclofenac sodium (VOLTAREN) 1 % GEL Apply 2 g topically 4 (four) times daily as needed (for pain). Pt applies to legs.    [provider]  divalproex (DEPAKOTE ER) 500 MG 24 hr tablet Take 500 mg by mouth daily.     [provider]  docusate sodium (COLACE) 100 MG capsule Take 1 capsule (100 mg total) by mouth 2 (two) times daily. 03/30/17   Ascencion Dike, PA-C  FeFum-FePoly-FA-B Cmp-C-Biot (INTEGRA PLUS) CAPS Take 1 capsule by mouth every evening.     [provider]  furosemide (LASIX) 40 MG tablet Take 1 tablet (40 mg total) by mouth daily. Patient taking differently: Take 40 mg by mouth daily as needed for edema.  09/12/16   Eugenie Filler, MD  ibuprofen (ADVIL,MOTRIN) 800 MG tablet Take 1 tablet (800 mg total) by mouth 3 (three) times daily. 03/30/17   Ascencion Dike, PA-C  irbesartan (AVAPRO) 300 MG tablet Take 300 mg by mouth daily.    [provider]  lamoTRIgine (LAMICTAL) 200 MG tablet Take 100 mg by mouth daily.     [provider]  levothyroxine (SYNTHROID, LEVOTHROID) 50 MCG tablet Take 50 mcg by mouth daily before breakfast.    [provider]  metFORMIN (GLUCOPHAGE) 1000 MG tablet Take 1,000 mg by mouth 2 (two) times daily with a meal.    [provider]  Methylsulfonylmethane (MSM PO) Take 3 tablets by mouth every evening.     [provider]  Multiple Vitamin (MULTIVITAMIN) LIQD Take 30 mLs by mouth daily.     [provider]  oxyCODONE  (ROXICODONE) 5 MG immediate release tablet Take 1 tablet (5 mg total) by mouth every 6 (six) hours as needed for severe pain. Patient not taking: Reported on 05/16/2017 03/30/17   Ascencion Dike, PA-C  promethazine (PHENERGAN) 25 MG tablet Take 0.5 tablets (12.5 mg total) by mouth every 8 (eight) hours as needed for nausea. Patient not taking: Reported on 05/16/2017 03/30/17   Ascencion Dike, PA-C  traMADol (ULTRAM) 50 MG tablet Take 1 tablet (50 mg total) by mouth 2 (two) times daily as needed for severe pain. 11/14/15   Carmin Muskrat, MD    Physical Exam:  Constitutional: Middle-aged female who appears to be in NAD, calm, comfortable Vitals:   10/14/19 1034 10/14/19 1139 10/14/19 1230 10/14/19 1300  BP: (!) 188/119 (!) 164/103 (!) 150/106 (!) 161/102  Pulse: 90  88 84  Resp: 20 19 (!) 22 14  Temp:      TempSrc:      SpO2: 98% 100% 96% 97%  Weight:  Height:       Eyes: PERRL, lids and conjunctivae normal ENMT: Mucous membranes are moist. Posterior pharynx clear of any exudate or lesions.   Neck: normal, supple, no masses, no thyromegaly Respiratory: Decreased overall aeration with minimum wheezes noted at this time.  Patient currently on 2 L of nasal cannula oxygen containing O2 saturations. Cardiovascular: Regular rate and rhythm, no murmurs / rubs / gallops.  Trace lower extremity edema. 2+ pedal pulses. No carotid bruits.  Abdomen: no tenderness, no masses palpated. No hepatosplenomegaly. Bowel sounds positive.  Musculoskeletal: no clubbing / cyanosis. No joint deformity upper and lower extremities. Good ROM, no contractures. Normal muscle tone.  Skin: no rashes, lesions, ulcers. No induration Neurologic: CN 2-12 grossly intact. Sensation intact, DTR normal. Strength 5/5 in all 4.  Psychiatric: Normal judgment and insight. Alert and oriented x 3. Normal mood.     Labs on Admission: I have personally reviewed following labs and imaging studies  CBC: Recent Labs  Lab  10/13/19 2140  WBC 11.2*  NEUTROABS 8.2*  HGB 13.9  HCT 46.6*  MCV 93.8  PLT 123XX123   Basic Metabolic Panel: Recent Labs  Lab 10/13/19 2140  NA 140  K 4.0  CL 101  CO2 28  GLUCOSE 188*  BUN 9  CREATININE 0.83  CALCIUM 8.6*   GFR: Estimated Creatinine Clearance: 72.2 mL/min (by C-G formula based on SCr of 0.83 mg/dL). Liver Function Tests: Recent Labs  Lab 10/13/19 2140  AST 25  ALT 35  ALKPHOS 73  BILITOT 0.7  PROT 6.1*  ALBUMIN 3.1*   No results for input(s): LIPASE, AMYLASE in the last 168 hours. No results for input(s): AMMONIA in the last 168 hours. Coagulation Profile: No results for input(s): INR, PROTIME in the last 168 hours. Cardiac Enzymes: No results for input(s): CKTOTAL, CKMB, CKMBINDEX, TROPONINI in the last 168 hours. BNP (last 3 results) No results for input(s): PROBNP in the last 8760 hours. HbA1C: No results for input(s): HGBA1C in the last 72 hours. CBG: No results for input(s): GLUCAP in the last 168 hours. Lipid Profile: No results for input(s): CHOL, HDL, LDLCALC, TRIG, CHOLHDL, LDLDIRECT in the last 72 hours. Thyroid Function Tests: No results for input(s): TSH, T4TOTAL, FREET4, T3FREE, THYROIDAB in the last 72 hours. Anemia Panel: No results for input(s): VITAMINB12, FOLATE, FERRITIN, TIBC, IRON, RETICCTPCT in the last 72 hours. Urine analysis:    Component Value Date/Time   COLORURINE YELLOW 02/19/2016 1815   APPEARANCEUR CLOUDY (A) 02/19/2016 1815   LABSPEC 1.013 02/19/2016 1815   PHURINE 5.0 02/19/2016 1815   GLUCOSEU NEGATIVE 02/19/2016 1815   HGBUR NEGATIVE 02/19/2016 1815   BILIRUBINUR NEGATIVE 02/19/2016 1815   KETONESUR NEGATIVE 02/19/2016 1815   PROTEINUR NEGATIVE 02/19/2016 1815   UROBILINOGEN 0.2 04/01/2013 1014   NITRITE NEGATIVE 02/19/2016 1815   LEUKOCYTESUR SMALL (A) 02/19/2016 1815   Sepsis Labs: No results found for this or any previous visit (from the past 240 hour(s)).   Radiological Exams on  Admission: Dg Chest 2 View  Result Date: 10/13/2019 CLINICAL DATA:  Wheezing sensation and shortness of breath for 1 week, fever, oxygen desaturation, history CHF, diabetes mellitus, hypertension EXAM: CHEST - 2 VIEW COMPARISON:  11/23/2016 FINDINGS: VP shunt tubing traverses RIGHT hemithorax. Enlargement of cardiac silhouette. Mediastinal contours and pulmonary vascularity normal. Mild bronchitic changes with bibasilar atelectasis. Lungs otherwise clear. Minimal chronic thickening at minor fissure. No definite acute infiltrate, pleural effusion, or pneumothorax. RIGHT upper lobe nodular density 10 x 9 mm.  Questionable LEFT perihilar nodular density versus prominent vascular marking. IMPRESSION: Enlargement of cardiac silhouette. Bronchitic changes with bibasilar atelectasis. RIGHT upper lobe nodular density 10 x 9 mm with questionable LEFT perihilar nodular density versus prominent vascular marking; further evaluation by CT imaging of the chest recommended, with contrast if patient's renal function permits. Electronically Signed   By: Lavonia Dana M.D.   On: 10/13/2019 22:04   Ct Angio Chest Pe W/cm &/or Wo Cm  Result Date: 10/14/2019 CLINICAL DATA:  Shortness of breath. EXAM: CT ANGIOGRAPHY CHEST WITH CONTRAST TECHNIQUE: Multidetector CT imaging of the chest was performed using the standard protocol during bolus administration of intravenous contrast. Multiplanar CT image reconstructions and MIPs were obtained to evaluate the vascular anatomy. CONTRAST:  70mL OMNIPAQUE IOHEXOL 350 MG/ML SOLN COMPARISON:  Chest x-ray dated 10/13/2019 and CT angiogram of the chest dated 09/06/2016 FINDINGS: Cardiovascular: No pulmonary emboli. There is mild cardiomegaly. There is chronic dilatation of the pulmonary arteries, increased to 3.6 mm of the main pulmonary artery from 3.2 mm on the prior study. No pericardial effusion. No coronary artery calcifications or aortic atherosclerosis. Mediastinum/Nodes: No enlarged  mediastinal, hilar, or axillary lymph nodes. Thyroid gland, trachea, and esophagus demonstrate no significant findings. Lungs/Pleura: Patient has a chronic ground-glass mosaic pattern of patchy densities in both lungs, similar to that seen on the prior study. New area of atelectasis in the anterior aspect of the right lower lobe. Areas of atelectasis or posteriorly in the right lower lobe on the prior study have resolved. There is bilateral chronic peribronchial thickening. Upper Abdomen: Chronic hepatomegaly. No acute abnormalities. Musculoskeletal: No chest wall abnormality. No acute or significant osseous findings. Review of the MIP images confirms the above findings. IMPRESSION: 1. No pulmonary emboli. 2. Progressive enlargement of the pulmonary arteries consistent with pulmonary arterial hypertension. 3. Chronic ground-glass mosaic pattern of patchy densities in both lungs, similar to that seen on the prior study, likely related to the patient's pulmonary arterial hypertension. 4. New area of atelectasis in the anterior aspect of the right lower lobe. 5. Chronic bilateral peribronchial thickening. 6. Chronic hepatomegaly. Aortic Atherosclerosis (ICD10-I70.0). Electronically Signed   By: Lorriane Shire M.D.   On: 10/14/2019 11:04    EKG: Independently reviewed.  Sinus rhythm 82 bpm.  Assessment/Plan Acute respiratory failure with hypoxia secondary to COPD exacerbation: Patient presents with progressively worsening shortness of breath over the last 4 to 5 days.  O2 saturations in 70s initially on room air for which patient was placed on 4 L nasal cannula oxygen.  Denies having any significant cough.  CTA of the chest did not note any signs of a PE, but did note a chronic groundglass mosaic pattern with patchy densities in both lungs.  Previously questioned to have a chronic interstitial organizing pneumonia during hospitalization in 2017.  -Admit to medical telemetry bed -Continuous pulse oximetry with  nasal cannula oxygen as needed. -F/u Covid test  -Check procalcitonin -Solu-Medrol 60 mg IV every 12 hours -Albuterol inhaler QID -Azithromycin IV -Transition to nebulizer once COVID-19 screening negative. -Evaluate for need of home oxygen prior to discharge  Leukocytosis: Acute.  WBC elevated 11.2.  Suspect this could be reactive in nature.  Patient denies having any fever or chills.   -Recheck CBC in a.m.  Hypertensive urgency: On admission blood pressures elevated up to 188/119. -Continue amlodipine and ibesartan -Labetalol IV as needed  Diastolic congestive heart failure, Pulmonary hypertension: Patient reports 4 pound weight increase.  BNP was mildly increased on 143.8.  Last echocardiogram revealed EF of 65 to 70% back in 02/2016.  CT angiogram did show signs of worsening pulmonary artery hypertension.  -Strict I&Os and daily weights -Give 1 dose of Lasix 40 mg IV -Will likely need further work-up of pulmonary hypertension  Hypothyroidism: No recent TSH available. -Check TSH -Continue levothyroxine  Diabetes mellitus type 2: Patient previously well controlled on Metformin.  Last available hemoglobin A1c 6.4 on 09/07/2016. -Hypoglycemic protocols  -Check hemoglobin A1c -CBGs before every meal with moderate SSI  Bipolar disorder -Continue Depakote and Lamictal  Will need to complete  med reconciliation  DVT prophylaxis: Lovenox   Code Status: Full  Family Communication: No family present at bedside Disposition Plan: Possible discharge home in 1 to 2 days Consults called: None Admission status: observation  Norval Morton MD [facility] Pager [phone]   If 7PM-7AM, please contact night-coverage [email or web address] Password [email or web address]  10/14/2019, 2:50 PM

## 2019-10-14 NOTE — ED Notes (Signed)
ED TO INPATIENT HANDOFF REPORT  ED Nurse Name and Phone #: Gwyndolyn Saxon [phone]  S Name/Age/Gender [name] 48 y.o. female Room/Bed: 053C/053C  Code Status   Code Status: Full Code  Home/SNF/Other Home Patient oriented to: self, place, time and situation Is this baseline? Yes   Triage Complete: Triage complete  Chief Complaint SHOB from [facility]  Triage Note Transferred by EMS from [facility] with SOB worse with exertion onset last week , denies chest pain / no fever or chills . O2 4lpm/Graf applied by EMS .    Allergies Allergies  Allergen Reactions  . Abilify [Aripiprazole] Other (See Comments)    Reaction:  Hyperglycemia    Level of Care/Admitting Diagnosis ED Disposition    ED Disposition Condition Comment   Admit  Hospital Area: [facility] [[id number]]  Level of Care: Telemetry Medical [104]  I expect the patient will be discharged within 24 hours: No (not a candidate for 5C-Observation unit)  Covid Evaluation: Asymptomatic Screening Protocol (No Symptoms)  Diagnosis: Acute respiratory failure with hypoxia [id number]  Admitting Physician: Norval Morton [id number]  Attending Physician: Norval Morton [id number]  PT Class (Do Not Modify): Observation [104]  PT Acc Code (Do Not Modify): Observation [10022]       B Medical/Surgery History Past Medical History:  Diagnosis Date  . Anemia microcytic 03/31/2013  . Bipolar disorder ([place]) 03/31/2013  . CHF (congestive heart failure) ([place])   . Diabetes mellitus without complication ([place])   . Hyperlipidemia 03/31/2013  . Hypertension   . Obesity hypoventilation syndrome ([place])   . Plantar fasciitis of left foot   . Pneumonia   . Pneumothorax   . Pulmonary infiltrates   . Rhabdomyolysis 03/31/2013   [place] secondary to abilify  . Vitamin D deficiency 03/31/2013   Past Surgical History:  Procedure Laterality Date  . CESAREAN SECTION     x2  . CYST EXCISION    . DILATATION &  CURETTAGE/HYSTEROSCOPY WITH MYOSURE N/A 02/17/2016   Procedure: DILATATION & CURETTAGE/HYSTEROSCOPY/Myomectomy WITH MYOSURE;  Surgeon: Aloha Gell, MD;  Location: [facility];  Service: Gynecology;  Laterality: N/A;  . IR ANGIOGRAM PELVIS SELECTIVE OR SUPRASELECTIVE  03/29/2017  . IR ANGIOGRAM PELVIS SELECTIVE OR SUPRASELECTIVE  03/29/2017  . IR ANGIOGRAM SELECTIVE EACH ADDITIONAL VESSEL  03/29/2017  . IR ANGIOGRAM SELECTIVE EACH ADDITIONAL VESSEL  03/29/2017  . IR EMBO TUMOR ORGAN ISCHEMIA INFARCT INC GUIDE ROADMAPPING  03/29/2017  . IR GENERIC HISTORICAL  11/14/2016   IR RADIOLOGIST EVAL & MGMT 11/14/2016 Greggory Keen, MD [facility] INTERV RAD  . IR RADIOLOGIST EVAL & MGMT  05/16/2017  . IR US GUIDE VASC ACCESS RIGHT  03/29/2017  . MYOMECTOMY N/A 02/17/2016   Procedure: Amado Coe WITH Jacklynn Barnacle;  Surgeon: Aloha Gell, MD;  Location: [facility];  Service: Gynecology;  Laterality: N/A;  . VENTRICULOPERITONEAL SHUNT       A IV Location/Drains/Wounds Patient Lines/Drains/Airways Status   Active Line/Drains/Airways    Name:   Placement date:   Placement time:   Site:   Days:   Peripheral IV 10/14/19 Right Antecubital   10/14/19    1030    Antecubital   less than 1   Incision (Closed) 02/17/16 Vagina   02/17/16    1011     1335          Intake/Output Last 24 hours  Intake/Output Summary (Last 24 hours) at 10/14/2019 2247 Last data filed at 10/14/2019 2242 Gross per 24 hour  Intake 256 ml  Output -  Net 256 ml    Labs/Imaging Results for orders placed or performed during the hospital encounter of 10/13/19 (from the past 48 hour(s))  Brain natriuretic peptide     Status: Abnormal   Collection Time: 10/13/19  9:40 PM  Result Value Ref Range   B Natriuretic Peptide 143.8 (H) 0.0 - 100.0 pg/mL    Comment: Performed at [facility], [address], [place], [place] [address]  CBC with Differential     Status: Abnormal   Collection Time: 10/13/19  9:40 PM  Result Value Ref Range   WBC 11.2  (H) 4.0 - 10.5 K/uL   RBC 4.97 3.87 - 5.11 MIL/uL   Hemoglobin 13.9 12.0 - 15.0 g/dL   HCT 46.6 (H) 36.0 - 46.0 %   MCV 93.8 80.0 - 100.0 fL   MCH 28.0 26.0 - 34.0 pg   MCHC 29.8 (L) 30.0 - 36.0 g/dL   RDW 13.7 11.5 - 15.5 %   Platelets 362 150 - 400 K/uL   nRBC 0.4 (H) 0.0 - 0.2 %   Neutrophils Relative % 72 %   Neutro Abs 8.2 (H) 1.7 - 7.7 K/uL   Lymphocytes Relative 15 %   Lymphs Abs 1.7 0.7 - 4.0 K/uL   Monocytes Relative 10 %   Monocytes Absolute 1.1 (H) 0.1 - 1.0 K/uL   Eosinophils Relative 1 %   Eosinophils Absolute 0.1 0.0 - 0.5 K/uL   Basophils Relative 1 %   Basophils Absolute 0.1 0.0 - 0.1 K/uL   Immature Granulocytes 1 %   Abs Immature Granulocytes 0.10 (H) 0.00 - 0.07 K/uL    Comment: Performed at [facility] [address] [place], [place] [address]  Comprehensive metabolic panel     Status: Abnormal   Collection Time: 10/13/19  9:40 PM  Result Value Ref Range   Sodium 140 135 - 145 mmol/L   Potassium 4.0 3.5 - 5.1 mmol/L   Chloride 101 98 - 111 mmol/L   CO2 28 22 - 32 mmol/L   Glucose, Bld 188 (H) 70 - 99 mg/dL   BUN 9 6 - 20 mg/dL   Creatinine, Ser 0.83 0.44 - 1.00 mg/dL   Calcium 8.6 (L) 8.9 - 10.3 mg/dL   Total Protein 6.1 (L) 6.5 - 8.1 g/dL   Albumin 3.1 (L) 3.5 - 5.0 g/dL   AST 25 15 - 41 U/L   ALT 35 0 - 44 U/L   Alkaline Phosphatase 73 38 - 126 U/L   Total Bilirubin 0.7 0.3 - 1.2 mg/dL   GFR calc non Af Amer >60 >60 mL/min   GFR calc Af Amer >60 >60 mL/min   Anion gap 11 5 - 15    Comment: Performed at [facility] [address], [place], [place] [address]  I-Stat Beta hCG blood, ED ([facility], [facility], [facility] only)     Status: None   Collection Time: 10/13/19 10:43 PM  Result Value Ref Range   I-stat hCG, quantitative <5.0 <5 mIU/mL   Comment 3            Comment:   GEST. AGE      CONC.  (mIU/mL)   <=1 WEEK        5 - 50     2 WEEKS       50 - 500     3 WEEKS       100 - 10,000     4 WEEKS  1,000 - 30,000        FEMALE AND NON-PREGNANT  FEMALE:     LESS THAN 5 mIU/mL   SARS CORONAVIRUS 2 (TAT 6-24 HRS) Nasopharyngeal Nasopharyngeal Swab     Status: None   Collection Time: 10/14/19  1:52 PM   Specimen: Nasopharyngeal Swab  Result Value Ref Range   SARS Coronavirus 2 NEGATIVE NEGATIVE    Comment: (NOTE) SARS-CoV-2 target nucleic acids are NOT DETECTED. The SARS-CoV-2 RNA is generally detectable in upper and lower respiratory specimens during the acute phase of infection. Negative results do not preclude SARS-CoV-2 infection, do not rule out co-infections with other pathogens, and should not be used as the sole basis for treatment or other patient management decisions. Negative results must be combined with clinical observations, patient history, and epidemiological information. The expected result is Negative. Fact Sheet for Patients: SugarRoll.be Fact Sheet for Healthcare Providers: https://www.woods-mathews.com/ This test is not yet approved or cleared by the [place] FDA and  has been authorized for detection and/or diagnosis of SARS-CoV-2 by FDA under an Emergency Use Authorization (EUA). This EUA will remain  in effect (meaning this test can be used) for the duration of the COVID-19 declaration under Section 56 4(b)(1) of the Act, 21 U.S.C. section 360bbb-3(b)(1), unless the authorization is terminated or revoked sooner. Performed at [facility], [place] [address], [place], [place] [address]   HIV Antibody (routine testing w rflx)     Status: None   Collection Time: 10/14/19  3:24 PM  Result Value Ref Range   HIV Screen 4th Generation wRfx NON REACTIVE NON REACTIVE    Comment: Performed at [facility], [place] [address], [place], [place] [address]  CBG monitoring, ED     Status: Abnormal   Collection Time: 10/14/19  6:27 PM  Result Value Ref Range   Glucose-Capillary 281 (H) 70 - 99 mg/dL  Hemoglobin A1c     Status: Abnormal   Collection Time:  10/14/19  9:11 PM  Result Value Ref Range   Hgb A1c MFr Bld 8.8 (H) 4.8 - 5.6 %    Comment: (NOTE) Pre diabetes:          5.7%-6.4% Diabetes:              >6.4% Glycemic control for   <7.0% adults with diabetes    Mean Plasma Glucose 205.86 mg/dL    Comment: Performed at [facility], [place] [address], [place], [place] [address]  TSH     Status: None   Collection Time: 10/14/19  9:11 PM  Result Value Ref Range   TSH 1.710 0.350 - 4.500 uIU/mL    Comment: Performed by a 3rd Generation assay with a functional sensitivity of <=0.01 uIU/mL. Performed at [facility], [place] [address], [place], [place] [address]   Procalcitonin - Baseline     Status: None   Collection Time: 10/14/19  9:11 PM  Result Value Ref Range   Procalcitonin <0.10 ng/mL    Comment:        Interpretation: PCT (Procalcitonin) <= 0.5 ng/mL: Systemic infection (sepsis) is not likely. Local bacterial infection is possible. (NOTE)       Sepsis PCT Algorithm           Lower Respiratory Tract  Infection PCT Algorithm    ----------------------------     ----------------------------         PCT < 0.25 ng/mL                PCT < 0.10 ng/mL         Strongly encourage             Strongly discourage   discontinuation of antibiotics    initiation of antibiotics    ----------------------------     -----------------------------       PCT 0.25 - 0.50 ng/mL            PCT 0.10 - 0.25 ng/mL               OR       >80% decrease in PCT            Discourage initiation of                                            antibiotics      Encourage discontinuation           of antibiotics    ----------------------------     -----------------------------         PCT >= 0.50 ng/mL              PCT 0.26 - 0.50 ng/mL               AND        <80% decrease in PCT             Encourage initiation of                                             antibiotics       Encourage continuation            of antibiotics    ----------------------------     -----------------------------        PCT >= 0.50 ng/mL                  PCT > 0.50 ng/mL               AND         increase in PCT                  Strongly encourage                                      initiation of antibiotics    Strongly encourage escalation           of antibiotics                                     -----------------------------                                           PCT <= 0.25 ng/mL  OR                                        > 80% decrease in PCT                                     Discontinue / Do not initiate                                             antibiotics Performed at [facility], [place] [address], [place], [place] [address]    Dg Chest 2 View  Result Date: 10/13/2019 CLINICAL DATA:  Wheezing sensation and shortness of breath for 1 week, fever, oxygen desaturation, history CHF, diabetes mellitus, hypertension EXAM: CHEST - 2 VIEW COMPARISON:  11/23/2016 FINDINGS: VP shunt tubing traverses RIGHT hemithorax. Enlargement of cardiac silhouette. Mediastinal contours and pulmonary vascularity normal. Mild bronchitic changes with bibasilar atelectasis. Lungs otherwise clear. Minimal chronic thickening at minor fissure. No definite acute infiltrate, pleural effusion, or pneumothorax. RIGHT upper lobe nodular density 10 x 9 mm. Questionable LEFT perihilar nodular density versus prominent vascular marking. IMPRESSION: Enlargement of cardiac silhouette. Bronchitic changes with bibasilar atelectasis. RIGHT upper lobe nodular density 10 x 9 mm with questionable LEFT perihilar nodular density versus prominent vascular marking; further evaluation by CT imaging of the chest recommended, with contrast if patient's renal function permits. Electronically Signed   By: Lavonia Dana M.D.   On: 10/13/2019 22:04   Ct Angio Chest Pe W/cm &/or Wo Cm  Result Date:  10/14/2019 CLINICAL DATA:  Shortness of breath. EXAM: CT ANGIOGRAPHY CHEST WITH CONTRAST TECHNIQUE: Multidetector CT imaging of the chest was performed using the standard protocol during bolus administration of intravenous contrast. Multiplanar CT image reconstructions and MIPs were obtained to evaluate the vascular anatomy. CONTRAST:  8mL OMNIPAQUE IOHEXOL 350 MG/ML SOLN COMPARISON:  Chest x-ray dated 10/13/2019 and CT angiogram of the chest dated 09/06/2016 FINDINGS: Cardiovascular: No pulmonary emboli. There is mild cardiomegaly. There is chronic dilatation of the pulmonary arteries, increased to 3.6 mm of the main pulmonary artery from 3.2 mm on the prior study. No pericardial effusion. No coronary artery calcifications or aortic atherosclerosis. Mediastinum/Nodes: No enlarged mediastinal, hilar, or axillary lymph nodes. Thyroid gland, trachea, and esophagus demonstrate no significant findings. Lungs/Pleura: Patient has a chronic ground-glass mosaic pattern of patchy densities in both lungs, similar to that seen on the prior study. New area of atelectasis in the anterior aspect of the right lower lobe. Areas of atelectasis or posteriorly in the right lower lobe on the prior study have resolved. There is bilateral chronic peribronchial thickening. Upper Abdomen: Chronic hepatomegaly. No acute abnormalities. Musculoskeletal: No chest wall abnormality. No acute or significant osseous findings. Review of the MIP images confirms the above findings. IMPRESSION: 1. No pulmonary emboli. 2. Progressive enlargement of the pulmonary arteries consistent with pulmonary arterial hypertension. 3. Chronic ground-glass mosaic pattern of patchy densities in both lungs, similar to that seen on the prior study, likely related to the patient's pulmonary arterial hypertension. 4. New area of atelectasis in the anterior aspect of the right lower lobe. 5. Chronic bilateral peribronchial thickening. 6. Chronic hepatomegaly. Aortic  Atherosclerosis (ICD10-I70.0). Electronically Signed  By: Lorriane Shire M.D.   On: 10/14/2019 11:04    Pending Labs Unresulted Labs (From admission, onward)    Start     Ordered   10/15/19 0500  CBC  Tomorrow morning,   R     10/14/19 1525   10/15/19 [id number]  Basic metabolic panel  Tomorrow morning,   R     10/14/19 1525   10/15/19 0500  Procalcitonin  Daily,   R     10/14/19 1816          Vitals/Pain Today's Vitals   10/14/19 2046 10/14/19 2130 10/14/19 2200 10/14/19 2230  BP:  (!) 150/99 (!) 152/111 (!) 146/111  Pulse:  89 87 91  Resp:  (!) 26 19 19   Temp:      TempSrc:      SpO2:  98% 97% 97%  Weight:      Height:      PainSc: 0-No pain       Isolation Precautions No active isolations  Medications Medications  enoxaparin (LOVENOX) injection 40 mg (40 mg Subcutaneous Given 10/14/19 1539)  sodium chloride flush (NS) 0.9 % injection 3 mL (3 mLs Intravenous Given 10/14/19 2133)  acetaminophen (TYLENOL) tablet 650 mg (has no administration in time range)    Or  acetaminophen (TYLENOL) suppository 650 mg (has no administration in time range)  ondansetron (ZOFRAN) tablet 4 mg (has no administration in time range)    Or  ondansetron (ZOFRAN) injection 4 mg (has no administration in time range)  guaiFENesin (MUCINEX) 12 hr tablet 600 mg (600 mg Oral Given 10/14/19 2127)  albuterol (VENTOLIN HFA) 108 (90 Base) MCG/ACT inhaler 2 puff (2 puffs Inhalation Given 10/14/19 2128)  insulin aspart (novoLOG) injection 0-15 Units (has no administration in time range)  methylPREDNISolone sodium succinate (SOLU-MEDROL) 125 mg/2 mL injection 60 mg (60 mg Intravenous Given 10/14/19 2128)  amLODipine (NORVASC) tablet 10 mg (10 mg Oral Given 10/14/19 2127)  irbesartan (AVAPRO) tablet 300 mg (300 mg Oral Given 10/14/19 2127)  levothyroxine (SYNTHROID) tablet 50 mcg (has no administration in time range)  lamoTRIgine (LAMICTAL) tablet 100 mg (100 mg Oral Given 10/14/19 2127)  divalproex  (DEPAKOTE ER) 24 hr tablet 500 mg (500 mg Oral Given 10/14/19 2128)  labetalol (NORMODYNE) injection 5 mg (has no administration in time range)  azithromycin (ZITHROMAX) 500 mg in sodium chloride 0.9 % 250 mL IVPB (0 mg Intravenous Stopped 10/14/19 2242)  0.9 %  sodium chloride infusion (250 mLs Intravenous New Bag/Given 10/14/19 2135)  albuterol (VENTOLIN HFA) 108 (90 Base) MCG/ACT inhaler 3 puff (3 puffs Inhalation Given 10/14/19 1035)  methylPREDNISolone sodium succinate (SOLU-MEDROL) 125 mg/2 mL injection 125 mg (125 mg Intravenous Given 10/14/19 1035)  iohexol (OMNIPAQUE) 350 MG/ML injection 100 mL (50 mLs Intravenous Contrast Given 10/14/19 1052)  prochlorperazine (COMPAZINE) injection 10 mg (10 mg Intravenous Given 10/14/19 1353)  diphenhydrAMINE (BENADRYL) injection 25 mg (25 mg Intravenous Given 10/14/19 1353)  furosemide (LASIX) injection 40 mg (40 mg Intravenous Given 10/14/19 1851)    Mobility walks Low fall risk   Focused Assessments Cardiac Assessment Handoff:  Cardiac Rhythm: Normal sinus rhythm Lab Results  Component Value Date   TROPONINI <0.30 04/01/2013   Lab Results  Component Value Date   DDIMER 2.53 (H) 09/06/2016   Does the Patient currently have chest pain? No     R Recommendations: See Admitting Provider Note  Report given to:   Additional Notes:  SOB with exertion; patient has been using BSC to limit )  2 drop while ambulating-Monique,RN

## 2019-10-14 NOTE — ED Provider Notes (Addendum)
[facility] EMERGENCY DEPARTMENT Provider Note   CSN: [id number] Arrival date & time: 10/13/19  2050     History   Chief Complaint Chief Complaint  Patient presents with   Shortness of Breath    HPI [name] is a 48 y.o. female with past medical history of CHF, COPD presents to ED for evaluation of 3-day history of wheezing, shortness of breath and dyspnea on exertion.  She is concerned that her oxygen levels are low.  She has been told in the past that she needed supplemental oxygen and try to use her machine at home but states that it did not work and the company was out of service.  She is also noncompliant with her Lasix.  She does not use her inhalers at home.  States that this feels similar to the prior to admission she has had for hypoxia due to COPD exacerbation.  Feels short of breath even with walking short distances at home.  She denies any leg swelling, chest pain, fever, cough, hemoptysis, history of DVT, PE or MI. Patient seen at urgent care prior to arrival in the ED yesterday, 4 L of oxygen via nasal cannula applied due to oxygen saturations in 70s.     HPI  Past Medical History:  Diagnosis Date   Anemia microcytic 03/31/2013   Bipolar disorder ([place]) 03/31/2013   CHF (congestive heart failure) (HCC)    Diabetes mellitus without complication (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)    Type 2 diabetes mellitus with complication (HCC)    Essential hypertension    Affective psychosis, bipolar (HCC)    Uterine leiomyoma    Acute respiratory failure ([place]) 02/17/2016   Hypoxemic respiratory failure, chronic (HCC) 0000000   Diastolic CHF, chronic (HCC) 04/02/2013   Obesity hypoventilation syndrome ([place]) 04/02/2013   Abnormal EKG 03/31/2013   HTN (hypertension) 03/31/2013   Hyperlipidemia 03/31/2013   Diabetes ([place]) 03/31/2013   Vitamin D deficiency 03/31/2013   Anemia 03/31/2013   Bipolar I disorder, most recent episode (or current) manic ([place]) 03/17/2013    Past Surgical History:  Procedure Laterality Date   CESAREAN SECTION     x2   CYST EXCISION     DILATATION & CURETTAGE/HYSTEROSCOPY WITH MYOSURE N/A 02/17/2016   Procedure: DILATATION & CURETTAGE/HYSTEROSCOPY/Myomectomy WITH MYOSURE;  Surgeon: Aloha Gell, MD;  Location: [facility];  Service: Gynecology;  Laterality: N/A;   IR ANGIOGRAM PELVIS SELECTIVE OR SUPRASELECTIVE  03/29/2017   IR ANGIOGRAM PELVIS SELECTIVE OR SUPRASELECTIVE  03/29/2017   IR ANGIOGRAM SELECTIVE EACH ADDITIONAL VESSEL  03/29/2017   IR ANGIOGRAM SELECTIVE EACH ADDITIONAL VESSEL  03/29/2017   IR EMBO TUMOR ORGAN ISCHEMIA INFARCT INC GUIDE ROADMAPPING  03/29/2017   IR GENERIC HISTORICAL  11/14/2016   IR RADIOLOGIST EVAL & MGMT 11/14/2016 Greggory Keen, MD [facility] INTERV RAD   IR RADIOLOGIST EVAL & MGMT  05/16/2017   IR US GUIDE VASC ACCESS RIGHT  03/29/2017   MYOMECTOMY N/A 02/17/2016   Procedure: Amado Coe WITH Jacklynn Barnacle;  Surgeon: Aloha Gell, MD;  Location: [facility];  Service: Gynecology;  Laterality: N/A;   VENTRICULOPERITONEAL SHUNT       OB History   No obstetric history on file.      Home Medications    Prior to Admission medications   Medication Sig Start Date End Date  Taking? Authorizing Provider  albuterol (PROVENTIL HFA;VENTOLIN HFA) 108 (90 Base) MCG/ACT inhaler Inhale 2 puffs into the lungs every 6 (six) hours as needed for wheezing or shortness of breath. 09/21/16   Magdalen Spatz, NP  amLODipine (NORVASC) 5 MG tablet Take 10 mg by mouth daily.     [provider]  diclofenac sodium (VOLTAREN) 1 % GEL Apply 2 g topically 4 (four) times daily as needed (for pain). Pt applies to legs.    [provider]  divalproex (DEPAKOTE ER) 500 MG 24 hr tablet Take 500 mg by mouth daily.     [provider]  docusate sodium (COLACE) 100 MG capsule Take 1 capsule (100 mg total) by mouth 2 (two) times daily. 03/30/17   Ascencion Dike, PA-C  FeFum-FePoly-FA-B Cmp-C-Biot (INTEGRA PLUS) CAPS Take 1 capsule by mouth every evening.     [provider]  furosemide (LASIX) 40 MG tablet Take 1 tablet (40 mg total) by mouth daily. Patient taking differently: Take 40 mg by mouth daily as needed for edema.  09/12/16   Eugenie Filler, MD  ibuprofen (ADVIL,MOTRIN) 800 MG tablet Take 1 tablet (800 mg total) by mouth 3 (three) times daily. 03/30/17   Ascencion Dike, PA-C  irbesartan (AVAPRO) 300 MG tablet Take 300 mg by mouth daily.    [provider]  lamoTRIgine (LAMICTAL) 200 MG tablet Take 100 mg by mouth daily.     [provider]  levothyroxine (SYNTHROID, LEVOTHROID) 50 MCG tablet Take 50 mcg by mouth daily before breakfast.    [provider]  metFORMIN (GLUCOPHAGE) 1000 MG tablet Take 1,000 mg by mouth 2 (two) times daily with a meal.    [provider]  Methylsulfonylmethane (MSM PO) Take 3 tablets by mouth every evening.     [provider]  Multiple Vitamin (MULTIVITAMIN) LIQD Take 30 mLs by mouth daily.     [provider]  oxyCODONE (ROXICODONE) 5 MG immediate release tablet Take 1 tablet (5 mg total) by mouth every 6 (six) hours as needed for severe pain. Patient not taking: Reported  on 05/16/2017 03/30/17   Ascencion Dike, PA-C  promethazine (PHENERGAN) 25 MG tablet Take 0.5 tablets (12.5 mg total) by mouth every 8 (eight) hours as needed for nausea. Patient not taking: Reported on 05/16/2017 03/30/17   Ascencion Dike, PA-C  traMADol (ULTRAM) 50 MG tablet Take 1 tablet (50 mg total) by mouth 2 (two) times daily as needed for severe pain. 11/14/15   Carmin Muskrat, MD    Family History Family History  Problem Relation Age of Onset   Diabetes Mother    Hyperlipidemia Mother    Hypertension Mother    Lung cancer Father    Hypertension Brother     Social History Social History   Tobacco Use   Smoking status: Never Smoker   Smokeless tobacco: Never Used  Substance Use Topics  Alcohol use: No   Drug use: No     Allergies   Abilify [aripiprazole]   Review of Systems Review of Systems  Constitutional: Negative for appetite change, chills and fever.  HENT: Negative for ear pain, rhinorrhea, sneezing and sore throat.   Eyes: Negative for photophobia and visual disturbance.  Respiratory: Positive for shortness of breath. Negative for cough, chest tightness and wheezing.   Cardiovascular: Negative for chest pain and palpitations.  Gastrointestinal: Negative for abdominal pain, blood in stool, constipation, diarrhea, nausea and vomiting.  Genitourinary: Negative for dysuria, hematuria and urgency.  Musculoskeletal: Negative for myalgias.  Skin: Negative for rash.  Neurological: Negative for dizziness, weakness and light-headedness.     Physical Exam Updated Vital Signs BP (!) 164/103 (BP Location: Left Arm)    Pulse 90    Temp 98.8 F (37.1 C) (Oral)    Resp 19    Ht 4\' 9"  (1.448 m)    Wt 81.6 kg    LMP 10/12/2019    SpO2 100%    BMI 38.95 kg/m   Physical Exam Vitals signs and nursing note reviewed.  Constitutional:      General: She is not in acute distress.    Appearance: She is well-developed.     Comments: On 2 L of oxygen by nasal  cannula.  Speaking complete sentences.  HENT:     Head: Normocephalic and atraumatic.     Nose: Nose normal.  Eyes:     General: No scleral icterus.       Left eye: No discharge.     Conjunctiva/sclera: Conjunctivae normal.  Neck:     Musculoskeletal: Normal range of motion and neck supple.  Cardiovascular:     Rate and Rhythm: Normal rate and regular rhythm.     Heart sounds: Normal heart sounds. No murmur. No friction rub. No gallop.   Pulmonary:     Effort: Pulmonary effort is normal. No respiratory distress.     Breath sounds: Examination of the right-upper field reveals decreased breath sounds. Examination of the left-upper field reveals decreased breath sounds. Examination of the right-middle field reveals decreased breath sounds. Examination of the left-middle field reveals decreased breath sounds. Examination of the right-lower field reveals decreased breath sounds. Examination of the left-lower field reveals decreased breath sounds. Decreased breath sounds present.  Abdominal:     General: Bowel sounds are normal. There is no distension.     Palpations: Abdomen is soft.     Tenderness: There is no abdominal tenderness. There is no guarding.  Musculoskeletal: Normal range of motion.  Skin:    General: Skin is warm and dry.     Findings: No rash.  Neurological:     Mental Status: She is alert.     Motor: No abnormal muscle tone.     Coordination: Coordination normal.      ED Treatments / Results  Labs (all labs ordered are listed, but only abnormal results are displayed) Labs Reviewed  BRAIN NATRIURETIC PEPTIDE - Abnormal; Notable for the following components:      Result Value   B Natriuretic Peptide 143.8 (*)    All other components within normal limits  CBC WITH DIFFERENTIAL/PLATELET - Abnormal; Notable for the following components:   WBC 11.2 (*)    HCT 46.6 (*)    MCHC 29.8 (*)    nRBC 0.4 (*)    Neutro Abs 8.2 (*)    Monocytes Absolute 1.1 (*)    Abs  Immature Granulocytes 0.10 (*)  All other components within normal limits  COMPREHENSIVE METABOLIC PANEL - Abnormal; Notable for the following components:   Glucose, Bld 188 (*)    Calcium 8.6 (*)    Total Protein 6.1 (*)    Albumin 3.1 (*)    All other components within normal limits  SARS CORONAVIRUS 2 (TAT 6-24 HRS)  I-STAT BETA HCG BLOOD, ED ([facility], [facility], [facility] ONLY)    EKG EKG Interpretation  Date/Time:  Monday October 13 2019 21:16:14 EDT Ventricular Rate:  82 PR Interval:  142 QRS Duration: 72 QT Interval:  390 QTC Calculation: 455 R Axis:   107 Text Interpretation: Normal sinus rhythm Rightward axis Anterior infarct , age undetermined Abnormal ECG Confirmed by [place] Ferguson [phone]) on 10/14/2019 12:43:09 PM   Radiology Dg Chest 2 View  Result Date: 10/13/2019 CLINICAL DATA:  Wheezing sensation and shortness of breath for 1 week, fever, oxygen desaturation, history CHF, diabetes mellitus, hypertension EXAM: CHEST - 2 VIEW COMPARISON:  11/23/2016 FINDINGS: VP shunt tubing traverses RIGHT hemithorax. Enlargement of cardiac silhouette. Mediastinal contours and pulmonary vascularity normal. Mild bronchitic changes with bibasilar atelectasis. Lungs otherwise clear. Minimal chronic thickening at minor fissure. No definite acute infiltrate, pleural effusion, or pneumothorax. RIGHT upper lobe nodular density 10 x 9 mm. Questionable LEFT perihilar nodular density versus prominent vascular marking. IMPRESSION: Enlargement of cardiac silhouette. Bronchitic changes with bibasilar atelectasis. RIGHT upper lobe nodular density 10 x 9 mm with questionable LEFT perihilar nodular density versus prominent vascular marking; further evaluation by CT imaging of the chest recommended, with contrast if patient's renal function permits. Electronically Signed   By: Lavonia Dana M.D.   On: 10/13/2019 22:04   Ct Angio Chest Pe W/cm &/or Wo Cm  Result Date: 10/14/2019 CLINICAL DATA:  Shortness of breath.  EXAM: CT ANGIOGRAPHY CHEST WITH CONTRAST TECHNIQUE: Multidetector CT imaging of the chest was performed using the standard protocol during bolus administration of intravenous contrast. Multiplanar CT image reconstructions and MIPs were obtained to evaluate the vascular anatomy. CONTRAST:  17mL OMNIPAQUE IOHEXOL 350 MG/ML SOLN COMPARISON:  Chest x-ray dated 10/13/2019 and CT angiogram of the chest dated 09/06/2016 FINDINGS: Cardiovascular: No pulmonary emboli. There is mild cardiomegaly. There is chronic dilatation of the pulmonary arteries, increased to 3.6 mm of the main pulmonary artery from 3.2 mm on the prior study. No pericardial effusion. No coronary artery calcifications or aortic atherosclerosis. Mediastinum/Nodes: No enlarged mediastinal, hilar, or axillary lymph nodes. Thyroid gland, trachea, and esophagus demonstrate no significant findings. Lungs/Pleura: Patient has a chronic ground-glass mosaic pattern of patchy densities in both lungs, similar to that seen on the prior study. New area of atelectasis in the anterior aspect of the right lower lobe. Areas of atelectasis or posteriorly in the right lower lobe on the prior study have resolved. There is bilateral chronic peribronchial thickening. Upper Abdomen: Chronic hepatomegaly. No acute abnormalities. Musculoskeletal: No chest wall abnormality. No acute or significant osseous findings. Review of the MIP images confirms the above findings. IMPRESSION: 1. No pulmonary emboli. 2. Progressive enlargement of the pulmonary arteries consistent with pulmonary arterial hypertension. 3. Chronic ground-glass mosaic pattern of patchy densities in both lungs, similar to that seen on the prior study, likely related to the patient's pulmonary arterial hypertension. 4. New area of atelectasis in the anterior aspect of the right lower lobe. 5. Chronic bilateral peribronchial thickening. 6. Chronic hepatomegaly. Aortic Atherosclerosis (ICD10-I70.0). Electronically Signed    By: Lorriane Shire M.D.   On: 10/14/2019 11:04    Procedures .Critical  Care Performed by: Delia Heady, PA-C Authorized by: Delia Heady, PA-C   Critical care provider statement:    Critical care time (minutes):  35   Critical care time was exclusive of:  Separately billable procedures and treating other patients   Critical care was necessary to treat or prevent imminent or life-threatening deterioration of the following conditions:  Respiratory failure, cardiac failure, sepsis and circulatory failure   Critical care was time spent personally by me on the following activities:  Development of treatment plan with patient or surrogate, discussions with consultants, evaluation of patient's response to treatment, ordering and review of laboratory studies, ordering and review of radiographic studies, pulse oximetry, re-evaluation of patient's condition, review of old charts and obtaining history from patient or surrogate   I assumed direction of critical care for this patient from another provider in my specialty: no     (including critical care time)  Medications Ordered in ED Medications  prochlorperazine (COMPAZINE) injection 10 mg (has no administration in time range)  diphenhydrAMINE (BENADRYL) injection 25 mg (has no administration in time range)  albuterol (VENTOLIN HFA) 108 (90 Base) MCG/ACT inhaler 3 puff (3 puffs Inhalation Given 10/14/19 1035)  methylPREDNISolone sodium succinate (SOLU-MEDROL) 125 mg/2 mL injection 125 mg (125 mg Intravenous Given 10/14/19 1035)  iohexol (OMNIPAQUE) 350 MG/ML injection 100 mL (50 mLs Intravenous Contrast Given 10/14/19 1052)     Initial Impression / Assessment and Plan / ED Course  I have reviewed the triage vital signs and the nursing notes.  Pertinent labs & imaging results that were available during my care of the patient were reviewed by me and considered in my medical decision making (see chart for details).        48 year old female  with past medical history of CHF, COPD presenting to the ED for shortness of breath, wheezing for the past 3 days.  She admits to being noncompliant with her supplemental oxygen, Lasix and inhalers.  States that this is happened to her in the past due to her COPD.  She was concerned that she was hypoxic at home as she was dyspneic with even walking short distances.  Patient presented to urgent care with oxygen saturations in 70% on room air.  Here she was placed on 2 L of oxygen via nasal cannula with significant improvement.  Given albuterol and Solu-Medrol.  Chest imaging shows changes consistent with pulmonary arterial hypertension and other chronic findings.  CBC is unremarkable.  BNP slightly elevated to 140s.  CMP unremarkable.  Covid test is pending.  Patient ambulated without supplemental oxygen unfortunately desatted to 85%.  She is placed back on 2 days of oxygen via nasal cannula.  She will need to be admitted for ongoing management.  Final Clinical Impressions(s) / ED Diagnoses   Final diagnoses:  SOB (shortness of breath)  Hypoxia  COPD exacerbation [facility])    ED Discharge Orders    None      Portions of this note were generated with Dragon dictation software. Dictation errors may occur despite best attempts at proofreading.      Delia Heady, PA-C 10/15/19 0703    Drenda Freeze, MD 10/15/19 [phone]

## 2019-10-15 DIAGNOSIS — Z9114 Patient's other noncompliance with medication regimen: Secondary | ICD-10-CM | POA: Diagnosis not present

## 2019-10-15 DIAGNOSIS — Z801 Family history of malignant neoplasm of trachea, bronchus and lung: Secondary | ICD-10-CM | POA: Diagnosis not present

## 2019-10-15 DIAGNOSIS — I10 Essential (primary) hypertension: Secondary | ICD-10-CM | POA: Diagnosis not present

## 2019-10-15 DIAGNOSIS — I5032 Chronic diastolic (congestive) heart failure: Secondary | ICD-10-CM | POA: Diagnosis present

## 2019-10-15 DIAGNOSIS — Z833 Family history of diabetes mellitus: Secondary | ICD-10-CM | POA: Diagnosis not present

## 2019-10-15 DIAGNOSIS — Z7984 Long term (current) use of oral hypoglycemic drugs: Secondary | ICD-10-CM | POA: Diagnosis not present

## 2019-10-15 DIAGNOSIS — I110 Hypertensive heart disease with heart failure: Secondary | ICD-10-CM | POA: Diagnosis present

## 2019-10-15 DIAGNOSIS — Z79890 Hormone replacement therapy (postmenopausal): Secondary | ICD-10-CM | POA: Diagnosis not present

## 2019-10-15 DIAGNOSIS — E662 Morbid (severe) obesity with alveolar hypoventilation: Secondary | ICD-10-CM | POA: Diagnosis present

## 2019-10-15 DIAGNOSIS — I340 Nonrheumatic mitral (valve) insufficiency: Secondary | ICD-10-CM | POA: Diagnosis not present

## 2019-10-15 DIAGNOSIS — J209 Acute bronchitis, unspecified: Secondary | ICD-10-CM | POA: Diagnosis present

## 2019-10-15 DIAGNOSIS — F319 Bipolar disorder, unspecified: Secondary | ICD-10-CM | POA: Diagnosis present

## 2019-10-15 DIAGNOSIS — J441 Chronic obstructive pulmonary disease with (acute) exacerbation: Secondary | ICD-10-CM | POA: Diagnosis present

## 2019-10-15 DIAGNOSIS — Z8349 Family history of other endocrine, nutritional and metabolic diseases: Secondary | ICD-10-CM | POA: Diagnosis not present

## 2019-10-15 DIAGNOSIS — J9601 Acute respiratory failure with hypoxia: Secondary | ICD-10-CM | POA: Diagnosis present

## 2019-10-15 DIAGNOSIS — Z20828 Contact with and (suspected) exposure to other viral communicable diseases: Secondary | ICD-10-CM | POA: Diagnosis present

## 2019-10-15 DIAGNOSIS — Z79899 Other long term (current) drug therapy: Secondary | ICD-10-CM | POA: Diagnosis not present

## 2019-10-15 DIAGNOSIS — I160 Hypertensive urgency: Secondary | ICD-10-CM | POA: Diagnosis present

## 2019-10-15 DIAGNOSIS — Z6838 Body mass index (BMI) 38.0-38.9, adult: Secondary | ICD-10-CM | POA: Diagnosis not present

## 2019-10-15 DIAGNOSIS — Z791 Long term (current) use of non-steroidal anti-inflammatories (NSAID): Secondary | ICD-10-CM | POA: Diagnosis not present

## 2019-10-15 DIAGNOSIS — E039 Hypothyroidism, unspecified: Secondary | ICD-10-CM | POA: Diagnosis present

## 2019-10-15 DIAGNOSIS — E785 Hyperlipidemia, unspecified: Secondary | ICD-10-CM | POA: Diagnosis present

## 2019-10-15 DIAGNOSIS — I2721 Secondary pulmonary arterial hypertension: Secondary | ICD-10-CM | POA: Diagnosis present

## 2019-10-15 DIAGNOSIS — Z8249 Family history of ischemic heart disease and other diseases of the circulatory system: Secondary | ICD-10-CM | POA: Diagnosis not present

## 2019-10-15 DIAGNOSIS — F3170 Bipolar disorder, currently in remission, most recent episode unspecified: Secondary | ICD-10-CM | POA: Diagnosis not present

## 2019-10-15 DIAGNOSIS — Z79891 Long term (current) use of opiate analgesic: Secondary | ICD-10-CM | POA: Diagnosis not present

## 2019-10-15 DIAGNOSIS — R0602 Shortness of breath: Secondary | ICD-10-CM | POA: Diagnosis present

## 2019-10-15 DIAGNOSIS — E119 Type 2 diabetes mellitus without complications: Secondary | ICD-10-CM | POA: Diagnosis present

## 2019-10-15 LAB — BASIC METABOLIC PANEL
Anion gap: 9 (ref 5–15)
BUN: 11 mg/dL (ref 6–20)
CO2: 32 mmol/L (ref 22–32)
Calcium: 9 mg/dL (ref 8.9–10.3)
Chloride: 98 mmol/L (ref 98–111)
Creatinine, Ser: 0.71 mg/dL (ref 0.44–1.00)
GFR calc Af Amer: >60 mL/min (ref >60)
GFR calc non Af Amer: >60 mL/min (ref >60)
Glucose, Bld: 279 mg/dL — ABNORMAL HIGH (ref 70–99)
Potassium: 5.1 mmol/L (ref 3.5–5.1)
Sodium: 139 mmol/L (ref 135–145)

## 2019-10-15 LAB — CBC
HCT: 48.6 % — ABNORMAL HIGH (ref 36.0–46.0)
Hemoglobin: 13.9 g/dL (ref 12.0–15.0)
MCH: 27.1 pg (ref 26.0–34.0)
MCHC: 28.6 g/dL — ABNORMAL LOW (ref 30.0–36.0)
MCV: 94.9 fL (ref 80.0–100.0)
Platelets: 395 10*3/uL (ref 150–400)
RBC: 5.12 MIL/uL — ABNORMAL HIGH (ref 3.87–5.11)
RDW: 13.6 % (ref 11.5–15.5)
WBC: 11.2 10*3/uL — ABNORMAL HIGH (ref 4.0–10.5)
nRBC: 0.3 % — ABNORMAL HIGH (ref 0.0–0.2)

## 2019-10-15 LAB — GLUCOSE, CAPILLARY
Glucose-Capillary: 163 mg/dL — ABNORMAL HIGH (ref 70–99)
Glucose-Capillary: 190 mg/dL — ABNORMAL HIGH (ref 70–99)
Glucose-Capillary: 276 mg/dL — ABNORMAL HIGH (ref 70–99)
Glucose-Capillary: 305 mg/dL — ABNORMAL HIGH (ref 70–99)
Glucose-Capillary: 326 mg/dL — ABNORMAL HIGH (ref 70–99)

## 2019-10-15 LAB — PROCALCITONIN: Procalcitonin: <0.1 ng/mL

## 2019-10-15 MED ORDER — IPRATROPIUM-ALBUTEROL 0.5-2.5 (3) MG/3ML IN SOLN
3.0000 mL | Freq: Four times a day (QID) | RESPIRATORY_TRACT | Status: DC
  Administered 2019-10-15 (×2): 3 mL via RESPIRATORY_TRACT
  Filled 2019-10-15 (×2): qty 3

## 2019-10-15 MED ORDER — INSULIN ASPART 100 UNIT/ML ~~LOC~~ SOLN
5.0000 [IU] | Freq: Once | SUBCUTANEOUS | Status: AC
  Administered 2019-10-15: 5 [IU] via SUBCUTANEOUS

## 2019-10-15 MED ORDER — FUROSEMIDE 40 MG PO TABS: 40.0000 mg | ORAL_TABLET | Freq: Every day | ORAL | Status: DC | PRN

## 2019-10-15 MED ORDER — LIVING WELL WITH DIABETES BOOK
Freq: Once | Status: AC
  Administered 2019-10-15: 14:00:00
  Filled 2019-10-15: qty 1

## 2019-10-15 MED ORDER — ALBUTEROL SULFATE (2.5 MG/3ML) 0.083% IN NEBU: 2.5000 mg | INHALATION_SOLUTION | Freq: Three times a day (TID) | RESPIRATORY_TRACT | Status: DC

## 2019-10-15 NOTE — Progress Notes (Signed)
SATURATION QUALIFICATIONS: (This note is used to comply with regulatory documentation for home oxygen)  Patient Saturations on Room Air at Rest = 79%  Patient Saturations on Room Air while Ambulating = 64%  Patient Saturations on  0 Liters of oxygen while Ambulating = 0%  Please briefly explain why patient needs home oxygen:   Sats RA on bed --83% Sats RA at EOB--79% Sats RA after sitting EOB a while --84-86% Sats RA as low as 64% walking,  Back to room, O2 applied 64% on 1L,  Up to 92% on 2L.

## 2019-10-15 NOTE — Progress Notes (Signed)
Inpatient Diabetes Program Recommendations  AACE/ADA: New Consensus Statement on Inpatient Glycemic Control (2015)  Target Ranges:  Prepandial:   less than 140 mg/dL      Peak postprandial:   less than 180 mg/dL (1-2 hours)      Critically ill patients:  140 - 180 mg/dL   Lab Results  Component Value Date   GLUCAP 190 (H) 10/15/2019   HGBA1C 8.8 (H) 10/14/2019    Review of Glycemic Control Results for [name], [name] (MRN [id number]) as of 10/15/2019 10:08  Ref. Range 10/14/2019 18:27 10/15/2019 00:16 10/15/2019 08:16  Glucose-Capillary Latest Ref Range: 70 - 99 mg/dL 281 (H) 305 (H) 190 (H)   Diabetes history: DM2 Outpatient Diabetes medications: Metformin 1 gm bid + Trulicity weekly Current orders for Inpatient glycemic control: Novolog moderate correction tid + Solumedrol 60 mg bid  Inpatient Diabetes Program Recommendations:   -Add Novolog hs correction 0-5 units Spoke with patient to clarify medications and patient is taking Trulicity along with Metformin for her diabetes. Patient checks CBGs and notes that postprandial CBGs are elevated. Patient shares that she drinks some regular pepsi sodas but is willing to change to unsweet tea. Patient's A1c @ PCP office 7.9 in August 2020 and reviewed with her currently increased to 8.8 (average blood glucose 206 over the past 2-3 months). Also reviewed hypoglycemia with patient and ordered Living Well with diabetes for patient review. Will follow during hospitalization.  Thank you, Nani Gasser. Timothy Trudell, RN, MSN, CDE  Diabetes Coordinator Inpatient Glycemic Control Team Team Pager [phone] (8am-5pm) 10/15/2019 10:23 AM

## 2019-10-15 NOTE — Progress Notes (Signed)
PROGRESS NOTE    [name]  [id number] DOB: May 11, 1971 DOA: 10/13/2019 PCP: Glendon Axe, MD   Brief Narrative:   [name] is a 48 y.o. female with medical history significant of HTN, HLD, diastolic CHF, diabetes mellitus type 2, bipolar disorder, obstructive sleep apnea no longer on CPAP,, and obesity.  She presents with 4-5-day history of progressively worsening shortness of breath.  Any kind of exertion made her extremely short of breath.  She reports having associated symptoms of wheezing, but denies having any significant cough.  Associated symptoms include complaints that her weight is up 4 pounds.  Patient was admitted into the hospital back in 2017 for COPD exacerbation and concern for cryptogenic organizing pneumonia based off of groundglass opacities.  Following her discharge from the hospital at that time she had initially been sent home with oxygen, but it was discontinued sometime ago.  Denies having any fever, chills, chest pain, nausea, vomiting, orthopnea, leg pain, leg swelling, or diarrhea symptoms.  She initially went to urgent care, but was sent to the emergency department for further evaluation on 4 L of nasal cannula oxygen.  ED Course: Upon admission to the emergency department patient was noted to be hypoxic into the 70s on room air with improvement after being placed on 4 L nasal cannula oxygen.  Labs significant for WBC 11.2, and BNP 143.8.  CT angiogram of the chest was performed which showed progressive enlargement of pulmonary arteries consistent with pulmonary artery hypertension, atelectasis in the right lower lobe, and chronic ground glass opacities similar to previous studies.  Patient was given 125 mg of Solu-Medrol IV, Benadryl and 3 puffs of albuterol.  TRH called to admit for suspected COPD exacerbation  Assessment & Plan:   Active Problems:   Leukocytosis   Chronic diastolic CHF (congestive heart failure) (HCC)   Type 2 diabetes mellitus with  complication (HCC)   Essential hypertension   COPD with acute exacerbation (HCC)   Bipolar disorder (HCC)   Acute respiratory failure with hypoxia (HCC)   Pulmonary artery hypertension (HCC)   Hypothyroidism   Acute respiratory failure with hypoxia secondary to COPD exacerbation: Patient feels much better but still has some shortness of breath with minimal exertion going to the bathroom.  We checked her ambulatory oximetry and she seems to require 2 to 3 L of oxygen even at rest and with activity.  We will continue IV Solu-Medrol, azithromycin and add as needed DuoNeb.  Reassess tomorrow.  COVID-19 negative.  Leukocytosis: Acute.  WBC elevated 11.2.  Suspect this could be reactive in nature.  Patient denies having any fever or chills.    Procalcitonin unremarkable. -Recheck CBC in a.m.  Hypertensive urgency: On admission blood pressures elevated up to 188/119.  Blood pressure much better now. -Continue amlodipine and ibesartan -Continue labetalol IV as needed  Diastolic congestive heart failure, Pulmonary hypertension: Patient reports 4 pound weight increase.  BNP was mildly increased on 143.8.  Last echocardiogram revealed EF of 65 to 70% back in 02/2016.  CT angiogram did show signs of worsening pulmonary artery hypertension but no vascular congestion.  I do not think she has acute exacerbation.  She received IV Lasix yesterday.  We will resume her home dose of Lasix.  Hypothyroidism: Continue Synthroid.  Diabetes mellitus type 2: Last hemoglobin A1c 6.4.  Blood sugar control.  Continue SSI.   Bipolar disorder -Continue Depakote and Lamictal  DVT prophylaxis: Lovenox Code Status: Full code Family Communication:  None present at bedside.  Plan  of care discussed with patient in length and he verbalized understanding and agreed with it. Disposition Plan: Potential discharge tomorrow  Estimated body mass index is 38.95 kg/m as calculated from the following:   Height as of this  encounter: 4\' 9"  (1.448 m).   Weight as of this encounter: 81.6 kg.      Nutritional status:               Consultants:   None  Procedures:   None  Antimicrobials:   Azithromycin   Subjective: Seen and examined.  Feels better but still with shortness of breath with minimal activity.  No new complaint.  Objective: Vitals:   10/15/19 0819 10/15/19 0835 10/15/19 0841 10/15/19 0900  BP: (!) 134/97     Pulse: 87     Resp: 20     Temp: 98.1 F (36.7 C)     TempSrc: Oral     SpO2: 96% 94% 96% 95%  Weight:      Height:        Intake/Output Summary (Last 24 hours) at 10/15/2019 1308 Last data filed at 10/15/2019 [id number] Gross per 24 hour  Intake 498.22 ml  Output 300 ml  Net 198.22 ml   Filed Weights   10/13/19 2111  Weight: 81.6 kg    Examination:  General exam: Appears calm and comfortable  Respiratory system: Clear to auscultation with possible some diminished breath sounds but no wheezes or crackles. Respiratory effort normal. Cardiovascular system: S1 & S2 heard, RRR. No JVD, murmurs, rubs, gallops or clicks. No pedal edema. Gastrointestinal system: Abdomen is nondistended, soft and nontender. No organomegaly or masses felt. Normal bowel sounds heard. Central nervous system: Alert and oriented. No focal neurological deficits. Extremities: Symmetric 5 x 5 power. Skin: No rashes, lesions or ulcers Psychiatry: Judgement and insight appear normal. Mood & affect appropriate.    Data Reviewed: I have personally reviewed following labs and imaging studies  CBC: Recent Labs  Lab 10/13/19 2140 10/15/19 0410  WBC 11.2* 11.2*  NEUTROABS 8.2*  --   HGB 13.9 13.9  HCT 46.6* 48.6*  MCV 93.8 94.9  PLT 362 [id number]   Basic Metabolic Panel: Recent Labs  Lab 10/13/19 2140 10/15/19 0410  NA 140 139  K 4.0 5.1  CL 101 98  CO2 28 32  GLUCOSE 188* 279*  BUN 9 11  CREATININE 0.83 0.71  CALCIUM 8.6* 9.0   GFR: Estimated Creatinine Clearance: 74.9  mL/min (by C-G formula based on SCr of 0.71 mg/dL). Liver Function Tests: Recent Labs  Lab 10/13/19 2140  AST 25  ALT 35  ALKPHOS 73  BILITOT 0.7  PROT 6.1*  ALBUMIN 3.1*   No results for input(s): LIPASE, AMYLASE in the last 168 hours. No results for input(s): AMMONIA in the last 168 hours. Coagulation Profile: No results for input(s): INR, PROTIME in the last 168 hours. Cardiac Enzymes: No results for input(s): CKTOTAL, CKMB, CKMBINDEX, TROPONINI in the last 168 hours. BNP (last 3 results) No results for input(s): PROBNP in the last 8760 hours. HbA1C: Recent Labs    10/14/19 2111  HGBA1C 8.8*   CBG: Recent Labs  Lab 10/14/19 1827 10/15/19 0016 10/15/19 0816  GLUCAP 281* 305* 190*   Lipid Profile: No results for input(s): CHOL, HDL, LDLCALC, TRIG, CHOLHDL, LDLDIRECT in the last 72 hours. Thyroid Function Tests: Recent Labs    10/14/19 2111  TSH 1.710   Anemia Panel: No results for input(s): VITAMINB12, FOLATE, FERRITIN, TIBC, IRON, RETICCTPCT in the  last 72 hours. Sepsis Labs: Recent Labs  Lab 10/14/19 2111 10/15/19 0410  PROCALCITON <0.10 <0.10    Recent Results (from the past 240 hour(s))  SARS CORONAVIRUS 2 (TAT 6-24 HRS) Nasopharyngeal Nasopharyngeal Swab     Status: None   Collection Time: 10/14/19  1:52 PM   Specimen: Nasopharyngeal Swab  Result Value Ref Range Status   SARS Coronavirus 2 NEGATIVE NEGATIVE Final    Comment: (NOTE) SARS-CoV-2 target nucleic acids are NOT DETECTED. The SARS-CoV-2 RNA is generally detectable in upper and lower respiratory specimens during the acute phase of infection. Negative results do not preclude SARS-CoV-2 infection, do not rule out co-infections with other pathogens, and should not be used as the sole basis for treatment or other patient management decisions. Negative results must be combined with clinical observations, patient history, and epidemiological information. The expected result is Negative. Fact  Sheet for Patients: SugarRoll.be Fact Sheet for Healthcare Providers: https://www.woods-mathews.com/ This test is not yet approved or cleared by the [place] FDA and  has been authorized for detection and/or diagnosis of SARS-CoV-2 by FDA under an Emergency Use Authorization (EUA). This EUA will remain  in effect (meaning this test can be used) for the duration of the COVID-19 declaration under Section 56 4(b)(1) of the Act, 21 U.S.C. section 360bbb-3(b)(1), unless the authorization is terminated or revoked sooner. Performed at [facility], [place] [address], [place], [place] [address]       Radiology Studies: Dg Chest 2 View  Result Date: 10/13/2019 CLINICAL DATA:  Wheezing sensation and shortness of breath for 1 week, fever, oxygen desaturation, history CHF, diabetes mellitus, hypertension EXAM: CHEST - 2 VIEW COMPARISON:  11/23/2016 FINDINGS: VP shunt tubing traverses RIGHT hemithorax. Enlargement of cardiac silhouette. Mediastinal contours and pulmonary vascularity normal. Mild bronchitic changes with bibasilar atelectasis. Lungs otherwise clear. Minimal chronic thickening at minor fissure. No definite acute infiltrate, pleural effusion, or pneumothorax. RIGHT upper lobe nodular density 10 x 9 mm. Questionable LEFT perihilar nodular density versus prominent vascular marking. IMPRESSION: Enlargement of cardiac silhouette. Bronchitic changes with bibasilar atelectasis. RIGHT upper lobe nodular density 10 x 9 mm with questionable LEFT perihilar nodular density versus prominent vascular marking; further evaluation by CT imaging of the chest recommended, with contrast if patient's renal function permits. Electronically Signed   By: Lavonia Dana M.D.   On: 10/13/2019 22:04   Ct Angio Chest Pe W/cm &/or Wo Cm  Result Date: 10/14/2019 CLINICAL DATA:  Shortness of breath. EXAM: CT ANGIOGRAPHY CHEST WITH CONTRAST TECHNIQUE: Multidetector CT imaging  of the chest was performed using the standard protocol during bolus administration of intravenous contrast. Multiplanar CT image reconstructions and MIPs were obtained to evaluate the vascular anatomy. CONTRAST:  5mL OMNIPAQUE IOHEXOL 350 MG/ML SOLN COMPARISON:  Chest x-ray dated 10/13/2019 and CT angiogram of the chest dated 09/06/2016 FINDINGS: Cardiovascular: No pulmonary emboli. There is mild cardiomegaly. There is chronic dilatation of the pulmonary arteries, increased to 3.6 mm of the main pulmonary artery from 3.2 mm on the prior study. No pericardial effusion. No coronary artery calcifications or aortic atherosclerosis. Mediastinum/Nodes: No enlarged mediastinal, hilar, or axillary lymph nodes. Thyroid gland, trachea, and esophagus demonstrate no significant findings. Lungs/Pleura: Patient has a chronic ground-glass mosaic pattern of patchy densities in both lungs, similar to that seen on the prior study. New area of atelectasis in the anterior aspect of the right lower lobe. Areas of atelectasis or posteriorly in the right lower lobe on the prior study have resolved. There  is bilateral chronic peribronchial thickening. Upper Abdomen: Chronic hepatomegaly. No acute abnormalities. Musculoskeletal: No chest wall abnormality. No acute or significant osseous findings. Review of the MIP images confirms the above findings. IMPRESSION: 1. No pulmonary emboli. 2. Progressive enlargement of the pulmonary arteries consistent with pulmonary arterial hypertension. 3. Chronic ground-glass mosaic pattern of patchy densities in both lungs, similar to that seen on the prior study, likely related to the patient's pulmonary arterial hypertension. 4. New area of atelectasis in the anterior aspect of the right lower lobe. 5. Chronic bilateral peribronchial thickening. 6. Chronic hepatomegaly. Aortic Atherosclerosis (ICD10-I70.0). Electronically Signed   By: Lorriane Shire M.D.   On: 10/14/2019 11:04    Scheduled Meds:   albuterol  2.5 mg Inhalation TID   amLODipine  10 mg Oral Daily   divalproex  500 mg Oral Daily   enoxaparin (LOVENOX) injection  40 mg Subcutaneous Q24H   guaiFENesin  600 mg Oral BID   insulin aspart  0-15 Units Subcutaneous TID WC   irbesartan  300 mg Oral Daily   lamoTRIgine  100 mg Oral Daily   levothyroxine  50 mcg Oral Q0600   living well with diabetes book   Does not apply Once   methylPREDNISolone (SOLU-MEDROL) injection  60 mg Intravenous Q12H   sodium chloride flush  3 mL Intravenous Q12H   Continuous Infusions:  sodium chloride Stopped (10/15/19 0538)   azithromycin Stopped (10/14/19 2242)     LOS: 0 days   Time spent: 32 minutes   Darliss Cheney, MD [facility]  10/15/2019, 1:08 PM   To contact the attending provider between 7A-7P or the covering provider during after hours 7P-7A, please log into the web site [email or web address] and use password [email or web address].

## 2019-10-16 ENCOUNTER — Inpatient Hospital Stay (HOSPITAL_COMMUNITY): Payer: Federal, State, Local not specified - PPO

## 2019-10-16 DIAGNOSIS — I340 Nonrheumatic mitral (valve) insufficiency: Secondary | ICD-10-CM | POA: Diagnosis not present

## 2019-10-16 DIAGNOSIS — F3170 Bipolar disorder, currently in remission, most recent episode unspecified: Secondary | ICD-10-CM | POA: Diagnosis not present

## 2019-10-16 DIAGNOSIS — I5032 Chronic diastolic (congestive) heart failure: Secondary | ICD-10-CM | POA: Diagnosis not present

## 2019-10-16 DIAGNOSIS — J9601 Acute respiratory failure with hypoxia: Secondary | ICD-10-CM | POA: Diagnosis not present

## 2019-10-16 DIAGNOSIS — I10 Essential (primary) hypertension: Secondary | ICD-10-CM | POA: Diagnosis not present

## 2019-10-16 LAB — GLUCOSE, CAPILLARY
Glucose-Capillary: 267 mg/dL — ABNORMAL HIGH (ref 70–99)
Glucose-Capillary: 320 mg/dL — ABNORMAL HIGH (ref 70–99)
Glucose-Capillary: 328 mg/dL — ABNORMAL HIGH (ref 70–99)
Glucose-Capillary: 202 mg/dL — ABNORMAL HIGH (ref 70–99)

## 2019-10-16 LAB — ECHOCARDIOGRAM COMPLETE
Height: 57 in
Weight: 2917.13 oz

## 2019-10-16 LAB — PROCALCITONIN: Procalcitonin: <0.1 ng/mL

## 2019-10-16 MED ORDER — IPRATROPIUM-ALBUTEROL 0.5-2.5 (3) MG/3ML IN SOLN
3.0000 mL | Freq: Three times a day (TID) | RESPIRATORY_TRACT | Status: DC
  Administered 2019-10-16 – 2019-10-17 (×4): 3 mL via RESPIRATORY_TRACT
  Filled 2019-10-16 (×4): qty 3

## 2019-10-16 MED ORDER — POLYETHYLENE GLYCOL 3350 17 G PO PACK
17.0000 g | PACK | Freq: Every day | ORAL | Status: DC
  Administered 2019-10-16 – 2019-10-17 (×2): 17 g via ORAL
  Filled 2019-10-16 (×2): qty 1

## 2019-10-16 MED ORDER — INSULIN DETEMIR 100 UNIT/ML ~~LOC~~ SOLN
16.0000 [IU] | Freq: Every day | SUBCUTANEOUS | Status: DC
  Administered 2019-10-16: 16 [IU] via SUBCUTANEOUS
  Filled 2019-10-16 (×2): qty 0.16

## 2019-10-16 MED ORDER — INSULIN ASPART 100 UNIT/ML ~~LOC~~ SOLN
0.0000 [IU] | Freq: Three times a day (TID) | SUBCUTANEOUS | Status: DC
  Administered 2019-10-17: 11 [IU] via SUBCUTANEOUS

## 2019-10-16 MED ORDER — AZITHROMYCIN 250 MG PO TABS
500.0000 mg | ORAL_TABLET | Freq: Every day | ORAL | Status: DC
  Administered 2019-10-16: 500 mg via ORAL
  Filled 2019-10-16 (×2): qty 2

## 2019-10-16 MED ORDER — INSULIN ASPART 100 UNIT/ML ~~LOC~~ SOLN
0.0000 [IU] | Freq: Every day | SUBCUTANEOUS | Status: DC
  Administered 2019-10-16: 2 [IU] via SUBCUTANEOUS

## 2019-10-16 NOTE — Progress Notes (Signed)
PROGRESS NOTE    [name]  [id number] DOB: Mar 22, 1971 DOA: 10/13/2019 PCP: Glendon Axe, MD    Brief Narrative:  48 year old female with a history of hypertension, diastolic heart failure, diabetes, struct of sleep apnea, presents to the hospital with a 4 to 5-day history of worsening shortness of breath and wheezing.  CT scan of the chest did not show any evidence of pulmonary emboli.  It was felt that she may have some element of bronchitis.  She was admitted for intravenous steroids, bronchodilators and antibiotics.  Her overall condition has slowly improved.   Assessment & Plan:   Active Problems:   Leukocytosis   Chronic diastolic CHF (congestive heart failure) (HCC)   Type 2 diabetes mellitus with complication (HCC)   Essential hypertension   COPD with acute exacerbation (HCC)   Bipolar disorder (HCC)   Acute respiratory failure with hypoxia (HCC)   Pulmonary artery hypertension (HCC)   Hypothyroidism   1. Acute respiratory failure with hypoxia, secondary to possible bronchitis.  Patient denies any history of tobacco use or any history of asthma.  She reports that she does not normally use any breathing medicines at home.  She was admitted to the hospital with shortness of breath, wheezing and cough.  She was started on steroids, bronchodilators and overall her condition is slowly improving.  She was requiring supplemental oxygen for hypoxia and is being weaned down as tolerated.  Imaging including CT angiogram of the chest was negative for pulmonary embolus.  He did comment on bronchitis.  Continue supportive treatments. 2. Chronic diastolic congestive heart failure.  BNP minimally elevated at 143.  Clinically, she does not appear to be particularly volume overloaded at this time.  Due to concerns for pulmonary hypertension on CT imaging, echocardiogram was ordered today.  They were unable to comment on her pulmonary artery.  Continue on her home dose of Lasix. 3.  Obstructive sleep apnea.  She reports that she has been diagnosed with sleep apnea.  Has been noncompliant with CPAP.  This certainly could be contributing to her pulmonary hypertension.  Recommended that she have repeat sleep study through her primary care physician on discharge. 4. Hypertensive urgency.  Blood pressure was 188/119 on admission.  She is continued on amlodipine and irbesartan.  Overall blood pressure has improved. 5. Hypothyroidism.  Continue Synthroid 6. Diabetes.  Blood sugars are uncontrolled in the setting of steroids.  Home medications have been held.  Started on Levemir and increase sliding scale insulin to resistant scale. 7. Bipolar disorder.  Continue Depakote and Lamictal.   DVT prophylaxis: Lovenox Code Status: Full code Family Communication: None present Disposition Plan: Discharge home once respiratory status improves.  Currently she needs continued inpatient treatment in the hospital since she is still short of breath on exertion and feels she has difficulty ambulating.  She is currently still requiring supplemental oxygen.  Anticipate she may be able to discharge home in the next 24 hours if this improves.   Consultants:     Procedures:     Antimicrobials:   Azithromycin 10/27>   Subjective: Still feels short of breath on exertion.,  Hypoxic on room air.  Overall wheezing is better.  Unable to ambulate without becoming short of breath.  Does not feel back to baseline.  Objective: Vitals:   10/15/19 2321 10/16/19 0500 10/16/19 0816 10/16/19 0943  BP: 113/73   137/74  Pulse: 94   98  Resp: 20   16  Temp: 98 F (36.7 C)  98.3 F (36.8 C)  TempSrc: Oral   Oral  SpO2: 98%  (!) 89% 94%  Weight:  82.7 kg    Height:        Intake/Output Summary (Last 24 hours) at 10/16/2019 1336 Last data filed at 10/16/2019 0900 Gross per 24 hour  Intake 1083 ml  Output 1225 ml  Net -142 ml   Filed Weights   10/13/19 2111 10/16/19 0500  Weight: 81.6 kg  82.7 kg    Examination:  General exam: Appears calm and comfortable  Respiratory system: Clear to auscultation. Respiratory effort normal. Cardiovascular system: S1 & S2 heard, RRR. No JVD, murmurs, rubs, gallops or clicks.  Trace pedal edema. Gastrointestinal system: Abdomen is nondistended, soft and nontender. No organomegaly or masses felt. Normal bowel sounds heard. Central nervous system: Alert and oriented. No focal neurological deficits. Extremities: Symmetric 5 x 5 power. Skin: No rashes, lesions or ulcers Psychiatry: Judgement and insight appear normal. Mood & affect appropriate.     Data Reviewed: I have personally reviewed following labs and imaging studies  CBC: Recent Labs  Lab 10/13/19 2140 10/15/19 0410  WBC 11.2* 11.2*  NEUTROABS 8.2*  --   HGB 13.9 13.9  HCT 46.6* 48.6*  MCV 93.8 94.9  PLT 362 [id number]   Basic Metabolic Panel: Recent Labs  Lab 10/13/19 2140 10/15/19 0410  NA 140 139  K 4.0 5.1  CL 101 98  CO2 28 32  GLUCOSE 188* 279*  BUN 9 11  CREATININE 0.83 0.71  CALCIUM 8.6* 9.0   GFR: Estimated Creatinine Clearance: 75.5 mL/min (by C-G formula based on SCr of 0.71 mg/dL). Liver Function Tests: Recent Labs  Lab 10/13/19 2140  AST 25  ALT 35  ALKPHOS 73  BILITOT 0.7  PROT 6.1*  ALBUMIN 3.1*   No results for input(s): LIPASE, AMYLASE in the last 168 hours. No results for input(s): AMMONIA in the last 168 hours. Coagulation Profile: No results for input(s): INR, PROTIME in the last 168 hours. Cardiac Enzymes: No results for input(s): CKTOTAL, CKMB, CKMBINDEX, TROPONINI in the last 168 hours. BNP (last 3 results) No results for input(s): PROBNP in the last 8760 hours. HbA1C: Recent Labs    10/14/19 2111  HGBA1C 8.8*   CBG: Recent Labs  Lab 10/15/19 1334 10/15/19 1716 10/15/19 2128 10/16/19 0834 10/16/19 1134  GLUCAP 276* 326* 163* 267* 320*   Lipid Profile: No results for input(s): CHOL, HDL, LDLCALC, TRIG, CHOLHDL,  LDLDIRECT in the last 72 hours. Thyroid Function Tests: Recent Labs    10/14/19 2111  TSH 1.710   Anemia Panel: No results for input(s): VITAMINB12, FOLATE, FERRITIN, TIBC, IRON, RETICCTPCT in the last 72 hours. Sepsis Labs: Recent Labs  Lab 10/14/19 2111 10/15/19 0410 10/16/19 0418  PROCALCITON <0.10 <0.10 <0.10    Recent Results (from the past 240 hour(s))  SARS CORONAVIRUS 2 (TAT 6-24 HRS) Nasopharyngeal Nasopharyngeal Swab     Status: None   Collection Time: 10/14/19  1:52 PM   Specimen: Nasopharyngeal Swab  Result Value Ref Range Status   SARS Coronavirus 2 NEGATIVE NEGATIVE Final    Comment: (NOTE) SARS-CoV-2 target nucleic acids are NOT DETECTED. The SARS-CoV-2 RNA is generally detectable in upper and lower respiratory specimens during the acute phase of infection. Negative results do not preclude SARS-CoV-2 infection, do not rule out co-infections with other pathogens, and should not be used as the sole basis for treatment or other patient management decisions. Negative results must be combined with clinical observations, patient history,  and epidemiological information. The expected result is Negative. Fact Sheet for Patients: SugarRoll.be Fact Sheet for Healthcare Providers: https://www.woods-mathews.com/ This test is not yet approved or cleared by the [place] FDA and  has been authorized for detection and/or diagnosis of SARS-CoV-2 by FDA under an Emergency Use Authorization (EUA). This EUA will remain  in effect (meaning this test can be used) for the duration of the COVID-19 declaration under Section 56 4(b)(1) of the Act, 21 U.S.C. section 360bbb-3(b)(1), unless the authorization is terminated or revoked sooner. Performed at [facility], [place] [address] [place],  [address]          Radiology Studies: No results found.      Scheduled Meds: . amLODipine  10 mg Oral Daily  .  azithromycin  500 mg Oral Daily  . divalproex  500 mg Oral Daily  . enoxaparin (LOVENOX) injection  40 mg Subcutaneous Q24H  . guaiFENesin  600 mg Oral BID  . insulin aspart  0-15 Units Subcutaneous TID WC  . ipratropium-albuterol  3 mL Nebulization TID  . irbesartan  300 mg Oral Daily  . lamoTRIgine  100 mg Oral Daily  . levothyroxine  50 mcg Oral Q0600  . methylPREDNISolone (SOLU-MEDROL) injection  60 mg Intravenous Q12H  . polyethylene glycol  17 g Oral Daily  . sodium chloride flush  3 mL Intravenous Q12H   Continuous Infusions: . sodium chloride Stopped (10/15/19 0538)     LOS: 1 day    Time spent: 71mins    Kathie Dike, MD [facility]   If 7PM-7AM, please contact night-coverage [email or web address]  10/16/2019, 1:36 PM

## 2019-10-16 NOTE — Progress Notes (Signed)
Daily Nursing Note  Received report from [place], [place]. Patient in no distress at the time of assessment. Vocalized sleeping very poorly and requested a stool softener for a lack of bowel movement. Gotten OOB in afternoon was able to ambulate 50 steps then 200 in early evening. Remains on 2LPM [place]. TTE completed. Blood sugars remain poorly controlled in the 200-300 range. All patient needs met.

## 2019-10-16 NOTE — Progress Notes (Signed)
  Echocardiogram 2D Echocardiogram has been performed.  Bobbye Charleston 10/16/2019, 3:14 PM

## 2019-10-16 NOTE — Progress Notes (Signed)
Inpatient Diabetes Program Recommendations  AACE/ADA: New Consensus Statement on Inpatient Glycemic Control (2015)  Target Ranges:  Prepandial:   less than 140 mg/dL      Peak postprandial:   less than 180 mg/dL (1-2 hours)      Critically ill patients:  140 - 180 mg/dL   Lab Results  Component Value Date   GLUCAP 320 (H) 10/16/2019   HGBA1C 8.8 (H) 10/14/2019    Review of Glycemic Control Results for [name], [name] (MRN [id number]) as of 10/16/2019 13:00  Ref. Range 10/15/2019 13:34 10/15/2019 17:16 10/15/2019 21:28 10/16/2019 08:34 10/16/2019 11:34  Glucose-Capillary Latest Ref Range: 70 - 99 mg/dL 276 (H) 326 (H) 163 (H) 267 (H) 320 (H)   Diabetes history: DM2 Outpatient Diabetes medications: Metformin 1 gm bid + Trulicity weekly Current orders for Inpatient glycemic control: Novolog moderate correction tid + Solumedrol 60 mg bid  Inpatient Diabetes Program Recommendations:   While Metformin & Trulicity held: -Lantus 16 units daily (0.2 units/kg x 82.7 kg) -Increase Novolog correction to resistant + hs 0-5  Thank you, Bethena Roys E. Karla Vines, RN, MSN, CDE  Diabetes Coordinator Inpatient Glycemic Control Team Team Pager [phone] (8am-5pm) 10/16/2019 1:07 PM

## 2019-10-17 DIAGNOSIS — J441 Chronic obstructive pulmonary disease with (acute) exacerbation: Secondary | ICD-10-CM | POA: Diagnosis not present

## 2019-10-17 LAB — GLUCOSE, CAPILLARY
Glucose-Capillary: 296 mg/dL — ABNORMAL HIGH (ref 70–99)
Glucose-Capillary: 313 mg/dL — ABNORMAL HIGH (ref 70–99)

## 2019-10-17 MED ORDER — AZITHROMYCIN 250 MG PO TABS: 250.0000 mg | ORAL_TABLET | Freq: Every day | ORAL | 0 refills | Status: AC

## 2019-10-17 MED ORDER — PREDNISONE 20 MG PO TABS: 20.0000 mg | ORAL_TABLET | Freq: Every day | ORAL | 0 refills | Status: AC

## 2019-10-17 NOTE — Discharge Summary (Signed)
Physician Discharge Summary  [name] [id number] DOB: December 18, 1971 DOA: 10/13/2019  PCP: Glendon Axe, MD  Admit date: 10/13/2019 Discharge date: 10/17/2019  Admitted From: Home Disposition: Home  Recommendations for Outpatient Follow-up:  1. Follow up with PCP in 1-2 weeks 2. Follow-up with [facility], will need repeat sleep apnea studies and compliance to CPAP.  Home Health: Not applicable Equipment/Devices: Oxygen, 2 L/min for ambulation  Discharge Condition: Stable CODE STATUS: Full code Diet recommendation: Low-carb diet  Discharge summary: 48 year old female with history of hypertension, diastolic heart failure, type 2 diabetes, obstructive sleep apnea who presented to the hospital with 4 to 5 days of worsening shortness of breath and wheezing.  Underwent CTA of the chest was negative for PE, no evidence of pneumonia, probable pulmonary hypertension.  Due to significant symptoms and hypoxia she was admitted to the hospital.  2D echocardiogram was essentially normal with some diastolic dysfunction.  She was found with acute wheezy bronchitis and treated with steroids and bronchodilators and short course of azithromycin.  She has done adequate improvement, still has some congestion but most of the symptoms improved.  Patient was ambulated, she is hypoxic with oxygen saturations less than 80% on mobility.  Patient has untreated sleep apnea, probably has underlying pulmonary hypertension, chronic PE ruled out.  Her hypoxia is probably multifactorial.  She will go home with supplemental oxygen on ambulation, she will go back to her pulmonologist for retesting for her sleep apnea.  Short course of prednisone and azithromycin was prescribed.  She will use over-the-counter cough medications.  Discharge Diagnoses:  Active Problems:   Leukocytosis   Chronic diastolic CHF (congestive heart failure) (HCC)   Type 2 diabetes mellitus with complication (HCC)   Essential  hypertension   COPD with acute exacerbation (HCC)   Bipolar disorder (HCC)   Acute respiratory failure with hypoxia (HCC)   Pulmonary artery hypertension ([place])   Hypothyroidism    Discharge Instructions  Discharge Instructions    Diet Carb Modified   Complete by: As directed    Discharge instructions   Complete by: As directed    Follow up with your primary doctors office for repeat sleep apnea testing and device. You can use over the counter cough medicine for diabetics to loosen your phlegm   Increase activity slowly   Complete by: As directed      Allergies as of 10/17/2019      Reactions   Abilify [aripiprazole] Other (See Comments)   Reaction:  Hyperglycemia      Medication List    STOP taking these medications   furosemide 40 MG tablet Commonly known as: LASIX     TAKE these medications   amLODipine 10 MG tablet Commonly known as: NORVASC Take 10 mg by mouth daily.   azithromycin 250 MG tablet Commonly known as: ZITHROMAX Take 1 tablet (250 mg total) by mouth daily for 3 days.   divalproex 500 MG 24 hr tablet Commonly known as: DEPAKOTE ER Take 500 mg by mouth daily.   ezetimibe 10 MG tablet Commonly known as: ZETIA Take 10 mg by mouth daily.   Integra Plus Caps Take 1 capsule by mouth every evening.   irbesartan 300 MG tablet Commonly known as: AVAPRO Take 300 mg by mouth daily.   lamoTRIgine 200 MG tablet Commonly known as: LAMICTAL Take 100 mg by mouth daily.   levothyroxine 50 MCG tablet Commonly known as: SYNTHROID Take 50 mcg by mouth daily before breakfast.   metFORMIN 1000 MG tablet  Commonly known as: GLUCOPHAGE Take 1,000 mg by mouth 2 (two) times daily with a meal.   predniSONE 20 MG tablet Commonly known as: DELTASONE Take 1 tablet (20 mg total) by mouth daily for 4 days.   Trulicity 1.5 0000000 Sopn Generic drug: Dulaglutide Inject 1.5 mg into the skin every 7 (seven) days.            Durable Medical Equipment   (From admission, onward)         Start     Ordered   Unscheduled  DME Oxygen  Once    Comments: Patient sitting in chair on RA=90% Patient sitting on edge of chair on RA= 87% Patient sats on RA while walking=56%,  Walking On 2 liters of oxygen = 91%.  Question Answer Comment  Length of Need Lifetime   Mode or (Route) Nasal cannula   Liters per Minute 2   Frequency Continuous (stationary and portable oxygen unit needed)   Oxygen conserving device Yes   Oxygen delivery system Gas      10/17/19 1147          Allergies  Allergen Reactions  . Abilify [Aripiprazole] Other (See Comments)    Reaction:  Hyperglycemia    Consultations:  None   Procedures/Studies: Dg Chest 2 View  Result Date: 10/13/2019 CLINICAL DATA:  Wheezing sensation and shortness of breath for 1 week, fever, oxygen desaturation, history CHF, diabetes mellitus, hypertension EXAM: CHEST - 2 VIEW COMPARISON:  11/23/2016 FINDINGS: VP shunt tubing traverses RIGHT hemithorax. Enlargement of cardiac silhouette. Mediastinal contours and pulmonary vascularity normal. Mild bronchitic changes with bibasilar atelectasis. Lungs otherwise clear. Minimal chronic thickening at minor fissure. No definite acute infiltrate, pleural effusion, or pneumothorax. RIGHT upper lobe nodular density 10 x 9 mm. Questionable LEFT perihilar nodular density versus prominent vascular marking. IMPRESSION: Enlargement of cardiac silhouette. Bronchitic changes with bibasilar atelectasis. RIGHT upper lobe nodular density 10 x 9 mm with questionable LEFT perihilar nodular density versus prominent vascular marking; further evaluation by CT imaging of the chest recommended, with contrast if patient's renal function permits. Electronically Signed   By: Lavonia Dana M.D.   On: 10/13/2019 22:04   Ct Angio Chest Pe W/cm &/or Wo Cm  Result Date: 10/14/2019 CLINICAL DATA:  Shortness of breath. EXAM: CT ANGIOGRAPHY CHEST WITH CONTRAST TECHNIQUE:  Multidetector CT imaging of the chest was performed using the standard protocol during bolus administration of intravenous contrast. Multiplanar CT image reconstructions and MIPs were obtained to evaluate the vascular anatomy. CONTRAST:  2mL OMNIPAQUE IOHEXOL 350 MG/ML SOLN COMPARISON:  Chest x-ray dated 10/13/2019 and CT angiogram of the chest dated 09/06/2016 FINDINGS: Cardiovascular: No pulmonary emboli. There is mild cardiomegaly. There is chronic dilatation of the pulmonary arteries, increased to 3.6 mm of the main pulmonary artery from 3.2 mm on the prior study. No pericardial effusion. No coronary artery calcifications or aortic atherosclerosis. Mediastinum/Nodes: No enlarged mediastinal, hilar, or axillary lymph nodes. Thyroid gland, trachea, and esophagus demonstrate no significant findings. Lungs/Pleura: Patient has a chronic ground-glass mosaic pattern of patchy densities in both lungs, similar to that seen on the prior study. New area of atelectasis in the anterior aspect of the right lower lobe. Areas of atelectasis or posteriorly in the right lower lobe on the prior study have resolved. There is bilateral chronic peribronchial thickening. Upper Abdomen: Chronic hepatomegaly. No acute abnormalities. Musculoskeletal: No chest wall abnormality. No acute or significant osseous findings. Review of the MIP images confirms the above findings. IMPRESSION: 1. No  pulmonary emboli. 2. Progressive enlargement of the pulmonary arteries consistent with pulmonary arterial hypertension. 3. Chronic ground-glass mosaic pattern of patchy densities in both lungs, similar to that seen on the prior study, likely related to the patient's pulmonary arterial hypertension. 4. New area of atelectasis in the anterior aspect of the right lower lobe. 5. Chronic bilateral peribronchial thickening. 6. Chronic hepatomegaly. Aortic Atherosclerosis (ICD10-I70.0). Electronically Signed   By: Lorriane Shire M.D.   On: 10/14/2019 11:04       Subjective: Patient was seen and examined.  No overnight events.  Has some wet cough but denies any other complaints.  Afebrile.  No sputum production.   Discharge Exam: Vitals:   10/17/19 0415 10/17/19 0735  BP: (!) 150/105 (!) 168/96  Pulse: 84 79  Resp:  16  Temp: 98 F (36.7 C) 98 F (36.7 C)  SpO2: 93% 98%   Vitals:   10/16/19 2053 10/16/19 2127 10/17/19 0415 10/17/19 0735  BP: (!) 147/93  (!) 150/105 (!) 168/96  Pulse: 94 94 84 79  Resp: 20 19  16   Temp: 98.3 F (36.8 C)  98 F (36.7 C) 98 F (36.7 C)  TempSrc: Oral  Oral Oral  SpO2: 92% 93% 93% 98%  Weight:      Height:        General: Pt is alert, awake, not in acute distress, on 2 L oxygen. Cardiovascular: RRR, S1/S2 +, no rubs, no gallops Respiratory: CTA bilaterally, no wheezing, no rhonchi, some bronchial upper airway sounds. Abdominal: Soft, NT, ND, bowel sounds + Extremities: no edema, no cyanosis    The results of significant diagnostics from this hospitalization (including imaging, microbiology, ancillary and laboratory) are listed below for reference.     Microbiology: Recent Results (from the past 240 hour(s))  SARS CORONAVIRUS 2 (TAT 6-24 HRS) Nasopharyngeal Nasopharyngeal Swab     Status: None   Collection Time: 10/14/19  1:52 PM   Specimen: Nasopharyngeal Swab  Result Value Ref Range Status   SARS Coronavirus 2 NEGATIVE NEGATIVE Final    Comment: (NOTE) SARS-CoV-2 target nucleic acids are NOT DETECTED. The SARS-CoV-2 RNA is generally detectable in upper and lower respiratory specimens during the acute phase of infection. Negative results do not preclude SARS-CoV-2 infection, do not rule out co-infections with other pathogens, and should not be used as the sole basis for treatment or other patient management decisions. Negative results must be combined with clinical observations, patient history, and epidemiological information. The expected result is Negative. Fact Sheet for  Patients: SugarRoll.be Fact Sheet for Healthcare Providers: https://www.woods-mathews.com/ This test is not yet approved or cleared by the [place] FDA and  has been authorized for detection and/or diagnosis of SARS-CoV-2 by FDA under an Emergency Use Authorization (EUA). This EUA will remain  in effect (meaning this test can be used) for the duration of the COVID-19 declaration under Section 56 4(b)(1) of the Act, 21 U.S.C. section 360bbb-3(b)(1), unless the authorization is terminated or revoked sooner. Performed at [facility], [place] [address], [place], [place] [address]      Labs: BNP (last 3 results) Recent Labs    10/13/19 2140  BNP 123456*   Basic Metabolic Panel: Recent Labs  Lab 10/13/19 2140 10/15/19 0410  NA 140 139  K 4.0 5.1  CL 101 98  CO2 28 32  GLUCOSE 188* 279*  BUN 9 11  CREATININE 0.83 0.71  CALCIUM 8.6* 9.0   Liver Function Tests: Recent Labs  Lab 10/13/19 2140  AST 25  ALT 35  ALKPHOS 73  BILITOT 0.7  PROT 6.1*  ALBUMIN 3.1*   No results for input(s): LIPASE, AMYLASE in the last 168 hours. No results for input(s): AMMONIA in the last 168 hours. CBC: Recent Labs  Lab 10/13/19 2140 10/15/19 0410  WBC 11.2* 11.2*  NEUTROABS 8.2*  --   HGB 13.9 13.9  HCT 46.6* 48.6*  MCV 93.8 94.9  PLT 362 395   Cardiac Enzymes: No results for input(s): CKTOTAL, CKMB, CKMBINDEX, TROPONINI in the last 168 hours. BNP: Invalid input(s): POCBNP CBG: Recent Labs  Lab 10/16/19 0834 10/16/19 1134 10/16/19 1519 10/16/19 2050 10/17/19 0735  GLUCAP 267* 320* 328* 202* 296*   D-Dimer No results for input(s): DDIMER in the last 72 hours. Hgb A1c Recent Labs    10/14/19 2111  HGBA1C 8.8*   Lipid Profile No results for input(s): CHOL, HDL, LDLCALC, TRIG, CHOLHDL, LDLDIRECT in the last 72 hours. Thyroid function studies Recent Labs    10/14/19 2111  TSH 1.710   Anemia work up No results  for input(s): VITAMINB12, FOLATE, FERRITIN, TIBC, IRON, RETICCTPCT in the last 72 hours. Urinalysis    Component Value Date/Time   COLORURINE YELLOW 02/19/2016 1815   APPEARANCEUR CLOUDY (A) 02/19/2016 1815   LABSPEC 1.013 02/19/2016 1815   PHURINE 5.0 02/19/2016 1815   GLUCOSEU NEGATIVE 02/19/2016 1815   HGBUR NEGATIVE 02/19/2016 1815   BILIRUBINUR NEGATIVE 02/19/2016 1815   KETONESUR NEGATIVE 02/19/2016 1815   PROTEINUR NEGATIVE 02/19/2016 1815   UROBILINOGEN 0.2 04/01/2013 1014   NITRITE NEGATIVE 02/19/2016 1815   LEUKOCYTESUR SMALL (A) 02/19/2016 1815   Sepsis Labs Invalid input(s): PROCALCITONIN,  WBC,  LACTICIDVEN Microbiology Recent Results (from the past 240 hour(s))  SARS CORONAVIRUS 2 (TAT 6-24 HRS) Nasopharyngeal Nasopharyngeal Swab     Status: None   Collection Time: 10/14/19  1:52 PM   Specimen: Nasopharyngeal Swab  Result Value Ref Range Status   SARS Coronavirus 2 NEGATIVE NEGATIVE Final    Comment: (NOTE) SARS-CoV-2 target nucleic acids are NOT DETECTED. The SARS-CoV-2 RNA is generally detectable in upper and lower respiratory specimens during the acute phase of infection. Negative results do not preclude SARS-CoV-2 infection, do not rule out co-infections with other pathogens, and should not be used as the sole basis for treatment or other patient management decisions. Negative results must be combined with clinical observations, patient history, and epidemiological information. The expected result is Negative. Fact Sheet for Patients: SugarRoll.be Fact Sheet for Healthcare Providers: https://www.woods-mathews.com/ This test is not yet approved or cleared by the [place] FDA and  has been authorized for detection and/or diagnosis of SARS-CoV-2 by FDA under an Emergency Use Authorization (EUA). This EUA will remain  in effect (meaning this test can be used) for the duration of the COVID-19 declaration under  Section 56 4(b)(1) of the Act, 21 U.S.C. section 360bbb-3(b)(1), unless the authorization is terminated or revoked sooner. Performed at [facility], [place] [address] [place], [place] [address]      Time coordinating discharge: 35 minutes  SIGNED:   Barb Merino, MD  [facility] 10/17/2019, 11:47 AM

## 2019-10-17 NOTE — Progress Notes (Signed)
SATURATION QUALIFICATIONS: (This note is used to comply with regulatory documentation for home oxygen)  Patient Saturations on Room Air at Rest = 90%  Patient Saturations on Room Air while Ambulating = 56%  Patient Saturations on 1Liters of oxygen while Ambulating = 83-85%  Please briefly explain why patient needs home oxygen:  Patient sitting in chair on RA=90% Patient sitting on edge of chair on RA= 87% Patient sats on RA while walking=56%, walking back to room placed on 1L 83-85%, O2 increased to 2L sats 91%, increased to 3LNC sats 96%, after 5 minutes in chair decreased O@ to [facility], sats 95-97%. Decreased O2 to 1.5LNCwith pt in chair O2 sats now 95%

## 2019-10-17 NOTE — TOC Transition Note (Signed)
Transition of Care [facility]) - CM/SW Discharge Note   Patient Details  Name: [name] MRN: [id number] Date of Birth: 02-23-1971  [facility]) CM/SW Contact:  Maryclare Labrador, RN Phone Number: 10/17/2019, 12:36 PM   Clinical Narrative:   PTA independent from home with spouse.  Pt has PCP and denied barriers with paying for medications.  Pt is agreeable with home oxygen - CM provided choice for oxygen - pt chose [facility] - agency contacted and referral accepted.  Pt will transport home via private vehicle driven by husband.  No other CM needs    Final next level of care: Home/Self Care Barriers to Discharge: Barriers Resolved   Patient Goals and CMS Choice Patient states their goals for this hospitalization and ongoing recovery are:: Pt states she is ready to get back home and feel better CMS Medicare.gov Compare Post Acute Care list provided to:: Patient Choice offered to / list presented to : Patient  Discharge Placement                       Discharge Plan and Services                DME Arranged: Oxygen DME Agency: [facility] Date DME Agency Contacted: 10/17/19 Time DME Agency Contacted: [phone] Representative spoke with at DME Agency: [place] ([place]) Interventions     Readmission Risk Interventions No flowsheet data found.

## 2020-04-03 ENCOUNTER — Ambulatory Visit: Payer: Federal, State, Local not specified - PPO | Attending: Internal Medicine

## 2020-04-03 DIAGNOSIS — Z23 Encounter for immunization: Secondary | ICD-10-CM

## 2020-04-03 NOTE — Progress Notes (Signed)
   Covid-19 Vaccination Clinic  Name:  [name]    MRN: [id number] DOB: Oct 20, 1971  04/03/2020  [name] was observed post Covid-19 immunization for 15 minutes without incident. She was provided with Vaccine Information Sheet and instruction to access the V-Safe system.   [name] was instructed to call 911 with any severe reactions post vaccine: Marland Kitchen Difficulty breathing  . Swelling of face and throat  . A fast heartbeat  . A bad rash all over body  . Dizziness and weakness   Immunizations Administered    Name Date Dose VIS Date Route   Pfizer COVID-19 Vaccine 04/03/2020 12:28 PM 0.3 mL 11/28/2019 Intramuscular   Manufacturer: [place]   Lot: [id number]   [place]: [id number]

## 2020-04-26 ENCOUNTER — Ambulatory Visit: Payer: Self-pay

## 2020-04-26 ENCOUNTER — Ambulatory Visit: Payer: Federal, State, Local not specified - PPO | Attending: Internal Medicine

## 2020-04-26 DIAGNOSIS — Z23 Encounter for immunization: Secondary | ICD-10-CM

## 2020-04-26 NOTE — Progress Notes (Signed)
   Covid-19 Vaccination Clinic  Name:  [name]    MRN: [id number] DOB: Sep 02, 1971  04/26/2020  [name] was observed post Covid-19 immunization for 15 minutes without incident. She was provided with Vaccine Information Sheet and instruction to access the V-Safe system.   [name] was instructed to call 911 with any severe reactions post vaccine: Marland Kitchen Difficulty breathing  . Swelling of face and throat  . A fast heartbeat  . A bad rash all over body  . Dizziness and weakness   Immunizations Administered    Name Date Dose VIS Date Route   Pfizer COVID-19 Vaccine 04/26/2020 11:47 AM 0.3 mL 02/11/2019 Intramuscular   Manufacturer: [place]   Lot: [id number]   [place]: [id number]

## 2021-02-07 ENCOUNTER — Emergency Department (HOSPITAL_COMMUNITY): Payer: Federal, State, Local not specified - PPO

## 2021-02-07 ENCOUNTER — Other Ambulatory Visit: Payer: Self-pay

## 2021-02-07 ENCOUNTER — Inpatient Hospital Stay (HOSPITAL_COMMUNITY)
Admission: EM | Admit: 2021-02-07 | Discharge: 2021-02-10 | DRG: 177 | Disposition: A | Discharge status: Home / Self Care | Payer: Federal, State, Local not specified - PPO | Attending: Internal Medicine | Admitting: Internal Medicine

## 2021-02-07 ENCOUNTER — Encounter (HOSPITAL_COMMUNITY): Payer: Self-pay | Admitting: Internal Medicine

## 2021-02-07 DIAGNOSIS — Z833 Family history of diabetes mellitus: Secondary | ICD-10-CM | POA: Diagnosis not present

## 2021-02-07 DIAGNOSIS — Z8249 Family history of ischemic heart disease and other diseases of the circulatory system: Secondary | ICD-10-CM

## 2021-02-07 DIAGNOSIS — I110 Hypertensive heart disease with heart failure: Secondary | ICD-10-CM | POA: Diagnosis present

## 2021-02-07 DIAGNOSIS — J1282 Pneumonia due to coronavirus disease 2019: Secondary | ICD-10-CM | POA: Diagnosis present

## 2021-02-07 DIAGNOSIS — F319 Bipolar disorder, unspecified: Secondary | ICD-10-CM | POA: Diagnosis present

## 2021-02-07 DIAGNOSIS — J069 Acute upper respiratory infection, unspecified: Secondary | ICD-10-CM | POA: Diagnosis not present

## 2021-02-07 DIAGNOSIS — E039 Hypothyroidism, unspecified: Secondary | ICD-10-CM | POA: Diagnosis present

## 2021-02-07 DIAGNOSIS — E662 Morbid (severe) obesity with alveolar hypoventilation: Secondary | ICD-10-CM | POA: Diagnosis present

## 2021-02-07 DIAGNOSIS — E119 Type 2 diabetes mellitus without complications: Secondary | ICD-10-CM

## 2021-02-07 DIAGNOSIS — J440 Chronic obstructive pulmonary disease with acute lower respiratory infection: Secondary | ICD-10-CM | POA: Diagnosis present

## 2021-02-07 DIAGNOSIS — F3110 Bipolar disorder, current episode manic without psychotic features, unspecified: Secondary | ICD-10-CM | POA: Diagnosis present

## 2021-02-07 DIAGNOSIS — Z79899 Other long term (current) drug therapy: Secondary | ICD-10-CM | POA: Diagnosis not present

## 2021-02-07 DIAGNOSIS — J9601 Acute respiratory failure with hypoxia: Secondary | ICD-10-CM | POA: Diagnosis present

## 2021-02-07 DIAGNOSIS — U071 COVID-19: Secondary | ICD-10-CM | POA: Diagnosis not present

## 2021-02-07 DIAGNOSIS — I10 Essential (primary) hypertension: Secondary | ICD-10-CM | POA: Diagnosis present

## 2021-02-07 DIAGNOSIS — J449 Chronic obstructive pulmonary disease, unspecified: Secondary | ICD-10-CM | POA: Diagnosis present

## 2021-02-07 DIAGNOSIS — Z9981 Dependence on supplemental oxygen: Secondary | ICD-10-CM | POA: Diagnosis not present

## 2021-02-07 DIAGNOSIS — R0902 Hypoxemia: Secondary | ICD-10-CM | POA: Diagnosis present

## 2021-02-07 DIAGNOSIS — Z8701 Personal history of pneumonia (recurrent): Secondary | ICD-10-CM | POA: Diagnosis not present

## 2021-02-07 DIAGNOSIS — Z7984 Long term (current) use of oral hypoglycemic drugs: Secondary | ICD-10-CM

## 2021-02-07 DIAGNOSIS — E785 Hyperlipidemia, unspecified: Secondary | ICD-10-CM | POA: Diagnosis present

## 2021-02-07 DIAGNOSIS — Z79890 Hormone replacement therapy (postmenopausal): Secondary | ICD-10-CM | POA: Diagnosis not present

## 2021-02-07 DIAGNOSIS — Z6835 Body mass index (BMI) 35.0-35.9, adult: Secondary | ICD-10-CM | POA: Diagnosis not present

## 2021-02-07 DIAGNOSIS — Z83438 Family history of other disorder of lipoprotein metabolism and other lipidemia: Secondary | ICD-10-CM | POA: Diagnosis not present

## 2021-02-07 DIAGNOSIS — T380X5A Adverse effect of glucocorticoids and synthetic analogues, initial encounter: Secondary | ICD-10-CM | POA: Diagnosis present

## 2021-02-07 DIAGNOSIS — I5032 Chronic diastolic (congestive) heart failure: Secondary | ICD-10-CM | POA: Diagnosis present

## 2021-02-07 DIAGNOSIS — E1165 Type 2 diabetes mellitus with hyperglycemia: Secondary | ICD-10-CM | POA: Diagnosis present

## 2021-02-07 LAB — HEPATIC FUNCTION PANEL
ALT: 13 U/L (ref 0–44)
AST: 16 U/L (ref 15–41)
Albumin: 3.1 g/dL — ABNORMAL LOW (ref 3.5–5.0)
Alkaline Phosphatase: 109 U/L (ref 38–126)
Bilirubin, Direct: 0.1 mg/dL (ref 0.0–0.2)
Indirect Bilirubin: 0.8 mg/dL (ref 0.3–0.9)
Total Bilirubin: 0.9 mg/dL (ref 0.3–1.2)
Total Protein: 6.8 g/dL (ref 6.5–8.1)

## 2021-02-07 LAB — BASIC METABOLIC PANEL
Anion gap: 11 (ref 5–15)
BUN: 6 mg/dL (ref 6–20)
CO2: 28 mmol/L (ref 22–32)
Calcium: 9.2 mg/dL (ref 8.9–10.3)
Chloride: 96 mmol/L — ABNORMAL LOW (ref 98–111)
Creatinine, Ser: 0.61 mg/dL (ref 0.44–1.00)
GFR, Estimated: >60 mL/min (ref >60)
Glucose, Bld: 451 mg/dL — ABNORMAL HIGH (ref 70–99)
Potassium: 4 mmol/L (ref 3.5–5.1)
Sodium: 135 mmol/L (ref 135–145)

## 2021-02-07 LAB — HEMOGLOBIN A1C
Hgb A1c MFr Bld: 12.4 % — ABNORMAL HIGH (ref 4.8–5.6)
Mean Plasma Glucose: 309.18 mg/dL

## 2021-02-07 LAB — CBG MONITORING, ED: Glucose-Capillary: 329 mg/dL — ABNORMAL HIGH (ref 70–99)

## 2021-02-07 LAB — I-STAT BETA HCG BLOOD, ED (MC, WL, AP ONLY): I-stat hCG, quantitative: <5 m[IU]/mL (ref <5)

## 2021-02-07 LAB — CBC
HCT: 44.5 % (ref 36.0–46.0)
Hemoglobin: 13.9 g/dL (ref 12.0–15.0)
MCH: 26.7 pg (ref 26.0–34.0)
MCHC: 31.2 g/dL (ref 30.0–36.0)
MCV: 85.6 fL (ref 80.0–100.0)
Platelets: 320 10*3/uL (ref 150–400)
RBC: 5.2 MIL/uL — ABNORMAL HIGH (ref 3.87–5.11)
RDW: 13.8 % (ref 11.5–15.5)
WBC: 4.8 10*3/uL (ref 4.0–10.5)
nRBC: 0 % (ref 0.0–0.2)

## 2021-02-07 LAB — TROPONIN I (HIGH SENSITIVITY)
Troponin I (High Sensitivity): 8 ng/L (ref <18)
Troponin I (High Sensitivity): 6 ng/L (ref <18)

## 2021-02-07 LAB — C-REACTIVE PROTEIN: CRP: 4.3 mg/dL — ABNORMAL HIGH (ref <1.0)

## 2021-02-07 LAB — POC SARS CORONAVIRUS 2 AG -  ED: SARS Coronavirus 2 Ag: POSITIVE — AB

## 2021-02-07 LAB — FERRITIN: Ferritin: 172 ng/mL (ref 11–307)

## 2021-02-07 LAB — GLUCOSE, CAPILLARY: Glucose-Capillary: 332 mg/dL — ABNORMAL HIGH (ref 70–99)

## 2021-02-07 LAB — LACTATE DEHYDROGENASE: LDH: 207 U/L — ABNORMAL HIGH (ref 98–192)

## 2021-02-07 LAB — D-DIMER, QUANTITATIVE: D-Dimer, Quant: 0.41 ug/mL-FEU (ref 0.00–0.50)

## 2021-02-07 LAB — PROCALCITONIN: Procalcitonin: <0.1 ng/mL

## 2021-02-07 LAB — BRAIN NATRIURETIC PEPTIDE: B Natriuretic Peptide: 20.1 pg/mL (ref 0.0–100.0)

## 2021-02-07 LAB — FIBRINOGEN: Fibrinogen: 625 mg/dL — ABNORMAL HIGH (ref 210–475)

## 2021-02-07 LAB — HIV ANTIBODY (ROUTINE TESTING W REFLEX): HIV Screen 4th Generation wRfx: NONREACTIVE

## 2021-02-07 MED ORDER — SODIUM CHLORIDE 0.9% FLUSH
3.0000 mL | Freq: Two times a day (BID) | INTRAVENOUS | Status: DC
  Administered 2021-02-10: 3 mL via INTRAVENOUS

## 2021-02-07 MED ORDER — METHYLPREDNISOLONE SODIUM SUCC 125 MG IJ SOLR
80.0000 mg | Freq: Once | INTRAMUSCULAR | Status: DC
  Filled 2021-02-07: qty 2

## 2021-02-07 MED ORDER — INSULIN ASPART 100 UNIT/ML ~~LOC~~ SOLN: 3.0000 [IU] | Freq: Once | SUBCUTANEOUS | Status: DC

## 2021-02-07 MED ORDER — INSULIN ASPART 100 UNIT/ML ~~LOC~~ SOLN
0.0000 [IU] | Freq: Three times a day (TID) | SUBCUTANEOUS | Status: DC
  Administered 2021-02-07: 11 [IU] via SUBCUTANEOUS
  Administered 2021-02-08 (×2): 15 [IU] via SUBCUTANEOUS
  Administered 2021-02-08 – 2021-02-09 (×2): 11 [IU] via SUBCUTANEOUS
  Administered 2021-02-09: 3 [IU] via SUBCUTANEOUS
  Administered 2021-02-09: 15 [IU] via SUBCUTANEOUS
  Administered 2021-02-10: 8 [IU] via SUBCUTANEOUS

## 2021-02-07 MED ORDER — PREDNISONE 5 MG PO TABS: 50.0000 mg | ORAL_TABLET | Freq: Every day | ORAL | Status: DC

## 2021-02-07 MED ORDER — METHYLPREDNISOLONE SODIUM SUCC 40 MG IJ SOLR
0.5000 mg/kg | Freq: Two times a day (BID) | INTRAMUSCULAR | Status: DC
  Administered 2021-02-07 – 2021-02-09 (×4): 37.6 mg via INTRAVENOUS
  Filled 2021-02-07 (×4): qty 1

## 2021-02-07 MED ORDER — ASCORBIC ACID 500 MG PO TABS
500.0000 mg | ORAL_TABLET | Freq: Every day | ORAL | Status: DC
  Administered 2021-02-07 – 2021-02-10 (×4): 500 mg via ORAL
  Filled 2021-02-07 (×4): qty 1

## 2021-02-07 MED ORDER — GUAIFENESIN-DM 100-10 MG/5ML PO SYRP
10.0000 mL | ORAL_SOLUTION | ORAL | Status: DC | PRN
Start: 2021-02-07 — End: 2021-02-10
  Administered 2021-02-08 – 2021-02-10 (×3): 10 mL via ORAL
  Filled 2021-02-07 (×3): qty 10

## 2021-02-07 MED ORDER — LAMOTRIGINE 100 MG PO TABS
100.0000 mg | ORAL_TABLET | Freq: Every evening | ORAL | Status: DC
  Administered 2021-02-07 – 2021-02-09 (×3): 100 mg via ORAL
  Filled 2021-02-07 (×3): qty 1

## 2021-02-07 MED ORDER — INSULIN ASPART 100 UNIT/ML ~~LOC~~ SOLN
4.0000 [IU] | Freq: Three times a day (TID) | SUBCUTANEOUS | Status: DC
  Administered 2021-02-07 – 2021-02-08 (×3): 4 [IU] via SUBCUTANEOUS

## 2021-02-07 MED ORDER — INSULIN ASPART 100 UNIT/ML ~~LOC~~ SOLN
0.0000 [IU] | Freq: Every day | SUBCUTANEOUS | Status: DC
  Administered 2021-02-07: 4 [IU] via SUBCUTANEOUS
  Administered 2021-02-08: 5 [IU] via SUBCUTANEOUS
  Administered 2021-02-09: 4 [IU] via SUBCUTANEOUS

## 2021-02-07 MED ORDER — SODIUM CHLORIDE 0.9% FLUSH
3.0000 mL | Freq: Two times a day (BID) | INTRAVENOUS | Status: DC
  Administered 2021-02-07 – 2021-02-10 (×6): 3 mL via INTRAVENOUS

## 2021-02-07 MED ORDER — HYDROCOD POLST-CPM POLST ER 10-8 MG/5ML PO SUER: 5.0000 mL | Freq: Two times a day (BID) | ORAL | Status: DC | PRN

## 2021-02-07 MED ORDER — BISACODYL 5 MG PO TBEC: 5.0000 mg | DELAYED_RELEASE_TABLET | Freq: Every day | ORAL | Status: DC | PRN

## 2021-02-07 MED ORDER — SODIUM CHLORIDE 0.9 % IV SOLN: 250.0000 mL | INTRAVENOUS | Status: DC | PRN

## 2021-02-07 MED ORDER — ZINC SULFATE 220 (50 ZN) MG PO CAPS
220.0000 mg | ORAL_CAPSULE | Freq: Every day | ORAL | Status: DC
  Administered 2021-02-07 – 2021-02-10 (×4): 220 mg via ORAL
  Filled 2021-02-07 (×4): qty 1

## 2021-02-07 MED ORDER — ONDANSETRON HCL 4 MG PO TABS: 4.0000 mg | ORAL_TABLET | Freq: Four times a day (QID) | ORAL | Status: DC | PRN

## 2021-02-07 MED ORDER — ENOXAPARIN SODIUM 40 MG/0.4ML ~~LOC~~ SOLN
40.0000 mg | SUBCUTANEOUS | Status: DC
  Administered 2021-02-07 – 2021-02-09 (×3): 40 mg via SUBCUTANEOUS
  Filled 2021-02-07 (×3): qty 0.4

## 2021-02-07 MED ORDER — FUROSEMIDE 10 MG/ML IJ SOLN: 20.0000 mg | Freq: Once | INTRAMUSCULAR | Status: DC

## 2021-02-07 MED ORDER — AMLODIPINE BESYLATE 10 MG PO TABS
10.0000 mg | ORAL_TABLET | Freq: Every day | ORAL | Status: DC
  Administered 2021-02-08 – 2021-02-10 (×3): 10 mg via ORAL
  Filled 2021-02-07 (×3): qty 1

## 2021-02-07 MED ORDER — ALBUTEROL SULFATE HFA 108 (90 BASE) MCG/ACT IN AERS
1.0000 | INHALATION_SPRAY | Freq: Once | RESPIRATORY_TRACT | Status: AC
  Administered 2021-02-07: 2 via RESPIRATORY_TRACT
  Filled 2021-02-07: qty 6.7

## 2021-02-07 MED ORDER — FLEET ENEMA 7-19 GM/118ML RE ENEM: 1.0000 | ENEMA | Freq: Once | RECTAL | Status: DC | PRN

## 2021-02-07 MED ORDER — ACETAMINOPHEN 325 MG PO TABS
650.0000 mg | ORAL_TABLET | Freq: Four times a day (QID) | ORAL | Status: DC | PRN
  Administered 2021-02-08 – 2021-02-10 (×3): 650 mg via ORAL
  Filled 2021-02-07 (×3): qty 2

## 2021-02-07 MED ORDER — ALBUTEROL SULFATE HFA 108 (90 BASE) MCG/ACT IN AERS
2.0000 | INHALATION_SPRAY | RESPIRATORY_TRACT | Status: DC | PRN
  Filled 2021-02-07: qty 6.7

## 2021-02-07 MED ORDER — OXYCODONE HCL 5 MG PO TABS: 5.0000 mg | ORAL_TABLET | ORAL | Status: DC | PRN

## 2021-02-07 MED ORDER — SODIUM CHLORIDE 0.9% FLUSH: 3.0000 mL | INTRAVENOUS | Status: DC | PRN

## 2021-02-07 MED ORDER — IRBESARTAN 300 MG PO TABS
300.0000 mg | ORAL_TABLET | Freq: Every day | ORAL | Status: DC
  Administered 2021-02-08 – 2021-02-10 (×3): 300 mg via ORAL
  Filled 2021-02-07 (×3): qty 1

## 2021-02-07 MED ORDER — SODIUM CHLORIDE 0.9 % IV SOLN
200.0000 mg | Freq: Once | INTRAVENOUS | Status: AC
  Administered 2021-02-07: 200 mg via INTRAVENOUS
  Filled 2021-02-07: qty 40

## 2021-02-07 MED ORDER — INSULIN GLARGINE 100 UNIT/ML ~~LOC~~ SOLN
10.0000 [IU] | Freq: Every day | SUBCUTANEOUS | Status: DC
  Administered 2021-02-07: 10 [IU] via SUBCUTANEOUS
  Filled 2021-02-07 (×2): qty 0.1

## 2021-02-07 MED ORDER — SODIUM CHLORIDE 0.9 % IV SOLN
100.0000 mg | Freq: Every day | INTRAVENOUS | Status: DC
  Administered 2021-02-08 – 2021-02-10 (×3): 100 mg via INTRAVENOUS
  Filled 2021-02-07 (×3): qty 20

## 2021-02-07 MED ORDER — DOCUSATE SODIUM 100 MG PO CAPS
100.0000 mg | ORAL_CAPSULE | Freq: Two times a day (BID) | ORAL | Status: DC
  Administered 2021-02-07 – 2021-02-10 (×6): 100 mg via ORAL
  Filled 2021-02-07 (×6): qty 1

## 2021-02-07 MED ORDER — INFLUENZA VAC SPLIT QUAD 0.5 ML IM SUSY: 0.5000 mL | PREFILLED_SYRINGE | INTRAMUSCULAR | Status: DC | PRN

## 2021-02-07 MED ORDER — ONDANSETRON HCL 4 MG/2ML IJ SOLN: 4.0000 mg | Freq: Four times a day (QID) | INTRAMUSCULAR | Status: DC | PRN

## 2021-02-07 MED ORDER — POLYETHYLENE GLYCOL 3350 17 G PO PACK
17.0000 g | PACK | Freq: Every day | ORAL | Status: DC | PRN
  Administered 2021-02-09: 17 g via ORAL
  Filled 2021-02-07: qty 1

## 2021-02-07 MED ORDER — DIVALPROEX SODIUM ER 500 MG PO TB24
500.0000 mg | ORAL_TABLET | Freq: Every day | ORAL | Status: DC
  Administered 2021-02-07 – 2021-02-09 (×3): 500 mg via ORAL
  Filled 2021-02-07 (×3): qty 1

## 2021-02-07 MED ORDER — LEVOTHYROXINE SODIUM 50 MCG PO TABS
50.0000 ug | ORAL_TABLET | Freq: Every day | ORAL | Status: DC
  Administered 2021-02-08 – 2021-02-10 (×3): 50 ug via ORAL
  Filled 2021-02-07 (×3): qty 1

## 2021-02-07 NOTE — Plan of Care (Signed)
  Problem: Education: Goal: Knowledge of risk factors and measures for prevention of condition will improve Outcome: Progressing   

## 2021-02-07 NOTE — H&P (Signed)
History and Physical    [name] ZOX:[id number] DOB: March 26, 1971 DOA: 02/07/2021  PCP: Glendon Axe, MD Consultants:  None Patient coming from:  Home - lives with husband and 2 young adult children; NOK: Dayly[place], [place], [phone]; [phone]  Chief Complaint: SOB  HPI: [name] is a 51 y.o. female with medical history significant of OHS; HTN; HLD; DM; bipolar; COPD; and chronic diastolic CHF presenting with SOB.  She reports several days of SOB, mild cough, and hypoxia.  She has h/o O2 dependent COPD but was taken off [place] O2 in 08/2020.  She began feeling SOB and so started checking her O2 sats and found them in the 70s.  She started wearing 2L [place] O2.  She is vaccinated x 2 against COVID but has not received her booster.  No GI symptoms.    ED Course:  Previously on 2L home O2, stopped in 08/2020.  Vaccinated x 2.  SOB/cough x 2 days.  73% on RA last night, increased to mid-80s on 2L.  Currently better on 2L, very SOB with ambulation.  COVID +.    Review of Systems: As per HPI; otherwise review of systems reviewed and negative.   Ambulatory Status:  Ambulates without assistance  COVID Vaccine Status:  Complete, no booster  Past Medical History:  Diagnosis Date  . Anemia microcytic 03/31/2013  . Bipolar disorder ([place]) 03/31/2013  . CHF (congestive heart failure) ([place])   . Diabetes mellitus without complication ([place])   . Hyperlipidemia 03/31/2013  . Hypertension   . Obesity hypoventilation syndrome ([place])   . Plantar fasciitis of left foot   . Pneumonia   . Pneumothorax   . Pulmonary infiltrates   . Rhabdomyolysis 03/31/2013   [place] secondary to abilify  . Vitamin D deficiency 03/31/2013    Past Surgical History:  Procedure Laterality Date  . CESAREAN SECTION     x2  . CYST EXCISION    . DILATATION & CURETTAGE/HYSTEROSCOPY WITH MYOSURE N/A 02/17/2016   Procedure: DILATATION & CURETTAGE/HYSTEROSCOPY/Myomectomy WITH MYOSURE;  Surgeon: Aloha Gell, MD;   Location: [facility];  Service: Gynecology;  Laterality: N/A;  . IR ANGIOGRAM PELVIS SELECTIVE OR SUPRASELECTIVE  03/29/2017  . IR ANGIOGRAM PELVIS SELECTIVE OR SUPRASELECTIVE  03/29/2017  . IR ANGIOGRAM SELECTIVE EACH ADDITIONAL VESSEL  03/29/2017  . IR ANGIOGRAM SELECTIVE EACH ADDITIONAL VESSEL  03/29/2017  . IR EMBO TUMOR ORGAN ISCHEMIA INFARCT INC GUIDE ROADMAPPING  03/29/2017  . IR GENERIC HISTORICAL  11/14/2016   IR RADIOLOGIST EVAL & MGMT 11/14/2016 Greggory Keen, MD [facility] INTERV RAD  . IR RADIOLOGIST EVAL & MGMT  05/16/2017  . IR US GUIDE VASC ACCESS RIGHT  03/29/2017  . MYOMECTOMY N/A 02/17/2016   Procedure: Amado Coe WITH Jacklynn Barnacle;  Surgeon: Aloha Gell, MD;  Location: [facility];  Service: Gynecology;  Laterality: N/A;  . VENTRICULOPERITONEAL SHUNT      Social History   Socioeconomic History  . Marital status: Married    Spouse name: Not on file  . Number of children: 2  . Years of education: Not on file  . Highest education level: Not on file  Occupational History  . Occupation: Public affairs consultant: [employer]  Tobacco Use  . Smoking status: Never Smoker  . Smokeless tobacco: Never Used  Substance and Sexual Activity  . Alcohol use: No  . Drug use: No  . Sexual activity: Yes    Birth control/protection: None  Other Topics Concern  . Not on file  Social History Narrative  .  Not on file   Social Determinants of Health   Financial Resource Strain: Not on file  Food Insecurity: Not on file  Transportation Needs: Not on file  Physical Activity: Not on file  Stress: Not on file  Social Connections: Not on file  Intimate Partner Violence: Not on file    Allergies  Allergen Reactions  . Abilify [Aripiprazole] Other (See Comments)    Reaction:  Hyperglycemia    Family History  Problem Relation Age of Onset  . Diabetes Mother   . Hyperlipidemia Mother   . Hypertension Mother   . Lung cancer Father   . Hypertension Brother     Prior to Admission  medications   Medication Sig Start Date End Date Taking? Authorizing Provider  amLODipine (NORVASC) 10 MG tablet Take 10 mg by mouth daily. 08/23/19   [provider]  divalproex (DEPAKOTE ER) 500 MG 24 hr tablet Take 500 mg by mouth daily.     [provider]  ezetimibe (ZETIA) 10 MG tablet Take 10 mg by mouth daily. 08/11/19   [provider]  FeFum-FePoly-FA-B Cmp-C-Biot (INTEGRA PLUS) CAPS Take 1 capsule by mouth every evening.     [provider]  irbesartan (AVAPRO) 300 MG tablet Take 300 mg by mouth daily.    [provider]  lamoTRIgine (LAMICTAL) 200 MG tablet Take 100 mg by mouth daily.     [provider]  levothyroxine (SYNTHROID, LEVOTHROID) 50 MCG tablet Take 50 mcg by mouth daily before breakfast.    [provider]  metFORMIN (GLUCOPHAGE) 1000 MG tablet Take 1,000 mg by mouth 2 (two) times daily with a meal.    [provider]  TRULICITY 1.5 QA/8.3MH SOPN Inject 1.5 mg into the skin every 7 (seven) days. 04/21/19   [provider]    Physical Exam: Vitals:   02/07/21 1715 02/07/21 1730 02/07/21 1745 02/07/21 1807  BP: 139/86 132/87 (!) 142/77   Pulse: 72 73 73   Resp: 20 (!) 24 20   Temp:    99.1 F (37.3 C)  TempSrc:    Oral  SpO2: 100% 99% 99%   Weight:      Height:         . General:  Appears calm and comfortable and is in NAD . Eyes:  PERRL, EOMI, normal lids, iris . ENT:  grossly normal hearing, lips & tongue, mmm . Neck:  no LAD, masses or thyromegaly . Cardiovascular:  RRR, no m/r/g. No LE edema.  Marland Kitchen Respiratory:   Scattered rhonchi.  Normal respiratory effort on 2L [place] O2. . Abdomen:  soft, NT, ND, NABS . Back:   normal alignment, no CVAT . Skin:  no rash or induration seen on limited exam . Musculoskeletal:  grossly normal tone BUE/BLE, good ROM, no bony abnormality . Lower extremity:  No LE edema.  Limited foot exam with no ulcerations.  2+ distal pulses. Marland Kitchen Psychiatric:  grossly  normal mood and affect, speech fluent and appropriate, AOx3 . Neurologic:  CN 2-12 grossly intact, moves all extremities in coordinated fashion    Radiological Exams on Admission: Independently reviewed - see discussion in A/P where applicable  DG Chest 2 View  Result Date: 02/07/2021 CLINICAL DATA:  Shortness of breath.  Decreased oxygen saturation. EXAM: CHEST - 2 VIEW COMPARISON:  PA and lateral chest 10/13/2019. FINDINGS: Airspace disease in the right middle lobe and lingula appears worse in the right middle lobe. No pneumothorax or pleural effusion. Heart size is enlarged.  No acute or focal bony abnormality. 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 IMPRESSION: Airspace disease in the right middle lobe and lingula worrisome for pneumonia. Cardiomegaly. Electronically Signed   By: Inge Rise M.D.   On: 02/07/2021 12:29    EKG: Independently reviewed.  NSR with rate 75; nonspecific ST changes with no evidence of acute ischemia   Labs on Admission: I have personally reviewed the available labs and imaging studies at the time of the admission.  Pertinent labs:   Glucose 451 HS troponin 8, 6 WBC 4.8 HCG negative COVID POSITIVE   Assessment/Plan Principal Problem:   Acute respiratory disease due to COVID-19 virus Active Problems:   Bipolar I disorder, most recent episode (or current) manic (HCC)   HTN (hypertension)   Hyperlipidemia   Diabetes (HCC)   Diastolic CHF, chronic (HCC)   Obesity hypoventilation syndrome (HCC)   COPD without exacerbation (HCC)   Hypothyroidism   Acute respiratory failure with hypoxia due to COVID-19 PNA -Patient with presenting with SOB and cough productive of clear sputum -Mild anorexia noted without the presence of other GI symptoms -She does not have a usual current home O2 requirement and is currently requiring 2L [place] O2 -COVID POSITIVE -The patient has comorbidities which may increase the risk for ARDS/MODS including:  HTN, DM,  obesity, COPD, CHF -Pertinent labs concerning for COVID include normal WBC count; increased LDH - remaining COVID labs are pending -CXR with multifocal opacities which may be c/w COVID vs. Multifocal PNA  -Will not treat with broad-spectrum antibiotics if procalcitonin <0.5 -Will admit for further evaluation, close monitoring, and treatment -Monitor on telemetry x at least 24 hours -At this time, will attempt to avoid use of aerosolized medications and use HFAs instead -Will check daily labs including BMP with Mag, Phos; LFTs; CBC with differential; CRP; ferritin; fibrinogen; D-dimer -Will order steroids and Remdesivir (pharmacy consult) given +COVID test, +CXR -If the patient shows clinical deterioration, consider transfer to ICU with PCCM consultation -Consider IL-6 agonist (Actemra) and/or JAK inhibitor (baricitinib) if the patient does not stabilize on current treatment or if the patient has marked clinical decompensation; the patient does not appear to require this treatment at this time but has been consented and agrees to receive treatment if there is evidence of clinical worsening despite current treatments. -Will attempt to maintain euvolemia to a net negative fluid status -Will ask the patient to maintain an awake prone position as much as possible -PT/OT consults -Encourage mobilization/ambulation as much as possible -Patient was seen wearing full PPE including: gown, gloves, head cover, N95, and face shield; donning and doffing was in compliance with current standards.  COPD -No longer on home O2 for this issue -Does not appear to be on any meds including inhalers at this time  Class 2 obesity with OHS -Body mass index is 35.92 kg/m..  -Weight loss should be encouraged -Outpatient PCP/bariatric medicine f/u encouraged  Bipolar d/o -Continue Lamictal, Depakote  DM -Will check A1c -May utilize continuous glucose monitoring -hold Trulicity, Glucophage -Will give 10 units  Lantus starting tonight -Cover with moderate-scale SSI  HTN -Continue Norvasc, Avapro  Chronic diastolic CHF -92/1194 echo with grade 2 diastolic dysfunction -Appears to be compensated at this time -Will follow  Hypothyroidism -Check TSH -Continue Synthroid at current dose for now     Level of care: Telemetry Medical DVT prophylaxis:  Lovenox  Code Status:  Full - confirmed with patient Family Communication: None present; I spoke with the patient's husnband by telephone. Disposition  Plan:  The patient is from: home  Anticipated d/c is to: home without [facility] services once her respiratory issues have been resolved.  She may require home O2 at the time of discharge.  Anticipated d/c date will depend on clinical response to treatment, likely between 3 days (with completion of outpatient Remdesivir treatment) and 5 days  Patient is currently: acutely ill Consults called: PT/OT  Admission status: Admit - It is my clinical opinion that admission to [place] is reasonable and necessary because of the expectation that this patient will require hospital care that crosses at least 2 midnights to treat this condition based on the medical complexity of the problems presented.  Given the aforementioned information, the predictability of an adverse outcome is felt to be significant.     Karmen Bongo MD [facility]   How to contact the [facility] Attending or Consulting provider [place] or covering provider during after hours [place], for this patient?  1. Check the care team in [facility] and look for a) attending/consulting [facility] provider listed and b) the [facility] team listed 2. Log into [email or web address] and use [place]'s universal password to access. If you do not have the password, please contact the hospital operator. 3. Locate the [facility] provider you are looking for under [facility] and page to a number that you can be directly reached. 4. If you still have difficulty reaching the provider,  please page the [facility] (Director on Call) for the Hospitalists listed on amion for assistance.   02/07/2021, 6:27 PM

## 2021-02-07 NOTE — ED Triage Notes (Signed)
Pt arrives to ED pov with chief complaint of sob and low sats at home, she has hx of COPD and has been off of her home o2 since sept but yesterday she began to have sob and checked her o2 and was 70%. She arrives to ED on 2L and is 98%.

## 2021-02-07 NOTE — Medical Student Note (Signed)
[facility] DEPT Provider Student Note For educational purposes for Medical, PA and NP students only and not part of the legal medical record.   CSN: [id number] Arrival date & time: 02/07/21  1138      History   Chief Complaint Chief Complaint  Patient presents with  . Shortness of Breath    HPI [name] is a 50 y.o. female with PMHx of CHF, T2DM, HTN, COPD who presents with shortness of breath.  States that over the past week she has been feeling increasingly short of breath. States that she has been off her oxygen since September of 2021 at the direction of her pulmonologist from [place] to check her home O2 today. States that it was initially 73% on RA and increased to 83% and eventually lower 90s on [facility] using her home oxygen machine. She called PCP who told her to present to ED. Endorses increasing shortness of breath for a week with increasing cough. Endorses clear sputum. States she wears CPAP at night but has been sleeping on more pillows over the past week. States when she takes a deep breath this induces cough. No alleviating factors. Denies sick contacts or known exposures. Denies fevers, N/V/D or abdominal pain. Denies leg swelling. She received 2 Pfizer vaccines. No booster. Last dose was May 2021.   Past Medical History:  Diagnosis Date  . Anemia microcytic 03/31/2013  . Bipolar disorder ([place]) 03/31/2013  . CHF (congestive heart failure) ([place])   . Diabetes mellitus without complication ([place])   . Hyperlipidemia 03/31/2013  . Hypertension   . Obesity hypoventilation syndrome ([place])   . Plantar fasciitis of left foot   . Pneumonia   . Pneumothorax   . Pulmonary infiltrates   . Rhabdomyolysis 03/31/2013   [place] secondary to abilify  . Vitamin D deficiency 03/31/2013    Patient Active Problem List   Diagnosis Date Noted  . Acute respiratory failure with hypoxia ([place]) 10/14/2019  . Pulmonary artery hypertension ([place]) 10/14/2019  .  Hypothyroidism 10/14/2019  . Fibroids 03/29/2017  . 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 SOB (shortness of breath) on exertion   . COPD with acute exacerbation ([place]) 09/06/2016  . Bipolar disorder ([place]) 09/06/2016  . Abnormal CT scan, chest 04/04/2016  . Morbid obesity ([place]) 03/14/2016  . Hypoxemia   . Pneumothorax   . Urinary tract infection, site not specified   . Chronic diastolic CHF (congestive heart failure) ([place])   . Type 2 diabetes mellitus with complication ([place])   . Essential hypertension   . Affective psychosis, bipolar ([place])   . Uterine leiomyoma   . Acute respiratory failure ([place]) 02/17/2016  . Hypoxemic respiratory failure, chronic ([place]) 02/17/2016  . Diastolic CHF, chronic ([place]) 04/02/2013  . Obesity hypoventilation syndrome ([place]) 04/02/2013  . Abnormal EKG 03/31/2013  . Leukocytosis 03/31/2013  . HTN (hypertension) 03/31/2013  . Hyperlipidemia 03/31/2013  . Diabetes ([place]) 03/31/2013  . Vitamin D deficiency 03/31/2013  . Anemia 03/31/2013  . Bipolar I disorder, most recent episode (or current) manic ([place]) 03/17/2013    Past Surgical History:  Procedure Laterality Date  . CESAREAN SECTION     x2  . CYST EXCISION    . DILATATION & CURETTAGE/HYSTEROSCOPY WITH MYOSURE N/A 02/17/2016   Procedure: DILATATION & CURETTAGE/HYSTEROSCOPY/Myomectomy WITH MYOSURE;  Surgeon: Aloha Gell, MD;  Location: [facility];  Service: Gynecology;  Laterality: N/A;  . IR ANGIOGRAM PELVIS SELECTIVE OR SUPRASELECTIVE  03/29/2017  . IR ANGIOGRAM PELVIS SELECTIVE OR SUPRASELECTIVE  03/29/2017  .  IR ANGIOGRAM SELECTIVE EACH ADDITIONAL VESSEL  03/29/2017  . IR ANGIOGRAM SELECTIVE EACH ADDITIONAL VESSEL  03/29/2017  . IR EMBO TUMOR ORGAN ISCHEMIA INFARCT INC GUIDE ROADMAPPING  03/29/2017  . IR GENERIC HISTORICAL  11/14/2016   IR RADIOLOGIST EVAL & MGMT 11/14/2016 Greggory Keen, MD [facility] INTERV RAD  . IR RADIOLOGIST EVAL & MGMT  05/16/2017  . IR US GUIDE VASC ACCESS RIGHT  03/29/2017  .  MYOMECTOMY N/A 02/17/2016   Procedure: Amado Coe WITH Jacklynn Barnacle;  Surgeon: Aloha Gell, MD;  Location: [facility];  Service: Gynecology;  Laterality: N/A;  . VENTRICULOPERITONEAL SHUNT      OB History   No obstetric history on file.     Home Medications    Prior to Admission medications   Medication Sig Start Date End Date Taking? Authorizing Provider  amLODipine (NORVASC) 10 MG tablet Take 10 mg by mouth daily. 08/23/19   [provider]  divalproex (DEPAKOTE ER) 500 MG 24 hr tablet Take 500 mg by mouth daily.     [provider]  ezetimibe (ZETIA) 10 MG tablet Take 10 mg by mouth daily. 08/11/19   [provider]  FeFum-FePoly-FA-B Cmp-C-Biot (INTEGRA PLUS) CAPS Take 1 capsule by mouth every evening.     [provider]  irbesartan (AVAPRO) 300 MG tablet Take 300 mg by mouth daily.    [provider]  lamoTRIgine (LAMICTAL) 200 MG tablet Take 100 mg by mouth daily.     [provider]  levothyroxine (SYNTHROID, LEVOTHROID) 50 MCG tablet Take 50 mcg by mouth daily before breakfast.    [provider]  metFORMIN (GLUCOPHAGE) 1000 MG tablet Take 1,000 mg by mouth 2 (two) times daily with a meal.    [provider]  TRULICITY 1.5 GU/5.4YH SOPN Inject 1.5 mg into the skin every 7 (seven) days. 04/21/19   [provider]    Family History Family History  Problem Relation Age of Onset  . Diabetes Mother   . Hyperlipidemia Mother   . Hypertension Mother   . Lung cancer Father   . Hypertension Brother     Social History Social History   Tobacco Use  . Smoking status: Never Smoker  . Smokeless tobacco: Never Used  Substance Use Topics  . Alcohol use: No  . Drug use: No     Allergies   Abilify [aripiprazole]   Review of Systems Review of Systems  Constitutional: Positive for activity change and appetite change. Negative for fever.  HENT: Negative.   Eyes: Negative.   Respiratory: Positive for cough  and shortness of breath.   Cardiovascular: Negative.   Gastrointestinal: Negative.   Endocrine: Negative.   Genitourinary: Negative.   Musculoskeletal: Negative.   Skin: Negative.   Allergic/Immunologic: Negative.   Neurological: Negative.   Hematological: Negative.   Psychiatric/Behavioral: Negative.    Physical Exam Updated Vital Signs BP (!) 141/88 (BP Location: Right Arm)   Pulse 81   Temp (!) 97.5 F (36.4 C) (Oral)   Resp 16   SpO2 97%   Physical Exam Vitals and nursing note reviewed.  Constitutional:      General: She is not in acute distress.    Appearance: She is well-developed. She is not ill-appearing.  HENT:     Head: Normocephalic.     Mouth/Throat:     Mouth: Mucous membranes are moist.  Eyes:     Extraocular Movements: Extraocular movements intact.     Pupils: Pupils are equal, round, and reactive to light.  Cardiovascular:     Rate and Rhythm: Normal rate and regular rhythm.     Heart sounds: No murmur heard.   Pulmonary:     Effort: No respiratory distress.     Breath sounds: Decreased air movement present. Examination of the right-lower field reveals decreased breath sounds and rales. Examination of the left-lower field reveals decreased breath sounds and rales. Decreased breath sounds and rales present.  Chest:     Chest wall: No tenderness.  Abdominal:     General: Bowel sounds are normal.     Palpations: Abdomen is soft.     Tenderness: There is no abdominal tenderness.  Musculoskeletal:     Cervical back: Normal range of motion and neck supple.     Right lower leg: Edema present.     Left lower leg: Edema present.  Skin:    General: Skin is warm and dry.     Capillary Refill: Capillary refill takes less than 2 seconds.  Neurological:     General: No focal deficit present.     Mental Status: She is alert.  Psychiatric:        Mood and Affect: Mood normal.    ED Treatments / Results  Labs (all labs ordered are listed, but only  abnormal results are displayed) Labs Reviewed  BASIC METABOLIC PANEL - Abnormal; Notable for the following components:      Result Value   Chloride 96 (*)    Glucose, Bld 451 (*)    All other components within normal limits  CBC - Abnormal; Notable for the following components:   RBC 5.20 (*)    All other components within normal limits  POC SARS CORONAVIRUS 2 AG -  ED - Abnormal; Notable for the following components:   SARS Coronavirus 2 Ag POSITIVE (*)    All other components within normal limits  BRAIN NATRIURETIC PEPTIDE  HIV ANTIBODY (ROUTINE TESTING W REFLEX)  C-REACTIVE PROTEIN  D-DIMER, QUANTITATIVE  FERRITIN  FIBRINOGEN  LACTATE DEHYDROGENASE  PROCALCITONIN  HEMOGLOBIN A1C  HEPATIC FUNCTION PANEL  CBC WITH DIFFERENTIAL/PLATELET  COMPREHENSIVE METABOLIC PANEL  C-REACTIVE PROTEIN  D-DIMER, QUANTITATIVE  FERRITIN  MAGNESIUM  PHOSPHORUS  I-STAT BETA HCG BLOOD, ED ([facility], [facility], [facility] ONLY)  TROPONIN I (HIGH SENSITIVITY)  TROPONIN I (HIGH SENSITIVITY)   EKG  Radiology DG Chest 2 View  Result Date: 02/07/2021 CLINICAL DATA:  Shortness of breath.  Decreased oxygen saturation. EXAM: CHEST - 2 VIEW COMPARISON:  PA and lateral chest 10/13/2019. FINDINGS: Airspace disease in the right middle lobe and lingula appears worse in the right middle lobe. No pneumothorax or pleural effusion. Heart size is enlarged. No acute or focal bony abnormality. 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 IMPRESSION: Airspace disease in the right middle lobe and lingula worrisome for pneumonia. Cardiomegaly. Electronically Signed   By: Inge Rise M.D.   On: 02/07/2021 12:29   Procedures Procedures (including critical care time)  Medications Ordered in ED Medications  insulin aspart (novoLOG) injection 0-15 Units (has no administration in time range)  enoxaparin (LOVENOX) injection 40 mg (has no administration in time range)  sodium chloride flush (NS) 0.9 % injection 3 mL (has no  administration in time range)  sodium chloride flush (NS) 0.9 % injection 3 mL (has no administration in time range)  sodium chloride flush (NS) 0.9 % injection 3 mL (has no administration in time range)  0.9 %  sodium chloride infusion (has no administration in time range)  albuterol (VENTOLIN HFA) 108 (90 Base)  MCG/ACT inhaler 2 puff (has no administration in time range)  methylPREDNISolone sodium succinate (SOLU-MEDROL) injection 0.5 mg/kg (has no administration in time range)    Followed by  predniSONE (DELTASONE) tablet 50 mg (has no administration in time range)  guaiFENesin-dextromethorphan (ROBITUSSIN DM) 100-10 MG/5ML syrup 10 mL (has no administration in time range)  chlorpheniramine-HYDROcodone (TUSSIONEX) 10-8 MG/5ML suspension 5 mL (has no administration in time range)  ascorbic acid (VITAMIN C) tablet 500 mg (has no administration in time range)  zinc sulfate capsule 220 mg (has no administration in time range)  acetaminophen (TYLENOL) tablet 650 mg (has no administration in time range)  oxyCODONE (Oxy IR/ROXICODONE) immediate release tablet 5 mg (has no administration in time range)  docusate sodium (COLACE) capsule 100 mg (has no administration in time range)  polyethylene glycol (MIRALAX / GLYCOLAX) packet 17 g (has no administration in time range)  bisacodyl (DULCOLAX) EC tablet 5 mg (has no administration in time range)  sodium phosphate (FLEET) 7-19 GM/118ML enema 1 enema (has no administration in time range)  ondansetron (ZOFRAN) tablet 4 mg (has no administration in time range)    Or  ondansetron (ZOFRAN) injection 4 mg (has no administration in time range)  insulin aspart (novoLOG) injection 0-5 Units (has no administration in time range)  insulin aspart (novoLOG) injection 4 Units (has no administration in time range)  remdesivir 200 mg in sodium chloride 0.9% 250 mL IVPB (has no administration in time range)    Followed by  remdesivir 100 mg in sodium chloride 0.9 %  100 mL IVPB (has no administration in time range)  albuterol (VENTOLIN HFA) 108 (90 Base) MCG/ACT inhaler 1-2 puff (2 puffs Inhalation Given 02/07/21 1509)    Initial Impression / Assessment and Plan / ED Course  I have reviewed the triage vital signs and the nursing notes.  Pertinent labs & imaging results that were available during my care of the patient were reviewed by me and considered in my medical decision making (see chart for details).  [name] is a 84yoF with PMH and HPI stated above. Differential diagnosis includes COPD exacerbation, acute on chronic heart failure, viral pneumonia, PE.   Unlikely PE. Wells score 0.  HF unlikely. Patient does not appear to be fluid overloaded on exam or imaging. BNP 20.  Viral Pneumonia  - COVID-19 positive - likely exacerbating COPD.  - given albuterol and prednisone.   Elevated blood glucose  - history of diabetes. Unsure of normal home glucose - 5U insulin given   Final Clinical Impressions(s) / ED Diagnoses   Final diagnoses:  COVID-19  Hypoxia    New Prescriptions New Prescriptions   No medications on file

## 2021-02-07 NOTE — ED Provider Notes (Signed)
[facility] Provider Note   CSN: [id number] Arrival date & time: 02/07/21  1138     History Chief Complaint  Patient presents with  . Shortness of Breath    [name] is a 50 y.o. female with PMH/o CHF, DM, COPD, presents for evaluation of shortness of breath, cough that has been ongoing for the last 3 days.  She states that she had a history of COPD and had been on 2 L of oxygen by her pulmonologist (at [facility]) took her off the 2 L in September 2021.  She states since then, she has not had to use it.  She started noticing some worsening shortness of breath over the last few days and states last night, she took her home oxygen levels and it was 73% on room air.  She reports that she put on her 2 L and that bumped her up to about mid 82s.  She does report some improvement in shortness of breath after the oxygen here but states that symptoms were persisting.  She has had a productive cough that is productive of clear sputum.  No hemoptysis.  She does report that she feels like she has had to sleep on more pillows over the last few days because of difficulty breathing at night.  She has not noted any particular leg swelling or swelling in her abdomen.  She is on a fluid pill.  She called her PCP and they advised her to come to the emergency department for further evaluation.  She states she has not had any fevers, abdominal pain.  She has been vaccinated for COVID x2.  No Covid exposure that she knows of. She denies any OCP use, recent immobilization, prior history of DVT/PE, recent surgery, leg swelling, or long travel.   The history is provided by the patient.       Past Medical History:  Diagnosis Date  . Anemia microcytic 03/31/2013  . Bipolar disorder ([place]) 03/31/2013  . CHF (congestive heart failure) ([place])   . Diabetes mellitus without complication ([place])   . Hyperlipidemia 03/31/2013  . Hypertension   . Obesity hypoventilation  syndrome ([place])   . Plantar fasciitis of left foot   . Pneumonia   . Pneumothorax   . Pulmonary infiltrates   . Rhabdomyolysis 03/31/2013   [place] secondary to abilify  . Vitamin D deficiency 03/31/2013    Patient Active Problem List   Diagnosis Date Noted  . Acute respiratory disease due to COVID-19 virus 02/07/2021  . Acute respiratory failure with hypoxia ([place]) 10/14/2019  . Pulmonary artery hypertension ([place]) 10/14/2019  . Hypothyroidism 10/14/2019  . Fibroids 03/29/2017  . Cryptogenic organizing pneumonia ([place])   . SOB (shortness of breath) on exertion   . COPD with acute exacerbation ([place]) 09/06/2016  . Bipolar disorder ([place]) 09/06/2016  . Abnormal CT scan, chest 04/04/2016  . Morbid obesity ([place]) 03/14/2016  . Hypoxemia   . Pneumothorax   . Urinary tract infection, site not specified   . Chronic diastolic CHF (congestive heart failure) ([place])   . Type 2 diabetes mellitus with complication ([place])   . Essential hypertension   . Affective psychosis, bipolar ([place])   . Uterine leiomyoma   . Acute respiratory failure ([place]) 02/17/2016  . Hypoxemic respiratory failure, chronic ([place]) 02/17/2016  . Diastolic CHF, chronic ([place]) 04/02/2013  . Obesity hypoventilation syndrome ([place]) 04/02/2013  . Abnormal EKG 03/31/2013  . Leukocytosis 03/31/2013  . HTN (hypertension) 03/31/2013  . Hyperlipidemia  03/31/2013  . Diabetes ([place]) 03/31/2013  . Vitamin D deficiency 03/31/2013  . Anemia 03/31/2013  . Bipolar I disorder, most recent episode (or current) manic ([place]) 03/17/2013    Past Surgical History:  Procedure Laterality Date  . CESAREAN SECTION     x2  . CYST EXCISION    . DILATATION & CURETTAGE/HYSTEROSCOPY WITH MYOSURE N/A 02/17/2016   Procedure: DILATATION & CURETTAGE/HYSTEROSCOPY/Myomectomy WITH MYOSURE;  Surgeon: Aloha Gell, MD;  Location: [facility];  Service: Gynecology;  Laterality: N/A;  . IR ANGIOGRAM PELVIS SELECTIVE OR SUPRASELECTIVE  03/29/2017  . IR ANGIOGRAM PELVIS  SELECTIVE OR SUPRASELECTIVE  03/29/2017  . IR ANGIOGRAM SELECTIVE EACH ADDITIONAL VESSEL  03/29/2017  . IR ANGIOGRAM SELECTIVE EACH ADDITIONAL VESSEL  03/29/2017  . IR EMBO TUMOR ORGAN ISCHEMIA INFARCT INC GUIDE ROADMAPPING  03/29/2017  . IR GENERIC HISTORICAL  11/14/2016   IR RADIOLOGIST EVAL & MGMT 11/14/2016 Greggory Keen, MD [facility] INTERV RAD  . IR RADIOLOGIST EVAL & MGMT  05/16/2017  . IR US GUIDE VASC ACCESS RIGHT  03/29/2017  . MYOMECTOMY N/A 02/17/2016   Procedure: Amado Coe WITH Jacklynn Barnacle;  Surgeon: Aloha Gell, MD;  Location: [facility];  Service: Gynecology;  Laterality: N/A;  . VENTRICULOPERITONEAL SHUNT       OB History   No obstetric history on file.     Family History  Problem Relation Age of Onset  . Diabetes Mother   . Hyperlipidemia Mother   . Hypertension Mother   . Lung cancer Father   . Hypertension Brother     Social History   Tobacco Use  . Smoking status: Never Smoker  . Smokeless tobacco: Never Used  Substance Use Topics  . Alcohol use: No  . Drug use: No    Home Medications Prior to Admission medications   Medication Sig Start Date End Date Taking? Authorizing Provider  amLODipine (NORVASC) 10 MG tablet Take 10 mg by mouth daily. 08/23/19  Yes [provider]  divalproex (DEPAKOTE ER) 500 MG 24 hr tablet Take 500 mg by mouth at bedtime.   Yes [provider]  irbesartan (AVAPRO) 300 MG tablet Take 300 mg by mouth daily.   Yes [provider]  lamoTRIgine (LAMICTAL) 200 MG tablet Take 100 mg by mouth every evening.   Yes [provider]  levothyroxine (SYNTHROID, LEVOTHROID) 50 MCG tablet Take 50 mcg by mouth daily before breakfast.   Yes [provider]  metFORMIN (GLUCOPHAGE) 1000 MG tablet Take 1,000 mg by mouth 2 (two) times daily with a meal.   Yes [provider]  TRULICITY 3 HE/5.2DP SOPN Inject 0.5 mLs into the skin once a week. Sundays 10/18/20  Yes [provider]  Vitamin D,  Ergocalciferol, (DRISDOL) 1.25 MG (50000 UNIT) CAPS capsule Take 50,000 Units by mouth once a week. Fridays 12/12/20  Yes [provider]  azithromycin (ZITHROMAX) 250 MG tablet Take by mouth. 02/02/21   [provider]  Continuous Blood Gluc Sensor (FREESTYLE LIBRE 14 DAY SENSOR) MISC SMARTSIG:1 Topical As Directed 01/07/21   [provider]  FeFum-FePoly-FA-B Cmp-C-Biot (INTEGRA PLUS) CAPS Take 1 capsule by mouth every evening.  Patient not taking: Reported on 02/07/2021    [provider]    Allergies    Abilify [aripiprazole]  Review of Systems   Review of Systems  Constitutional: Negative for fever.  Respiratory: Positive for cough and shortness of breath.   Cardiovascular: Positive for leg swelling. Negative for chest pain.  Gastrointestinal: Negative for abdominal pain, nausea  and vomiting.  Genitourinary: Negative for dysuria and hematuria.  Neurological: Negative for headaches.  All other systems reviewed and are negative.   Physical Exam Updated Vital Signs BP (!) 148/99   Pulse 73   Temp (!) 97.5 F (36.4 C) (Oral)   Resp 20   SpO2 100%   Physical Exam Vitals and nursing note reviewed.  Constitutional:      Appearance: Normal appearance. She is well-developed and well-nourished.  HENT:     Head: Normocephalic and atraumatic.     Mouth/Throat:     Mouth: Oropharynx is clear and moist and mucous membranes are normal.  Eyes:     General: Lids are normal.     Extraocular Movements: EOM normal.     Conjunctiva/sclera: Conjunctivae normal.     Pupils: Pupils are equal, round, and reactive to light.  Cardiovascular:     Rate and Rhythm: Normal rate and regular rhythm.     Pulses: Normal pulses.     Heart sounds: Normal heart sounds. No murmur heard. No friction rub. No gallop.   Pulmonary:     Breath sounds: Rales present.     Comments: Mild wheezing noted in upper lung fields.  Rales noted at mid lung fields that extends  distally. Abdominal:     Palpations: Abdomen is soft. Abdomen is not rigid.     Tenderness: There is no abdominal tenderness. There is no guarding.  Musculoskeletal:        General: Normal range of motion.     Cervical back: Full passive range of motion without pain.  Skin:    General: Skin is warm and dry.     Capillary Refill: Capillary refill takes less than 2 seconds.  Neurological:     Mental Status: She is alert and oriented to person, place, and time.  Psychiatric:        Mood and Affect: Mood and affect normal.        Speech: Speech normal.     ED Results / Procedures / Treatments   Labs (all labs ordered are listed, but only abnormal results are displayed) Labs Reviewed  BASIC METABOLIC PANEL - Abnormal; Notable for the following components:      Result Value   Chloride 96 (*)    Glucose, Bld 451 (*)    All other components within normal limits  CBC - Abnormal; Notable for the following components:   RBC 5.20 (*)    All other components within normal limits  POC SARS CORONAVIRUS 2 AG -  ED - Abnormal; Notable for the following components:   SARS Coronavirus 2 Ag POSITIVE (*)    All other components within normal limits  BRAIN NATRIURETIC PEPTIDE  HIV ANTIBODY (ROUTINE TESTING W REFLEX)  C-REACTIVE PROTEIN  D-DIMER, QUANTITATIVE  FERRITIN  FIBRINOGEN  LACTATE DEHYDROGENASE  PROCALCITONIN  HEMOGLOBIN A1C  HEPATIC FUNCTION PANEL  CBC WITH DIFFERENTIAL/PLATELET  COMPREHENSIVE METABOLIC PANEL  C-REACTIVE PROTEIN  D-DIMER, QUANTITATIVE  FERRITIN  MAGNESIUM  PHOSPHORUS  I-STAT BETA HCG BLOOD, ED ([facility], [facility], [facility] ONLY)  TROPONIN I (HIGH SENSITIVITY)  TROPONIN I (HIGH SENSITIVITY)    EKG None  Radiology DG Chest 2 View  Result Date: 02/07/2021 CLINICAL DATA:  Shortness of breath.  Decreased oxygen saturation. EXAM: CHEST - 2 VIEW COMPARISON:  PA and lateral chest 10/13/2019. FINDINGS: Airspace disease in the right middle lobe and lingula appears worse in the  right middle lobe. No pneumothorax or pleural effusion. Heart size is enlarged. No acute or  focal bony abnormality. 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 IMPRESSION: Airspace disease in the right middle lobe and lingula worrisome for pneumonia. Cardiomegaly. Electronically Signed   By: Inge Rise M.D.   On: 02/07/2021 12:29    Procedures Procedures   Medications Ordered in ED Medications  insulin aspart (novoLOG) injection 0-15 Units (has no administration in time range)  enoxaparin (LOVENOX) injection 40 mg (has no administration in time range)  sodium chloride flush (NS) 0.9 % injection 3 mL (has no administration in time range)  sodium chloride flush (NS) 0.9 % injection 3 mL (has no administration in time range)  sodium chloride flush (NS) 0.9 % injection 3 mL (has no administration in time range)  0.9 %  sodium chloride infusion (has no administration in time range)  albuterol (VENTOLIN HFA) 108 (90 Base) MCG/ACT inhaler 2 puff (has no administration in time range)  methylPREDNISolone sodium succinate (SOLU-MEDROL) injection 0.5 mg/kg (has no administration in time range)    Followed by  predniSONE (DELTASONE) tablet 50 mg (has no administration in time range)  guaiFENesin-dextromethorphan (ROBITUSSIN DM) 100-10 MG/5ML syrup 10 mL (has no administration in time range)  chlorpheniramine-HYDROcodone (TUSSIONEX) 10-8 MG/5ML suspension 5 mL (has no administration in time range)  ascorbic acid (VITAMIN C) tablet 500 mg (has no administration in time range)  zinc sulfate capsule 220 mg (has no administration in time range)  acetaminophen (TYLENOL) tablet 650 mg (has no administration in time range)  oxyCODONE (Oxy IR/ROXICODONE) immediate release tablet 5 mg (has no administration in time range)  docusate sodium (COLACE) capsule 100 mg (has no administration in time range)  polyethylene glycol (MIRALAX / GLYCOLAX) packet 17 g (has no administration in time range)  bisacodyl  (DULCOLAX) EC tablet 5 mg (has no administration in time range)  sodium phosphate (FLEET) 7-19 GM/118ML enema 1 enema (has no administration in time range)  ondansetron (ZOFRAN) tablet 4 mg (has no administration in time range)    Or  ondansetron (ZOFRAN) injection 4 mg (has no administration in time range)  insulin aspart (novoLOG) injection 0-5 Units (has no administration in time range)  insulin aspart (novoLOG) injection 4 Units (has no administration in time range)  remdesivir 200 mg in sodium chloride 0.9% 250 mL IVPB (has no administration in time range)    Followed by  remdesivir 100 mg in sodium chloride 0.9 % 100 mL IVPB (has no administration in time range)  albuterol (VENTOLIN HFA) 108 (90 Base) MCG/ACT inhaler 1-2 puff (2 puffs Inhalation Given 02/07/21 1509)    ED Course  I have reviewed the triage vital signs and the nursing notes.  Pertinent labs & imaging results that were available during my care of the patient were reviewed by me and considered in my medical decision making (see chart for details).    MDM Rules/Calculators/A&P                          50 year old female past with history of CHF, COPD presents for evaluation of worsening cough, shortness of breath.  Reports that she had been on 2 L of oxygen up until September 2021 when her primary care doctor/pulmonologist put her off of it.  She has not had any oxygen requirement since then.  On initial ED arrival, she is wearing 2 L of oxygen maintaining O2 sats in the 97.  On exam, she has mild expiratory wheezing and rales noted diffusely.  I-STAT beta negative.  BMP shows glucose of  51.  BUN/creatinine within normal limits.  CBC shows no leukocytosis.  Hemoglobin stable.  Initial troponin negative.  I-STAT beta negative.  Chest x-ray shows airspace disease in the right middle lobe and lingula worrisome for pneumonia.  Also evidence of cardiomegaly. BNP is within normal limits.   COVID is positive.   At this time,  she is stable on 2 L of oxygen.  Given her history of COPD, new oxygen requirement as well as concern for possible infection in chest x-ray, feel that admission is warranted.  Discussed patient with Dr. Lorin Mercy (hospitalist) who is agreeable to plan.   Ramon Brant was evaluated in Emergency Department on 02/07/2021 for the symptoms described in the history of present illness. She was evaluated in the context of the global COVID-19 pandemic, which necessitated consideration that the patient might be at risk for infection with the SARS-CoV-2 virus that causes COVID-19. Institutional protocols and algorithms that pertain to the evaluation of patients at risk for COVID-19 are in a state of rapid change based on information released by regulatory bodies including the CDC and federal and state organizations. These policies and algorithms were followed during the patient's care in the ED.  Portions of this note were generated with [employer]. Dictation errors may occur despite best attempts at proofreading.  Final Clinical Impression(s) / ED Diagnoses Final diagnoses:  COVID-19  Hypoxia    Rx / DC Orders ED Discharge Orders    None       Desma Mcgregor 02/07/21 1702    Daleen Bo, MD 02/09/21 [phone]

## 2021-02-08 DIAGNOSIS — F3110 Bipolar disorder, current episode manic without psychotic features, unspecified: Secondary | ICD-10-CM

## 2021-02-08 DIAGNOSIS — J449 Chronic obstructive pulmonary disease, unspecified: Secondary | ICD-10-CM

## 2021-02-08 LAB — COMPREHENSIVE METABOLIC PANEL
ALT: 14 U/L (ref 0–44)
AST: 16 U/L (ref 15–41)
Albumin: 3.1 g/dL — ABNORMAL LOW (ref 3.5–5.0)
Alkaline Phosphatase: 89 U/L (ref 38–126)
Anion gap: 9 (ref 5–15)
BUN: 5 mg/dL — ABNORMAL LOW (ref 6–20)
CO2: 29 mmol/L (ref 22–32)
Calcium: 9.4 mg/dL (ref 8.9–10.3)
Chloride: 98 mmol/L (ref 98–111)
Creatinine, Ser: 0.46 mg/dL (ref 0.44–1.00)
GFR, Estimated: >60 mL/min (ref >60)
Glucose, Bld: 271 mg/dL — ABNORMAL HIGH (ref 70–99)
Potassium: 4.3 mmol/L (ref 3.5–5.1)
Sodium: 136 mmol/L (ref 135–145)
Total Bilirubin: 0.5 mg/dL (ref 0.3–1.2)
Total Protein: 7.2 g/dL (ref 6.5–8.1)

## 2021-02-08 LAB — CBC WITH DIFFERENTIAL/PLATELET
Abs Immature Granulocytes: 0 10*3/uL (ref 0.00–0.07)
Basophils Absolute: 0 10*3/uL (ref 0.0–0.1)
Basophils Relative: 0 %
Eosinophils Absolute: 0 10*3/uL (ref 0.0–0.5)
Eosinophils Relative: 0 %
HCT: 44 % (ref 36.0–46.0)
Hemoglobin: 13.5 g/dL (ref 12.0–15.0)
Lymphocytes Relative: 14 %
Lymphs Abs: 0.8 10*3/uL (ref 0.7–4.0)
MCH: 26.3 pg (ref 26.0–34.0)
MCHC: 30.7 g/dL (ref 30.0–36.0)
MCV: 85.6 fL (ref 80.0–100.0)
Monocytes Absolute: 0 10*3/uL — ABNORMAL LOW (ref 0.1–1.0)
Monocytes Relative: 0 %
Neutro Abs: 4.7 10*3/uL (ref 1.7–7.7)
Neutrophils Relative %: 86 %
Platelets: 308 10*3/uL (ref 150–400)
RBC: 5.14 MIL/uL — ABNORMAL HIGH (ref 3.87–5.11)
RDW: 13.6 % (ref 11.5–15.5)
WBC: 5.5 10*3/uL (ref 4.0–10.5)
nRBC: 0 % (ref 0.0–0.2)
nRBC: 0 /100 WBC

## 2021-02-08 LAB — GLUCOSE, CAPILLARY
Glucose-Capillary: 310 mg/dL — ABNORMAL HIGH (ref 70–99)
Glucose-Capillary: 353 mg/dL — ABNORMAL HIGH (ref 70–99)
Glucose-Capillary: 366 mg/dL — ABNORMAL HIGH (ref 70–99)
Glucose-Capillary: 395 mg/dL — ABNORMAL HIGH (ref 70–99)

## 2021-02-08 LAB — TSH: TSH: 0.601 u[IU]/mL (ref 0.350–4.500)

## 2021-02-08 LAB — FERRITIN: Ferritin: 186 ng/mL (ref 11–307)

## 2021-02-08 LAB — MAGNESIUM: Magnesium: 1.9 mg/dL (ref 1.7–2.4)

## 2021-02-08 LAB — C-REACTIVE PROTEIN: CRP: 3.3 mg/dL — ABNORMAL HIGH (ref <1.0)

## 2021-02-08 LAB — PHOSPHORUS: Phosphorus: 3.4 mg/dL (ref 2.5–4.6)

## 2021-02-08 LAB — D-DIMER, QUANTITATIVE: D-Dimer, Quant: 0.74 ug/mL-FEU — ABNORMAL HIGH (ref 0.00–0.50)

## 2021-02-08 MED ORDER — INSULIN ASPART 100 UNIT/ML ~~LOC~~ SOLN
8.0000 [IU] | Freq: Three times a day (TID) | SUBCUTANEOUS | Status: DC
  Administered 2021-02-08 – 2021-02-10 (×6): 8 [IU] via SUBCUTANEOUS

## 2021-02-08 MED ORDER — INSULIN GLARGINE 100 UNIT/ML ~~LOC~~ SOLN
16.0000 [IU] | Freq: Every day | SUBCUTANEOUS | Status: DC
  Administered 2021-02-08 – 2021-02-09 (×2): 16 [IU] via SUBCUTANEOUS
  Filled 2021-02-08 (×3): qty 0.16

## 2021-02-08 NOTE — Evaluation (Signed)
Physical Therapy Evaluation Patient Details Name: [name] MRN: [id number] DOB: March 26, 1971 Today's Date: 02/08/2021   History of Present Illness  Pt is a 50 y.o. female admitted 02/07/21 with SOB, cough and hypoxia. Workup for acute hypoxic respiratory failure due to COVID-19 PNA. PMH includes COPD, HTN, DM, CHF, obesity, bipolar.    Clinical Impression  Pt presents with an overall decrease in functional mobility secondary to above. PTA, pt independent, works from home, lives with family. Initiated education re: current condition, O2 needs, activity recommendations, therex, energy conservation, and importance of mobility. Today, pt able mobilize and perform ADL tasks without assist, only requiring supervision to monitor SpO2. Difficulty getting reliable pulse ox reading this session (down to 83% on RA with activity, but returning to 89% on RA within 10-sec of seated rest). Pt aware of poor inspiratory effort/ability; pt also limited by non-productive cough with each deep inhale. Pt would benefit from continued acute PT services to maximize functional mobility and independence prior to d/c home.    Follow Up Recommendations No PT follow up    Equipment Recommendations  None recommended by PT    Recommendations for Other Services       Precautions / Restrictions Precautions Precautions: Other (comment) Precaution Comments: Watch SpO2 (difficult getting reliable reading from either finger probe 2/22) Restrictions Weight Bearing Restrictions: No      Mobility  Bed Mobility               General bed mobility comments: Received sitting in recliner    Transfers Overall transfer level: Independent Equipment used: None             General transfer comment: Indep standing from recliner, chair at sink and low toilet height  Ambulation/Gait Ambulation/Gait assistance: Supervision Gait Distance (Feet): 350 Feet Assistive device: None Gait Pattern/deviations:  Step-through pattern;Decreased stride length Gait velocity: Decreased   General Gait Details: Slow, steady ambulation independent without DME; supervision to monitor SpO2  Stairs            Wheelchair Mobility    Modified Rankin (Stroke Patients Only)       Balance Overall balance assessment: No apparent balance deficits (not formally assessed)   Sitting balance-Leahy Scale: Normal Sitting balance - Comments: Able to don depends and leggings while seated on toilet     Standing balance-Leahy Scale: Good                               Pertinent Vitals/Pain Pain Assessment: No/denies pain    Home Living Family/patient expects to be discharged to:: Private residence Living Arrangements: Spouse/significant other;Children Available Help at Discharge: Family Type of Home: House Home Access: Level entry     Home Layout: Two level;Able to live on main level with bedroom/bathroom Home Equipment: Shower seat      Prior Function Level of Independence: Independent         Comments: Independent without DME; works from home, primarily at computer ("self-employed"). Has been wearing 2L O2 [place] leading to admission, but prior to taat not on home O2     Hand Dominance        Extremity/Trunk Assessment   Upper Extremity Assessment Upper Extremity Assessment: Overall WFL for tasks assessed    Lower Extremity Assessment Lower Extremity Assessment: Overall WFL for tasks assessed       Communication   Communication: No difficulties  Cognition Arousal/Alertness: Awake/alert Behavior During Therapy: WFL for tasks  assessed/performed Overall Cognitive Status: Within Functional Limits for tasks assessed                                        General Comments General comments (skin integrity, edema, etc.): SpO2 96% on 1.5 L at rest, down to 89% on RA after using bathroom and getting dressed; unreliable reading in hallway with ambulation on RA,  reading 83% upon return to room with reliable pleth, but immediately back up to >/89% once seated    Exercises Other Exercises Other Exercises: provided patient incentive spirometer - pt able to pull ~250-578mL with good technique - pt aware of poor inspiratory effort/ability; pt also limited by non-productive cough with each deep inhale Other Exercises: Pursed lip breathing to recover after walk   Assessment/Plan    PT Assessment Patient needs continued PT services  PT Problem List Decreased activity tolerance;Decreased mobility;Cardiopulmonary status limiting activity       PT Treatment Interventions Gait training;Stair training;Functional mobility training;Therapeutic activities;Therapeutic exercise;Patient/family education    PT Goals (Current goals can be found in the Care Plan section)  Acute Rehab PT Goals Patient Stated Goal: Be able to breathe better without coughing PT Goal Formulation: With patient Time For Goal Achievement: 02/22/21 Potential to Achieve Goals: Good    Frequency Min 3X/week   Barriers to discharge        Co-evaluation               AM-PAC PT "6 Clicks" Mobility  Outcome Measure Help needed turning from your back to your side while in a flat bed without using bedrails?: None Help needed moving from lying on your back to sitting on the side of a flat bed without using bedrails?: None Help needed moving to and from a bed to a chair (including a wheelchair)?: None Help needed standing up from a chair using your arms (e.g., wheelchair or bedside chair)?: None Help needed to walk in hospital room?: A Little Help needed climbing 3-5 steps with a railing? : A Little 6 Click Score: 22    End of Session   Activity Tolerance: Patient tolerated treatment well;Treatment limited secondary to medical complications (Comment) (limited by [place]) Patient left: in chair;with call bell/phone within reach Nurse Communication: Mobility status PT Visit  Diagnosis: Other abnormalities of gait and mobility (R26.89)    Time: 0076-2263 PT Time Calculation (min) (ACUTE ONLY): 28 min   Charges:   PT Evaluation $PT Eval Moderate Complexity: 1 Mod PT Treatments $Therapeutic Exercise: 8-22 mins   Mabeline Caras, PT, DPT Acute Rehabilitation Services  Pager [phone] Office [place] 02/08/2021, 2:10 PM

## 2021-02-08 NOTE — Progress Notes (Signed)
PROGRESS NOTE                                                                                                                                                                                                             Patient Demographics:    [name], is a 50 y.o. female, DOB - 1971/04/25, TUU:[id number]  Outpatient Primary MD for the patient is Glendon Axe, MD   Admit date - 02/07/2021   LOS - 1  Chief Complaint  Patient presents with  . Shortness of Breath       Brief Narrative: Patient is a 50 y.o. female with PMHx of HTN, HLD, DM, chronic diastolic heart failure, COPD-presented with worsening shortness of breath-she was found to have acute hypoxic respiratory failure due to COVID-19 pneumonia.  COVID-19 vaccinated status: Vaccinated but not boosted.  Significant Events: 2/21>> Admit to [facility] for hypoxia due to COVID-19  Significant studies: 2/21>>Chest x-ray: Right middle lobe airspace disease  COVID-19 medications: Steroids: 2/21>> Remdesivir: 2/21>>  Antibiotics: None  Microbiology data: None  Procedures: None  Consults: None  DVT prophylaxis: enoxaparin (LOVENOX) injection 40 mg Start: 02/07/21 1700   Subjective:    [name] today feels better-she was titrated down to around 2 L of oxygen.   Assessment  & Plan :   Acute Hypoxic Resp Failure due to Covid 19 Viral pneumonia: Has mild hypoxemia-continue steroid/Remdesivir.  She has no signs of volume overload-does not require Lasix.  Continue to monitor closely-and continued attempts to slowly titrate down FiO2.  Will need to assess for home O2 requirement prior to discharge.  Fever: afebrile O2 requirements:  SpO2: 97 % O2 Flow Rate (L/min): 3 L/min   COVID-19 Labs: Recent Labs    02/07/21 1720 02/08/21 0059  DDIMER 0.41 0.74*  FERRITIN 172 186  LDH 207*  --   CRP 4.3* 3.3*       Component Value Date/Time   BNP  20.1 02/07/2021 1148    Recent Labs  Lab 02/07/21 1720  PROCALCITON <0.10    Lab Results  Component Value Date   [place] NEGATIVE 10/14/2019     Prone/Incentive Spirometry: encouraged  incentive spirometry use 3-4/hour  COPD: Stable-apparently was on home O2 till September 2021  Bipolar disorder: Stable-continue Lamictal/Depakote  Hypothyroidism: Continue Synthroid-TSH stable.  Chronic diastolic heart failure: Compensated-follow closely.  HTN:  BP stable-continue Norvasc/Avapro  DM-2 (A1c 12.4 on 2/21) with uncontrolled hyperglycemia due to steroids: CBGs uncontrolled-increase Lantus to 16 units daily, increase Premeal NovoLog to 8 units-continue SSI-follow and adjust  Recent Labs    02/07/21 2015 02/08/21 0749 02/08/21 1159  GLUCAP 332* 310* 353*   Obesity: Estimated body mass index is 35.92 kg/m as calculated from the following:   Height as of this encounter: 4\' 9"  (1.448 m).   Weight as of this encounter: 75.3 kg.    ABG:    Component Value Date/Time   PHART 7.405 09/06/2016 1735   PCO2ART 52.5 (H) 09/06/2016 1735   PO2ART 82.0 (L) 09/06/2016 1735   HCO3 32.9 (H) 09/06/2016 1735   TCO2 32 09/20/2016 1918   O2SAT 96.0 09/06/2016 1735    Vent Settings: N/A    Condition - Guarded  Family Communication  :  Spouse-Kenneth-[phone] called on 2/22-however unable to contact-unable to leave voicemail  Code Status :  Full Code  Diet :  Diet Order            Diet Carb Modified Fluid consistency: Thin; Room service appropriate? Yes  Diet effective now                  Disposition Plan  :   Status is: Inpatient  Remains inpatient appropriate because:Inpatient level of care appropriate due to severity of illness   Dispo: The patient is from: Home              Anticipated d/c is to: Home              Anticipated d/c date is: 2 days              Patient currently is not medically stable to d/c.   Difficult to place patient No   Barriers  to discharge: Hypoxia requiring O2 supplementation/complete 5 days of IV Remdesivir  Antimicorbials  :    Anti-infectives (From admission, onward)   Start     Dose/Rate Route Frequency Ordered Stop   02/08/21 1000  remdesivir 100 mg in sodium chloride 0.9 % 100 mL IVPB       "Followed by" Linked Group Details   100 mg 200 mL/hr over 30 Minutes Intravenous Daily 02/07/21 1655 02/12/21 0959   02/07/21 1700  remdesivir 200 mg in sodium chloride 0.9% 250 mL IVPB       "Followed by" Linked Group Details   200 mg 580 mL/hr over 30 Minutes Intravenous Once 02/07/21 1655 02/07/21 1841      Inpatient Medications  Scheduled Meds: . amLODipine  10 mg Oral Daily  . vitamin C  500 mg Oral Daily  . divalproex  500 mg Oral QHS  . docusate sodium  100 mg Oral BID  . enoxaparin (LOVENOX) injection  40 mg Subcutaneous Q24H  . insulin aspart  0-15 Units Subcutaneous TID WC  . insulin aspart  0-5 Units Subcutaneous QHS  . insulin aspart  4 Units Subcutaneous TID WC  . insulin glargine  10 Units Subcutaneous QHS  . irbesartan  300 mg Oral Daily  . lamoTRIgine  100 mg Oral QPM  . levothyroxine  50 mcg Oral QAC breakfast  . methylPREDNISolone (SOLU-MEDROL) injection  0.5 mg/kg Intravenous Q12H   Followed by  . [START ON 02/10/2021] predniSONE  50 mg Oral Daily  . sodium chloride flush  3 mL Intravenous Q12H  . sodium chloride flush  3 mL Intravenous Q12H  . zinc sulfate  220 mg  Oral Daily   Continuous Infusions: . sodium chloride    . remdesivir 100 mg in NS 100 mL 100 mg (02/08/21 0925)   PRN Meds:.sodium chloride, acetaminophen, albuterol, bisacodyl, chlorpheniramine-HYDROcodone, guaiFENesin-dextromethorphan, influenza vac split quadrivalent PF, ondansetron **OR** ondansetron (ZOFRAN) IV, oxyCODONE, polyethylene glycol, sodium chloride flush, sodium phosphate   Time Spent in minutes  25  See all Orders from today for further details   Oren Binet M.D on 02/08/2021 at 3:04 PM  To page  go to [email or web address] - use universal password  [facility] -  Office  [phone]    Objective:   Vitals:   02/07/21 2015 02/07/21 2351 02/08/21 0430 02/08/21 0727  BP: (!) 145/92 132/87 128/84 135/86  Pulse: 88 90 68 63  Resp: 20 16 19 20   Temp: 98.2 F (36.8 C) 98.2 F (36.8 C) 98 F (36.7 C) 97.9 F (36.6 C)  TempSrc: Oral Oral Oral Oral  SpO2: 98% 91% 95% 97%  Weight:      Height:        Wt Readings from Last 3 Encounters:  02/07/21 75.3 kg  10/16/19 82.7 kg  05/16/17 77.1 kg     Intake/Output Summary (Last 24 hours) at 02/08/2021 1504 Last data filed at 02/08/2021 0926 Gross per 24 hour  Intake 710 ml  Output -  Net 710 ml     Physical Exam Gen Exam:Alert awake-not in any distress HEENT:atraumatic, normocephalic Chest: B/L clear to auscultation anteriorly CVS:S1S2 regular Abdomen:soft non tender, non distended Extremities:no edema Neurology: Non focal Skin: no rash   Data Review:    CBC Recent Labs  Lab 02/07/21 1148 02/08/21 0059  WBC 4.8 5.5  HGB 13.9 13.5  HCT 44.5 44.0  PLT 320 308  MCV 85.6 85.6  MCH 26.7 26.3  MCHC 31.2 30.7  RDW 13.8 13.6  LYMPHSABS  --  0.8  MONOABS  --  0.0*  EOSABS  --  0.0  BASOSABS  --  0.0    Chemistries  Recent Labs  Lab 02/07/21 1148 02/07/21 1720 02/08/21 0059  NA 135  --  136  K 4.0  --  4.3  CL 96*  --  98  CO2 28  --  29  GLUCOSE 451*  --  271*  BUN 6  --  5*  CREATININE 0.61  --  0.46  CALCIUM 9.2  --  9.4  MG  --   --  1.9  AST  --  16 16  ALT  --  13 14  ALKPHOS  --  109 89  BILITOT  --  0.9 0.5   ------------------------------------------------------------------------------------------------------------------ No results for input(s): CHOL, HDL, LDLCALC, TRIG, CHOLHDL, LDLDIRECT in the last 72 hours.  Lab Results  Component Value Date   HGBA1C 12.4 (H) 02/07/2021    ------------------------------------------------------------------------------------------------------------------ Recent Labs    02/08/21 0059  TSH 0.601   ------------------------------------------------------------------------------------------------------------------ Recent Labs    02/07/21 1720 02/08/21 0059  FERRITIN 172 186    Coagulation profile No results for input(s): INR, PROTIME in the last 168 hours.  Recent Labs    02/07/21 1720 02/08/21 0059  DDIMER 0.41 0.74*    Cardiac Enzymes No results for input(s): CKMB, TROPONINI, MYOGLOBIN in the last 168 hours.  Invalid input(s): CK ------------------------------------------------------------------------------------------------------------------    Component Value Date/Time   BNP 20.1 02/07/2021 1148    Micro Results No results found for this or any previous visit (from the past 240 hour(s)).  Radiology Reports DG Chest 2 View  Result Date: 02/07/2021  CLINICAL DATA:  Shortness of breath.  Decreased oxygen saturation. EXAM: CHEST - 2 VIEW COMPARISON:  PA and lateral chest 10/13/2019. FINDINGS: Airspace disease in the right middle lobe and lingula appears worse in the right middle lobe. No pneumothorax or pleural effusion. Heart size is enlarged. No acute or focal bony abnormality. 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 IMPRESSION: Airspace disease in the right middle lobe and lingula worrisome for pneumonia. Cardiomegaly. Electronically Signed   By: Inge Rise M.D.   On: 02/07/2021 12:29

## 2021-02-08 NOTE — Progress Notes (Signed)
Inpatient Diabetes Program Recommendations  AACE/ADA: New Consensus Statement on Inpatient Glycemic Control (2015)  Target Ranges:  Prepandial:   less than 140 mg/dL      Peak postprandial:   less than 180 mg/dL (1-2 hours)      Critically ill patients:  140 - 180 mg/dL   Lab Results  Component Value Date   GLUCAP 353 (H) 02/08/2021   HGBA1C 12.4 (H) 02/07/2021    Review of Glycemic Control Results for DEVYNE, HAUGER (MRN [id number]) as of 02/08/2021 15:31  Ref. Range 02/07/2021 17:59 02/07/2021 20:15 02/08/2021 07:49 02/08/2021 11:59  Glucose-Capillary Latest Ref Range: 70 - 99 mg/dL 329 (H) 332 (H) 310 (H) 353 (H)   Diabetes history: DM 2 Outpatient Diabetes medications:  Metformin 8343 mg bid, Trulicity 0.5 mg weekly, CGM-Freestyle libre Current orders for Inpatient glycemic control:  Novolog moderate tid with meals and HS Lantus 16 units daily Novolog 8 units tid with meals  Inpatient Diabetes Program Recommendations:    Agree with current orders.  Based on patient's A1C, patient likely needs SQ basal insulin?  Will f/u with patient prior to d/c regarding potential need for insulin/and A1C.   Thanks  Adah Perl, RN, BC-ADM Inpatient Diabetes Coordinator Pager [phone] (8a-5p)

## 2021-02-09 LAB — GLUCOSE, CAPILLARY
Glucose-Capillary: 183 mg/dL — ABNORMAL HIGH (ref 70–99)
Glucose-Capillary: 324 mg/dL — ABNORMAL HIGH (ref 70–99)
Glucose-Capillary: 399 mg/dL — ABNORMAL HIGH (ref 70–99)
Glucose-Capillary: 304 mg/dL — ABNORMAL HIGH (ref 70–99)

## 2021-02-09 LAB — CBC WITH DIFFERENTIAL/PLATELET
Abs Immature Granulocytes: 0.06 10*3/uL (ref 0.00–0.07)
Basophils Absolute: 0 10*3/uL (ref 0.0–0.1)
Basophils Relative: 0 %
Eosinophils Absolute: 0 10*3/uL (ref 0.0–0.5)
Eosinophils Relative: 0 %
HCT: 42.4 % (ref 36.0–46.0)
Hemoglobin: 13.3 g/dL (ref 12.0–15.0)
Immature Granulocytes: 1 %
Lymphocytes Relative: 15 %
Lymphs Abs: 1.3 10*3/uL (ref 0.7–4.0)
MCH: 26.7 pg (ref 26.0–34.0)
MCHC: 31.4 g/dL (ref 30.0–36.0)
MCV: 85 fL (ref 80.0–100.0)
Monocytes Absolute: 0.6 10*3/uL (ref 0.1–1.0)
Monocytes Relative: 7 %
Neutro Abs: 6.8 10*3/uL (ref 1.7–7.7)
Neutrophils Relative %: 77 %
Platelets: 395 10*3/uL (ref 150–400)
RBC: 4.99 MIL/uL (ref 3.87–5.11)
RDW: 13.4 % (ref 11.5–15.5)
WBC: 8.8 10*3/uL (ref 4.0–10.5)
nRBC: 0 % (ref 0.0–0.2)

## 2021-02-09 LAB — COMPREHENSIVE METABOLIC PANEL
ALT: 13 U/L (ref 0–44)
AST: 12 U/L — ABNORMAL LOW (ref 15–41)
Albumin: 2.9 g/dL — ABNORMAL LOW (ref 3.5–5.0)
Alkaline Phosphatase: 87 U/L (ref 38–126)
Anion gap: 11 (ref 5–15)
BUN: 13 mg/dL (ref 6–20)
CO2: 26 mmol/L (ref 22–32)
Calcium: 9.5 mg/dL (ref 8.9–10.3)
Chloride: 98 mmol/L (ref 98–111)
Creatinine, Ser: 0.56 mg/dL (ref 0.44–1.00)
GFR, Estimated: >60 mL/min (ref >60)
Glucose, Bld: 226 mg/dL — ABNORMAL HIGH (ref 70–99)
Potassium: 4.3 mmol/L (ref 3.5–5.1)
Sodium: 135 mmol/L (ref 135–145)
Total Bilirubin: 0.5 mg/dL (ref 0.3–1.2)
Total Protein: 6.5 g/dL (ref 6.5–8.1)

## 2021-02-09 LAB — MAGNESIUM: Magnesium: 2 mg/dL (ref 1.7–2.4)

## 2021-02-09 LAB — D-DIMER, QUANTITATIVE: D-Dimer, Quant: 0.39 ug/mL-FEU (ref 0.00–0.50)

## 2021-02-09 LAB — C-REACTIVE PROTEIN: CRP: 1 mg/dL — ABNORMAL HIGH (ref <1.0)

## 2021-02-09 LAB — PHOSPHORUS: Phosphorus: 4.7 mg/dL — ABNORMAL HIGH (ref 2.5–4.6)

## 2021-02-09 LAB — FERRITIN: Ferritin: 172 ng/mL (ref 11–307)

## 2021-02-09 MED ORDER — PREDNISONE 20 MG PO TABS
40.0000 mg | ORAL_TABLET | Freq: Every day | ORAL | Status: DC
  Administered 2021-02-10: 40 mg via ORAL
  Filled 2021-02-09: qty 2

## 2021-02-09 NOTE — Progress Notes (Signed)
SATURATION QUALIFICATIONS: (This note is used to comply with regulatory documentation for home oxygen)  Patient Saturations on Room Air at Rest = 96%  Patient Saturations on Room Air while Ambulating = 83%  Patient Saturations on 2 Liters of oxygen while Ambulating = 94%  Please briefly explain why patient needs home oxygen: On room air while ambulating patient sat O2 dropped to 83% and also, when she is sleeping patient needs 2L O2 to keep sat O2 above 90%.

## 2021-02-09 NOTE — Evaluation (Signed)
Occupational Therapy Evaluation Patient Details Name: [name] MRN: [id number] DOB: 11-13-1971 Today's Date: 02/09/2021    History of Present Illness Pt is a 50 y.o. female admitted 02/07/21 with SOB, cough and hypoxia. Workup for acute hypoxic respiratory failure due to COVID-19 PNA. PMH includes COPD, HTN, DM, CHF, obesity, bipolar.   Clinical Impression   Patient evaluated by Occupational Therapy with no further acute OT needs identified. All education has been completed and the patient has no further questions. Pt is able to perform ADLs at supervision level, and I anticipate she will progress quickly to independence.  She was able to maintain Sp02 84-100% on RA during activity, and able to keep sats >87% with use of pursed lip breathing strategies.  See below for any follow-up Occupational Therapy or equipment needs. OT is signing off. Thank you for this referral.      Follow Up Recommendations  No OT follow up;Supervision - Intermittent    Equipment Recommendations  None recommended by OT    Recommendations for Other Services       Precautions / Restrictions Precautions Precautions: Other (comment) Precaution Comments: monitor Sp02      Mobility Bed Mobility               General bed mobility comments: pt sitting up in recliner    Transfers Overall transfer level: Independent                    Balance Overall balance assessment: No apparent balance deficits (not formally assessed)                                         ADL either performed or assessed with clinical judgement   ADL Overall ADL's : Needs assistance/impaired Eating/Feeding: Independent   Grooming: Wash/dry hands;Wash/dry face;Oral care;Supervision/safety;Standing   Upper Body Bathing: Set up;Sitting   Lower Body Bathing: Supervison/ safety;Sit to/from stand   Upper Body Dressing : Set up;Sitting   Lower Body Dressing: Supervision/safety;Sit to/from  stand   Toilet Transfer: Supervision/safety;Ambulation;Comfort height toilet   Toileting- Clothing Manipulation and Hygiene: Supervision/safety;Sit to/from stand       Functional mobility during ADLs: Supervision/safety General ADL Comments: discussed recommendation that pt sit to shower. Reviewed energy conservation strategies, and reviewed pursed lip breathing during activity     Vision Baseline Vision/History: Wears glasses Wears Glasses: At all times Patient Visual Report: No change from baseline       Perception     Praxis      Pertinent Vitals/Pain Pain Assessment: No/denies pain     Hand Dominance Right   Extremity/Trunk Assessment Upper Extremity Assessment Upper Extremity Assessment: Overall WFL for tasks assessed   Lower Extremity Assessment Lower Extremity Assessment: Overall WFL for tasks assessed   Cervical / Trunk Assessment Cervical / Trunk Assessment: Normal   Communication Communication Communication: No difficulties   Cognition Arousal/Alertness: Awake/alert Behavior During Therapy: WFL for tasks assessed/performed Overall Cognitive Status: Within Functional Limits for tasks assessed                                     General Comments  Sp02 84-100% with activity.  She is able to increase Sp02 to >88% with pursed lip breathing during activity on RA.  reinforced breathing technique and discussed energy conservation strategies  Exercises     Shoulder Instructions      Home Living Family/patient expects to be discharged to:: Private residence Living Arrangements: Children;Spouse/significant other Available Help at Discharge: Family Type of Home: House Home Access: Level entry     Home Layout: Two level;Able to live on main level with bedroom/bathroom     Bathroom Shower/Tub: Teacher, early years/pre: Standard     Home Equipment: Building services engineer Comments: lives with spouse and 2 adult children       Prior Functioning/Environment Level of Independence: Independent                 OT Problem List: Decreased activity tolerance      OT Treatment/Interventions:      OT Goals(Current goals can be found in the care plan section) Acute Rehab OT Goals Patient Stated Goal: to get my breathing better OT Goal Formulation: All assessment and education complete, DC therapy  OT Frequency:     Barriers to D/C:            Co-evaluation              AM-PAC OT "6 Clicks" Daily Activity     Outcome Measure Help from another person eating meals?: None Help from another person taking care of personal grooming?: A Little Help from another person toileting, which includes using toliet, bedpan, or urinal?: A Little Help from another person bathing (including washing, rinsing, drying)?: A Little Help from another person to put on and taking off regular upper body clothing?: A Little Help from another person to put on and taking off regular lower body clothing?: A Little 6 Click Score: 19   End of Session Nurse Communication: Mobility status  Activity Tolerance: Patient tolerated treatment well Patient left: in chair;with call bell/phone within reach  OT Visit Diagnosis: Muscle weakness (generalized) (M62.81)                Time: 7078-6754 OT Time Calculation (min): 40 min Charges:  OT General Charges $OT Visit: 1 Visit OT Evaluation $OT Eval Moderate Complexity: 1 Mod OT Treatments $Self Care/Home Management : 8-22 mins $Therapeutic Activity: 8-22 mins  Nilsa Nutting., OTR/L Acute Rehabilitation Services Pager [phone] Office [phone]   Lucille Passy M 02/09/2021, 2:55 PM

## 2021-02-09 NOTE — Progress Notes (Addendum)
PROGRESS NOTE                                                                                                                                                                                                             Patient Demographics:    [name], is a 50 y.o. female, DOB - [birth year], YOV:[id number]  Outpatient Primary MD for the patient is Glendon Axe, MD   Admit date - 02/07/2021   LOS - 2  Chief Complaint  Patient presents with  . Shortness of Breath       Brief Narrative: Patient is a 50 y.o. female with PMHx of HTN, HLD, DM, chronic diastolic heart failure, COPD-presented with worsening shortness of breath-she was found to have acute hypoxic respiratory failure due to COVID-19 pneumonia.  COVID-19 vaccinated status: Vaccinated but not boosted.  Significant Events: 2/21>> Admit to [facility] for hypoxia due to COVID-19  Significant studies: 2/21>>Chest x-ray: Right middle lobe airspace disease  COVID-19 medications: Steroids: 2/21>> Remdesivir: 2/21>>  Antibiotics: None  Microbiology data: None  Procedures: None  Consults: None  DVT prophylaxis: enoxaparin (LOVENOX) injection 40 mg Start: 02/07/21 1700   Subjective:   Feeling better-titrated to room air earlier this morning.   Assessment  & Plan :   Acute Hypoxic Resp Failure due to Covid 19 Viral pneumonia: Hypoxemia improved-on room air this morning.  Continue steroid/Remdesivir.  If clinical improvement continues-we will plan for discharge on 2/24.    Will need to assess for home O2 requirement prior to discharge.  Fever: afebrile O2 requirements:  SpO2: 90 % O2 Flow Rate (L/min): 2 L/min   COVID-19 Labs: Recent Labs    02/07/21 1720 02/08/21 0059 02/09/21 0204  DDIMER 0.41 0.74* 0.39  FERRITIN 172 186 172  LDH 207*  --   --   CRP 4.3* 3.3* 1.0*       Component Value Date/Time   BNP 20.1 02/07/2021 1148    Recent  Labs  Lab 02/07/21 1720  PROCALCITON <0.10    Lab Results  Component Value Date   [place] NEGATIVE 10/14/2019     Prone/Incentive Spirometry: encouraged  incentive spirometry use 3-4/hour  COPD: Stable-apparently was on home O2 till September 2021  Bipolar disorder: Stable-continue Lamictal/Depakote  Hypothyroidism: Continue Synthroid-TSH stable.  Chronic diastolic heart failure: Compensated-follow closely.  HTN: BP stable-continue Norvasc/Avapro  DM-2 (A1c 12.4 on 2/21) with uncontrolled hyperglycemia due to steroids: CBGs remain uncontrolled but fluctuating-just increased insulin regimen yesterday-steroid dosage tapered down significantly today-reassess tomorrow.  Continue Lantus 16 units daily, 8 units of NovoLog with meals and SSI.    Recent Labs    02/08/21 2025 02/09/21 0718 02/09/21 1229  GLUCAP 395* 183* 324*   Obesity: Estimated body mass index is 35.92 kg/m as calculated from the following:   Height as of this encounter: 4\' 9"  (1.448 m).   Weight as of this encounter: 75.3 kg.    ABG:    Component Value Date/Time   PHART 7.405 09/06/2016 1735   PCO2ART 52.5 (H) 09/06/2016 1735   PO2ART 82.0 (L) 09/06/2016 1735   HCO3 32.9 (H) 09/06/2016 1735   TCO2 32 09/20/2016 1918   O2SAT 96.0 09/06/2016 1735    Vent Settings: N/A    Condition - Guarded  Family Communication  :  Spouse-Kenneth-[phone] called on 2/23-however unable to contact-unable to leave voicemail  Code Status :  Full Code  Diet :  Diet Order            Diet Carb Modified Fluid consistency: Thin; Room service appropriate? Yes  Diet effective now                  Disposition Plan  :   Status is: Inpatient  Remains inpatient appropriate because:Inpatient level of care appropriate due to severity of illness   Dispo: The patient is from: Home              Anticipated d/c is to: Home              Anticipated d/c date is: 1days              Patient currently is not  medically stable to d/c.   Difficult to place patient No   Barriers to discharge: Hypoxia requiring O2 supplementation/complete 5 days of IV Remdesivir  Antimicorbials  :    Anti-infectives (From admission, onward)   Start     Dose/Rate Route Frequency Ordered Stop   02/08/21 1000  remdesivir 100 mg in sodium chloride 0.9 % 100 mL IVPB       "Followed by" Linked Group Details   100 mg 200 mL/hr over 30 Minutes Intravenous Daily 02/07/21 1655 02/12/21 0959   02/07/21 1700  remdesivir 200 mg in sodium chloride 0.9% 250 mL IVPB       "Followed by" Linked Group Details   200 mg 580 mL/hr over 30 Minutes Intravenous Once 02/07/21 1655 02/07/21 1841      Inpatient Medications  Scheduled Meds: . amLODipine  10 mg Oral Daily  . vitamin C  500 mg Oral Daily  . divalproex  500 mg Oral QHS  . docusate sodium  100 mg Oral BID  . enoxaparin (LOVENOX) injection  40 mg Subcutaneous Q24H  . insulin aspart  0-15 Units Subcutaneous TID WC  . insulin aspart  0-5 Units Subcutaneous QHS  . insulin aspart  8 Units Subcutaneous TID WC  . insulin glargine  16 Units Subcutaneous QHS  . irbesartan  300 mg Oral Daily  . lamoTRIgine  100 mg Oral QPM  . levothyroxine  50 mcg Oral QAC breakfast  . methylPREDNISolone (SOLU-MEDROL) injection  0.5 mg/kg Intravenous Q12H   Followed by  . [START ON 02/10/2021] predniSONE  50 mg Oral Daily  . sodium chloride flush  3 mL Intravenous Q12H  . sodium chloride flush  3  mL Intravenous Q12H  . zinc sulfate  220 mg Oral Daily   Continuous Infusions: . sodium chloride    . remdesivir 100 mg in NS 100 mL 100 mg (02/09/21 0916)   PRN Meds:.sodium chloride, acetaminophen, albuterol, bisacodyl, chlorpheniramine-HYDROcodone, guaiFENesin-dextromethorphan, influenza vac split quadrivalent PF, ondansetron **OR** ondansetron (ZOFRAN) IV, oxyCODONE, polyethylene glycol, sodium chloride flush, sodium phosphate   Time Spent in minutes  25  See all Orders from today for  further details   Oren Binet M.D on 02/09/2021 at 3:28 PM  To page go to [email or web address] - use universal password  [facility] -  Office  [phone]    Objective:   Vitals:   02/08/21 1410 02/08/21 2024 02/09/21 0432 02/09/21 1230  BP: 137/77 136/89 (!) 135/92 (!) 156/98  Pulse: 71 80 66 85  Resp: 19 20 20 17   Temp: 98 F (36.7 C) 98.4 F (36.9 C) 98.2 F (36.8 C) 98 F (36.7 C)  TempSrc: Oral Oral Oral Oral  SpO2: 98% 96% 98% 90%  Weight:      Height:        Wt Readings from Last 3 Encounters:  02/07/21 75.3 kg  10/16/19 82.7 kg  05/16/17 77.1 kg     Intake/Output Summary (Last 24 hours) at 02/09/2021 1528 Last data filed at 02/09/2021 6222 Gross per 24 hour  Intake 520 ml  Output --  Net 520 ml     Physical Exam Gen Exam:Alert awake-not in any distress HEENT:atraumatic, normocephalic Chest: B/L clear to auscultation anteriorly CVS:S1S2 regular Abdomen:soft non tender, non distended Extremities:no edema Neurology: Non focal Skin: no rash   Data Review:    CBC Recent Labs  Lab 02/07/21 1148 02/08/21 0059 02/09/21 0204  WBC 4.8 5.5 8.8  HGB 13.9 13.5 13.3  HCT 44.5 44.0 42.4  PLT 320 308 395  MCV 85.6 85.6 85.0  MCH 26.7 26.3 26.7  MCHC 31.2 30.7 31.4  RDW 13.8 13.6 13.4  LYMPHSABS  --  0.8 1.3  MONOABS  --  0.0* 0.6  EOSABS  --  0.0 0.0  BASOSABS  --  0.0 0.0    Chemistries  Recent Labs  Lab 02/07/21 1148 02/07/21 1720 02/08/21 0059 02/09/21 0204  NA 135  --  136 135  K 4.0  --  4.3 4.3  CL 96*  --  98 98  CO2 28  --  29 26  GLUCOSE 451*  --  271* 226*  BUN 6  --  5* 13  CREATININE 0.61  --  0.46 0.56  CALCIUM 9.2  --  9.4 9.5  MG  --   --  1.9 2.0  AST  --  16 16 12*  ALT  --  13 14 13   ALKPHOS  --  109 89 87  BILITOT  --  0.9 0.5 0.5   ------------------------------------------------------------------------------------------------------------------ No results for input(s): CHOL, HDL, LDLCALC, TRIG, CHOLHDL,  LDLDIRECT in the last 72 hours.  Lab Results  Component Value Date   HGBA1C 12.4 (H) 02/07/2021   ------------------------------------------------------------------------------------------------------------------ Recent Labs    02/08/21 0059  TSH 0.601   ------------------------------------------------------------------------------------------------------------------ Recent Labs    02/08/21 0059 02/09/21 0204  FERRITIN 186 172    Coagulation profile No results for input(s): INR, PROTIME in the last 168 hours.  Recent Labs    02/08/21 0059 02/09/21 0204  DDIMER 0.74* 0.39    Cardiac Enzymes No results for input(s): CKMB, TROPONINI, MYOGLOBIN in the last 168 hours.  Invalid input(s): CK ------------------------------------------------------------------------------------------------------------------  Component Value Date/Time   BNP 20.1 02/07/2021 1148    Micro Results No results found for this or any previous visit (from the past 240 hour(s)).  Radiology Reports DG Chest 2 View  Result Date: 02/07/2021 CLINICAL DATA:  Shortness of breath.  Decreased oxygen saturation. EXAM: CHEST - 2 VIEW COMPARISON:  PA and lateral chest 10/13/2019. FINDINGS: Airspace disease in the right middle lobe and lingula appears worse in the right middle lobe. No pneumothorax or pleural effusion. Heart size is enlarged. No acute or focal bony abnormality. 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 IMPRESSION: Airspace disease in the right middle lobe and lingula worrisome for pneumonia. Cardiomegaly. Electronically Signed   By: Inge Rise M.D.   On: 02/07/2021 12:29

## 2021-02-09 NOTE — TOC Initial Note (Signed)
Transition of Care [facility]) - Initial/Assessment Note    Patient Details  Name: [name] MRN: [id number] Date of Birth: May 09, 1971  Transition of Care [facility]) CM/SW Contact:    Carles Collet, RN Phone Number: 02/09/2021, 2:02 PM  Clinical Narrative:                 Damaris Schooner w patient. DC to home anticipated in 1-2 days. Patient states she has concentrator at home, and portable oxygen for DC in room. She has oxygen through [employer]. Notified [employer] liaison of O2 order in case it is needed for insurance.  No other CM needs at this time.    Expected Discharge Plan: Home/Self Care Barriers to Discharge: Continued Medical Work up   Patient Goals and CMS Choice        Expected Discharge Plan and Services Expected Discharge Plan: Home/Self Care                                              Prior Living Arrangements/Services                       Activities of Daily Living Home Assistive Devices/Equipment: None ADL Screening (condition at time of admission) Patient's cognitive ability adequate to safely complete daily activities?: Yes Is the patient deaf or have difficulty hearing?: No Does the patient have difficulty seeing, even when wearing glasses/contacts?: No Does the patient have difficulty concentrating, remembering, or making decisions?: No Patient able to express need for assistance with ADLs?: No Does the patient have difficulty dressing or bathing?: No Independently performs ADLs?: Yes (appropriate for developmental age) Does the patient have difficulty walking or climbing stairs?: No Weakness of Legs: None Weakness of Arms/Hands: None  Permission Sought/Granted                  Emotional Assessment              Admission diagnosis:  Hypoxia [R09.02] Acute respiratory disease due to COVID-19 virus [U07.1, J06.9] COVID-19 [U07.1] Patient Active Problem List   Diagnosis Date Noted  . Acute respiratory disease due to COVID-19 virus  02/07/2021  . Acute respiratory failure with hypoxia ([place]) 10/14/2019  . Pulmonary artery hypertension ([place]) 10/14/2019  . Hypothyroidism 10/14/2019  . Fibroids 03/29/2017  . 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 SOB (shortness of breath) on exertion   . COPD without exacerbation ([place]) 09/06/2016  . Bipolar disorder ([place]) 09/06/2016  . Abnormal CT scan, chest 04/04/2016  . Morbid obesity ([place]) 03/14/2016  . Hypoxemia   . Pneumothorax   . Urinary tract infection, site not specified   . Chronic diastolic CHF (congestive heart failure) ([place])   . Type 2 diabetes mellitus with complication ([place])   . Essential hypertension   . 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 Uterine leiomyoma   . Acute respiratory failure ([place]) 02/17/2016  . Hypoxemic respiratory failure, chronic ([place]) 02/17/2016  . Diastolic CHF, chronic ([place]) 04/02/2013  . Obesity hypoventilation syndrome ([place]) 04/02/2013  . Abnormal EKG 03/31/2013  . Leukocytosis 03/31/2013  . HTN (hypertension) 03/31/2013  . Hyperlipidemia 03/31/2013  . Diabetes ([place]) 03/31/2013  . Vitamin D deficiency 03/31/2013  . Anemia 03/31/2013  . Bipolar I disorder, most recent episode (or current) manic ([place]) 03/17/2013   PCP:  Glendon Axe, MD Pharmacy:   [facility] [phone] -  [address], [address] [place] [address] Phone: [phone] Fax: [phone]     Social Determinants of Health (SDOH) Interventions    Readmission Risk Interventions No flowsheet data found.

## 2021-02-10 ENCOUNTER — Other Ambulatory Visit: Payer: Self-pay | Admitting: Internal Medicine

## 2021-02-10 LAB — CBC WITH DIFFERENTIAL/PLATELET
Abs Immature Granulocytes: 0.08 10*3/uL — ABNORMAL HIGH (ref 0.00–0.07)
Basophils Absolute: 0 10*3/uL (ref 0.0–0.1)
Basophils Relative: 0 %
Eosinophils Absolute: 0 10*3/uL (ref 0.0–0.5)
Eosinophils Relative: 0 %
HCT: 43.5 % (ref 36.0–46.0)
Hemoglobin: 13.3 g/dL (ref 12.0–15.0)
Immature Granulocytes: 1 %
Lymphocytes Relative: 29 %
Lymphs Abs: 2.7 10*3/uL (ref 0.7–4.0)
MCH: 25.9 pg — ABNORMAL LOW (ref 26.0–34.0)
MCHC: 30.6 g/dL (ref 30.0–36.0)
MCV: 84.8 fL (ref 80.0–100.0)
Monocytes Absolute: 1.1 10*3/uL — ABNORMAL HIGH (ref 0.1–1.0)
Monocytes Relative: 11 %
Neutro Abs: 5.6 10*3/uL (ref 1.7–7.7)
Neutrophils Relative %: 59 %
Platelets: 432 10*3/uL — ABNORMAL HIGH (ref 150–400)
RBC: 5.13 MIL/uL — ABNORMAL HIGH (ref 3.87–5.11)
RDW: 13.7 % (ref 11.5–15.5)
WBC: 9.5 10*3/uL (ref 4.0–10.5)
nRBC: 0 % (ref 0.0–0.2)

## 2021-02-10 LAB — COMPREHENSIVE METABOLIC PANEL
ALT: 13 U/L (ref 0–44)
AST: 11 U/L — ABNORMAL LOW (ref 15–41)
Albumin: 2.7 g/dL — ABNORMAL LOW (ref 3.5–5.0)
Alkaline Phosphatase: 73 U/L (ref 38–126)
Anion gap: 12 (ref 5–15)
BUN: 14 mg/dL (ref 6–20)
CO2: 28 mmol/L (ref 22–32)
Calcium: 9.3 mg/dL (ref 8.9–10.3)
Chloride: 98 mmol/L (ref 98–111)
Creatinine, Ser: 0.56 mg/dL (ref 0.44–1.00)
GFR, Estimated: >60 mL/min (ref >60)
Glucose, Bld: 80 mg/dL (ref 70–99)
Potassium: 3.9 mmol/L (ref 3.5–5.1)
Sodium: 138 mmol/L (ref 135–145)
Total Bilirubin: 0.6 mg/dL (ref 0.3–1.2)
Total Protein: 6.1 g/dL — ABNORMAL LOW (ref 6.5–8.1)

## 2021-02-10 LAB — C-REACTIVE PROTEIN: CRP: <0.5 mg/dL (ref <1.0)

## 2021-02-10 LAB — FERRITIN: Ferritin: 157 ng/mL (ref 11–307)

## 2021-02-10 LAB — MAGNESIUM: Magnesium: 1.9 mg/dL (ref 1.7–2.4)

## 2021-02-10 LAB — GLUCOSE, CAPILLARY
Glucose-Capillary: 119 mg/dL — ABNORMAL HIGH (ref 70–99)
Glucose-Capillary: 265 mg/dL — ABNORMAL HIGH (ref 70–99)

## 2021-02-10 LAB — D-DIMER, QUANTITATIVE: D-Dimer, Quant: <0.27 ug/mL-FEU (ref 0.00–0.50)

## 2021-02-10 LAB — PHOSPHORUS: Phosphorus: 4 mg/dL (ref 2.5–4.6)

## 2021-02-10 MED ORDER — BENZONATATE 100 MG PO CAPS: 100.0000 mg | ORAL_CAPSULE | Freq: Three times a day (TID) | ORAL | 0 refills | Status: DC | PRN

## 2021-02-10 MED ORDER — ALBUTEROL SULFATE HFA 108 (90 BASE) MCG/ACT IN AERS: 2.0000 | INHALATION_SPRAY | Freq: Four times a day (QID) | RESPIRATORY_TRACT | 0 refills | Status: AC | PRN

## 2021-02-10 MED ORDER — PREDNISONE 10 MG PO TABS: ORAL_TABLET | ORAL | 0 refills | Status: DC

## 2021-02-10 MED FILL — BENZONATATE 100 MG CAPS: 100 | 10 days supply | Qty: 30 | Fill #0

## 2021-02-10 MED FILL — ALBUTEROL SULFATE HFA 108 (: 108 (90 BAS | 25 days supply | Qty: 18 | Fill #0

## 2021-02-10 MED FILL — predniSONE 10 MG TABS: 10 | 3 days supply | Qty: 6 | Fill #0

## 2021-02-10 NOTE — Progress Notes (Signed)
Inpatient Diabetes Program Recommendations  AACE/ADA: New Consensus Statement on Inpatient Glycemic Control (2015)  Target Ranges:  Prepandial:   less than 140 mg/dL      Peak postprandial:   less than 180 mg/dL (1-2 hours)      Critically ill patients:  140 - 180 mg/dL   Lab Results  Component Value Date   GLUCAP 119 (H) 02/10/2021   HGBA1C 12.4 (H) 02/07/2021    Review of Glycemic Control Results for YUMA, BLUCHER (MRN [id number]) as of 02/10/2021 09:29  Ref. Range 02/09/2021 07:18 02/09/2021 12:29 02/09/2021 17:05 02/09/2021 21:19 02/10/2021 07:37  Glucose-Capillary Latest Ref Range: 70 - 99 mg/dL 183 (H) 324 (H) 399 (H) 304 (H) 119 (H)   Current orders for Inpatient glycemic control: Lantus 16 units QHS, Novolog 0-15 units TID & 0-5 units QHS, Novolog 8 units TID with meals, Prednisone 50 mg daily  Inpatient Diabetes Program Recommendations:    Novolog 12 units TID with meals if eats at least 50%.  Will continue to follow while inpatient.  Thank you, Reche Dixon, RN, BSN Diabetes Coordinator Inpatient Diabetes Program [phone] (team pager from 8a-5p)

## 2021-02-10 NOTE — Discharge Summary (Signed)
PATIENT DETAILS Name: [name] Age: 50 y.o. Sex: female Date of Birth: Feb 21, 1971 MRN: [id number]. Admitting Physician: Karmen Bongo, MD [id number], Florentina Jenny, MD  Admit Date: 02/07/2021 Discharge date: 02/10/2021  Recommendations for Outpatient Follow-up:  1. Follow up with PCP in 1-2 weeks 2. Please obtain CMP/CBC in one week 3. Reassess at next visit whether patient still requires home O2  Admitted From:  Home  Disposition: Union: No  Equipment/Devices: None  Discharge Condition: Stable  CODE STATUS: FULL CODE  Diet recommendation:  Diet Order            Diet - low sodium heart healthy           Diet Carb Modified Fluid consistency: Thin; Room service appropriate? Yes  Diet effective now                  Brief Narrative: Patient is a 50 y.o. female with PMHx of HTN, HLD, DM, chronic diastolic heart failure, COPD-presented with worsening shortness of breath-she was found to have acute hypoxic respiratory failure due to COVID-19 pneumonia.  She apparently tested positive at home on 2/14.  COVID-19 vaccinated status: Vaccinated but not boosted.  Significant Events: 2/21>> Admit to [facility] for hypoxia due to COVID-19  Significant studies: 2/21>>Chest x-ray: Right middle lobe airspace disease  COVID-19 medications: Steroids: 2/21>> Remdesivir: 2/21>>  Antibiotics: None  Microbiology data: None  Procedures: None  Consults: None  Brief Hospital Course: Acute Hypoxic Resp Failure due to Covid 19 Viral pneumonia:  Significantly better on room air at rest-however with ambulation requires around 2 L of oxygen.  Treated with steroid/Remdesivir-since clinically improved-we will discharge on steroid taper.  PCP to assess at next visit whether patient still requires home O2.  COVID-19 Labs:  Recent Labs    02/07/21 1720 02/08/21 0059 02/09/21 0204 02/10/21 0333  DDIMER 0.41 0.74* 0.39 <0.27  FERRITIN 172 186 172 157  LDH  207*  --   --   --   CRP 4.3* 3.3* 1.0* <0.5    Lab Results  Component Value Date   SARSCOV2NAA NEGATIVE 10/14/2019     COPD: Stable-apparently was on home O2 till September 2021-see above regarding plans to discharge back on home O2.  Bipolar disorder: Stable-continue Lamictal/Depakote  Hypothyroidism: Continue Synthroid-TSH stable.  Chronic diastolic heart failure: Compensated-follow closely.  HTN: BP stable-continue Norvasc/Avapro  DM-2 (A1c 12.4 on 2/21) with uncontrolled hyperglycemia due to steroids:  CBGs are much improved this morning after significant decrease in steroid dose-we will be on tapering steroids for a few more days.  Suspect okay to resume her usual hypoglycemic regimen on discharge.  Follow with PCP for further optimization.    Obesity: Estimated body mass index is 35.92 kg/m as calculated from the following:   Height as of this encounter: 4\' 9"  (1.448 m).   Weight as of this encounter: 75.3 kg.    Discharge Diagnoses:  Principal Problem:   Acute respiratory disease due to COVID-19 virus Active Problems:   Bipolar I disorder, most recent episode (or current) manic (HCC)   HTN (hypertension)   Hyperlipidemia   Diabetes (HCC)   Diastolic CHF, chronic (HCC)   Obesity hypoventilation syndrome (HCC)   COPD without exacerbation ([place])   Hypothyroidism   Discharge Instructions:    Person Under Monitoring Name: [name]  Location: [address] [place] [address]   Infection Prevention Recommendations for Individuals Confirmed to have, or Being Evaluated for, 2019 Novel Coronavirus (  COVID-19) Infection Who Receive Care at Home  Individuals who are confirmed to have, or are being evaluated for, COVID-19 should follow the prevention steps below until a healthcare provider or local or state health department says they can return to normal activities.  Stay home except to get medical care You should restrict activities  outside your home, except for getting medical care. Do not go to work, school, or public areas, and do not use public transportation or taxis.  Call ahead before visiting your doctor Before your medical appointment, call the healthcare provider and tell them that you have, or are being evaluated for, COVID-19 infection. This will help the healthcare provider's office take steps to keep other people from getting infected. Ask your healthcare provider to call the local or state health department.  Monitor your symptoms Seek prompt medical attention if your illness is worsening (e.g., difficulty breathing). Before going to your medical appointment, call the healthcare provider and tell them that you have, or are being evaluated for, COVID-19 infection. Ask your healthcare provider to call the local or state health department.  Wear a facemask You should wear a facemask that covers your nose and mouth when you are in the same room with other people and when you visit a healthcare provider. People who live with or visit you should also wear a facemask while they are in the same room with you.  Separate yourself from other people in your home As much as possible, you should stay in a different room from other people in your home. Also, you should use a separate bathroom, if available.  Avoid sharing household items You should not share dishes, drinking glasses, cups, eating utensils, towels, bedding, or other items with other people in your home. After using these items, you should wash them thoroughly with soap and water.  Cover your coughs and sneezes Cover your mouth and nose with a tissue when you cough or sneeze, or you can cough or sneeze into your sleeve. Throw used tissues in a lined trash can, and immediately wash your hands with soap and water for at least 20 seconds or use an alcohol-based hand rub.  Wash your [facility] your hands often and thoroughly with soap and water for at  least 20 seconds. You can use an alcohol-based hand sanitizer if soap and water are not available and if your hands are not visibly dirty. Avoid touching your eyes, nose, and mouth with unwashed hands.   Prevention Steps for Caregivers and Household Members of Individuals Confirmed to have, or Being Evaluated for, COVID-19 Infection Being Cared for in the Home  If you live with, or provide care at home for, a person confirmed to have, or being evaluated for, COVID-19 infection please follow these guidelines to prevent infection:  Follow healthcare provider's instructions Make sure that you understand and can help the patient follow any healthcare provider instructions for all care.  Provide for the patient's basic needs You should help the patient with basic needs in the home and provide support for getting groceries, prescriptions, and other personal needs.  Monitor the patient's symptoms If they are getting sicker, call his or her medical provider and tell them that the patient has, or is being evaluated for, COVID-19 infection. This will help the healthcare provider's office take steps to keep other people from getting infected. Ask the healthcare provider to call the local or state health department.  Limit the number of people who have contact with the patient  If possible, have only one caregiver for the patient.  Other household members should stay in another home or place of residence. If this is not possible, they should stay  in another room, or be separated from the patient as much as possible. Use a separate bathroom, if available.  Restrict visitors who do not have an essential need to be in the home.  Keep older adults, very young children, and other sick people away from the patient Keep older adults, very young children, and those who have compromised immune systems or chronic health conditions away from the patient. This includes people with chronic heart, lung, or  kidney conditions, diabetes, and cancer.  Ensure good ventilation Make sure that shared spaces in the home have good air flow, such as from an air conditioner or an opened window, weather permitting.  Wash your hands often  Wash your hands often and thoroughly with soap and water for at least 20 seconds. You can use an alcohol based hand sanitizer if soap and water are not available and if your hands are not visibly dirty.  Avoid touching your eyes, nose, and mouth with unwashed hands.  Use disposable paper towels to dry your hands. If not available, use dedicated cloth towels and replace them when they become wet.  Wear a facemask and gloves  Wear a disposable facemask at all times in the room and gloves when you touch or have contact with the patient's blood, body fluids, and/or secretions or excretions, such as sweat, saliva, sputum, nasal mucus, vomit, urine, or feces.  Ensure the mask fits over your nose and mouth tightly, and do not touch it during use.  Throw out disposable facemasks and gloves after using them. Do not reuse.  Wash your hands immediately after removing your facemask and gloves.  If your personal clothing becomes contaminated, carefully remove clothing and launder. Wash your hands after handling contaminated clothing.  Place all used disposable facemasks, gloves, and other waste in a lined container before disposing them with other household waste.  Remove gloves and wash your hands immediately after handling these items.  Do not share dishes, glasses, or other household items with the patient  Avoid sharing household items. You should not share dishes, drinking glasses, cups, eating utensils, towels, bedding, or other items with a patient who is confirmed to have, or being evaluated for, COVID-19 infection.  After the person uses these items, you should wash them thoroughly with soap and water.  Wash laundry thoroughly  Immediately remove and wash clothes  or bedding that have blood, body fluids, and/or secretions or excretions, such as sweat, saliva, sputum, nasal mucus, vomit, urine, or feces, on them.  Wear gloves when handling laundry from the patient.  Read and follow directions on labels of laundry or clothing items and detergent. In general, wash and dry with the warmest temperatures recommended on the label.  Clean all areas the individual has used often  Clean all touchable surfaces, such as counters, tabletops, doorknobs, bathroom fixtures, toilets, phones, keyboards, tablets, and bedside tables, every day. Also, clean any surfaces that may have blood, body fluids, and/or secretions or excretions on them.  Wear gloves when cleaning surfaces the patient has come in contact with.  Use a diluted bleach solution (e.g., dilute bleach with 1 part bleach and 10 parts water) or a household disinfectant with a label that says EPA-registered for coronaviruses. To make a bleach solution at home, add 1 tablespoon of bleach to 1 quart (4 cups)  of water. For a larger supply, add  cup of bleach to 1 gallon (16 cups) of water.  Read labels of cleaning products and follow recommendations provided on product labels. Labels contain instructions for safe and effective use of the cleaning product including precautions you should take when applying the product, such as wearing gloves or eye protection and making sure you have good ventilation during use of the product.  Remove gloves and wash hands immediately after cleaning.  Monitor yourself for signs and symptoms of illness Caregivers and household members are considered close contacts, should monitor their health, and will be asked to limit movement outside of the home to the extent possible. Follow the monitoring steps for close contacts listed on the symptom monitoring form.   ? If you have additional questions, contact your local health department or call the epidemiologist on call at  [phone] (available 24/7). ? This guidance is subject to change. For the most up-to-date guidance from CDC, please refer to their website: YouBlogs.pl    Activity:  As tolerated   Discharge Instructions    Call MD for:  difficulty breathing, headache or visual disturbances   Complete by: As directed    Diet - low sodium heart healthy   Complete by: As directed    Discharge instructions   Complete by: As directed    1.)  21 days of isolation from the day of your first positive test or from the first day of your symptoms  2.)  If you develop worsening shortness of breath-please seek immediate medical attention.  3.)  Please use home O2 as instructed-please ask your primary care practitioner at next follow-up to see if you still require home O2.   Follow with Primary MD  Glendon Axe, MD in 1-2 weeks  Please get a complete blood count and chemistry panel checked by your Primary MD at your next visit, and again as instructed by your Primary MD.  Get Medicines reviewed and adjusted: Please take all your medications with you for your next visit with your Primary MD  Laboratory/radiological data: Please request your Primary MD to go over all hospital tests and procedure/radiological results at the follow up, please ask your Primary MD to get all Hospital records sent to his/her office.  In some cases, they will be blood work, cultures and biopsy results pending at the time of your discharge. Please request that your primary care M.D. follows up on these results.  Also Note the following: If you experience worsening of your admission symptoms, develop shortness of breath, life threatening emergency, suicidal or homicidal thoughts you must seek medical attention immediately by calling 911 or calling your MD immediately  if symptoms less severe.  You must read complete instructions/literature along with all the possible  adverse reactions/side effects for all the Medicines you take and that have been prescribed to you. Take any new Medicines after you have completely understood and accpet all the possible adverse reactions/side effects.   Do not drive when taking Pain medications or sleeping medications (Benzodaizepines)  Do not take more than prescribed Pain, Sleep and Anxiety Medications. It is not advisable to combine anxiety,sleep and pain medications without talking with your primary care practitioner  Special Instructions: If you have smoked or chewed Tobacco  in the last 2 yrs please stop smoking, stop any regular Alcohol  and or any Recreational drug use.  Wear Seat belts while driving.  Please note: You were cared for by a hospitalist during your hospital  stay. Once you are discharged, your primary care physician will handle any further medical issues. Please note that NO REFILLS for any discharge medications will be authorized once you are discharged, as it is imperative that you return to your primary care physician (or establish a relationship with a primary care physician if you do not have one) for your post hospital discharge needs so that they can reassess your need for medications and monitor your lab values.   Increase activity slowly   Complete by: As directed      Allergies as of 02/10/2021      Reactions   Abilify [aripiprazole] Other (See Comments)   Reaction:  Hyperglycemia      Medication List    TAKE these medications   albuterol 108 (90 Base) MCG/ACT inhaler Commonly known as: VENTOLIN HFA Inhale 2 puffs into the lungs every 6 (six) hours as needed for wheezing or shortness of breath.   amLODipine 10 MG tablet Commonly known as: NORVASC Take 10 mg by mouth daily.   benzonatate 100 MG capsule Commonly known as: Tessalon Perles Take 1 capsule (100 mg total) by mouth 3 (three) times daily as needed for cough.   divalproex 500 MG 24 hr tablet Commonly known as: DEPAKOTE  ER Take 500 mg by mouth at bedtime.   FreeStyle Libre 14 Day Sensor Misc 1 each by Other route as directed.   irbesartan 300 MG tablet Commonly known as: AVAPRO Take 300 mg by mouth daily.   lamoTRIgine 200 MG tablet Commonly known as: LAMICTAL Take 100 mg by mouth every evening.   levothyroxine 50 MCG tablet Commonly known as: SYNTHROID Take 50 mcg by mouth daily before breakfast.   metFORMIN 1000 MG tablet Commonly known as: GLUCOPHAGE Take 1,000 mg by mouth 2 (two) times daily with a meal.   predniSONE 10 MG tablet Commonly known as: DELTASONE Take 30 mg daily for 1 day, 20 mg daily for 1 days,10 mg daily for 1 day, then stop   Trulicity 3 ZO/1.0RU Sopn Generic drug: Dulaglutide Inject 0.5 mLs into the skin once a week. Sundays   Vitamin D (Ergocalciferol) 1.25 MG (50000 UNIT) Caps capsule Commonly known as: DRISDOL Take 50,000 Units by mouth once a week. Fridays            Durable Medical Equipment  (From admission, onward)         Start     Ordered   02/09/21 1353  For home use only DME oxygen  Once       Question Answer Comment  Length of Need 6 Months   Mode or (Route) Nasal cannula   Liters per Minute 2   Frequency Continuous (stationary and portable oxygen unit needed)   Oxygen conserving device Yes   Oxygen delivery system Gas      02 /23/22 1352          Follow-up Information    Glendon Axe, MD. Schedule an appointment as soon as possible for a visit in 1 week(s).   Specialty: Family Medicine Contact information: [place] [address] [phone]              Allergies  Allergen Reactions  . Abilify [Aripiprazole] Other (See Comments)    Reaction:  Hyperglycemia     Other Procedures/Studies: DG Chest 2 View  Result Date: 02/07/2021 CLINICAL DATA:  Shortness of breath.  Decreased oxygen saturation. EXAM: CHEST - 2 VIEW COMPARISON:  PA and lateral chest 10/13/2019. FINDINGS: Airspace  disease in the right  middle lobe and lingula appears worse in the right middle lobe. No pneumothorax or pleural effusion. Heart size is enlarged. No acute or focal bony abnormality. 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 IMPRESSION: Airspace disease in the right middle lobe and lingula worrisome for pneumonia. Cardiomegaly. Electronically Signed   By: Inge Rise M.D.   On: 02/07/2021 12:29     TODAY-DAY OF DISCHARGE:  Subjective:   [name] today has no headache,no chest abdominal pain,no new weakness tingling or numbness, feels much better wants to go home today.   Objective:   Blood pressure (!) 129/99, pulse 74, temperature 98.4 F (36.9 C), temperature source Oral, resp. rate 18, height 4\' 9"  (1.448 m), weight 75.3 kg, SpO2 90 %.  Intake/Output Summary (Last 24 hours) at 02/10/2021 0959 Last data filed at 02/09/2021 2130 Gross per 24 hour  Intake 620 ml  Output -  Net 620 ml   Filed Weights   02/07/21 1700  Weight: 75.3 kg    Exam: Awake Alert, Oriented *3, No new F.N deficits, Normal affect [place].AT,PERRAL Supple Neck,No JVD, No cervical lymphadenopathy appriciated.  Symmetrical Chest wall movement, Good air movement bilaterally, CTAB RRR,No Gallops,Rubs or new Murmurs, No Parasternal Heave +ve B.Sounds, Abd Soft, Non tender, No organomegaly appriciated, No rebound -guarding or rigidity. No Cyanosis, Clubbing or edema, No new Rash or bruise   PERTINENT RADIOLOGIC STUDIES: DG Chest 2 View  Result Date: 02/07/2021 CLINICAL DATA:  Shortness of breath.  Decreased oxygen saturation. EXAM: CHEST - 2 VIEW COMPARISON:  PA and lateral chest 10/13/2019. FINDINGS: Airspace disease in the right middle lobe and lingula appears worse in the right middle lobe. No pneumothorax or pleural effusion. Heart size is enlarged. No acute or focal bony abnormality. 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 IMPRESSION: Airspace disease in the right middle lobe and lingula worrisome for pneumonia.  Cardiomegaly. Electronically Signed   By: Inge Rise M.D.   On: 02/07/2021 12:29     PERTINENT LAB RESULTS: CBC: Recent Labs    02/09/21 0204 02/10/21 0333  WBC 8.8 9.5  HGB 13.3 13.3  HCT 42.4 43.5  PLT 395 432*   CMET CMP     Component Value Date/Time   NA 138 02/10/2021 0333   K 3.9 02/10/2021 0333   CL 98 02/10/2021 0333   CO2 28 02/10/2021 0333   GLUCOSE 80 02/10/2021 0333   BUN 14 02/10/2021 0333   CREATININE 0.56 02/10/2021 0333   CALCIUM 9.3 02/10/2021 0333   PROT 6.1 (L) 02/10/2021 0333   ALBUMIN 2.7 (L) 02/10/2021 0333   AST 11 (L) 02/10/2021 0333   ALT 13 02/10/2021 0333   ALKPHOS 73 02/10/2021 0333   BILITOT 0.6 02/10/2021 0333   GFRNONAA >60 02/10/2021 0333   GFRAA >60 10/15/2019 0410    GFR Estimated Creatinine Clearance: 70 mL/min (by C-G formula based on SCr of 0.56 mg/dL). No results for input(s): LIPASE, AMYLASE in the last 72 hours. No results for input(s): CKTOTAL, CKMB, CKMBINDEX, TROPONINI in the last 72 hours. Invalid input(s): [place]    02/09/21 0204 02/10/21 0333  DDIMER 0.39 <0.27   Recent Labs    02/07/21 1720  HGBA1C 12.4*   No results for input(s): CHOL, HDL, LDLCALC, TRIG, CHOLHDL, LDLDIRECT in the last 72 hours. Recent Labs    02/08/21 0059  TSH 0.601   Recent Labs    02/09/21 0204 02/10/21 0333  FERRITIN 172 157   Coags: No results for input(s): INR  in the last 72 hours.  Invalid input(s): PT Microbiology: No results found for this or any previous visit (from the past 240 hour(s)).  FURTHER DISCHARGE INSTRUCTIONS:  Get Medicines reviewed and adjusted: Please take all your medications with you for your next visit with your Primary MD  Laboratory/radiological data: Please request your Primary MD to go over all hospital tests and procedure/radiological results at the follow up, please ask your Primary MD to get all Hospital records sent to his/her office.  In some cases, they will be blood  work, cultures and biopsy results pending at the time of your discharge. Please request that your primary care M.D. goes through all the records of your hospital data and follows up on these results.  Also Note the following: If you experience worsening of your admission symptoms, develop shortness of breath, life threatening emergency, suicidal or homicidal thoughts you must seek medical attention immediately by calling 911 or calling your MD immediately  if symptoms less severe.  You must read complete instructions/literature along with all the possible adverse reactions/side effects for all the Medicines you take and that have been prescribed to you. Take any new Medicines after you have completely understood and accpet all the possible adverse reactions/side effects.   Do not drive when taking Pain medications or sleeping medications (Benzodaizepines)  Do not take more than prescribed Pain, Sleep and Anxiety Medications. It is not advisable to combine anxiety,sleep and pain medications without talking with your primary care practitioner  Special Instructions: If you have smoked or chewed Tobacco  in the last 2 yrs please stop smoking, stop any regular Alcohol  and or any Recreational drug use.  Wear Seat belts while driving.  Please note: You were cared for by a hospitalist during your hospital stay. Once you are discharged, your primary care physician will handle any further medical issues. Please note that NO REFILLS for any discharge medications will be authorized once you are discharged, as it is imperative that you return to your primary care physician (or establish a relationship with a primary care physician if you do not have one) for your post hospital discharge needs so that they can reassess your need for medications and monitor your lab values.  Total Time spent coordinating discharge including counseling, education and face to face time equals 35 minutes.  SignedOren Binet 02/10/2021 9:59 AM

## 2021-02-10 NOTE — Discharge Instructions (Signed)
Person Under Monitoring Name: [name]  Location: [address] [place] [address]   Infection Prevention Recommendations for Individuals Confirmed to have, or Being Evaluated for, 2019 Novel Coronavirus (COVID-19) Infection Who Receive Care at Home  Individuals who are confirmed to have, or are being evaluated for, COVID-19 should follow the prevention steps below until a healthcare provider or local or state health department says they can return to normal activities.  Stay home except to get medical care You should restrict activities outside your home, except for getting medical care. Do not go to work, school, or public areas, and do not use public transportation or taxis.  Call ahead before visiting your doctor Before your medical appointment, call the healthcare provider and tell them that you have, or are being evaluated for, COVID-19 infection. This will help the healthcare provider's office take steps to keep other people from getting infected. Ask your healthcare provider to call the local or state health department.  Monitor your symptoms Seek prompt medical attention if your illness is worsening (e.g., difficulty breathing). Before going to your medical appointment, call the healthcare provider and tell them that you have, or are being evaluated for, COVID-19 infection. Ask your healthcare provider to call the local or state health department.  Wear a facemask You should wear a facemask that covers your nose and mouth when you are in the same room with other people and when you visit a healthcare provider. People who live with or visit you should also wear a facemask while they are in the same room with you.  Separate yourself from other people in your home As much as possible, you should stay in a different room from other people in your home. Also, you should use a separate bathroom, if available.  Avoid sharing household items You  should not share dishes, drinking glasses, cups, eating utensils, towels, bedding, or other items with other people in your home. After using these items, you should wash them thoroughly with soap and water.  Cover your coughs and sneezes Cover your mouth and nose with a tissue when you cough or sneeze, or you can cough or sneeze into your sleeve. Throw used tissues in a lined trash can, and immediately wash your hands with soap and water for at least 20 seconds or use an alcohol-based hand rub.  Wash your [facility] your hands often and thoroughly with soap and water for at least 20 seconds. You can use an alcohol-based hand sanitizer if soap and water are not available and if your hands are not visibly dirty. Avoid touching your eyes, nose, and mouth with unwashed hands.   Prevention Steps for Caregivers and Household Members of Individuals Confirmed to have, or Being Evaluated for, COVID-19 Infection Being Cared for in the Home  If you live with, or provide care at home for, a person confirmed to have, or being evaluated for, COVID-19 infection please follow these guidelines to prevent infection:  Follow healthcare provider's instructions Make sure that you understand and can help the patient follow any healthcare provider instructions for all care.  Provide for the patient's basic needs You should help the patient with basic needs in the home and provide support for getting groceries, prescriptions, and other personal needs.  Monitor the patient's symptoms If they are getting sicker, call his or her medical provider and tell them that the patient has, or is being evaluated for, COVID-19 infection. This will help the healthcare provider's  office take steps to keep other people from getting infected. Ask the healthcare provider to call the local or state health department.  Limit the number of people who have contact with the patient  If possible, have only one caregiver for the  patient.  Other household members should stay in another home or place of residence. If this is not possible, they should stay  in another room, or be separated from the patient as much as possible. Use a separate bathroom, if available.  Restrict visitors who do not have an essential need to be in the home.  Keep older adults, very young children, and other sick people away from the patient Keep older adults, very young children, and those who have compromised immune systems or chronic health conditions away from the patient. This includes people with chronic heart, lung, or kidney conditions, diabetes, and cancer.  Ensure good ventilation Make sure that shared spaces in the home have good air flow, such as from an air conditioner or an opened window, weather permitting.  Wash your hands often  Wash your hands often and thoroughly with soap and water for at least 20 seconds. You can use an alcohol based hand sanitizer if soap and water are not available and if your hands are not visibly dirty.  Avoid touching your eyes, nose, and mouth with unwashed hands.  Use disposable paper towels to dry your hands. If not available, use dedicated cloth towels and replace them when they become wet.  Wear a facemask and gloves  Wear a disposable facemask at all times in the room and gloves when you touch or have contact with the patient's blood, body fluids, and/or secretions or excretions, such as sweat, saliva, sputum, nasal mucus, vomit, urine, or feces.  Ensure the mask fits over your nose and mouth tightly, and do not touch it during use.  Throw out disposable facemasks and gloves after using them. Do not reuse.  Wash your hands immediately after removing your facemask and gloves.  If your personal clothing becomes contaminated, carefully remove clothing and launder. Wash your hands after handling contaminated clothing.  Place all used disposable facemasks, gloves, and other waste in a lined  container before disposing them with other household waste.  Remove gloves and wash your hands immediately after handling these items.  Do not share dishes, glasses, or other household items with the patient  Avoid sharing household items. You should not share dishes, drinking glasses, cups, eating utensils, towels, bedding, or other items with a patient who is confirmed to have, or being evaluated for, COVID-19 infection.  After the person uses these items, you should wash them thoroughly with soap and water.  Wash laundry thoroughly  Immediately remove and wash clothes or bedding that have blood, body fluids, and/or secretions or excretions, such as sweat, saliva, sputum, nasal mucus, vomit, urine, or feces, on them.  Wear gloves when handling laundry from the patient.  Read and follow directions on labels of laundry or clothing items and detergent. In general, wash and dry with the warmest temperatures recommended on the label.  Clean all areas the individual has used often  Clean all touchable surfaces, such as counters, tabletops, doorknobs, bathroom fixtures, toilets, phones, keyboards, tablets, and bedside tables, every day. Also, clean any surfaces that may have blood, body fluids, and/or secretions or excretions on them.  Wear gloves when cleaning surfaces the patient has come in contact with.  Use a diluted bleach solution (e.g., dilute bleach with 1  part bleach and 10 parts water) or a household disinfectant with a label that says EPA-registered for coronaviruses. To make a bleach solution at home, add 1 tablespoon of bleach to 1 quart (4 cups) of water. For a larger supply, add  cup of bleach to 1 gallon (16 cups) of water.  Read labels of cleaning products and follow recommendations provided on product labels. Labels contain instructions for safe and effective use of the cleaning product including precautions you should take when applying the product, such as wearing gloves or  eye protection and making sure you have good ventilation during use of the product.  Remove gloves and wash hands immediately after cleaning.  Monitor yourself for signs and symptoms of illness Caregivers and household members are considered close contacts, should monitor their health, and will be asked to limit movement outside of the home to the extent possible. Follow the monitoring steps for close contacts listed on the symptom monitoring form.   ? If you have additional questions, contact your local health department or call the epidemiologist on call at [phone] (available 24/7). ? This guidance is subject to change. For the most up-to-date guidance from [facility], please refer to their website: [email or web address]

## 2021-02-10 NOTE — Progress Notes (Signed)
Physical Therapy Treatment & Discharge Patient Details Name: [name] MRN: [id number] DOB: 05-11-1971 Today's Date: 02/10/2021    History of Present Illness Pt is a 50 y.o. female admitted 02/07/21 with SOB, cough and hypoxia. Workup for acute hypoxic respiratory failure due to COVID-19 PNA. PMH includes COPD, HTN, DM, CHF, obesity, bipolar.   PT Comments    Pt progressing well with mobility. Independent with transfers, ambulation and ADL tasks. Noted improvements in SOB and coughing this session, although both still present with activity. SpO2 88-92% on RA when pleth reliable; DOE 2/4 with ambulation. Reviewed educ re: activity recommendations, energy conservation, IS/flutter valve use, and importance of mobility. Pt has met short-term acute PT goals, reports no further questions or concerns. Preparing for d/c home today. Will d/c acute PT.    Follow Up Recommendations  No PT follow up     Equipment Recommendations  None recommended by PT    Recommendations for Other Services       Precautions / Restrictions Precautions Precautions: Other (comment) Precaution Comments: monitor SpO2 Restrictions Weight Bearing Restrictions: No    Mobility  Bed Mobility               General bed mobility comments: pt sitting in chair at sink    Transfers Overall transfer level: Independent Equipment used: None                Ambulation/Gait Ambulation/Gait assistance: Independent Gait Distance (Feet): 350 Feet Assistive device: None Gait Pattern/deviations: Step-through pattern;Decreased stride length Gait velocity: Decreased Gait velocity interpretation: 1.31 - 2.62 ft/sec, indicative of limited community ambulator General Gait Details: Slow, steady ambulation independent without DME; difficulty getting reliable pulse ox read; when pleth reliable, reading 88-92% on RA; pt with DOE 2/4, minimal coughing   Stairs             Wheelchair Mobility    Modified  Rankin (Stroke Patients Only)       Balance Overall balance assessment: No apparent balance deficits (not formally assessed)                                          Cognition Arousal/Alertness: Awake/alert Behavior During Therapy: WFL for tasks assessed/performed Overall Cognitive Status: Within Functional Limits for tasks assessed                                        Exercises Other Exercises Other Exercises: Incentive spirometer x6 (pulling ~500-723m with good technique); flutter valve x5 (cues for technique) - pt with difficulty due to cough    General Comments        Pertinent Vitals/Pain Pain Assessment: No/denies pain    Home Living                      Prior Function            PT Goals (current goals can now be found in the care plan section) Progress towards PT goals: Goals met/education completed, patient discharged from PT    Frequency    Min 3X/week      PT Plan Current plan remains appropriate    Co-evaluation              AM-PAC PT "6 Clicks" Mobility   Outcome Measure  Help needed turning from your back to your side while in a flat bed without using bedrails?: None Help needed moving from lying on your back to sitting on the side of a flat bed without using bedrails?: None Help needed moving to and from a bed to a chair (including a wheelchair)?: None Help needed standing up from a chair using your arms (e.g., wheelchair or bedside chair)?: None Help needed to walk in hospital room?: None Help needed climbing 3-5 steps with a railing? : None 6 Click Score: 24    End of Session   Activity Tolerance: Patient tolerated treatment well Patient left: in chair;with call bell/phone within reach Nurse Communication: Mobility status PT Visit Diagnosis: Other abnormalities of gait and mobility (R26.89)     Time: 2233-6122 PT Time Calculation (min) (ACUTE ONLY): 12 min  Charges:  $Therapeutic  Exercise: 8-22 mins                     Mabeline Caras, PT, DPT Acute Rehabilitation Services  Pager [phone] Office [place] 02/10/2021, 12:17 PM

## 2021-02-10 NOTE — Progress Notes (Signed)
SATURATION QUALIFICATIONS: (This note is used to comply with regulatory documentation for home oxygen)  Patient Saturations on Room Air at Rest = 92%  Patient Saturations on Room Air while Ambulating = 88% (when pleth reliable)  Patient Saturations on -- Liters of oxygen while Ambulating = N/A  Mabeline Caras, PT, DPT Acute Rehabilitation Services  Pager [phone] Office [phone]

## 2021-06-24 ENCOUNTER — Ambulatory Visit: Payer: Federal, State, Local not specified - PPO | Admitting: Endocrinology

## 2021-06-24 ENCOUNTER — Other Ambulatory Visit: Payer: Self-pay

## 2021-06-24 VITALS — BP 158/84 | HR 81 | Ht <= 58 in | Wt 173.6 lb

## 2021-06-24 DIAGNOSIS — E118 Type 2 diabetes mellitus with unspecified complications: Secondary | ICD-10-CM | POA: Diagnosis not present

## 2021-06-24 LAB — POCT GLYCOSYLATED HEMOGLOBIN (HGB A1C): Hemoglobin A1C: 8.2 % — AB (ref 4.0–5.6)

## 2021-06-24 MED ORDER — METFORMIN HCL ER 500 MG PO TB24: 2000.0000 mg | ORAL_TABLET | Freq: Every day | ORAL | 3 refills | Status: DC

## 2021-06-24 MED ORDER — TRULICITY 4.5 MG/0.5ML ~~LOC~~ SOAJ: 4.5000 mg | SUBCUTANEOUS | 3 refills | Status: DC

## 2021-06-24 MED ORDER — EMPAGLIFLOZIN 25 MG PO TABS: 25.0000 mg | ORAL_TABLET | Freq: Every day | ORAL | 3 refills | Status: DC

## 2021-06-24 NOTE — Progress Notes (Signed)
Subjective:    Patient ID: [name], female    DOB: Mar 21, 1971, 50 y.o.   MRN: [id number]  HPI pt is referred by Dr Garwin Brothers, for diabetes.  Pt states DM was dx'ed in 2007 (she had GDM in 2001); she is unaware of any chronic complications; she took insulin 2007-2010; pt says her diet and exercise are not good; she has never had pancreatitis, pancreatic surgery, severe hypoglycemia or DKA.  She says cbg's are approx 100.  She takes Trulicity and 2 oral meds.   Past Medical History:  Diagnosis Date   Anemia microcytic 03/31/2013   Bipolar disorder ([place]) 03/31/2013   CHF (congestive heart failure) (HCC)    Diabetes mellitus without complication ([place])    Hyperlipidemia 03/31/2013   Hypertension    Obesity hypoventilation syndrome (HCC)    Plantar fasciitis of left foot    Pneumonia    Pneumothorax    Pulmonary infiltrates    Rhabdomyolysis 03/31/2013   [facility] secondary to abilify   Vitamin D deficiency 03/31/2013    Past Surgical History:  Procedure Laterality Date   CESAREAN SECTION     x2   CYST EXCISION     DILATATION & CURETTAGE/HYSTEROSCOPY WITH MYOSURE N/A 02/17/2016   Procedure: DILATATION & CURETTAGE/HYSTEROSCOPY/Myomectomy WITH MYOSURE;  Surgeon: Aloha Gell, MD;  Location: [facility];  Service: Gynecology;  Laterality: N/A;   IR ANGIOGRAM PELVIS SELECTIVE OR SUPRASELECTIVE  03/29/2017   IR ANGIOGRAM PELVIS SELECTIVE OR SUPRASELECTIVE  03/29/2017   IR ANGIOGRAM SELECTIVE EACH ADDITIONAL VESSEL  03/29/2017   IR ANGIOGRAM SELECTIVE EACH ADDITIONAL VESSEL  03/29/2017   IR EMBO TUMOR ORGAN ISCHEMIA INFARCT INC GUIDE ROADMAPPING  03/29/2017   IR GENERIC HISTORICAL  11/14/2016   IR RADIOLOGIST EVAL & MGMT 11/14/2016 Greggory Keen, MD [facility] INTERV RAD   IR RADIOLOGIST EVAL & MGMT  05/16/2017   IR US GUIDE VASC ACCESS RIGHT  03/29/2017   MYOMECTOMY N/A 02/17/2016   Procedure: Amado Coe WITH Jacklynn Barnacle;  Surgeon: Aloha Gell, MD;  Location: [facility];  Service: Gynecology;  Laterality: N/A;    VENTRICULOPERITONEAL SHUNT      Social History   Socioeconomic History   Marital status: Married    Spouse name: Not on file   Number of children: 2   Years of education: Not on file   Highest education level: Not on file  Occupational History   Occupation: Postal service    Employer: [employer]  Tobacco Use   Smoking status: Never   Smokeless tobacco: Never  Substance and Sexual Activity   Alcohol use: No   Drug use: No   Sexual activity: Yes    Birth control/protection: None  Other Topics Concern   Not on file  Social History Narrative   Not on file   Social Determinants of Health   Financial Resource Strain: Not on file  Food Insecurity: Not on file  Transportation Needs: Not on file  Physical Activity: Not on file  Stress: Not on file  Social Connections: Not on file  Intimate Partner Violence: Not on file    Current Outpatient Medications on File Prior to Visit  Medication Sig Dispense Refill   albuterol (VENTOLIN HFA) 108 (90 Base) MCG/ACT inhaler Inhale 2 puffs into the lungs every 6 (six) hours as needed for wheezing or shortness of breath. 6.7 g 0   albuterol (VENTOLIN HFA) 108 (90 Base) MCG/ACT inhaler INHALE 2 PUFFS INTO THE LUNGS EVERY SIX HOURS AS NEEDED FOR WHEEZING OR SHORTNESS OF BREATH. 18  g 0   amLODipine (NORVASC) 10 MG tablet Take 10 mg by mouth daily.     Continuous Blood Gluc Sensor (FREESTYLE LIBRE 14 DAY SENSOR) MISC 1 each by Other route as directed.     divalproex (DEPAKOTE ER) 500 MG 24 hr tablet Take 500 mg by mouth at bedtime.     irbesartan (AVAPRO) 300 MG tablet Take 300 mg by mouth daily.  3   lamoTRIgine (LAMICTAL) 200 MG tablet Take 100 mg by mouth every evening.     levothyroxine (SYNTHROID, LEVOTHROID) 50 MCG tablet Take 50 mcg by mouth daily before breakfast.     No current facility-administered medications on file prior to visit.    Allergies  Allergen Reactions   Abilify [Aripiprazole] Other (See Comments)     Reaction:  Hyperglycemia    Family History  Problem Relation Age of Onset   Diabetes Mother    Hyperlipidemia Mother    Hypertension Mother    Lung cancer Father    Hypertension Brother     BP (!) 158/84 (BP Location: Right Arm, Patient Position: Sitting, Cuff Size: Normal)   Pulse 81   Ht 4\' 9"  (1.448 m)   Wt 173 lb 9.6 oz (78.7 kg)   SpO2 98%   BMI 37.57 kg/m    Review of Systems denies weight loss, sob, n/v.  Depression is well-controlled.       Objective:   Physical Exam Pulses: dorsalis pedis intact bilat.   MSK: no deformity of the feet CV: no leg edema Skin:  no ulcer on the feet.  normal color and temp on the feet.   Neuro: sensation is intact to touch on the feet.    Lab Results  Component Value Date   CREATININE 0.56 02/10/2021   BUN 14 02/10/2021   NA 138 02/10/2021   K 3.9 02/10/2021   CL 98 02/10/2021   CO2 28 02/10/2021   Lab Results  Component Value Date   HGBA1C 8.2 (A) 06/24/2021   I have reviewed outside records, and summarized: Pt was noted to have elevated A1c, and referred here.  Glycemic control is much better off steroids.      Assessment & Plan:  Type 2 DM: uncontrolled   Patient Instructions  good diet and exercise significantly improve the control of your diabetes.  please let me know if you wish to be referred to a dietician.  high blood sugar is very risky to your health.  you should see an eye doctor and dentist every year.  It is very important to get all recommended vaccinations.  Controlling your blood pressure and cholesterol drastically reduces the damage diabetes does to your body.  Those who smoke should quit.  Please discuss these with your doctor.  check your blood sugar once a day.  vary the time of day when you check, between before the 3 meals, and at bedtime.  also check if you have symptoms of your blood sugar being too high or too low.  please keep a record of the readings and bring it to your next appointment here (or  you can bring the meter itself).  You can write it on any piece of paper.  please call us sooner if your blood sugar goes below 70, or if most of your readings are over 200. I have sent 3 prescriptions to your pharmacy: to increase the Jardiance, increase the Trulicity, and to change the metformin to extended-release.   Please come back for a follow-up appointment  in 3 months.

## 2021-06-24 NOTE — Patient Instructions (Addendum)
good diet and exercise significantly improve the control of your diabetes.  please let me know if you wish to be referred to a dietician.  high blood sugar is very risky to your health.  you should see an eye doctor and dentist every year.  It is very important to get all recommended vaccinations.  Controlling your blood pressure and cholesterol drastically reduces the damage diabetes does to your body.  Those who smoke should quit.  Please discuss these with your doctor.  check your blood sugar once a day.  vary the time of day when you check, between before the 3 meals, and at bedtime.  also check if you have symptoms of your blood sugar being too high or too low.  please keep a record of the readings and bring it to your next appointment here (or you can bring the meter itself).  You can write it on any piece of paper.  please call us sooner if your blood sugar goes below 70, or if most of your readings are over 200. I have sent 3 prescriptions to your pharmacy: to increase the Jardiance, increase the Trulicity, and to change the metformin to extended-release.   Please come back for a follow-up appointment in 3 months.

## 2021-06-28 ENCOUNTER — Other Ambulatory Visit: Payer: Self-pay

## 2021-06-28 ENCOUNTER — Ambulatory Visit: Payer: Federal, State, Local not specified - PPO

## 2021-06-28 ENCOUNTER — Telehealth: Payer: Self-pay | Admitting: Nutrition

## 2021-06-28 ENCOUNTER — Encounter: Payer: Self-pay | Admitting: Endocrinology

## 2021-06-28 NOTE — Telephone Encounter (Signed)
Patient was shown how to set up the [place] app on her phone.  This was then linked to our practice.  Her sensor is currently linked to the reader, and she was told that when she starts a new sensor, she will do this on her phone.  She was shown how to do this, and reported good understanding of this.  She is currently using the [place] 14 day sensor and would like the [place] 2 with the alarms.  She will call us when she runs out of sensors to order new ones.  Elenor Legato was downloaded and put on Dr. Cordelia Pen desk

## 2021-09-26 ENCOUNTER — Ambulatory Visit (INDEPENDENT_AMBULATORY_CARE_PROVIDER_SITE_OTHER): Payer: Federal, State, Local not specified - PPO | Admitting: Endocrinology

## 2021-09-26 ENCOUNTER — Other Ambulatory Visit: Payer: Self-pay

## 2021-09-26 VITALS — BP 140/100 | HR 74 | Ht <= 58 in | Wt 170.6 lb

## 2021-09-26 DIAGNOSIS — E118 Type 2 diabetes mellitus with unspecified complications: Secondary | ICD-10-CM | POA: Diagnosis not present

## 2021-09-26 LAB — POCT GLYCOSYLATED HEMOGLOBIN (HGB A1C): Hemoglobin A1C: 8.5 % — AB (ref 4.0–5.6)

## 2021-09-26 MED ORDER — REPAGLINIDE 2 MG PO TABS: 2.0000 mg | ORAL_TABLET | Freq: Two times a day (BID) | ORAL | 3 refills | Status: DC

## 2021-09-26 NOTE — Patient Instructions (Addendum)
Your blood pressure is high today.  Please see your primary care provider soon, to have it rechecked. check your blood sugar once a day.  vary the time of day when you check, between before the 3 meals, and at bedtime.  also check if you have symptoms of your blood sugar being too high or too low.  please keep a record of the readings and bring it to your next appointment here (or you can bring the meter itself).  You can write it on any piece of paper.  please call us sooner if your blood sugar goes below 70, or if most of your readings are over 200. I have sent a prescription to your pharmacy, to add repaglinide.   Please continue the same other medications.  Please come back for a follow-up appointment in 2 months.

## 2021-09-26 NOTE — Progress Notes (Signed)
Subjective:    Patient ID: [name], female    DOB: June 10, 1971, 50 y.o.   MRN: [id number]  HPI Pt returns for f/u of diabetes mellitus: DM type: 2 Dx'ed: 2683 Complications: none Therapy: Trulicity and 2 oral meds. GDM: 2001 DKA: never Severe hypoglycemia: never Pancreatitis: never Pancreatic imaging: none known SDOH: none Other: she took insulin 2007-2010 Interval history: no cbg record, but states cbg varies from 123-330.  It is in general higher as the day goes on, but not necessarily so.   Past Medical History:  Diagnosis Date   Anemia microcytic 03/31/2013   Bipolar disorder ([place]) 03/31/2013   CHF (congestive heart failure) (HCC)    Diabetes mellitus without complication ([place])    Hyperlipidemia 03/31/2013   Hypertension    Obesity hypoventilation syndrome (HCC)    Plantar fasciitis of left foot    Pneumonia    Pneumothorax    Pulmonary infiltrates    Rhabdomyolysis 03/31/2013   [facility] secondary to abilify   Vitamin D deficiency 03/31/2013    Past Surgical History:  Procedure Laterality Date   CESAREAN SECTION     x2   CYST EXCISION     DILATATION & CURETTAGE/HYSTEROSCOPY WITH MYOSURE N/A 02/17/2016   Procedure: DILATATION & CURETTAGE/HYSTEROSCOPY/Myomectomy WITH MYOSURE;  Surgeon: Aloha Gell, MD;  Location: [facility];  Service: Gynecology;  Laterality: N/A;   IR ANGIOGRAM PELVIS SELECTIVE OR SUPRASELECTIVE  03/29/2017   IR ANGIOGRAM PELVIS SELECTIVE OR SUPRASELECTIVE  03/29/2017   IR ANGIOGRAM SELECTIVE EACH ADDITIONAL VESSEL  03/29/2017   IR ANGIOGRAM SELECTIVE EACH ADDITIONAL VESSEL  03/29/2017   IR EMBO TUMOR ORGAN ISCHEMIA INFARCT INC GUIDE ROADMAPPING  03/29/2017   IR GENERIC HISTORICAL  11/14/2016   IR RADIOLOGIST EVAL & MGMT 11/14/2016 Greggory Keen, MD [facility] INTERV RAD   IR RADIOLOGIST EVAL & MGMT  05/16/2017   IR US GUIDE VASC ACCESS RIGHT  03/29/2017   MYOMECTOMY N/A 02/17/2016   Procedure: Amado Coe WITH Jacklynn Barnacle;  Surgeon: Aloha Gell, MD;  Location:  [facility];  Service: Gynecology;  Laterality: N/A;   VENTRICULOPERITONEAL SHUNT      Social History   Socioeconomic History   Marital status: Married    Spouse name: Not on file   Number of children: 2   Years of education: Not on file   Highest education level: Not on file  Occupational History   Occupation: Postal service    Employer: [employer]  Tobacco Use   Smoking status: Never   Smokeless tobacco: Never  Substance and Sexual Activity   Alcohol use: No   Drug use: No   Sexual activity: Yes    Birth control/protection: None  Other Topics Concern   Not on file  Social History Narrative   Not on file   Social Determinants of Health   Financial Resource Strain: Not on file  Food Insecurity: Not on file  Transportation Needs: Not on file  Physical Activity: Not on file  Stress: Not on file  Social Connections: Not on file  Intimate Partner Violence: Not on file    Current Outpatient Medications on File Prior to Visit  Medication Sig Dispense Refill   albuterol (VENTOLIN HFA) 108 (90 Base) MCG/ACT inhaler Inhale 2 puffs into the lungs every 6 (six) hours as needed for wheezing or shortness of breath. 6.7 g 0   albuterol (VENTOLIN HFA) 108 (90 Base) MCG/ACT inhaler INHALE 2 PUFFS INTO THE LUNGS EVERY SIX HOURS AS NEEDED FOR WHEEZING OR SHORTNESS OF BREATH. 18 g  0   amLODipine (NORVASC) 10 MG tablet Take 10 mg by mouth daily.     Continuous Blood Gluc Sensor (FREESTYLE LIBRE 14 DAY SENSOR) MISC 1 each by Other route as directed.     divalproex (DEPAKOTE ER) 500 MG 24 hr tablet Take 500 mg by mouth at bedtime.     Dulaglutide (TRULICITY) 4.5 NT/7.0YF SOPN Inject 4.5 mg as directed once a week. 6 mL 3   empagliflozin (JARDIANCE) 25 MG TABS tablet Take 1 tablet (25 mg total) by mouth daily before breakfast. 90 tablet 3   irbesartan (AVAPRO) 300 MG tablet Take 300 mg by mouth daily.  3   lamoTRIgine (LAMICTAL) 200 MG tablet Take 100 mg by mouth every evening.      levothyroxine (SYNTHROID, LEVOTHROID) 50 MCG tablet Take 50 mcg by mouth daily before breakfast.     metFORMIN (GLUCOPHAGE-XR) 500 MG 24 hr tablet Take 4 tablets (2,000 mg total) by mouth daily. 360 tablet 3   No current facility-administered medications on file prior to visit.    Allergies  Allergen Reactions   Abilify [Aripiprazole] Other (See Comments)    Reaction:  Hyperglycemia    Family History  Problem Relation Age of Onset   Diabetes Mother    Hyperlipidemia Mother    Hypertension Mother    Lung cancer Father    Hypertension Brother     BP (!) 140/100 (BP Location: Right Arm, Patient Position: Sitting, Cuff Size: Normal)   Pulse 74   Ht 4\' 9"  (1.448 m)   Wt 170 lb 9.6 oz (77.4 kg)   SpO2 94%   BMI 36.92 kg/m    Review of Systems     Objective:   Physical Exam Pulses: dorsalis pedis intact bilat.   MSK: no deformity of the feet CV: no leg edema Skin:  no ulcer on the feet.  normal color and temp on the feet. Neuro: sensation is intact to touch on the feet.     Lab Results  Component Value Date   CREATININE 0.56 02/10/2021   BUN 14 02/10/2021   NA 138 02/10/2021   K 3.9 02/10/2021   CL 98 02/10/2021   CO2 28 02/10/2021   Lab Results  Component Value Date   HGBA1C 8.5 (A) 09/26/2021      Assessment & Plan:  Type 2 DM: uncontrolled.  she refuses insulin.  We discussed risks of refusal.    Patient Instructions  Your blood pressure is high today.  Please see your primary care provider soon, to have it rechecked. check your blood sugar once a day.  vary the time of day when you check, between before the 3 meals, and at bedtime.  also check if you have symptoms of your blood sugar being too high or too low.  please keep a record of the readings and bring it to your next appointment here (or you can bring the meter itself).  You can write it on any piece of paper.  please call us sooner if your blood sugar goes below 70, or if most of your readings are over  200. I have sent a prescription to your pharmacy, to add repaglinide.   Please continue the same other medications.  Please come back for a follow-up appointment in 2 months.

## 2021-11-25 ENCOUNTER — Other Ambulatory Visit: Payer: Self-pay

## 2021-11-25 ENCOUNTER — Ambulatory Visit: Payer: Federal, State, Local not specified - PPO | Admitting: Endocrinology

## 2021-11-25 VITALS — BP 130/80 | HR 89 | Ht <= 58 in | Wt 169.2 lb

## 2021-11-25 DIAGNOSIS — E118 Type 2 diabetes mellitus with unspecified complications: Secondary | ICD-10-CM

## 2021-11-25 LAB — POCT GLYCOSYLATED HEMOGLOBIN (HGB A1C): Hemoglobin A1C: 7.7 % — AB (ref 4.0–5.6)

## 2021-11-25 MED ORDER — REPAGLINIDE 2 MG PO TABS: 2.0000 mg | ORAL_TABLET | Freq: Three times a day (TID) | ORAL | 3 refills | Status: DC

## 2021-11-25 NOTE — Progress Notes (Signed)
Subjective:    Patient ID: [name], female    DOB: [birth year], 50 y.o.   MRN: [id number]  HPI Pt returns for f/u of diabetes mellitus: DM type: 2 Dx'ed: 9678 Complications: none Therapy: Trulicity and 3 oral meds. GDM: 2001 DKA: never Severe hypoglycemia: never Pancreatitis: never Pancreatic imaging: none known SDOH: none Other: she took insulin 2007-2010.   Interval history: I reviewed continuous glucose monitor download, but data are minimal.  pt states she feels well in general.  pt states she feels well in general.  She takes meds as rx'ed. Past Medical History:  Diagnosis Date   Anemia microcytic 03/31/2013   Bipolar disorder ([place]) 03/31/2013   CHF (congestive heart failure) (HCC)    Diabetes mellitus without complication ([place])    Hyperlipidemia 03/31/2013   Hypertension    Obesity hypoventilation syndrome (HCC)    Plantar fasciitis of left foot    Pneumonia    Pneumothorax    Pulmonary infiltrates    Rhabdomyolysis 03/31/2013   [facility] secondary to abilify   Vitamin D deficiency 03/31/2013    Past Surgical History:  Procedure Laterality Date   CESAREAN SECTION     x2   CYST EXCISION     DILATATION & CURETTAGE/HYSTEROSCOPY WITH MYOSURE N/A 02/17/2016   Procedure: DILATATION & CURETTAGE/HYSTEROSCOPY/Myomectomy WITH MYOSURE;  Surgeon: Aloha Gell, MD;  Location: [facility];  Service: Gynecology;  Laterality: N/A;   IR ANGIOGRAM PELVIS SELECTIVE OR SUPRASELECTIVE  03/29/2017   IR ANGIOGRAM PELVIS SELECTIVE OR SUPRASELECTIVE  03/29/2017   IR ANGIOGRAM SELECTIVE EACH ADDITIONAL VESSEL  03/29/2017   IR ANGIOGRAM SELECTIVE EACH ADDITIONAL VESSEL  03/29/2017   IR EMBO TUMOR ORGAN ISCHEMIA INFARCT INC GUIDE ROADMAPPING  03/29/2017   IR GENERIC HISTORICAL  11/14/2016   IR RADIOLOGIST EVAL & MGMT 11/14/2016 Greggory Keen, MD [facility] INTERV RAD   IR RADIOLOGIST EVAL & MGMT  05/16/2017   IR US GUIDE VASC ACCESS RIGHT  03/29/2017   MYOMECTOMY N/A 02/17/2016   Procedure: Amado Coe  WITH Jacklynn Barnacle;  Surgeon: Aloha Gell, MD;  Location: [facility];  Service: Gynecology;  Laterality: N/A;   VENTRICULOPERITONEAL SHUNT      Social History   Socioeconomic History   Marital status: Married    Spouse name: Not on file   Number of children: 2   Years of education: Not on file   Highest education level: Not on file  Occupational History   Occupation: Postal service    Employer: [employer]  Tobacco Use   Smoking status: Never   Smokeless tobacco: Never  Substance and Sexual Activity   Alcohol use: No   Drug use: No   Sexual activity: Yes    Birth control/protection: None  Other Topics Concern   Not on file  Social History Narrative   Not on file   Social Determinants of Health   Financial Resource Strain: Not on file  Food Insecurity: Not on file  Transportation Needs: Not on file  Physical Activity: Not on file  Stress: Not on file  Social Connections: Not on file  Intimate Partner Violence: Not on file    Current Outpatient Medications on File Prior to Visit  Medication Sig Dispense Refill   albuterol (VENTOLIN HFA) 108 (90 Base) MCG/ACT inhaler Inhale 2 puffs into the lungs every 6 (six) hours as needed for wheezing or shortness of breath. 6.7 g 0   albuterol (VENTOLIN HFA) 108 (90 Base) MCG/ACT inhaler INHALE 2 PUFFS INTO THE LUNGS EVERY SIX HOURS AS NEEDED  FOR WHEEZING OR SHORTNESS OF BREATH. 18 g 0   amLODipine (NORVASC) 10 MG tablet Take 10 mg by mouth daily.     Continuous Blood Gluc Sensor (FREESTYLE LIBRE 14 DAY SENSOR) MISC 1 each by Other route as directed.     divalproex (DEPAKOTE ER) 500 MG 24 hr tablet Take 500 mg by mouth at bedtime.     Dulaglutide (TRULICITY) 4.5 GQ/6.7YP SOPN Inject 4.5 mg as directed once a week. 6 mL 3   empagliflozin (JARDIANCE) 25 MG TABS tablet Take 1 tablet (25 mg total) by mouth daily before breakfast. 90 tablet 3   irbesartan (AVAPRO) 300 MG tablet Take 300 mg by mouth daily.  3   lamoTRIgine (LAMICTAL) 200 MG  tablet Take 100 mg by mouth every evening.     levothyroxine (SYNTHROID, LEVOTHROID) 50 MCG tablet Take 50 mcg by mouth daily before breakfast.     metFORMIN (GLUCOPHAGE-XR) 500 MG 24 hr tablet Take 4 tablets (2,000 mg total) by mouth daily. 360 tablet 3   No current facility-administered medications on file prior to visit.    Allergies  Allergen Reactions   Abilify [Aripiprazole] Other (See Comments)    Reaction:  Hyperglycemia    Family History  Problem Relation Age of Onset   Diabetes Mother    Hyperlipidemia Mother    Hypertension Mother    Lung cancer Father    Hypertension Brother     BP 130/80 (BP Location: Right Arm, Patient Position: Sitting, Cuff Size: Normal)   Pulse 89   Ht 4' 8.75" (1.441 m)   Wt 169 lb 3.2 oz (76.7 kg)   SpO2 90%   BMI 36.94 kg/m     Review of Systems She denies hypoglycemia.      Objective:   Physical Exam   A1c=7.7%    Assessment & Plan:  Type 2 DM: uncontrolled  Patient Instructions  Your blood pressure is high today.  Please see your primary care provider soon, to have it rechecked. check your blood sugar once a day.  vary the time of day when you check, between before the 3 meals, and at bedtime.  also check if you have symptoms of your blood sugar being too high or too low.  please keep a record of the readings and bring it to your next appointment here (or you can bring the meter itself).  You can write it on any piece of paper.  please call us sooner if your blood sugar goes below 70, or if most of your readings are over 200. I have sent a prescription to your pharmacy, to increase the repaglinide to 3 times a day (just before each meal) Please continue the same other medications.  The continuous glucose monitor wants you to scan it more often, to get more information.   Please come back for a follow-up appointment in 2 months.

## 2021-11-25 NOTE — Patient Instructions (Addendum)
Your blood pressure is high today.  Please see your primary care provider soon, to have it rechecked. check your blood sugar once a day.  vary the time of day when you check, between before the 3 meals, and at bedtime.  also check if you have symptoms of your blood sugar being too high or too low.  please keep a record of the readings and bring it to your next appointment here (or you can bring the meter itself).  You can write it on any piece of paper.  please call us sooner if your blood sugar goes below 70, or if most of your readings are over 200. I have sent a prescription to your pharmacy, to increase the repaglinide to 3 times a day (just before each meal) Please continue the same other medications.  The continuous glucose monitor wants you to scan it more often, to get more information.   Please come back for a follow-up appointment in 2 months.

## 2022-01-26 ENCOUNTER — Encounter: Payer: Self-pay | Admitting: Endocrinology

## 2022-01-26 ENCOUNTER — Other Ambulatory Visit: Payer: Self-pay

## 2022-01-26 ENCOUNTER — Ambulatory Visit: Payer: Federal, State, Local not specified - PPO | Admitting: Endocrinology

## 2022-01-26 VITALS — BP 136/80 | HR 100 | Ht <= 58 in | Wt 165.8 lb

## 2022-01-26 DIAGNOSIS — E118 Type 2 diabetes mellitus with unspecified complications: Secondary | ICD-10-CM

## 2022-01-26 LAB — POCT GLYCOSYLATED HEMOGLOBIN (HGB A1C): Hemoglobin A1C: 7.8 % — AB (ref 4.0–5.6)

## 2022-01-26 MED ORDER — BROMOCRIPTINE MESYLATE 2.5 MG PO TABS: 1.2500 mg | ORAL_TABLET | Freq: Every day | ORAL | 3 refills | Status: DC

## 2022-01-26 NOTE — Patient Instructions (Addendum)
Your blood pressure is high today.  Please see your primary care provider soon, to have it rechecked. check your blood sugar once a day.  vary the time of day when you check, between before the 3 meals, and at bedtime.  also check if you have symptoms of your blood sugar being too high or too low.  please keep a record of the readings and bring it to your next appointment here (or you can bring the meter itself).  You can write it on any piece of paper.  please call us sooner if your blood sugar goes below 70, or if most of your readings are over 200.  Please add "bromocriptine," to help your blood sugar. It has possible side effects of nausea and dizziness.  These go away with time.  You can avoid these by taking it at bedtime.   Please continue the same other medications.  The continuous glucose monitor wants you to scan it more often, to get more information.   Please come back for a follow-up appointment in 2 months.

## 2022-01-26 NOTE — Progress Notes (Signed)
Subjective:    Patient ID: [name], female    DOB: 12-15-1971, 51 y.o.   MRN: [id number]  HPI Pt returns for f/u of diabetes mellitus:  DM type: 2 Dx'ed: 3846 Complications: none Therapy: Trulicity and 3 oral meds. GDM: 2001 DKA: never Severe hypoglycemia: never Pancreatitis: never Pancreatic imaging: none known SDOH: none Other: she took insulin 2007-2010.   Interval history: I reviewed continuous glucose monitor download, but data are minimal.  pt states she feels well in general.  She takes meds as rx'ed. She has mild hypoglycemia approx once per week.  This happens fasting.   Past Medical History:  Diagnosis Date   Anemia microcytic 03/31/2013   Bipolar disorder ([place]) 03/31/2013   CHF (congestive heart failure) (HCC)    Diabetes mellitus without complication ([place])    Hyperlipidemia 03/31/2013   Hypertension    Obesity hypoventilation syndrome (HCC)    Plantar fasciitis of left foot    Pneumonia    Pneumothorax    Pulmonary infiltrates    Rhabdomyolysis 03/31/2013   [facility] secondary to abilify   Vitamin D deficiency 03/31/2013    Past Surgical History:  Procedure Laterality Date   CESAREAN SECTION     x2   CYST EXCISION     DILATATION & CURETTAGE/HYSTEROSCOPY WITH MYOSURE N/A 02/17/2016   Procedure: DILATATION & CURETTAGE/HYSTEROSCOPY/Myomectomy WITH MYOSURE;  Surgeon: Aloha Gell, MD;  Location: [facility];  Service: Gynecology;  Laterality: N/A;   IR ANGIOGRAM PELVIS SELECTIVE OR SUPRASELECTIVE  03/29/2017   IR ANGIOGRAM PELVIS SELECTIVE OR SUPRASELECTIVE  03/29/2017   IR ANGIOGRAM SELECTIVE EACH ADDITIONAL VESSEL  03/29/2017   IR ANGIOGRAM SELECTIVE EACH ADDITIONAL VESSEL  03/29/2017   IR EMBO TUMOR ORGAN ISCHEMIA INFARCT INC GUIDE ROADMAPPING  03/29/2017   IR GENERIC HISTORICAL  11/14/2016   IR RADIOLOGIST EVAL & MGMT 11/14/2016 Greggory Keen, MD [facility] INTERV RAD   IR RADIOLOGIST EVAL & MGMT  05/16/2017   IR US GUIDE VASC ACCESS RIGHT  03/29/2017   MYOMECTOMY N/A  02/17/2016   Procedure: Amado Coe WITH Jacklynn Barnacle;  Surgeon: Aloha Gell, MD;  Location: [facility];  Service: Gynecology;  Laterality: N/A;   VENTRICULOPERITONEAL SHUNT      Social History   Socioeconomic History   Marital status: Married    Spouse name: Not on file   Number of children: 2   Years of education: Not on file   Highest education level: Not on file  Occupational History   Occupation: Postal service    Employer: [employer]  Tobacco Use   Smoking status: Never   Smokeless tobacco: Never  Substance and Sexual Activity   Alcohol use: No   Drug use: No   Sexual activity: Yes    Birth control/protection: None  Other Topics Concern   Not on file  Social History Narrative   Not on file   Social Determinants of Health   Financial Resource Strain: Not on file  Food Insecurity: Not on file  Transportation Needs: Not on file  Physical Activity: Not on file  Stress: Not on file  Social Connections: Not on file  Intimate Partner Violence: Not on file    Current Outpatient Medications on File Prior to Visit  Medication Sig Dispense Refill   albuterol (VENTOLIN HFA) 108 (90 Base) MCG/ACT inhaler Inhale 2 puffs into the lungs every 6 (six) hours as needed for wheezing or shortness of breath. 6.7 g 0   albuterol (VENTOLIN HFA) 108 (90 Base) MCG/ACT inhaler INHALE 2 PUFFS INTO  THE LUNGS EVERY SIX HOURS AS NEEDED FOR WHEEZING OR SHORTNESS OF BREATH. 18 g 0   amLODipine (NORVASC) 10 MG tablet Take 10 mg by mouth daily.     Continuous Blood Gluc Sensor (FREESTYLE LIBRE 14 DAY SENSOR) MISC 1 each by Other route as directed.     divalproex (DEPAKOTE ER) 500 MG 24 hr tablet Take 500 mg by mouth at bedtime.     Dulaglutide (TRULICITY) 4.5 HY/8.6VH SOPN Inject 4.5 mg as directed once a week. 6 mL 3   empagliflozin (JARDIANCE) 25 MG TABS tablet Take 1 tablet (25 mg total) by mouth daily before breakfast. 90 tablet 3   irbesartan (AVAPRO) 300 MG tablet Take 300 mg by mouth daily.  3    lamoTRIgine (LAMICTAL) 200 MG tablet Take 100 mg by mouth every evening.     levothyroxine (SYNTHROID, LEVOTHROID) 50 MCG tablet Take 50 mcg by mouth daily before breakfast.     metFORMIN (GLUCOPHAGE-XR) 500 MG 24 hr tablet Take 4 tablets (2,000 mg total) by mouth daily. 360 tablet 3   repaglinide (PRANDIN) 2 MG tablet Take 1 tablet (2 mg total) by mouth 3 (three) times daily before meals. 270 tablet 3   No current facility-administered medications on file prior to visit.    Allergies  Allergen Reactions   Abilify [Aripiprazole] Other (See Comments)    Reaction:  Hyperglycemia    Family History  Problem Relation Age of Onset   Diabetes Mother    Hyperlipidemia Mother    Hypertension Mother    Lung cancer Father    Hypertension Brother     BP 136/80    Pulse 100    Ht 4' 8.75" (1.441 m)    Wt 165 lb 12.8 oz (75.2 kg)    SpO2 95%    BMI 36.20 kg/m   Review of Systems Denies N/V/HB/bloating    Objective:   Physical Exam    Lab Results  Component Value Date   HGBA1C 7.8 (A) 01/26/2022      Assessment & Plan:  Type 2 DM: uncontrolled  Patient Instructions  Your blood pressure is high today.  Please see your primary care provider soon, to have it rechecked. check your blood sugar once a day.  vary the time of day when you check, between before the 3 meals, and at bedtime.  also check if you have symptoms of your blood sugar being too high or too low.  please keep a record of the readings and bring it to your next appointment here (or you can bring the meter itself).  You can write it on any piece of paper.  please call us sooner if your blood sugar goes below 70, or if most of your readings are over 200.  Please add "bromocriptine," to help your blood sugar. It has possible side effects of nausea and dizziness.  These go away with time.  You can avoid these by taking it at bedtime.   Please continue the same other medications.  The continuous glucose monitor wants you to  scan it more often, to get more information.   Please come back for a follow-up appointment in 2 months.

## 2022-02-22 ENCOUNTER — Other Ambulatory Visit: Payer: Self-pay | Admitting: Endocrinology

## 2022-02-28 ENCOUNTER — Other Ambulatory Visit (HOSPITAL_COMMUNITY): Payer: Self-pay

## 2022-04-03 ENCOUNTER — Ambulatory Visit: Payer: Medicare Other | Admitting: Endocrinology

## 2022-04-03 ENCOUNTER — Encounter: Payer: Self-pay | Admitting: Endocrinology

## 2022-04-03 VITALS — BP 142/98 | HR 101 | Ht <= 58 in | Wt 164.8 lb

## 2022-04-03 DIAGNOSIS — E118 Type 2 diabetes mellitus with unspecified complications: Secondary | ICD-10-CM | POA: Diagnosis not present

## 2022-04-03 LAB — POCT GLYCOSYLATED HEMOGLOBIN (HGB A1C): Hemoglobin A1C: 7.8 % — AB (ref 4.0–5.6)

## 2022-04-03 MED ORDER — BROMOCRIPTINE MESYLATE 2.5 MG PO TABS: 2.5000 mg | ORAL_TABLET | Freq: Every day | ORAL | 1 refills | Status: DC

## 2022-04-03 NOTE — Patient Instructions (Addendum)
Your blood pressure is high today.  Please see your primary care provider soon, to have it rechecked.   ?check your blood sugar once a day.  vary the time of day when you check, between before the 3 meals, and at bedtime.  also check if you have symptoms of your blood sugar being too high or too low.  please keep a record of the readings and bring it to your next appointment here (or you can bring the meter itself).  You can write it on any piece of paper.  please call us sooner if your blood sugar goes below 70, or if most of your readings are over 200.   ?I have sent a prescription to your pharmacy, to increase the bromocriptine to a whole pill, once per day.   ?Please continue the same other medications.   ?You should have an endocrinology follow-up appointment in 3 months.   ? ?

## 2022-04-03 NOTE — Progress Notes (Signed)
? ?Subjective:  ? ? Patient ID: [name], female    DOB: [birth year], 51 y.o.   MRN: [id number] ? ?HPI ?Pt returns for f/u of diabetes mellitus:  ?DM type: 2 ?Dx'ed: 2007 ?Complications: none ?Therapy: Trulicity and 4 oral meds.  ?GDM: 2001 ?DKA: never ?Severe hypoglycemia: never ?Pancreatitis: never ?Pancreatic imaging: none known ?SDOH: none ?Other: she took insulin 2007-2010.   ?Interval history: I reviewed continuous glucose monitor data.  Glucose varies from 45-200.  It is in general highest at [facility], and less high at [facility].  It is lowest at [facility] and 3PM.  It decreases in the early hrs of the morning.  pt states she feels well in general.  She takes meds as rx'ed. ?She has mild hypoglycemia approx once per week.  This still happens fasting.  ?Past Medical History:  ?Diagnosis Date  ? Anemia microcytic 03/31/2013  ? Bipolar disorder ([place]) 03/31/2013  ? CHF (congestive heart failure) ([place])   ? Diabetes mellitus without complication ([place])   ? Hyperlipidemia 03/31/2013  ? Hypertension   ? Obesity hypoventilation syndrome ([place])   ? Plantar fasciitis of left foot   ? Pneumonia   ? Pneumothorax   ? Pulmonary infiltrates   ? Rhabdomyolysis 03/31/2013  ? [facility] secondary to abilify  ? Vitamin D deficiency 03/31/2013  ? ? ?Past Surgical History:  ?Procedure Laterality Date  ? CESAREAN SECTION    ? x2  ? CYST EXCISION    ? DILATATION & CURETTAGE/HYSTEROSCOPY WITH MYOSURE N/A 02/17/2016  ? Procedure: DILATATION & CURETTAGE/HYSTEROSCOPY/Myomectomy WITH MYOSURE;  Surgeon: Aloha Gell, MD;  Location: [facility];  Service: Gynecology;  Laterality: N/A;  ? IR ANGIOGRAM PELVIS SELECTIVE OR SUPRASELECTIVE  03/29/2017  ? IR ANGIOGRAM PELVIS SELECTIVE OR SUPRASELECTIVE  03/29/2017  ? IR ANGIOGRAM SELECTIVE EACH ADDITIONAL VESSEL  03/29/2017  ? IR ANGIOGRAM SELECTIVE EACH ADDITIONAL VESSEL  03/29/2017  ? IR EMBO TUMOR ORGAN ISCHEMIA INFARCT INC GUIDE ROADMAPPING  03/29/2017  ? IR GENERIC HISTORICAL  11/14/2016  ? IR RADIOLOGIST EVAL & MGMT  11/14/2016 Greggory Keen, MD [facility] INTERV RAD  ? IR RADIOLOGIST EVAL & MGMT  05/16/2017  ? IR US GUIDE VASC ACCESS RIGHT  03/29/2017  ? MYOMECTOMY N/A 02/17/2016  ? Procedure: MYOMECTOMY WITH MYOSURE;  Surgeon: Aloha Gell, MD;  Location: [facility];  Service: Gynecology;  Laterality: N/A;  ? VENTRICULOPERITONEAL SHUNT    ? ? ?Social History  ? ?Socioeconomic History  ? Marital status: Married  ?  Spouse name: Not on file  ? Number of children: 2  ? Years of education: Not on file  ? Highest education level: Not on file  ?Occupational History  ? Occupation: Actor  ?  Employer: [employer]  ?Tobacco Use  ? Smoking status: Never  ? Smokeless tobacco: Never  ?Substance and Sexual Activity  ? Alcohol use: No  ? Drug use: No  ? Sexual activity: Yes  ?  Birth control/protection: None  ?Other Topics Concern  ? Not on file  ?Social History Narrative  ? Not on file  ? ?Social Determinants of Health  ? ?Financial Resource Strain: Not on file  ?Food Insecurity: Not on file  ?Transportation Needs: Not on file  ?Physical Activity: Not on file  ?Stress: Not on file  ?Social Connections: Not on file  ?Intimate Partner Violence: Not on file  ? ? ?Current Outpatient Medications on File Prior to Visit  ?Medication Sig Dispense Refill  ? albuterol (VENTOLIN HFA) 108 (90 Base) MCG/ACT inhaler Inhale 2  puffs into the lungs every 6 (six) hours as needed for wheezing or shortness of breath. 6.7 g 0  ? amLODipine (NORVASC) 10 MG tablet Take 10 mg by mouth daily.    ? Continuous Blood Gluc Sensor (FREESTYLE LIBRE 14 DAY SENSOR) MISC INSERT 1 SENSOR AS DIRECTED 6 each 3  ? divalproex (DEPAKOTE ER) 500 MG 24 hr tablet Take 500 mg by mouth at bedtime.    ? Dulaglutide (TRULICITY) 4.5 RC/7.8LF SOPN Inject 4.5 mg as directed once a week. 6 mL 3  ? empagliflozin (JARDIANCE) 25 MG TABS tablet Take 1 tablet (25 mg total) by mouth daily before breakfast. 90 tablet 3  ? irbesartan (AVAPRO) 300 MG tablet Take 300 mg by mouth daily.  3  ?  lamoTRIgine (LAMICTAL) 200 MG tablet Take 100 mg by mouth every evening.    ? levothyroxine (SYNTHROID, LEVOTHROID) 50 MCG tablet Take 50 mcg by mouth daily before breakfast.    ? metFORMIN (GLUCOPHAGE-XR) 500 MG 24 hr tablet Take 4 tablets (2,000 mg total) by mouth daily. 360 tablet 3  ? repaglinide (PRANDIN) 2 MG tablet Take 1 tablet (2 mg total) by mouth 3 (three) times daily before meals. 270 tablet 3  ? albuterol (VENTOLIN HFA) 108 (90 Base) MCG/ACT inhaler INHALE 2 PUFFS INTO THE LUNGS EVERY SIX HOURS AS NEEDED FOR WHEEZING OR SHORTNESS OF BREATH. 18 g 0  ? ?No current facility-administered medications on file prior to visit.  ? ? ?Allergies  ?Allergen Reactions  ? Abilify [Aripiprazole] Other (See Comments)  ?  Reaction:  Hyperglycemia  ? ? ?Family History  ?Problem Relation Age of Onset  ? Diabetes Mother   ? Hyperlipidemia Mother   ? Hypertension Mother   ? Lung cancer Father   ? Hypertension Brother   ? ? ?BP (!) 142/98 (BP Location: Right Arm, Patient Position: Sitting, Cuff Size: Normal)   Pulse (!) 101   Ht 4' 8.75" (1.441 m)   Wt 164 lb 12.8 oz (74.8 kg)   SpO2 93%   BMI 35.98 kg/m?  ? ? ?Review of Systems ? ?   ?Objective:  ? Physical Exam ? ? ? ?Lab Results  ?Component Value Date  ? HGBA1C 7.8 (A) 04/03/2022  ? ?   ?Assessment & Plan:  ?Type 2 DM: uncontrolled.  ?Hypoglycemia, due to repaglinide: this limits aggressiveness of glycemic control.  ? ?Patient Instructions  ?Your blood pressure is high today.  Please see your primary care provider soon, to have it rechecked.   ?check your blood sugar once a day.  vary the time of day when you check, between before the 3 meals, and at bedtime.  also check if you have symptoms of your blood sugar being too high or too low.  please keep a record of the readings and bring it to your next appointment here (or you can bring the meter itself).  You can write it on any piece of paper.  please call us sooner if your blood sugar goes below 70, or if most of  your readings are over 200.   ?I have sent a prescription to your pharmacy, to increase the bromocriptine to a whole pill, once per day.   ?Please continue the same other medications.   ?You should have an endocrinology follow-up appointment in 3 months.   ? ? ? ?

## 2022-07-25 ENCOUNTER — Ambulatory Visit: Payer: Medicare Other | Admitting: Internal Medicine

## 2022-09-08 ENCOUNTER — Encounter: Payer: Self-pay | Admitting: Internal Medicine

## 2022-09-08 ENCOUNTER — Ambulatory Visit (INDEPENDENT_AMBULATORY_CARE_PROVIDER_SITE_OTHER): Payer: Medicare Other | Admitting: Internal Medicine

## 2022-09-08 VITALS — BP 118/72 | HR 90 | Ht <= 58 in | Wt 164.0 lb

## 2022-09-08 DIAGNOSIS — E785 Hyperlipidemia, unspecified: Secondary | ICD-10-CM

## 2022-09-08 DIAGNOSIS — E118 Type 2 diabetes mellitus with unspecified complications: Secondary | ICD-10-CM | POA: Diagnosis not present

## 2022-09-08 LAB — POCT GLYCOSYLATED HEMOGLOBIN (HGB A1C): Hemoglobin A1C: 7.1 % — AB (ref 4.0–5.6)

## 2022-09-08 MED ORDER — REPAGLINIDE 1 MG PO TABS
1.0000 mg | ORAL_TABLET | Freq: Three times a day (TID) | ORAL | 3 refills | Status: DC
Start: 2022-09-08 — End: 2022-10-20

## 2022-09-08 MED ORDER — TRULICITY 4.5 MG/0.5ML ~~LOC~~ SOAJ
4.5000 mg | SUBCUTANEOUS | 3 refills | Status: DC
Start: 2022-09-08 — End: 2023-01-30

## 2022-09-08 MED ORDER — EMPAGLIFLOZIN 25 MG PO TABS: 25.0000 mg | ORAL_TABLET | Freq: Every day | ORAL | 3 refills | Status: DC

## 2022-09-08 MED ORDER — METFORMIN HCL ER 500 MG PO TB24: 2000.0000 mg | ORAL_TABLET | Freq: Every day | ORAL | 3 refills | Status: DC

## 2022-09-08 NOTE — Patient Instructions (Addendum)
STOP Parlodel ( Bromocriptine ) Increase Metformin 500 mg XR, to TWO tablets with Breakfast and TWO tablets with Supper Change Repaglinide 1 mg, 1 tablet before each meal  Continue Trulicity 4.5 mg weekly  Continue Jardiance 25 mg daily   HOW TO TREAT LOW BLOOD SUGARS (Blood sugar LESS THAN 70 MG/DL) Please follow the RULE OF 15 for the treatment of hypoglycemia treatment (when your (blood sugars are less than 70 mg/dL)   STEP 1: Take 15 grams of carbohydrates when your blood sugar is low, which includes:  3-4 GLUCOSE TABS  OR 3-4 OZ OF JUICE OR REGULAR SODA OR ONE TUBE OF GLUCOSE GEL    STEP 2: RECHECK blood sugar in 15 MINUTES STEP 3: If your blood sugar is still low at the 15 minute recheck --> then, go back to STEP 1 and treat AGAIN with another 15 grams of carbohydrates.

## 2022-09-08 NOTE — Progress Notes (Unsigned)
Name: [name]  Age/ Sex: 51 y.o., female   MRN/ DOB: [id number], 1971/04/13     PCP: Glendon Axe, MD   Reason for Endocrinology Evaluation: Type 2 Diabetes Mellitus  Initial Endocrine Consultative Visit: 06/24/2021    PATIENT IDENTIFIER: [name] is a 52 y.o. female with a past medical history of T2DM, dyslipidemia, bipolar d/o, CHF, ventriculoperitoneal shunt . The patient has followed with Endocrinology clinic since 06/24/2021 for consultative assistance with management of her diabetes.  DIABETIC HISTORY:  [name] was diagnosed with DM 2007, she was on insulin between 2007 and 2010 . Her hemoglobin A1c has ranged from 6.1% in 2016, peaking at 12.4% in 2022.   She was seen by Dr. Loanne Drilling from 06/2021 until 03/2022  SUBJECTIVE:   During the last visit (04/03/2022): saw Dr. Loanne Drilling   Today (09/08/2022): [name]  She checks her blood sugars multiple  times daily. The patient has  had hypoglycemic episodes since the last clinic visit, which typically occur during the day   Denies nausea, vomiting or diarrhea   Used to be on statin but she didn't  like it when she read about it and was switched on [place]:  Bromocriptine 2.5 mg daily  Trulicity 4.5 mg weekly  Jardiance 25 mg daily  Repaglinidie 2 mg TIDQAC Metformin 500 mg 4 tabs daily - 1 tablet BID      Statin: no ACE-I/ARB: yes Prior Diabetic Education: yes   CONTINUOUS GLUCOSE MONITORING RECORD INTERPRETATION    Dates of Recording: 9/9-9/22/2023  Sensor description:freestyle libre   Results statistics:   CGM use % of time 29  Average and SD 96/25.2  Time in range      85  %  % Time Above 180 1  % Time above 250 0  % Time Below target 12   Glycemic patterns summary: Bg's within goal during the day and night   Hyperglycemic episodes  n/a  Hypoglycemic episodes occurred during the day after meals   Overnight periods: stable     DIABETIC COMPLICATIONS: Microvascular  complications:   Denies: neuropathy, retinopathy ,  Last Eye Exam: Completed   Macrovascular complications:   Denies: CAD, CVA, PVD   HISTORY:  Past Medical History:  Past Medical History:  Diagnosis Date   Anemia microcytic 03/31/2013   Bipolar disorder ([place]) 03/31/2013   CHF (congestive heart failure) ([place])    Diabetes mellitus without complication ([place])    Hyperlipidemia 03/31/2013   Hypertension    Obesity hypoventilation syndrome ([facility])    Plantar fasciitis of left foot    Pneumonia    Pneumothorax    Pulmonary infiltrates    Rhabdomyolysis 03/31/2013   [facility] secondary to abilify   Vitamin D deficiency 03/31/2013   Past Surgical History:  Past Surgical History:  Procedure Laterality Date   CESAREAN SECTION     x2   CYST EXCISION     DILATATION & CURETTAGE/HYSTEROSCOPY WITH MYOSURE N/A 02/17/2016   Procedure: DILATATION & CURETTAGE/HYSTEROSCOPY/Myomectomy WITH MYOSURE;  Surgeon: Aloha Gell, MD;  Location: [facility];  Service: Gynecology;  Laterality: N/A;   IR ANGIOGRAM PELVIS SELECTIVE OR SUPRASELECTIVE  03/29/2017   IR ANGIOGRAM PELVIS SELECTIVE OR SUPRASELECTIVE  03/29/2017   IR ANGIOGRAM SELECTIVE EACH ADDITIONAL VESSEL  03/29/2017   IR ANGIOGRAM SELECTIVE EACH ADDITIONAL VESSEL  03/29/2017   IR EMBO TUMOR ORGAN ISCHEMIA INFARCT INC GUIDE ROADMAPPING  03/29/2017   IR GENERIC HISTORICAL  11/14/2016   IR RADIOLOGIST EVAL & MGMT  11/14/2016 Greggory Keen, MD [facility] INTERV RAD   IR RADIOLOGIST EVAL & MGMT  05/16/2017   IR US GUIDE VASC ACCESS RIGHT  03/29/2017   MYOMECTOMY N/A 02/17/2016   Procedure: Amado Coe WITH Jacklynn Barnacle;  Surgeon: Aloha Gell, MD;  Location: [facility];  Service: Gynecology;  Laterality: N/A;   VENTRICULOPERITONEAL SHUNT     Social History:  reports that she has never smoked. She has never used smokeless tobacco. She reports that she does not drink alcohol and does not use drugs. Family History:  Family History  Problem Relation Age of Onset   Diabetes  Mother    Hyperlipidemia Mother    Hypertension Mother    Lung cancer Father    Hypertension Brother      HOME MEDICATIONS: Allergies as of 09/08/2022       Reactions   Abilify [aripiprazole] Other (See Comments)   Reaction:  Hyperglycemia        Medication List        Accurate as of September 08, 2022  1:06 PM. If you have any questions, ask your nurse or doctor.          albuterol 108 (90 Base) MCG/ACT inhaler Commonly known as: VENTOLIN HFA Inhale 2 puffs into the lungs every 6 (six) hours as needed for wheezing or shortness of breath.   albuterol 108 (90 Base) MCG/ACT inhaler Commonly known as: VENTOLIN HFA INHALE 2 PUFFS INTO THE LUNGS EVERY SIX HOURS AS NEEDED FOR WHEEZING OR SHORTNESS OF BREATH.   amLODipine 10 MG tablet Commonly known as: NORVASC Take 10 mg by mouth daily.   bromocriptine 2.5 MG tablet Commonly known as: PARLODEL Take 1 tablet (2.5 mg total) by mouth at bedtime.   divalproex 500 MG 24 hr tablet Commonly known as: DEPAKOTE ER Take 500 mg by mouth at bedtime.   empagliflozin 25 MG Tabs tablet Commonly known as: Jardiance Take 1 tablet (25 mg total) by mouth daily before breakfast.   FreeStyle Libre 14 Day Sensor Misc INSERT 1 SENSOR AS DIRECTED   irbesartan 300 MG tablet Commonly known as: AVAPRO Take 300 mg by mouth daily.   lamoTRIgine 200 MG tablet Commonly known as: LAMICTAL Take 100 mg by mouth every evening.   levothyroxine 50 MCG tablet Commonly known as: SYNTHROID Take 50 mcg by mouth daily before breakfast.   metFORMIN 500 MG 24 hr tablet Commonly known as: GLUCOPHAGE-XR Take 4 tablets (2,000 mg total) by mouth daily.   repaglinide 2 MG tablet Commonly known as: PRANDIN Take 1 tablet (2 mg total) by mouth 3 (three) times daily before meals.   Trulicity 4.5 QP/6.1PJ Sopn Generic drug: Dulaglutide Inject 4.5 mg as directed once a week.         OBJECTIVE:   Vital Signs: BP 118/72 (BP Location: Left Arm,  Patient Position: Sitting, Cuff Size: Small)   Pulse 90   Ht 4' 8.75" (1.441 m)   Wt 164 lb (74.4 kg)   SpO2 94%   BMI 35.80 kg/m      Exam: General: Pt appears well and is in NAD Right neck shunt palpated   Neck: General: Supple without adenopathy. Thyroid: Thyroid size normal.  No goiter or nodules appreciated.   Lungs: Clear with good BS bilat   Heart: RRR   Abdomen:  soft, nontender  Extremities: No pretibial edema.   Neuro: MS is good with appropriate affect, pt is alert and Ox3      DATA REVIEWED:  Lab Results  Component Value  Date   HGBA1C 7.8 (A) 04/03/2022   HGBA1C 7.8 (A) 01/26/2022   HGBA1C 7.7 (A) 11/25/2021   Lab Results  Component Value Date   LDLCALC 124 (H) 02/22/2016   CREATININE 0.56 02/10/2021   No results found for: "MICRALBCREAT"   Lab Results  Component Value Date   CHOL 188 02/22/2016   HDL 46 02/22/2016   LDLCALC 124 (H) 02/22/2016   TRIG 89 02/22/2016   CHOLHDL 4.1 02/22/2016       Old records , labs and images have been reviewed.    ASSESSMENT / PLAN / RECOMMENDATIONS:   1) Type 2 Diabetes Mellitus, Optimally controlled, Without complications - Most recent A1c of 7.1 %. Goal A1c < 7.0 %.     - In reviewing CGM download, the pt has been noted with recurrent hypoglycemia postprandial, I will reduce repaglinide as below  - She also has been taking less Metformin than previously prescribed , will increase      MEDICATIONS: Stop Bromocriptine Increase Metformin 500 mg XR 2 tabs BID  Decrease Repaglinide to 1 mg TIDQAC Continue Trulicity 4.5 mg weekly  Continue Jardiance 25 mg daily   EDUCATION / INSTRUCTIONS: BG monitoring instructions: Patient is instructed to check her blood sugars 3 times a day, before meals . Call [place] [facility] if: BG persistently < 70  I reviewed the Rule of 15 for the treatment of hypoglycemia in detail with the patient. Literature supplied.    2) Diabetic complications:  Eye: Does  not have known diabetic retinopathy.  Neuro/ Feet: Does not have known diabetic peripheral neuropathy .  Renal: Patient does not have known baseline CKD. She   is  on an ACEI/ARB at present.    3) Dyslipidemia:   - Historically LDL above goal  - She was on a statin but she was skeptical about side effects and currently on Zetia, we discussed the cardiovascular benefits of statin  therapy and encouraged her to rethink    F/U in 4 months     Signed electronically by: Mack Guise, MD  [facility]  [address] [place], [place] [address] Phone: [phone] FAX: [phone]   CC: Glendon Axe, [place] [facility] [place] [address] Phone: [phone]  Fax: [phone]  Return to Endocrinology clinic as below: Future Appointments  Date Time Provider [place]  09/08/2022  2:40 PM Nakeshia Waldeck, Melanie Crazier, MD [facility] None

## 2022-09-11 ENCOUNTER — Encounter: Payer: Self-pay | Admitting: Internal Medicine

## 2022-10-20 ENCOUNTER — Other Ambulatory Visit: Payer: Self-pay

## 2022-10-20 DIAGNOSIS — E118 Type 2 diabetes mellitus with unspecified complications: Secondary | ICD-10-CM

## 2022-10-20 MED ORDER — REPAGLINIDE 1 MG PO TABS: 1.0000 mg | ORAL_TABLET | Freq: Three times a day (TID) | ORAL | 3 refills | Status: DC

## 2023-01-24 ENCOUNTER — Ambulatory Visit: Payer: Medicare Other | Admitting: Internal Medicine

## 2023-01-30 ENCOUNTER — Ambulatory Visit (INDEPENDENT_AMBULATORY_CARE_PROVIDER_SITE_OTHER): Payer: Medicare Other | Admitting: Internal Medicine

## 2023-01-30 ENCOUNTER — Encounter: Payer: Self-pay | Admitting: Internal Medicine

## 2023-01-30 VITALS — BP 120/72 | HR 84 | Ht <= 58 in | Wt 166.0 lb

## 2023-01-30 DIAGNOSIS — E118 Type 2 diabetes mellitus with unspecified complications: Secondary | ICD-10-CM

## 2023-01-30 LAB — POCT GLYCOSYLATED HEMOGLOBIN (HGB A1C): Hemoglobin A1C: 6.9 % — AB (ref 4.0–5.6)

## 2023-01-30 MED ORDER — REPAGLINIDE 0.5 MG PO TABS: 0.5000 mg | ORAL_TABLET | Freq: Three times a day (TID) | ORAL | 2 refills | Status: DC

## 2023-01-30 MED ORDER — EMPAGLIFLOZIN 25 MG PO TABS: 25.0000 mg | ORAL_TABLET | Freq: Every day | ORAL | 3 refills | Status: DC

## 2023-01-30 MED ORDER — TRULICITY 4.5 MG/0.5ML ~~LOC~~ SOAJ: 4.5000 mg | SUBCUTANEOUS | 3 refills | Status: DC

## 2023-01-30 MED ORDER — FREESTYLE LIBRE 3 SENSOR MISC: 1.0000 | 3 refills | Status: DC

## 2023-01-30 MED ORDER — METFORMIN HCL ER 500 MG PO TB24: 2000.0000 mg | ORAL_TABLET | Freq: Every day | ORAL | 3 refills | Status: DC

## 2023-01-30 NOTE — Patient Instructions (Signed)
Continue  Metformin 500 mg XR, to TWO tablets with Breakfast and TWO tablets with Supper Change Repaglinide 0.5 mg, 1 tablet before each meal  Continue Trulicity 4.5 mg weekly  Continue Jardiance 25 mg daily   HOW TO TREAT LOW BLOOD SUGARS (Blood sugar LESS THAN 70 MG/DL) Please follow the RULE OF 15 for the treatment of hypoglycemia treatment (when your (blood sugars are less than 70 mg/dL)   STEP 1: Take 15 grams of carbohydrates when your blood sugar is low, which includes:  3-4 GLUCOSE TABS  OR 3-4 OZ OF JUICE OR REGULAR SODA OR ONE TUBE OF GLUCOSE GEL    STEP 2: RECHECK blood sugar in 15 MINUTES STEP 3: If your blood sugar is still low at the 15 minute recheck --> then, go back to STEP 1 and treat AGAIN with another 15 grams of carbohydrates.

## 2023-01-30 NOTE — Progress Notes (Signed)
Name: [name]  Age/ Sex: 52 y.o., female   MRN/ DOB: [id number], 1971-10-01     PCP: Glendon Axe, MD   Reason for Endocrinology Evaluation: Type 2 Diabetes Mellitus  Initial Endocrine Consultative Visit: 06/24/2021    PATIENT IDENTIFIER: [name] is a 52 y.o. female with a past medical history of T2DM, dyslipidemia, bipolar d/o, CHF, ventriculoperitoneal shunt . The patient has followed with Endocrinology clinic since 06/24/2021 for consultative assistance with management of her diabetes.  DIABETIC HISTORY:  [name] was diagnosed with DM 2007, she was on insulin between 2007 and 2010 . Her hemoglobin A1c has ranged from 6.1% in 2016, peaking at 12.4% in 2022.   She was seen by Dr. Loanne Drilling from 06/2021 until 03/2022    On her initial visit with me, she had an A1c of 7.1%, she was on bromocriptine, metformin, repaglinide, Trulicity, and Jardiance.  We stopped bromocriptine, increase metformin and decrease repaglinide, continue Trulicity and Jardiance  SUBJECTIVE:   During the last visit (09/08/2022): A1c 7.1%      Today (01/30/2023): [name]  She checks her blood sugars multiple  times daily. The patient has  had hypoglycemic episodes since the last clinic visit, which typically occur during the day .She is not symptomatic with these episode  Denies nausea, vomiting or diarrhea   Used to be on statin but she didn't  like it when she read about it and was switched on [place]:   Trulicity 4.5 mg weekly  Jardiance 25 mg daily  Repaglinide 1 mg TIDQAC Metformin 500 mg 2 tabs BID     Statin: no ACE-I/ARB: yes Prior Diabetic Education: yes   CONTINUOUS GLUCOSE MONITORING RECORD INTERPRETATION    Dates of Recording: 1/31-2/13/2024  Sensor description:freestyle libre   Results statistics:   CGM use % of time 22  Average and SD 102/28.8  Time in range   85 %  % Time Above 180 2  % Time above 250 0  % Time Below target 12    Glycemic patterns summary: Limited data with no night time data , during the day she have variable readings between hyperglycemia and hypoglycemia   Hyperglycemic episodes  postprandial   Hypoglycemic episodes occurred following meals  Overnight periods: unknown      DIABETIC COMPLICATIONS: Microvascular complications:   Denies: neuropathy, retinopathy ,  Last Eye Exam: Completed 05/2022  Macrovascular complications:   Denies: CAD, CVA, PVD   HISTORY:  Past Medical History:  Past Medical History:  Diagnosis Date   Anemia microcytic 03/31/2013   Bipolar disorder ([place]) 03/31/2013   CHF (congestive heart failure) ([place])    Diabetes mellitus without complication ([place])    Hyperlipidemia 03/31/2013   Hypertension    Obesity hypoventilation syndrome (HCC)    Plantar fasciitis of left foot    Pneumonia    Pneumothorax    Pulmonary infiltrates    Rhabdomyolysis 03/31/2013   [facility] secondary to abilify   Vitamin D deficiency 03/31/2013   Past Surgical History:  Past Surgical History:  Procedure Laterality Date   CESAREAN SECTION     x2   CYST EXCISION     DILATATION & CURETTAGE/HYSTEROSCOPY WITH MYOSURE N/A 02/17/2016   Procedure: DILATATION & CURETTAGE/HYSTEROSCOPY/Myomectomy WITH MYOSURE;  Surgeon: Aloha Gell, MD;  Location: [facility];  Service: Gynecology;  Laterality: N/A;   IR ANGIOGRAM PELVIS SELECTIVE OR SUPRASELECTIVE  03/29/2017   IR ANGIOGRAM PELVIS SELECTIVE OR SUPRASELECTIVE  03/29/2017   IR ANGIOGRAM SELECTIVE EACH  ADDITIONAL VESSEL  03/29/2017   IR ANGIOGRAM SELECTIVE EACH ADDITIONAL VESSEL  03/29/2017   IR EMBO TUMOR ORGAN ISCHEMIA INFARCT INC GUIDE ROADMAPPING  03/29/2017   IR GENERIC HISTORICAL  11/14/2016   IR RADIOLOGIST EVAL & MGMT 11/14/2016 Stacy Keen, MD [facility] INTERV RAD   IR RADIOLOGIST EVAL & MGMT  05/16/2017   IR US GUIDE VASC ACCESS RIGHT  03/29/2017   MYOMECTOMY N/A 02/17/2016   Procedure: Amado Coe WITH Jacklynn Barnacle;  Surgeon: Aloha Gell, MD;   Location: [facility];  Service: Gynecology;  Laterality: N/A;   VENTRICULOPERITONEAL SHUNT     Social History:  reports that she has never smoked. She has never used smokeless tobacco. She reports that she does not drink alcohol and does not use drugs. Family History:  Family History  Problem Relation Age of Onset   Diabetes Mother    Hyperlipidemia Mother    Hypertension Mother    Lung cancer Father    Hypertension Brother      HOME MEDICATIONS: Allergies as of 01/30/2023       Reactions   Abilify [aripiprazole] Other (See Comments)   Reaction:  Hyperglycemia        Medication List        Accurate as of January 30, 2023 10:37 AM. If you have any questions, ask your nurse or doctor.          albuterol 108 (90 Base) MCG/ACT inhaler Commonly known as: VENTOLIN HFA Inhale 2 puffs into the lungs every 6 (six) hours as needed for wheezing or shortness of breath.   albuterol 108 (90 Base) MCG/ACT inhaler Commonly known as: VENTOLIN HFA INHALE 2 PUFFS INTO THE LUNGS EVERY SIX HOURS AS NEEDED FOR WHEEZING OR SHORTNESS OF BREATH.   amLODipine 10 MG tablet Commonly known as: NORVASC Take 10 mg by mouth daily.   divalproex 500 MG 24 hr tablet Commonly known as: DEPAKOTE ER Take 500 mg by mouth at bedtime.   ezetimibe 10 MG tablet Commonly known as: ZETIA Take 10 mg by mouth daily.   FreeStyle Libre 14 Day Sensor Misc INSERT 1 SENSOR AS DIRECTED   irbesartan 300 MG tablet Commonly known as: AVAPRO Take 300 mg by mouth daily.   Jardiance 10 MG Tabs tablet Generic drug: empagliflozin Take 10 mg by mouth daily. What changed: Another medication with the same name was removed. Continue taking this medication, and follow the directions you see here. Changed by: Dorita Sciara, MD   lamoTRIgine 200 MG tablet Commonly known as: LAMICTAL Take 100 mg by mouth every evening.   levothyroxine 50 MCG tablet Commonly known as: SYNTHROID Take 50 mcg by mouth daily  before breakfast.   metFORMIN 500 MG 24 hr tablet Commonly known as: GLUCOPHAGE-XR Take 4 tablets (2,000 mg total) by mouth daily.   repaglinide 1 MG tablet Commonly known as: PRANDIN Take 1 tablet (1 mg total) by mouth 3 (three) times daily before meals.   Trulicity 4.5 0000000 Sopn Generic drug: Dulaglutide Inject 4.5 mg as directed once a week.         OBJECTIVE:   Vital Signs: BP 120/72 (BP Location: Left Arm, Patient Position: Sitting, Cuff Size: Large)   Pulse 84   Ht 4' 8.75" (1.441 m)   Wt 166 lb (75.3 kg)   SpO2 96%   BMI 36.24 kg/m      Exam: General: Pt appears well and is in NAD Right neck shunt palpated   Lungs: Clear with good BS bilat  Heart: RRR   Extremities: Trace pretibial edema.   Neuro: MS is good with appropriate affect, pt is alert and Ox3   DM Foot Exam 01/30/2023  The skin of the feet is intact without sores or ulcerations. The pedal pulses are 2+ on right and 2+ on left. The sensation is intact to a screening 5.07, 10 gram monofilament bilaterally   DATA REVIEWED:  Lab Results  Component Value Date   HGBA1C 6.9 (A) 01/30/2023   HGBA1C 7.1 (A) 09/08/2022   HGBA1C 7.8 (A) 04/03/2022   Lab Results  Component Value Date   LDLCALC 124 (H) 02/22/2016   CREATININE 0.56 02/10/2021   No results found for: "MICRALBCREAT"   Lab Results  Component Value Date   CHOL 188 02/22/2016   HDL 46 02/22/2016   LDLCALC 124 (H) 02/22/2016   TRIG 89 02/22/2016   CHOLHDL 4.1 02/22/2016         ASSESSMENT / PLAN / RECOMMENDATIONS:   1) Type 2 Diabetes Mellitus, Optimally controlled, Without complications - Most recent A1c of 6.9 %. Goal A1c < 7.0 %.     - In reviewing CGM download, the pt has been noted with recurrent hypoglycemia postprandial, I am not clear about the accuracy of these readings, as she does not seem to have symptoms. I have asked her to verify with a finger stick especially when she has a new sensor on . In the meantime  I will reduce repaglinide as below  - She also has been taking less Metformin than previously prescribed , will increase   - I will switch freestyle libre 14 to [place] 3 , a sample provided today  - She will contact us with glucose meter sample at home to send strips     MEDICATIONS: Continue  Metformin 500 mg XR 2 tabs BID  Decrease Repaglinide to 0.5 mg TIDQAC Continue Trulicity 4.5 mg weekly  Continue Jardiance 25 mg daily   EDUCATION / INSTRUCTIONS: BG monitoring instructions: Patient is instructed to check her blood sugars 3 times a day, before meals . Call [place] [facility] clinic if: BG persistently < 70  I reviewed the Rule of 15 for the treatment of hypoglycemia in detail with the patient. Literature supplied.    2) Diabetic complications:  Eye: Does not have known diabetic retinopathy.  Neuro/ Feet: Does not have known diabetic peripheral neuropathy .  Renal: Patient does not have known baseline CKD. She   is  on an ACEI/ARB at present.    3) Dyslipidemia:   - Historically LDL above goal  - She was on a statin but she was skeptical about side effects and currently on Zetia, we discussed the cardiovascular benefits of statin  therapy and encouraged her to rethink    F/U in 6 months     Signed electronically by: Mack Guise, MD  [facility]  [facility] [address] [place], [place] [address] Phone: [phone] FAX: [phone]   CC: Glendon Axe, [place] [facility] [place] [address] Phone: [phone]  Fax: [phone]  Return to Endocrinology clinic as below: No future appointments.

## 2023-01-31 ENCOUNTER — Encounter: Payer: Self-pay | Admitting: Internal Medicine

## 2023-02-27 ENCOUNTER — Other Ambulatory Visit: Payer: Self-pay

## 2023-02-27 MED ORDER — ONETOUCH DELICA LANCETS 33G MISC: 11 refills | Status: AC

## 2023-02-27 MED ORDER — ONETOUCH VERIO VI STRP: ORAL_STRIP | 12 refills | Status: AC

## 2023-02-27 MED ORDER — ONETOUCH VERIO W/DEVICE KIT: PACK | 0 refills | Status: DC

## 2023-03-06 ENCOUNTER — Other Ambulatory Visit: Payer: Self-pay

## 2023-03-06 MED ORDER — ONETOUCH VERIO W/DEVICE KIT: PACK | 0 refills | Status: AC

## 2023-07-31 ENCOUNTER — Ambulatory Visit: Payer: Medicare Other | Admitting: Internal Medicine

## 2023-07-31 NOTE — Progress Notes (Deleted)
Name: [name]  Age/ Sex: 52 y.o., female   MRN/ DOB: [id number], July 17, 1971     PCP: Caffie Damme, MD   Reason for Endocrinology Evaluation: Type 2 Diabetes Mellitus  Initial Endocrine Consultative Visit: 06/24/2021    PATIENT IDENTIFIER: [name] is a 52 y.o. female with a past medical history of T2DM, dyslipidemia, bipolar d/o, CHF, ventriculoperitoneal shunt . The patient has followed with Endocrinology clinic since 06/24/2021 for consultative assistance with management of her diabetes.  DIABETIC HISTORY:  [name] was diagnosed with DM 2007, she was on insulin between 2007 and 2010 . Her hemoglobin A1c has ranged from 6.1% in 2016, peaking at 12.4% in 2022.   She was seen by Dr. Everardo All from 06/2021 until 03/2022    On her initial visit with me, she had an A1c of 7.1%, she was on bromocriptine, metformin, repaglinide, Trulicity, and Jardiance.  We stopped bromocriptine, increase metformin and decrease repaglinide, continue Trulicity and Jardiance  SUBJECTIVE:   During the last visit (01/30/2023): A1c 6.9%      Today (07/31/2023): [name]  She checks her blood sugars multiple  times daily. The patient has  had hypoglycemic episodes since the last clinic visit, which typically occur during the day .She is not symptomatic with these episode  Denies nausea, vomiting or diarrhea   Used to be on statin but she didn't  like it when she read about it and was switched on Zetia  HOME DIABETES REGIMEN:   Trulicity 4.5 mg weekly  Jardiance 25 mg daily  Repaglinide 0.5 mg TIDQAC Metformin 500 mg 2 tabs BID     Statin: no ACE-I/ARB: yes Prior Diabetic Education: yes   CONTINUOUS GLUCOSE MONITORING RECORD INTERPRETATION    Dates of Recording: 1/31-2/13/2024  Sensor description:freestyle libre   Results statistics:   CGM use % of time 22  Average and SD 102/28.8  Time in range   85 %  % Time Above 180 2  % Time above 250 0  % Time Below target 12    Glycemic patterns summary: Limited data with no night time data , during the day she have variable readings between hyperglycemia and hypoglycemia   Hyperglycemic episodes  postprandial   Hypoglycemic episodes occurred following meals  Overnight periods: unknown      DIABETIC COMPLICATIONS: Microvascular complications:   Denies: neuropathy, retinopathy ,  Last Eye Exam: Completed 05/2022  Macrovascular complications:   Denies: CAD, CVA, PVD   HISTORY:  Past Medical History:  Past Medical History:  Diagnosis Date   Anemia microcytic 03/31/2013   Bipolar disorder (HCC) 03/31/2013   CHF (congestive heart failure) (HCC)    Diabetes mellitus without complication (HCC)    Hyperlipidemia 03/31/2013   Hypertension    Obesity hypoventilation syndrome (HCC)    Plantar fasciitis of left foot    Pneumonia    Pneumothorax    Pulmonary infiltrates    Rhabdomyolysis 03/31/2013   [facility] secondary to abilify   Vitamin D deficiency 03/31/2013   Past Surgical History:  Past Surgical History:  Procedure Laterality Date   CESAREAN SECTION     x2   CYST EXCISION     DILATATION & CURETTAGE/HYSTEROSCOPY WITH MYOSURE N/A 02/17/2016   Procedure: DILATATION & CURETTAGE/HYSTEROSCOPY/Myomectomy WITH MYOSURE;  Surgeon: Noland Fordyce, MD;  Location: [facility];  Service: Gynecology;  Laterality: N/A;   IR ANGIOGRAM PELVIS SELECTIVE OR SUPRASELECTIVE  03/29/2017   IR ANGIOGRAM PELVIS SELECTIVE OR SUPRASELECTIVE  03/29/2017   IR ANGIOGRAM SELECTIVE EACH  ADDITIONAL VESSEL  03/29/2017   IR ANGIOGRAM SELECTIVE EACH ADDITIONAL VESSEL  03/29/2017   IR EMBO TUMOR ORGAN ISCHEMIA INFARCT INC GUIDE ROADMAPPING  03/29/2017   IR GENERIC HISTORICAL  11/14/2016   IR RADIOLOGIST EVAL & MGMT 11/14/2016 Berdine Dance, MD [facility] INTERV RAD   IR RADIOLOGIST EVAL & MGMT  05/16/2017   IR US GUIDE VASC ACCESS RIGHT  03/29/2017   MYOMECTOMY N/A 02/17/2016   Procedure: Claria Dice WITH Jackquline Denmark;  Surgeon: Noland Fordyce, MD;   Location: [facility];  Service: Gynecology;  Laterality: N/A;   VENTRICULOPERITONEAL SHUNT     Social History:  reports that she has never smoked. She has never used smokeless tobacco. She reports that she does not drink alcohol and does not use drugs. Family History:  Family History  Problem Relation Age of Onset   Diabetes Mother    Hyperlipidemia Mother    Hypertension Mother    Lung cancer Father    Hypertension Brother      HOME MEDICATIONS: Allergies as of 07/31/2023       Reactions   Abilify [aripiprazole] Other (See Comments)   Reaction:  Hyperglycemia        Medication List        Accurate as of July 31, 2023  6:58 AM. If you have any questions, ask your nurse or doctor.          albuterol 108 (90 Base) MCG/ACT inhaler Commonly known as: VENTOLIN HFA Inhale 2 puffs into the lungs every 6 (six) hours as needed for wheezing or shortness of breath.   albuterol 108 (90 Base) MCG/ACT inhaler Commonly known as: VENTOLIN HFA INHALE 2 PUFFS INTO THE LUNGS EVERY SIX HOURS AS NEEDED FOR WHEEZING OR SHORTNESS OF BREATH.   amLODipine 10 MG tablet Commonly known as: NORVASC Take 10 mg by mouth daily.   divalproex 500 MG 24 hr tablet Commonly known as: DEPAKOTE ER Take 500 mg by mouth at bedtime.   empagliflozin 25 MG Tabs tablet Commonly known as: Jardiance Take 1 tablet (25 mg total) by mouth daily.   ezetimibe 10 MG tablet Commonly known as: ZETIA Take 10 mg by mouth daily.   FreeStyle Libre 3 Sensor Misc 1 Device by Does not apply route every 14 (fourteen) days.   irbesartan 300 MG tablet Commonly known as: AVAPRO Take 300 mg by mouth daily.   lamoTRIgine 200 MG tablet Commonly known as: LAMICTAL Take 100 mg by mouth every evening.   levothyroxine 50 MCG tablet Commonly known as: SYNTHROID Take 50 mcg by mouth daily before breakfast.   metFORMIN 500 MG 24 hr tablet Commonly known as: GLUCOPHAGE-XR Take 4 tablets (2,000 mg total) by mouth  daily.   OneTouch Delica Lancets 33G Misc Use to check blood sugars 2 times daily as needed   OneTouch Verio test strip Generic drug: glucose blood Use yo check blood sugar 2 times daily as needed   OneTouch Verio w/Device Kit Use to check blood sugars 2 times daily as needed   repaglinide 0.5 MG tablet Commonly known as: PRANDIN Take 1 tablet (0.5 mg total) by mouth 3 (three) times daily before meals.   Trulicity 4.5 MG/0.5ML Sopn Generic drug: Dulaglutide Inject 4.5 mg as directed once a week.         OBJECTIVE:   Vital Signs: There were no vitals taken for this visit.     Exam: General: Pt appears well and is in NAD Right neck shunt palpated   Lungs: Clear with good  BS bilat   Heart: RRR   Extremities: Trace pretibial edema.   Neuro: MS is good with appropriate affect, pt is alert and Ox3   DM Foot Exam 01/30/2023  The skin of the feet is intact without sores or ulcerations. The pedal pulses are 2+ on right and 2+ on left. The sensation is intact to a screening 5.07, 10 gram monofilament bilaterally   DATA REVIEWED:  Lab Results  Component Value Date   HGBA1C 6.9 (A) 01/30/2023   HGBA1C 7.1 (A) 09/08/2022   HGBA1C 7.8 (A) 04/03/2022   Lab Results  Component Value Date   LDLCALC 124 (H) 02/22/2016   CREATININE 0.56 02/10/2021   No results found for: "MICRALBCREAT"   Lab Results  Component Value Date   CHOL 188 02/22/2016   HDL 46 02/22/2016   LDLCALC 124 (H) 02/22/2016   TRIG 89 02/22/2016   CHOLHDL 4.1 02/22/2016         ASSESSMENT / PLAN / RECOMMENDATIONS:   1) Type 2 Diabetes Mellitus, Optimally controlled, Without complications - Most recent A1c of 6.9 %. Goal A1c < 7.0 %.     - In reviewing CGM download, the pt has been noted with recurrent hypoglycemia postprandial, I am not clear about the accuracy of these readings, as she does not seem to have symptoms. I have asked her to verify with a finger stick especially when she has a new  sensor on . In the meantime I will reduce repaglinide as below  - She also has been taking less Metformin than previously prescribed , will increase   - I will switch freestyle libre 14 to [place] 3 , a sample provided today  - She will contact us with glucose meter sample at home to send strips     MEDICATIONS: Continue  Metformin 500 mg XR 2 tabs BID  Decrease Repaglinide to 0.5 mg TIDQAC Continue Trulicity 4.5 mg weekly  Continue Jardiance 25 mg daily   EDUCATION / INSTRUCTIONS: BG monitoring instructions: Patient is instructed to check her blood sugars 3 times a day, before meals . Call [place] [facility] clinic if: BG persistently < 70  I reviewed the Rule of 15 for the treatment of hypoglycemia in detail with the patient. Literature supplied.    2) Diabetic complications:  Eye: Does not have known diabetic retinopathy.  Neuro/ Feet: Does not have known diabetic peripheral neuropathy .  Renal: Patient does not have known baseline CKD. She   is  on an ACEI/ARB at present.    3) Dyslipidemia:   - Historically LDL above goal  - She was on a statin but she was skeptical about side effects and currently on Zetia, we discussed the cardiovascular benefits of statin  therapy and encouraged her to rethink    F/U in 6 months     Signed electronically by: Lyndle Herrlich, MD  [facility]  [facility] [address] [place] [place], [place] [address] Phone: [phone] FAX: [phone]   CC: Caffie Damme, MD [address] [facility] [place] [address] Phone: [phone]  Fax: [phone]  Return to Endocrinology clinic as below: Future Appointments  Date Time Provider Department Center  07/31/2023 11:50 AM , Konrad Dolores, MD [id number] None

## 2023-08-03 ENCOUNTER — Encounter: Payer: Self-pay | Admitting: Internal Medicine

## 2023-08-03 ENCOUNTER — Ambulatory Visit (INDEPENDENT_AMBULATORY_CARE_PROVIDER_SITE_OTHER): Payer: Medicare Other | Admitting: Internal Medicine

## 2023-08-03 VITALS — BP 128/88 | HR 95 | Wt 167.0 lb

## 2023-08-03 DIAGNOSIS — E118 Type 2 diabetes mellitus with unspecified complications: Secondary | ICD-10-CM

## 2023-08-03 DIAGNOSIS — Z7985 Long-term (current) use of injectable non-insulin antidiabetic drugs: Secondary | ICD-10-CM

## 2023-08-03 DIAGNOSIS — E1165 Type 2 diabetes mellitus with hyperglycemia: Secondary | ICD-10-CM | POA: Diagnosis not present

## 2023-08-03 DIAGNOSIS — Z7984 Long term (current) use of oral hypoglycemic drugs: Secondary | ICD-10-CM

## 2023-08-03 LAB — POCT GLYCOSYLATED HEMOGLOBIN (HGB A1C): Hemoglobin A1C: 8.5 % — AB (ref 4.0–5.6)

## 2023-08-03 MED ORDER — SEMAGLUTIDE (1 MG/DOSE) 4 MG/3ML ~~LOC~~ SOPN: 1.0000 mg | PEN_INJECTOR | SUBCUTANEOUS | 3 refills | Status: DC

## 2023-08-03 MED ORDER — REPAGLINIDE 0.5 MG PO TABS: 0.5000 mg | ORAL_TABLET | ORAL | 2 refills | Status: DC

## 2023-08-03 MED ORDER — EMPAGLIFLOZIN 25 MG PO TABS: 25.0000 mg | ORAL_TABLET | Freq: Every day | ORAL | 3 refills | Status: DC

## 2023-08-03 MED ORDER — METFORMIN HCL ER 500 MG PO TB24: 2000.0000 mg | ORAL_TABLET | Freq: Every day | ORAL | 3 refills | Status: AC

## 2023-08-03 NOTE — Patient Instructions (Addendum)
Switch Trulicity to Ozempic 1 mg weekly Change Repaglinide 0.5 mg, 1 tablet before breakfast, 1 tablet before lunch, TWO tablets before supper Continue  Metformin 500 mg XR, to TWO tablets with Breakfast and TWO tablets with Supper Continue Jardiance 25 mg daily    HOW TO TREAT LOW BLOOD SUGARS (Blood sugar LESS THAN 70 MG/DL) Please follow the RULE OF 15 for the treatment of hypoglycemia treatment (when your (blood sugars are less than 70 mg/dL)   STEP 1: Take 15 grams of carbohydrates when your blood sugar is low, which includes:  3-4 GLUCOSE TABS  OR 3-4 OZ OF JUICE OR REGULAR SODA OR ONE TUBE OF GLUCOSE GEL    STEP 2: RECHECK blood sugar in 15 MINUTES STEP 3: If your blood sugar is still low at the 15 minute recheck --> then, go back to STEP 1 and treat AGAIN with another 15 grams of carbohydrates.

## 2023-08-03 NOTE — Progress Notes (Signed)
Name: [name]  Age/ Sex: 52 y.o., female   MRN/ DOB: [id number], August 16, 1971     PCP: Caffie Damme, MD   Reason for Endocrinology Evaluation: Type 2 Diabetes Mellitus  Initial Endocrine Consultative Visit: 06/24/2021    PATIENT IDENTIFIER: [name] is a 52 y.o. female with a past medical history of T2DM, dyslipidemia, bipolar d/o, CHF, ventriculoperitoneal shunt . The patient has followed with Endocrinology clinic since 06/24/2021 for consultative assistance with management of her diabetes.  DIABETIC HISTORY:  Ms. Llaguno was diagnosed with DM 2007, she was on insulin between 2007 and 2010 . Her hemoglobin A1c has ranged from 6.1% in 2016, peaking at 12.4% in 2022.   She was seen by Dr. Everardo All from 06/2021 until 03/2022    On her initial visit with me, she had an A1c of 7.1%, she was on bromocriptine, metformin, repaglinide, Trulicity, and Jardiance.  We stopped bromocriptine, increase metformin and decrease repaglinide, continue Trulicity and Jardiance  SUBJECTIVE:   During the last visit (01/30/2023): A1c 6.9%      Today (08/03/2023): Ms. Wortmann  She checks her blood sugars multiple  times daily. The patient has not  had hypoglycemic episodes since the last clinic visit.     Denies nausea, vomiting  Denies diarrhea but has occasional constipation   Used to be on statin but she didn't  like it when she read about it and was switched on Zetia     HOME DIABETES REGIMEN:   Trulicity 4.5 mg weekly  Jardiance 25 mg daily  Repaglinide 0.5 mg TIDQAC Metformin 500 mg 2 tabs BID     Statin: no ACE-I/ARB: yes Prior Diabetic Education: yes   CONTINUOUS GLUCOSE MONITORING RECORD INTERPRETATION    Dates of Recording: 8/3-8/16/2024  Sensor description:freestyle libre   Results statistics:   CGM use % of time 85  Average and SD 192/27.7  Time in range 48 %  % Time Above 180 36  % Time above 250 16  % Time Below target 0   Glycemic patterns summary: BG's  trend down overnight and fluctuate during the day  Hyperglycemic episodes  postprandial   Hypoglycemic episodes occurred N/A  Overnight periods: Optimal    DIABETIC COMPLICATIONS: Microvascular complications:   Denies: neuropathy, retinopathy ,  Last Eye Exam: Completed 05/2022  Macrovascular complications:   Denies: CAD, CVA, PVD   HISTORY:  Past Medical History:  Past Medical History:  Diagnosis Date   Anemia microcytic 03/31/2013   Bipolar disorder (HCC) 03/31/2013   CHF (congestive heart failure) (HCC)    Diabetes mellitus without complication (HCC)    Hyperlipidemia 03/31/2013   Hypertension    Obesity hypoventilation syndrome (HCC)    Plantar fasciitis of left foot    Pneumonia    Pneumothorax    Pulmonary infiltrates    Rhabdomyolysis 03/31/2013   [facility] secondary to abilify   Vitamin D deficiency 03/31/2013   Past Surgical History:  Past Surgical History:  Procedure Laterality Date   CESAREAN SECTION     x2   CYST EXCISION     DILATATION & CURETTAGE/HYSTEROSCOPY WITH MYOSURE N/A 02/17/2016   Procedure: DILATATION & CURETTAGE/HYSTEROSCOPY/Myomectomy WITH MYOSURE;  Surgeon: Noland Fordyce, MD;  Location: [facility];  Service: Gynecology;  Laterality: N/A;   IR ANGIOGRAM PELVIS SELECTIVE OR SUPRASELECTIVE  03/29/2017   IR ANGIOGRAM PELVIS SELECTIVE OR SUPRASELECTIVE  03/29/2017   IR ANGIOGRAM SELECTIVE EACH ADDITIONAL VESSEL  03/29/2017   IR ANGIOGRAM SELECTIVE EACH ADDITIONAL VESSEL  03/29/2017   IR  EMBO TUMOR ORGAN ISCHEMIA INFARCT INC GUIDE ROADMAPPING  03/29/2017   IR GENERIC HISTORICAL  11/14/2016   IR RADIOLOGIST EVAL & MGMT 11/14/2016 Berdine Dance, MD [facility] INTERV RAD   IR RADIOLOGIST EVAL & MGMT  05/16/2017   IR US GUIDE VASC ACCESS RIGHT  03/29/2017   MYOMECTOMY N/A 02/17/2016   Procedure: Claria Dice WITH Jackquline Denmark;  Surgeon: Noland Fordyce, MD;  Location: [facility];  Service: Gynecology;  Laterality: N/A;   VENTRICULOPERITONEAL SHUNT     Social History:  reports that  she has never smoked. She has never used smokeless tobacco. She reports that she does not drink alcohol and does not use drugs. Family History:  Family History  Problem Relation Age of Onset   Diabetes Mother    Hyperlipidemia Mother    Hypertension Mother    Lung cancer Father    Hypertension Brother      HOME MEDICATIONS: Allergies as of 08/03/2023       Reactions   Abilify [aripiprazole] Other (See Comments)   Reaction:  Hyperglycemia        Medication List        Accurate as of August 03, 2023 12:41 PM. If you have any questions, ask your nurse or doctor.          albuterol 108 (90 Base) MCG/ACT inhaler Commonly known as: VENTOLIN HFA Inhale 2 puffs into the lungs every 6 (six) hours as needed for wheezing or shortness of breath.   albuterol 108 (90 Base) MCG/ACT inhaler Commonly known as: VENTOLIN HFA INHALE 2 PUFFS INTO THE LUNGS EVERY SIX HOURS AS NEEDED FOR WHEEZING OR SHORTNESS OF BREATH.   amLODipine 10 MG tablet Commonly known as: NORVASC Take 10 mg by mouth daily.   divalproex 500 MG 24 hr tablet Commonly known as: DEPAKOTE ER Take 500 mg by mouth at bedtime.   empagliflozin 25 MG Tabs tablet Commonly known as: Jardiance Take 1 tablet (25 mg total) by mouth daily.   ezetimibe 10 MG tablet Commonly known as: ZETIA Take 10 mg by mouth daily.   FreeStyle Libre 3 Sensor Misc 1 Device by Does not apply route every 14 (fourteen) days.   irbesartan 300 MG tablet Commonly known as: AVAPRO Take 300 mg by mouth daily.   lamoTRIgine 200 MG tablet Commonly known as: LAMICTAL Take 100 mg by mouth every evening.   levothyroxine 50 MCG tablet Commonly known as: SYNTHROID Take 50 mcg by mouth daily before breakfast.   metFORMIN 500 MG 24 hr tablet Commonly known as: GLUCOPHAGE-XR Take 4 tablets (2,000 mg total) by mouth daily.   OneTouch Delica Lancets 33G Misc Use to check blood sugars 2 times daily as needed   OneTouch Verio test  strip Generic drug: glucose blood Use yo check blood sugar 2 times daily as needed   OneTouch Verio w/Device Kit Use to check blood sugars 2 times daily as needed   repaglinide 0.5 MG tablet Commonly known as: PRANDIN Take 1 tablet (0.5 mg total) by mouth 3 (three) times daily before meals.   Trulicity 4.5 MG/0.5ML Sopn Generic drug: Dulaglutide Inject 4.5 mg as directed once a week.         OBJECTIVE:   Vital Signs: BP 128/88 (BP Location: Left Arm, Patient Position: Sitting, Cuff Size: Small)   Pulse 95   Wt 167 lb (75.8 kg)   SpO2 96%   BMI 36.46 kg/m      Exam: General: Pt appears well and is in NAD Right neck shunt  palpated   Lungs: Clear with good BS bilat   Heart: RRR   Extremities: Trace pretibial edema.   Neuro: MS is good with appropriate affect, pt is alert and Ox3   DM Foot Exam 01/30/2023  The skin of the feet is intact without sores or ulcerations. The pedal pulses are 2+ on right and 2+ on left. The sensation is intact to a screening 5.07, 10 gram monofilament bilaterally   DATA REVIEWED:  Lab Results  Component Value Date   HGBA1C 8.5 (A) 08/03/2023   HGBA1C 6.9 (A) 01/30/2023   HGBA1C 7.1 (A) 09/08/2022   Lab Results  Component Value Date   LDLCALC 124 (H) 02/22/2016   CREATININE 0.56 02/10/2021   No results found for: "MICRALBCREAT"   Lab Results  Component Value Date   CHOL 188 02/22/2016   HDL 46 02/22/2016   LDLCALC 124 (H) 02/22/2016   TRIG 89 02/22/2016   CHOLHDL 4.1 02/22/2016         ASSESSMENT / PLAN / RECOMMENDATIONS:   1) Type 2 Diabetes Mellitus, poorly controlled, Without complications - Most recent A1c of 8.5 %. Goal A1c < 7.0 %.     -Poorly controlled diabetes due to dietary indiscretion and medication nonadherence -In reviewing her CGM data, patient has been noted with severe hypoglycemia at the end of the day -I will increase repaglinide at suppertime as below -I have recommended switching Trulicity to  Ozempic due to persistent hyperglycemia on Trulicity    MEDICATIONS: Continue  Metformin 500 mg XR 2 tabs BID  Change  Repaglinide to 0.5 mg , 1 tablet before breakfast, 1 tablet before lunch, and 2 tablets before supper continue Trulicity 4.5 mg weekly  Continue Jardiance 25 mg daily  Switch Trulicity to Ozempic 1 mg weekly  EDUCATION / INSTRUCTIONS: BG monitoring instructions: Patient is instructed to check her blood sugars 3 times a day, before meals . Call [facility] if: BG persistently < 70  I reviewed the Rule of 15 for the treatment of hypoglycemia in detail with the patient. Literature supplied.    2) Diabetic complications:  Eye: Does not have known diabetic retinopathy.  Neuro/ Feet: Does not have known diabetic peripheral neuropathy .  Renal: Patient does not have known baseline CKD. She   is  on an ACEI/ARB at present.    3) Dyslipidemia:   - Historically LDL above goal  - She was on a statin but she was skeptical about side effects and currently on Zetia, we discussed the cardiovascular benefits of statin  therapy and encouraged her to rethink    F/U in 6 months     I spent 25 minutes preparing to see the patient by review of recent labs, imaging and procedures, obtaining and reviewing separately obtained history, communicating with the patient/family or caregiver, ordering medications, tests or procedures, and documenting clinical information in the EHR including the differential Dx, treatment, and any further evaluation and other management    Signed electronically by: Lyndle Herrlich, MD  [facility] Endocrinology  [facility] [address] [place] [place], [place] [address] Phone: [phone] FAX: [phone]   CC: Caffie Damme, MD [address] [facility] [place] [address] Phone: [phone]  Fax: [phone]  Return to Endocrinology clinic as below: No future appointments.

## 2023-08-06 ENCOUNTER — Other Ambulatory Visit: Payer: Self-pay

## 2023-08-16 ENCOUNTER — Emergency Department (HOSPITAL_COMMUNITY)
Admission: EM | Admit: 2023-08-16 | Discharge: 2023-08-16 | Disposition: A | Discharge status: Home / Self Care | Payer: Medicare Other | Attending: Emergency Medicine | Admitting: Emergency Medicine

## 2023-08-16 ENCOUNTER — Encounter (HOSPITAL_COMMUNITY): Payer: Self-pay

## 2023-08-16 ENCOUNTER — Other Ambulatory Visit: Payer: Self-pay

## 2023-08-16 DIAGNOSIS — Z7984 Long term (current) use of oral hypoglycemic drugs: Secondary | ICD-10-CM | POA: Insufficient documentation

## 2023-08-16 DIAGNOSIS — Z79899 Other long term (current) drug therapy: Secondary | ICD-10-CM | POA: Insufficient documentation

## 2023-08-16 DIAGNOSIS — K649 Unspecified hemorrhoids: Secondary | ICD-10-CM | POA: Insufficient documentation

## 2023-08-16 MED ORDER — HYDROCORTISONE ACETATE 25 MG RE SUPP: 25.0000 mg | Freq: Two times a day (BID) | RECTAL | 0 refills | Status: AC

## 2023-08-16 NOTE — ED Provider Notes (Signed)
[place] EMERGENCY DEPARTMENT AT [facility] Provider Note   CSN: [id number] Arrival date & time: 08/16/23  1348     History  Chief Complaint  Patient presents with   Hemorrhoids    [name] is a 52 y.o. female.  52 year old female presents today for concern of hemorrhoidal pain.  Ongoing for about 5 or 6 weeks.  She states she was seen by her PCP and prescribed some topical spray and she has also been using Preparation H without significant improvement.  She has not been given a referral to gastroenterology yet.  Denies any fever, rectal bleeding.  Does endorse some pain with bowel movements.  No other complaints.  Denies associated fever, nausea, vomiting.  States she does have history of hemorrhoids.  The history is provided by the patient. No language interpreter was used.       Home Medications Prior to Admission medications   Medication Sig Start Date End Date Taking? Authorizing Provider  albuterol (VENTOLIN HFA) 108 (90 Base) MCG/ACT inhaler Inhale 2 puffs into the lungs every 6 (six) hours as needed for wheezing or shortness of breath. 02/10/21   Ghimire, [place] Lean, MD  albuterol (VENTOLIN HFA) 108 (90 Base) MCG/ACT inhaler INHALE 2 PUFFS INTO THE LUNGS EVERY SIX HOURS AS NEEDED FOR WHEEZING OR SHORTNESS OF BREATH. 02/10/21 02/10/22  Ghimire, Werner Lean, MD  amLODipine (NORVASC) 10 MG tablet Take 10 mg by mouth daily. 08/23/19   [provider]  Blood Glucose Monitoring Suppl (ONETOUCH VERIO) w/Device KIT Use to check blood sugars 2 times daily as needed 03/06/23   Shamleffer, Konrad Dolores, MD  Continuous Blood Gluc Sensor (FREESTYLE LIBRE 3 SENSOR) MISC 1 Device by Does not apply route every 14 (fourteen) days. 01/30/23   Shamleffer, Konrad Dolores, MD  divalproex (DEPAKOTE ER) 500 MG 24 hr tablet Take 500 mg by mouth at bedtime.    [provider]  empagliflozin (JARDIANCE) 25 MG TABS tablet Take 1 tablet (25 mg total) by mouth daily.  08/03/23   Shamleffer, Konrad Dolores, MD  ezetimibe (ZETIA) 10 MG tablet Take 10 mg by mouth daily.    [provider]  glucose blood (ONETOUCH VERIO) test strip Use yo check blood sugar 2 times daily as needed 02/27/23   Shamleffer, Konrad Dolores, MD  irbesartan (AVAPRO) 300 MG tablet Take 300 mg by mouth daily.    [provider]  lamoTRIgine (LAMICTAL) 200 MG tablet Take 100 mg by mouth every evening.    [provider]  levothyroxine (SYNTHROID, LEVOTHROID) 50 MCG tablet Take 50 mcg by mouth daily before breakfast.    [provider]  metFORMIN (GLUCOPHAGE-XR) 500 MG 24 hr tablet Take 4 tablets (2,000 mg total) by mouth daily. 08/03/23   Shamleffer, Konrad Dolores, MD  OneTouch Delica Lancets 33G MISC Use to check blood sugars 2 times daily as needed 02/27/23   Shamleffer, Konrad Dolores, MD  repaglinide (PRANDIN) 0.5 MG tablet Take 1 tablet (0.5 mg total) by mouth as directed. 08/03/23   Shamleffer, Konrad Dolores, MD  Semaglutide, 1 MG/DOSE, 4 MG/3ML SOPN Inject 1 mg as directed once a week. To replace Trulicity 08/03/23   Shamleffer, Konrad Dolores, MD      Allergies    Abilify [aripiprazole]    Review of Systems   Review of Systems  Constitutional:  Negative for fever.  Gastrointestinal:  Positive for rectal pain. Negative for anal bleeding and blood in stool.  All other systems reviewed and are negative.  Physical Exam Updated Vital Signs BP (!) 155/103 (BP Location: Left Arm)   Pulse 86   Temp 98.7 F (37.1 C) (Oral)   Resp 18   Ht 4\' 8"  (1.422 m)   Wt 75.8 kg   SpO2 94%   BMI 37.44 kg/m  Physical Exam Vitals and nursing note reviewed.  Constitutional:      General: She is not in acute distress.    Appearance: Normal appearance. She is not ill-appearing.  HENT:     Head: Normocephalic and atraumatic.     Nose: Nose normal.  Eyes:     Conjunctiva/sclera: Conjunctivae normal.  Pulmonary:     Effort: Pulmonary effort is  normal. No respiratory distress.  Abdominal:     General: There is no distension.     Palpations: Abdomen is soft.     Tenderness: There is no abdominal tenderness. There is no guarding.  Genitourinary:    Comments: Patient defers rectal exam Musculoskeletal:        General: No deformity. Normal range of motion.     Cervical back: Normal range of motion.  Skin:    Findings: No rash.  Neurological:     Mental Status: She is alert.     ED Results / Procedures / Treatments   Labs (all labs ordered are listed, but only abnormal results are displayed) Labs Reviewed - No data to display  EKG None  Radiology No results found.  Procedures Procedures    Medications Ordered in ED Medications - No data to display  ED Course/ Medical Decision Making/ A&P                                 Medical Decision Making  52 year old female presents today for concern of hemorrhoids.  She was prescribed some topical spray but is unsure of which one.  Has been using Preparation H without significant proving.  Has not been referred to gastroenterology.  She defers rectal exam today.  Discussed aggressive bowel regimen to keep stools loose for comfort as well as sitz bath.  Will prescribe Anusol and give GI referral.  Patient is in agreement.  Patient discharged in stable condition.  Hemodynamically stable.  Does not appear to be in acute distress.   Final Clinical Impression(s) / ED Diagnoses Final diagnoses:  Hemorrhoids, unspecified hemorrhoid type    Rx / DC Orders ED Discharge Orders          Ordered    hydrocortisone (ANUSOL-HC) 25 MG suppository  2 times daily        08/16/23 1759              Marita Kansas, PA-C 08/16/23 1802    Lonell Grandchild, MD 08/17/23 1335

## 2023-08-16 NOTE — ED Triage Notes (Signed)
Patient reports increasing pain with her hemorrhoids x 6 weeks. Patient has seen PC and they have prescribed her medications without relief.  VSS, NAD

## 2023-08-16 NOTE — Discharge Instructions (Signed)
I have been Anusol for you.  Have also given you referral to gastroenterology.  Please call their office to schedule an appointment.  If any concerning symptoms return to the emergency room otherwise follow-up with your primary care provider or gastroenterologist.

## 2023-09-15 LAB — AMB RESULTS CONSOLE CBG: Glucose: 266

## 2023-09-26 NOTE — Progress Notes (Signed)
Patient attended screening event 09/15/2023. Patient advised to follow up with provider for blood glucose 266 and blood pressure of 116/82. Patient does not have any SDOH needs at this time.

## 2023-11-12 ENCOUNTER — Encounter: Payer: Self-pay | Admitting: Dietician

## 2023-11-12 ENCOUNTER — Encounter: Payer: Medicare Other | Attending: Internal Medicine | Admitting: Dietician

## 2023-11-12 VITALS — Ht <= 58 in | Wt 162.0 lb

## 2023-11-12 DIAGNOSIS — E118 Type 2 diabetes mellitus with unspecified complications: Secondary | ICD-10-CM

## 2023-11-12 DIAGNOSIS — E1165 Type 2 diabetes mellitus with hyperglycemia: Secondary | ICD-10-CM | POA: Insufficient documentation

## 2023-11-12 DIAGNOSIS — Z713 Dietary counseling and surveillance: Secondary | ICD-10-CM | POA: Diagnosis not present

## 2023-11-12 NOTE — Patient Instructions (Addendum)
Consider keeping a journal and include  What you eat  Sensor readings  How you feel  When you take your medicine (opt)  Eat more Non-Starchy Vegetables These include greens, broccoli, cauliflower, cabbage, carrots, beets, eggplant, peppers, squash and others. Minimize added sugars and refined grains Rethink what you drink.  Choose beverages without added sugar.  Look for 0 carbs on the label. See the list of whole grains below.  Find alternatives to usual sweet treats. Choose whole foods over processed. Make simple meals at home more often than eating out.  Tips to increase fiber in your diet: (All plants have fiber.  Eat a variety. There are more than are on this list.) Slowly increase the amount of fiber you eat to 25-35 grams per day.  (More is fine if you tolerate it.) Fiber from whole grains, nuts and seeds Quinoa, 1/2 cup = 5 grams Bulgur, 1/2 cup = 4.1 grams Popcorn, 3 cups = 3.6 grams Whole Wheat Spaghetti, 1/2 cup = 3.2 grams Barley, 1/2 cup = 3 grams Oatmeal, 1/2 cup = 2 grams Whole Wheat English Muffin = 3 grams Corn, 1/2 cup = 2.1 grams Brown Rice, 1/2 cup = 1.8 grams Flax seeds, 1 Tablespoon = 2.8 grams Chia seeds, 1 Tablespoon = 11 grams Almonds, 1 ounce = 3.5 grams fiber Fiber from legumes Kidney beans, 1/2 cup 7.9 grams Lentils, 1/2 cup = 7.8 grams Pinto beans, 1/2 cup = 7.7 grams Black beans, 1/2 cup = 7.6 grams Lima beans, 1/2 cup 6.4 grams Chick peas, 1/2 cup = 5.3 grams Black eyed peas, 1/2 cup = 4 grams Fiber from fruits and vegetables Pear, 6 grams Apple. 3.3 grams Raspberries or Blackberries, 3/4 cup = 6 grams Strawberries or Blueberries, 1 cup = 3.4 grams Baked sweet potato 3.8 grams fiber Baked potato with skin 4.4 grams  Peas, 1/2 cup = 4.4 grams  Spinach, 1/2 cup cooked = 3.5 grams  Avocado, 1/2 = 5 grams  Tie habits such as walking to something that you enjoy such as listening to a book.  Consider Spindrift, poppi, Olipop, Zevia

## 2023-11-12 NOTE — Progress Notes (Unsigned)
Diabetes Self-Management Education  Visit Type: First/Initial  Appt. Start Time: 1315 Appt. End Time: 1415  11/13/2023  [name], identified by name and date of birth, is a 52 y.o. female with a diagnosis of Diabetes: Type 2.   ASSESSMENT Patient is here today alone.  She was last seen by another RD in this office in 2017. She would like to learn how to lower her A1C. During the past week she has been working on taking her medications as prescribed, less fast food, fried food, more vegetables and fruit and reducing late night snacking and junk food.  States that she struggles to take her medication consistently.  States that this is partially due to not accepting the need for medications and states that she needs to be more committed to this.  All of her diabetes medications have different times to take them (once per week, 30 minutes before meals, and another with meals)  Referral:  Type 2 Diabetes (2007), OSA (on c-pap), HLD, HTN, CHF, bipolar disorder, anemia, vitamin D deficiency Medications include:  Jardiace, metformin, repaglinide, Semaglutide, ezetimibe, irbesartan, Levothyroxine Labs noted to include:  A1C 8.5% 08/03/2023 increased from 6.9% 01/30/2023 Sleep - improved, takes a nap daily CGM:  FreeStyle Libre 3  Time in range has improved to 70% for the past 7 days which has improved from 22% for the past14. Days.   CGM Results from download: 11/12/2023  % Time CGM active:   43 %   (Goal >70%)  Average glucose:   240 mg/dL for 14 days 295M8 days  Glucose management indicator:   9.1 %  Time in range (70-180 mg/dL):   22 %   (Goal >41%)  Time High (181-250 mg/dL):   29 %   (Goal < 32%)  Time Very High (>250 mg/dL):    49 %   (Goal < 5%)  Time Low (54-69 mg/dL):   0 %   (Goal <4%)  Time Very Low (<54 mg/dL):   0 %   (Goal <4%)      162 lbs 11/12/2023  Patient lives with her husband and 2 adult children.  They share shopping and cooking. She is not currently  working.  She retired from [employer] in 2022. She received a nutrition and fitness program certificate many years ago.   She does not have motivation to exercise and has not challenged herself to exercise.  She has a Photographer at [employer].  Height 4\' 8"  (1.422 m), weight 162 lb (73.5 kg). Body mass index is 36.32 kg/m.   Diabetes Self-Management Education - 11/12/23 1348       Visit Information   Visit Type First/Initial      Initial Visit   Diabetes Type Type 2    Date Diagnosed 2007    Are you currently following a meal plan? No    Are you taking your medications as prescribed? No      Health Coping   How would you rate your overall health? Fair      Psychosocial Assessment   Patient Belief/Attitude about Diabetes Motivated to manage diabetes    What is the hardest part about your diabetes right now, causing you the most concern, or is the most worrisome to you about your diabetes?   Making healty food and beverage choices;Being active;Other (comment)   mindset   Self-care barriers None    Self-management support Doctor's office    Other persons present Patient    Patient Concerns  Nutrition/Meal planning;Problem Solving;Glycemic Control    Special Needs None    Preferred Learning Style No preference indicated    Learning Readiness Ready    How often do you need to have someone help you when you read instructions, pamphlets, or other written materials from your doctor or pharmacy? 1 - Never    What is the last grade level you completed in school? 2 years college      Pre-Education Assessment   Patient understands the diabetes disease and treatment process. Needs Review    Patient understands incorporating nutritional management into lifestyle. Needs Review    Patient undertands incorporating physical activity into lifestyle. Needs Review    Patient understands using medications safely. Needs Review    Patient understands monitoring blood glucose, interpreting and using  results Needs Review    Patient understands prevention, detection, and treatment of acute complications. Needs Review    Patient understands prevention, detection, and treatment of chronic complications. Needs Review    Patient understands how to develop strategies to address psychosocial issues. Needs Review    Patient understands how to develop strategies to promote health/change behavior. Needs Review      Complications   Last HgB A1C per patient/outside source 8.5 %   08/03/2023   How often do you check your blood sugar? > 4 times/day    Fasting Blood glucose range (mg/dL) 09-323    Postprandial Blood glucose range (mg/dL) >557    Number of hypoglycemic episodes per month 0    Number of hyperglycemic episodes ( >200mg /dL): Daily    Can you tell when your blood sugar is high? No    Have you had a dilated eye exam in the past 12 months? Yes    Have you had a dental exam in the past 12 months? No    Are you checking your feet? Yes    How many days per week are you checking your feet? 4      Dietary Intake   Breakfast fruit loops, skim milk    Snack (morning) none    Lunch none    Snack (afternoon) potato chips, gingerale   didn't feel well   Dinner none    Snack (evening) cheesecake    Beverage(s) water,  regular gingerale (20 oz per day), coffee with regular sweetened creamer, unsweetened or sweetened tea, hot water and lemon      Activity / Exercise   Activity / Exercise Type ADL's    How many days per week do you exercise? 0    How many minutes per day do you exercise? 0    Total minutes per week of exercise 0      Patient Education   Previous Diabetes Education Yes (please comment)   2017   Disease Pathophysiology Definition of diabetes, type 1 and 2, and the diagnosis of diabetes    Healthy Eating Role of diet in the treatment of diabetes and the relationship between the three main macronutrients and blood glucose level;Plate Method;Meal options for control of blood glucose  level and chronic complications.    Being Active Role of exercise on diabetes management, blood pressure control and cardiac health.;Helped patient identify appropriate exercises in relation to his/her diabetes, diabetes complications and other health issue.    Medications Reviewed patients medication for diabetes, action, purpose, timing of dose and side effects.    Monitoring Identified appropriate SMBG and/or A1C goals.;Taught/evaluated CGM (comment)    Acute complications Taught prevention, symptoms, and  treatment of  hypoglycemia - the 15 rule.    Chronic complications Identified and discussed with patient  current chronic complications    Diabetes Stress and Support Identified and addressed patients feelings and concerns about diabetes;Worked with patient to identify barriers to care and solutions;Role of stress on diabetes      Individualized Goals (developed by patient)   Nutrition General guidelines for healthy choices and portions discussed    Physical Activity Exercise 5-7 days per week;30 minutes per day    Medications take my medication as prescribed    Monitoring  Consistenly use CGM    Problem Solving Medication consistency;Eating Pattern;Addressing barriers to behavior change    Reducing Risk examine blood glucose patterns;do foot checks daily;treat hypoglycemia with 15 grams of carbs if blood glucose less than 70mg /dL      Post-Education Assessment   Patient understands the diabetes disease and treatment process. Demonstrates understanding / competency    Patient understands incorporating nutritional management into lifestyle. Comprehends key points    Patient undertands incorporating physical activity into lifestyle. Comprehends key points    Patient understands using medications safely. Comphrehends key points    Patient understands monitoring blood glucose, interpreting and using results Comprehends key points    Patient understands prevention, detection, and treatment of  acute complications. Comprehends key points    Patient understands prevention, detection, and treatment of chronic complications. Comprehends key points    Patient understands how to develop strategies to address psychosocial issues. Comprehends key points    Patient understands how to develop strategies to promote health/change behavior. Needs Review      Outcomes   Expected Outcomes Demonstrated interest in learning. Expect positive outcomes    Future DMSE 2 months    Program Status Not Completed             Individualized Plan for Diabetes Self-Management Training:   Learning Objective:  Patient will have a greater understanding of diabetes self-management. Patient education plan is to attend individual and/or group sessions per assessed needs and concerns.   Plan:   Patient Instructions  Consider keeping a journal and include  What you eat  Sensor readings  How you feel  When you take your medicine (opt)  Eat more Non-Starchy Vegetables These include greens, broccoli, cauliflower, cabbage, carrots, beets, eggplant, peppers, squash and others. Minimize added sugars and refined grains Rethink what you drink.  Choose beverages without added sugar.  Look for 0 carbs on the label. See the list of whole grains below.  Find alternatives to usual sweet treats. Choose whole foods over processed. Make simple meals at home more often than eating out.  Tips to increase fiber in your diet: (All plants have fiber.  Eat a variety. There are more than are on this list.) Slowly increase the amount of fiber you eat to 25-35 grams per day.  (More is fine if you tolerate it.) Fiber from whole grains, nuts and seeds Quinoa, 1/2 cup = 5 grams Bulgur, 1/2 cup = 4.1 grams Popcorn, 3 cups = 3.6 grams Whole Wheat Spaghetti, 1/2 cup = 3.2 grams Barley, 1/2 cup = 3 grams Oatmeal, 1/2 cup = 2 grams Whole Wheat English Muffin = 3 grams Corn, 1/2 cup = 2.1 grams Brown Rice, 1/2 cup = 1.8  grams Flax seeds, 1 Tablespoon = 2.8 grams Chia seeds, 1 Tablespoon = 11 grams Almonds, 1 ounce = 3.5 grams fiber Fiber from legumes Kidney beans, 1/2 cup 7.9 grams Lentils, 1/2 cup = 7.8 grams Pinto beans,  1/2 cup = 7.7 grams Black beans, 1/2 cup = 7.6 grams Lima beans, 1/2 cup 6.4 grams Chick peas, 1/2 cup = 5.3 grams Black eyed peas, 1/2 cup = 4 grams Fiber from fruits and vegetables Pear, 6 grams Apple. 3.3 grams Raspberries or Blackberries, 3/4 cup = 6 grams Strawberries or Blueberries, 1 cup = 3.4 grams Baked sweet potato 3.8 grams fiber Baked potato with skin 4.4 grams  Peas, 1/2 cup = 4.4 grams  Spinach, 1/2 cup cooked = 3.5 grams  Avocado, 1/2 = 5 grams  Tie habits such as walking to something that you enjoy such as listening to a book.  Consider Spindrift, poppi, Olipop, Zevia    Expected Outcomes:  Demonstrated interest in learning. Expect positive outcomes  Education material provided: ADA - How to Thrive: A Guide for Your Journey with Diabetes, Food label handouts, Snack sheet, and Diabetes Resources, meal plan card  If problems or questions, patient to contact team via:  Phone  Future DSME appointment: 2 months

## 2023-12-25 LAB — LAB REPORT - SCANNED
A1c: 9
Albumin, Urine POC: 37.9
Calcium: 10.2
Creatinine, POC: 29.2 mg/dL
EGFR: 167
Free T4: 1.32 ng/dL
Microalb Creat Ratio: 130
PTH: 54.8
TSH: 2.54 (ref 0.41–5.90)

## 2024-01-03 NOTE — Progress Notes (Signed)
The patient attended 09/15/23 screening event where her BP screening results was wnl, and her blood glucose was 266 mg/dl. At the event the patient noted she has a pcp, insurance and does not smoke. Patient did not have any SDOH insecurities. Per chart review pt does have a pcp.   The last office was with Judithe Modest MD referred by Caffie Damme her pcp on 08/03/23. Chart review also indicates two future appts on 01/04/24 and 01/14/24.  Per chart review Type 2 diabetes is listed in the pt's problem list and her pcp is aware of her diabetes status. Pt is also seeing a nutritionist. Pt has Metformin on her current meds list. No additional Health equity team support indicated at this time.

## 2024-01-04 ENCOUNTER — Encounter: Payer: Self-pay | Admitting: Internal Medicine

## 2024-01-04 ENCOUNTER — Ambulatory Visit (INDEPENDENT_AMBULATORY_CARE_PROVIDER_SITE_OTHER): Payer: Medicare Other | Admitting: Internal Medicine

## 2024-01-04 VITALS — BP 124/82 | HR 85 | Ht <= 58 in | Wt 166.0 lb

## 2024-01-04 DIAGNOSIS — Z7984 Long term (current) use of oral hypoglycemic drugs: Secondary | ICD-10-CM | POA: Diagnosis not present

## 2024-01-04 DIAGNOSIS — Z7985 Long-term (current) use of injectable non-insulin antidiabetic drugs: Secondary | ICD-10-CM

## 2024-01-04 DIAGNOSIS — E1165 Type 2 diabetes mellitus with hyperglycemia: Secondary | ICD-10-CM | POA: Diagnosis not present

## 2024-01-04 MED ORDER — GLIPIZIDE 5 MG PO TABS: 5.0000 mg | ORAL_TABLET | Freq: Two times a day (BID) | ORAL | 3 refills | Status: AC

## 2024-01-04 NOTE — Progress Notes (Signed)
Name: [name]  Age/ Sex: 53 y.o., female   MRN/ DOB: [id number], Oct 23, 1971     PCP: Caffie Damme, MD   Reason for Endocrinology Evaluation: Type 2 Diabetes Mellitus  Initial Endocrine Consultative Visit: 06/24/2021    PATIENT IDENTIFIER: [name] is a 53 y.o. female with a past medical history of T2DM, dyslipidemia, bipolar d/o, CHF, ventriculoperitoneal shunt . The patient has followed with Endocrinology clinic since 06/24/2021 for consultative assistance with management of her diabetes.  DIABETIC HISTORY:  [name] was diagnosed with DM 2007, she was on insulin between 2007 and 2010 . Her hemoglobin A1c has ranged from 6.1% in 2016, peaking at 12.4% in 2022.   She was seen by Dr. Everardo All from 06/2021 until 03/2022    On her initial visit with me, she had an A1c of 7.1%, she was on bromocriptine, metformin, repaglinide, Trulicity, and Jardiance.  We stopped bromocriptine, increase metformin and decrease repaglinide, continue Trulicity and Jardiance  SUBJECTIVE:   During the last visit (08/03/2023): A1c 8.5%      Today (01/04/2024): [name]  She checks her blood sugars multiple  times daily. The patient has not  had hypoglycemic episodes since the last clinic visit.  I have attempted to switch Trulicity to Ozempic but she continues to be on Trulicity due to financial hardship  Denies nausea, vomiting  Denies diarrhea or diarrhea   Used to be on statin but she didn't  like it when she read about it and was switched on Zetia     HOME DIABETES REGIMEN:   Trulicity 4.5 mg weekly  Jardiance 25 mg daily  Repaglinide 0.5 mg, 1 tablet before breakfast, 1 tablet before lunch, and 2 tablets before supper  Metformin 500 mg 2 tabs BID     Statin: no ACE-I/ARB: yes Prior Diabetic Education: yes   CONTINUOUS GLUCOSE MONITORING RECORD INTERPRETATION    Dates of Recording: 1/4-1/17/2025  Sensor description:freestyle libre   Results statistics:   CGM use % of  time 96  Average and SD 235/36.9  Time in range 31%  % Time Above 180 36  % Time above 250 33  % Time Below target 0   Glycemic patterns summary: BGs are high all day and night  Hyperglycemic episodes  postprandial   Hypoglycemic episodes occurred N/A  Overnight periods: High    DIABETIC COMPLICATIONS: Microvascular complications:   Denies: neuropathy, retinopathy ,  Last Eye Exam: Completed 05/2022  Macrovascular complications:   Denies: CAD, CVA, PVD   HISTORY:  Past Medical History:  Past Medical History:  Diagnosis Date   Anemia microcytic 03/31/2013   Bipolar disorder (HCC) 03/31/2013   CHF (congestive heart failure) (HCC)    Diabetes mellitus without complication (HCC)    Hyperlipidemia 03/31/2013   Hypertension    Obesity hypoventilation syndrome (HCC)    Plantar fasciitis of left foot    Pneumonia    Pneumothorax    Pulmonary infiltrates    Rhabdomyolysis 03/31/2013   [facility] secondary to abilify   Vitamin D deficiency 03/31/2013   Past Surgical History:  Past Surgical History:  Procedure Laterality Date   CESAREAN SECTION     x2   CYST EXCISION     DILATATION & CURETTAGE/HYSTEROSCOPY WITH MYOSURE N/A 02/17/2016   Procedure: DILATATION & CURETTAGE/HYSTEROSCOPY/Myomectomy WITH MYOSURE;  Surgeon: Noland Fordyce, MD;  Location: [facility];  Service: Gynecology;  Laterality: N/A;   IR ANGIOGRAM PELVIS SELECTIVE OR SUPRASELECTIVE  03/29/2017   IR ANGIOGRAM PELVIS SELECTIVE OR SUPRASELECTIVE  03/29/2017   IR ANGIOGRAM SELECTIVE EACH ADDITIONAL VESSEL  03/29/2017   IR ANGIOGRAM SELECTIVE EACH ADDITIONAL VESSEL  03/29/2017   IR EMBO TUMOR ORGAN ISCHEMIA INFARCT INC GUIDE ROADMAPPING  03/29/2017   IR GENERIC HISTORICAL  11/14/2016   IR RADIOLOGIST EVAL & MGMT 11/14/2016 Berdine Dance, MD [facility] RAD   IR RADIOLOGIST EVAL & MGMT  05/16/2017   IR US GUIDE VASC ACCESS RIGHT  03/29/2017   MYOMECTOMY N/A 02/17/2016   Procedure: Claria Dice WITH Jackquline Denmark;  Surgeon: Noland Fordyce, MD;  Location: [facility];  Service: Gynecology;  Laterality: N/A;   VENTRICULOPERITONEAL SHUNT     Social History:  reports that she has never smoked. She has never used smokeless tobacco. She reports that she does not drink alcohol and does not use drugs. Family History:  Family History  Problem Relation Age of Onset   Diabetes Mother    Hyperlipidemia Mother    Hypertension Mother    Lung cancer Father    Hypertension Brother      HOME MEDICATIONS: Allergies as of 01/04/2024       Reactions   Abilify [aripiprazole] Other (See Comments)   Reaction:  Hyperglycemia        Medication List        Accurate as of January 04, 2024 11:15 AM. If you have any questions, ask your nurse or doctor.          STOP taking these medications    repaglinide 0.5 MG tablet Commonly known as: PRANDIN Stopped by: Johnney Ou Ralonda Tartt       TAKE these medications    albuterol 108 (90 Base) MCG/ACT inhaler Commonly known as: VENTOLIN HFA Inhale 2 puffs into the lungs every 6 (six) hours as needed for wheezing or shortness of breath.   albuterol 108 (90 Base) MCG/ACT inhaler Commonly known as: VENTOLIN HFA INHALE 2 PUFFS INTO THE LUNGS EVERY SIX HOURS AS NEEDED FOR WHEEZING OR SHORTNESS OF BREATH.   amLODipine 10 MG tablet Commonly known as: NORVASC Take 10 mg by mouth daily.   cyclobenzaprine 5 MG tablet Commonly known as: FLEXERIL Take 5 mg by mouth at bedtime as needed.   diclofenac 75 MG EC tablet Commonly known as: VOLTAREN Take 75 mg by mouth 2 (two) times daily.   divalproex 500 MG 24 hr tablet Commonly known as: DEPAKOTE ER Take 500 mg by mouth at bedtime.   empagliflozin 25 MG Tabs tablet Commonly known as: Jardiance Take 1 tablet (25 mg total) by mouth daily.   estradiol 0.1 MG/GM vaginal cream Commonly known as: ESTRACE Place vaginally.   ezetimibe 10 MG tablet Commonly known as: ZETIA Take 10 mg by mouth daily.   FreeStyle Libre 3 Sensor  Misc 1 Device by Does not apply route every 14 (fourteen) days.   glipiZIDE 5 MG tablet Commonly known as: GLUCOTROL Take 1 tablet (5 mg total) by mouth 2 (two) times daily before a meal. Started by: Johnney Ou Alley Neils   hydrocortisone 2.5 % rectal cream Commonly known as: ANUSOL-HC Apply [phone] Applications topically 3 (three) times daily.   hydrocortisone 25 MG suppository Commonly known as: ANUSOL-HC Place 1 suppository (25 mg total) rectally 2 (two) times daily.   irbesartan 300 MG tablet Commonly known as: AVAPRO Take 300 mg by mouth daily.   lamoTRIgine 200 MG tablet Commonly known as: LAMICTAL Take 100 mg by mouth every evening.   levothyroxine 50 MCG tablet Commonly known as: SYNTHROID Take 50 mcg by mouth daily before  breakfast.   metFORMIN 500 MG 24 hr tablet Commonly known as: GLUCOPHAGE-XR Take 4 tablets (2,000 mg total) by mouth daily.   nystatin ointment Commonly known as: MYCOSTATIN Apply topically 2 (two) times daily.   OneTouch Delica Lancets 33G Misc Use to check blood sugars 2 times daily as needed   OneTouch Verio test strip Generic drug: glucose blood Use yo check blood sugar 2 times daily as needed   OneTouch Verio w/Device Kit Use to check blood sugars 2 times daily as needed   Proctofoam HC rectal foam Generic drug: hydrocortisone-pramoxine Place rectally.   Semaglutide (1 MG/DOSE) 4 MG/3ML Sopn Inject 1 mg as directed once a week. To replace Trulicity   Vitamin D (Ergocalciferol) 1.25 MG (50000 UNIT) Caps capsule Commonly known as: DRISDOL Take 50,000 Units by mouth every 7 (seven) days.         OBJECTIVE:   Vital Signs: BP 124/82 (BP Location: Left Arm, Patient Position: Sitting, Cuff Size: Normal)   Pulse 85   Ht 4\' 8"  (1.422 m)   Wt 166 lb (75.3 kg)   SpO2 99%   BMI 37.22 kg/m      Exam: General: Pt appears well and is in NAD Right neck shunt palpated   Lungs: Clear with good BS bilat   Heart:  RRR   Extremities: Trace pretibial edema.   Neuro: MS is good with appropriate affect, pt is alert and Ox3   DM Foot Exam 01/04/2024  The skin of the feet is intact without sores or ulcerations. The pedal pulses are 2+ on right and 2+ on left. The sensation is intact to a screening 5.07, 10 gram monofilament bilaterally   DATA REVIEWED:  Lab Results  Component Value Date   HGBA1C 8.5 (A) 08/03/2023   HGBA1C 6.9 (A) 01/30/2023   HGBA1C 7.1 (A) 09/08/2022   Lab Results  Component Value Date   LDLCALC 124 (H) 02/22/2016   CREATININE 0.56 02/10/2021   No results found for: "MICRALBCREAT"   Lab Results  Component Value Date   CHOL 188 02/22/2016   HDL 46 02/22/2016   LDLCALC 124 (H) 02/22/2016   TRIG 89 02/22/2016   CHOLHDL 4.1 02/22/2016         ASSESSMENT / PLAN / RECOMMENDATIONS:   1) Type 2 Diabetes Mellitus, poorly controlled, Without complications - Most recent A1c of 8.5 %. Goal A1c < 7.0 %.    -Per patient she had a recent A1c through PCPs office with an A1c of 9.0% -In reviewing CGM data, the patient has been noted with severe hyperglycemia the first week of January but this eventually has trended down, I suspected missing medication intake, but the patient did not confirm this -I did explain to the patient that if extreme hyperglycemia persist we have no other option be to put on insulin, patient declines insulin -I have recommended switching repaglinide to glipizide as below -Unable to switch Trulicity to Ozempic as Ozempic is cost prohibitive.  Patient   MEDICATIONS:  Stop repaglinide Start glipizide 5 mg twice daily Continue  Metformin 500 mg XR 2 tabs BID  Continue Jardiance 25 mg daily  Continue Trulicity 4.5 mg weekly  EDUCATION / INSTRUCTIONS: BG monitoring instructions: Patient is instructed to check her blood sugars 3 times a day, before meals . Call [facility] if: BG persistently < 70  I reviewed the Rule of 15 for the  treatment of hypoglycemia in detail with the patient. Literature supplied.    2) Diabetic  complications:  Eye: Does not have known diabetic retinopathy.  Neuro/ Feet: Does not have known diabetic peripheral neuropathy .  Renal: Patient does not have known baseline CKD. She   is  on an ACEI/ARB at present.    3) Dyslipidemia:   - Historically LDL above goal  - She was on a statin but she was skeptical about side effects and currently on Zetia, we discussed the cardiovascular benefits of statin  therapy and encouraged her to rethink    F/U in 6 months      Signed electronically by: Lyndle Herrlich, MD  [facility]  [facility] [address] [place] [place], [place] [address] Phone: [phone] FAX: [phone]   CC: Caffie Damme, MD [address] [facility] [place] [address] Phone: [phone]  Fax: [phone]  Return to Endocrinology clinic as below: Future Appointments  Date Time Provider Department Center  01/14/2024  1:45 PM Bonnita Levan, RD [facility]  04/11/2024 11:30 AM Falon Huesca, Konrad Dolores, MD [facility] None

## 2024-01-04 NOTE — Patient Instructions (Signed)
Stop repaglinide Start glipizide 5 mg, 1 tablet before breakfast and 1 tablet before dinner Continue Trulicity weekly  Continue  Metformin 500 mg XR, TWO tablets with Breakfast and TWO tablets with Supper Continue Jardiance 25 mg daily    HOW TO TREAT LOW BLOOD SUGARS (Blood sugar LESS THAN 70 MG/DL) Please follow the RULE OF 15 for the treatment of hypoglycemia treatment (when your (blood sugars are less than 70 mg/dL)   STEP 1: Take 15 grams of carbohydrates when your blood sugar is low, which includes:  3-4 GLUCOSE TABS  OR 3-4 OZ OF JUICE OR REGULAR SODA OR ONE TUBE OF GLUCOSE GEL    STEP 2: RECHECK blood sugar in 15 MINUTES STEP 3: If your blood sugar is still low at the 15 minute recheck --> then, go back to STEP 1 and treat AGAIN with another 15 grams of carbohydrates.

## 2024-01-14 ENCOUNTER — Encounter: Payer: Medicare Other | Attending: Internal Medicine | Admitting: Dietician

## 2024-01-14 ENCOUNTER — Telehealth: Payer: Self-pay | Admitting: Dietician

## 2024-01-14 ENCOUNTER — Encounter: Payer: Self-pay | Admitting: Dietician

## 2024-01-14 DIAGNOSIS — E118 Type 2 diabetes mellitus with unspecified complications: Secondary | ICD-10-CM | POA: Insufficient documentation

## 2024-01-14 NOTE — Patient Instructions (Signed)
Patient Instructions  Consider keeping a journal and include             What you eat             Sensor readings             How you feel             When you take your medicine (opt)   Eat more Non-Starchy Vegetables These include greens, broccoli, cauliflower, cabbage, carrots, beets, eggplant, peppers, squash and others. Minimize added sugars and refined grains Rethink what you drink.  Choose beverages without added sugar.  Look for 0 carbs on the label. See the list of whole grains below.  Find alternatives to usual sweet treats. Choose whole foods over processed. Make simple meals at home more often than eating out.     Tie habits such as walking to something that you enjoy such as listening to a book.  Consider Spindrift, poppi, Olipop, Zevia  Blood glucose goals:  80-130 fasting   100-180 two hours after starting any meal  Generally a rise of 40-60 points after a meal is normal  A1C Goal:   Less than 7%.  Your last A1C was  Lab Results  Component Value Date   HGBA1C 8.5 (A) 08/03/2023

## 2024-01-14 NOTE — Progress Notes (Signed)
Diabetes Self-Management Education  Visit Type: Follow-up  Appt. Start Time: 1405 Appt. End Time: 1435  01/14/2024  [name], identified by name and date of birth, is a 53 y.o. female with a diagnosis of Diabetes:  .   ASSESSMENT  Patient is here today alone.  She was last seen by this RD in 10/2023. Stayed on trulicity rather than switching to Ozempic. A1C 9%.12/24  increased as she ran out of medication due to her husband was in between jobs. She states that she has since been more consistent with her medication and now is accepting of the need for this. Discussed that if she is having financial issues again that impact her medication to contact us or her MD as there are things that can be done to make medication more affordable. Overall glucose better controlled  (GMI 7.2% for the past 2 weeks) but current sensor reading 264 after a [employer] sandwich.  She states that her glucose was 120 prior to lunch. She has joined the [facility] and has been 2 days per week and more active at home with you tube video.   She has been less fatigued.  Patient is here today alone.  She was last seen by this RD on 08/03/2023. She would like to learn how to lower her A1C. States that she struggles to take her medication consistently.  States that this is partially due to not accepting the need for medications and states that she needs to be more committed to this.  All of her diabetes medications have different times to take them (once per week, 30 minutes before meals, and another with meals)   Referral:  Type 2 Diabetes (2007), OSA (on c-pap), HLD, HTN, CHF, bipolar disorder, anemia, vitamin D deficiency Medications include:  Trulicity, Jardiace, metformin, glimipuride, Vitamin D, ezetimibe, irbesartan, Levothyroxine Labs noted to include:  A1C 9% 11/2023 (per patient) increased from, 8.5% 08/03/2023 and 6.9% 01/30/2023 Sleep - improved, takes a nap daily CGM:  FreeStyle Libre 3   CGM Results  from download: 11/12/2023 01/14/2024  % Time CGM active:   43 %   (Goal >70%) 88  Average glucose:   240 mg/dL for 14 days 161W9 days 162 x 14  Glucose management indicator:   9.1 % 7.2%   Time in range (70-180 mg/dL):   22 %   (Goal >60%) 68  Time High (181-250 mg/dL):   29 %   (Goal < 45%) 31  Time Very High (>250 mg/dL):    49 %   (Goal < 5%) 1  Time Low (54-69 mg/dL):   0 %   (Goal <4%) 0  Time Very Low (<54 mg/dL):   0 %   (Goal <0%) 0          167 lbs [id number] 162 lbs 11/12/2023   Patient lives with her husband and 2 adult children.  They share shopping and cooking. She is not currently working.  She retired from [employer] in 2022. She received a nutrition and fitness program certificate in 2008. She does not have motivation to exercise and has not challenged herself to exercise.  She has a Photographer at [employer].    Diabetes Self-Management Education - 01/14/24 1534       Visit Information   Visit Type Follow-up      Psychosocial Assessment   Patient Belief/Attitude about Diabetes Motivated to manage diabetes    What is the hardest part about your diabetes right now, causing you  the most concern, or is the most worrisome to you about your diabetes?   Making healty food and beverage choices    Self-care barriers Lack of material resources    Self-management support Doctor's office;CDE visits    Other persons present Patient    Patient Concerns Nutrition/Meal planning;Healthy Lifestyle;Problem Solving;Glycemic Control;Weight Control    Special Needs None    Preferred Learning Style No preference indicated    Learning Readiness Ready      Pre-Education Assessment   Patient understands the diabetes disease and treatment process. Demonstrates understanding / competency    Patient understands incorporating nutritional management into lifestyle. Comprehends key points    Patient undertands incorporating physical activity into lifestyle. Comprehends key points    Patient  understands using medications safely. Comprehends key points    Patient understands monitoring blood glucose, interpreting and using results Needs Review    Patient understands prevention, detection, and treatment of acute complications. Comprehends key points    Patient understands prevention, detection, and treatment of chronic complications. Compreheands key points    Patient understands how to develop strategies to address psychosocial issues. Comprehends key points    Patient understands how to develop strategies to promote health/change behavior. Needs Review      Complications   Last HgB A1C per patient/outside source 9 %   11/2023 increased from 8.5% 07/2023   How often do you check your blood sugar? > 4 times/day    Fasting Blood glucose range (mg/dL) 45-409    Postprandial Blood glucose range (mg/dL) 811-914;>782    Number of hypoglycemic episodes per month 0    Number of hyperglycemic episodes ( >200mg /dL): Daily      Dietary Intake   Breakfast none today but sometimes eats grits, toast, sausage OR blueberry pancakes with regular syrup    Lunch [employer] chicken fried sandwich    Dinner fish, white rice, mixed vegetables (potatoes, string beans, corn)    Snack (evening) popcorn    Beverage(s) water, regular gingerale 20 oz 3 days per week (stopped other regular soda), occasional homemade gingerale with club soda and small amount of honey, hot water with lemon, tea      Activity / Exercise   Activity / Exercise Type Light (walking / raking leaves)    How many days per week do you exercise? 3    How many minutes per day do you exercise? 45    Total minutes per week of exercise 135      Patient Education   Previous Diabetes Education Yes (please comment)    Healthy Eating Meal options for control of blood glucose level and chronic complications.    Being Active Role of exercise on diabetes management, blood pressure control and cardiac health.    Medications Reviewed patients  medication for diabetes, action, purpose, timing of dose and side effects.    Monitoring Taught/evaluated CGM (comment)    Diabetes Stress and Support Identified and addressed patients feelings and concerns about diabetes;Worked with patient to identify barriers to care and solutions      Individualized Goals (developed by patient)   Nutrition General guidelines for healthy choices and portions discussed    Physical Activity Exercise 5-7 days per week;30 minutes per day    Medications take my medication as prescribed    Monitoring  Consistenly use CGM    Problem Solving Eating Pattern;Addressing barriers to behavior change    Reducing Risk examine blood glucose patterns;do foot checks daily;treat hypoglycemia with 15 grams of carbs  if blood glucose less than 70mg /dL      Patient Self-Evaluation of Goals - Patient rates self as meeting previously set goals (% of time)   Nutrition 50 - 75 % (half of the time)    Physical Activity 50 - 75 % (half of the time)    Medications >75% (most of the time)    Monitoring >75% (most of the time)    Problem Solving and behavior change strategies  50 - 75 % (half of the time)    Reducing Risk (treating acute and chronic complications) 50 - 75 % (half of the time)    Health Coping 50 - 75 % (half of the time)      Post-Education Assessment   Patient understands the diabetes disease and treatment process. Demonstrates understanding / competency    Patient understands incorporating nutritional management into lifestyle. Comprehends key points    Patient undertands incorporating physical activity into lifestyle. Comprehends key points    Patient understands using medications safely. Comphrehends key points    Patient understands monitoring blood glucose, interpreting and using results Comprehends key points    Patient understands prevention, detection, and treatment of acute complications. Comprehends key points    Patient understands prevention, detection,  and treatment of chronic complications. Comprehends key points    Patient understands how to develop strategies to address psychosocial issues. Comprehends key points    Patient understands how to develop strategies to promote health/change behavior. Needs Review      Outcomes   Expected Outcomes Demonstrated interest in learning. Expect positive outcomes    Future DMSE 3-4 months    Program Status Not Completed      Subsequent Visit   Since your last visit have you continued or begun to take your medications as prescribed? Yes    Since your last visit have you had your blood pressure checked? Yes    Since your last visit have you experienced any weight changes? Gain    Weight Gain (lbs) 5    Since your last visit, are you checking your blood glucose at least once a day? Yes             Individualized Plan for Diabetes Self-Management Training:   Learning Objective:  Patient will have a greater understanding of diabetes self-management. Patient education plan is to attend individual and/or group sessions per assessed needs and concerns.   Plan:   Patient Instructions   Patient Instructions  Consider keeping a journal and include             What you eat             Sensor readings             How you feel             When you take your medicine (opt)   Eat more Non-Starchy Vegetables These include greens, broccoli, cauliflower, cabbage, carrots, beets, eggplant, peppers, squash and others. Minimize added sugars and refined grains Rethink what you drink.  Choose beverages without added sugar.  Look for 0 carbs on the label. See the list of whole grains below.  Find alternatives to usual sweet treats. Choose whole foods over processed. Make simple meals at home more often than eating out.     Tie habits such as walking to something that you enjoy such as listening to a book.  Consider Spindrift, poppi, Olipop, Zevia  Blood glucose goals:  80-130 fasting   100-180 two  hours after starting any meal  Generally a rise of 40-60 points after a meal is normal  A1C Goal:   Less than 7%.  Your last A1C was  Lab Results  Component Value Date   HGBA1C 8.5 (A) 08/03/2023        Expected Outcomes:  Demonstrated interest in learning. Expect positive outcomes  Education material provided: Breakfast ideas  If problems or questions, patient to contact team via:  Phone  Future DSME appointment: 3-4 months

## 2024-01-30 ENCOUNTER — Encounter: Payer: Self-pay | Admitting: Internal Medicine

## 2024-01-30 MED ORDER — FLUCONAZOLE 150 MG PO TABS: 150.0000 mg | ORAL_TABLET | Freq: Once | ORAL | 0 refills | Status: AC

## 2024-02-05 ENCOUNTER — Other Ambulatory Visit: Payer: Self-pay

## 2024-02-05 MED ORDER — FREESTYLE LIBRE 3 SENSOR MISC
1.0000 | 3 refills | Status: AC
Start: 2024-02-05 — End: ?

## 2024-02-11 ENCOUNTER — Other Ambulatory Visit: Payer: Self-pay | Admitting: Internal Medicine

## 2024-02-21 MED ORDER — FLUCONAZOLE 150 MG PO TABS: 150.0000 mg | ORAL_TABLET | Freq: Once | ORAL | 1 refills | Status: AC

## 2024-02-21 NOTE — Addendum Note (Signed)
 Addended by: Scarlette Shorts on: 02/21/2024 01:04 PM   Modules accepted: Orders

## 2024-03-03 NOTE — Telephone Encounter (Signed)
 LMx2 to reschedule 04/17/2024 per patient request.

## 2024-03-29 ENCOUNTER — Other Ambulatory Visit: Payer: Self-pay | Admitting: Internal Medicine

## 2024-04-10 ENCOUNTER — Encounter: Payer: Self-pay | Admitting: Dietician

## 2024-04-10 ENCOUNTER — Encounter: Payer: Medicare Other | Attending: Family Medicine | Admitting: Dietician

## 2024-04-10 DIAGNOSIS — E118 Type 2 diabetes mellitus with unspecified complications: Secondary | ICD-10-CM | POA: Insufficient documentation

## 2024-04-10 NOTE — Progress Notes (Signed)
 Diabetes Self-Management Education  Visit Type: Follow-up  Appt. Start Time: 1435 Appt. End Time: 1510  04/18/2024  [name], identified by name and date of birth, is a 53 y.o. female with a diagnosis of Diabetes:  .   ASSESSMENT Patient is here today alone.  She was last seen by this RD on 01/14/2024  She verbalizes a continued need to make changes and states that she has gained weight. Her husband and daughter have been cooking and foods are higher in carbohydrates and calories. She has been doing line dancing once per week. She has been walking twice per week. Still prefers taking Trulicity  rather than change to Ozempic . Her sister is trying to avoid meat and patient states that she is considering eating more vegetable plates. Patients sensor time in range has improved significantly.  11/12/2023 She would like to learn how to lower her A1C. States that she struggles to take her medication consistently.  States that this is partially due to not accepting the need for medications and states that she needs to be more committed to this.  All of her diabetes medications have different times to take them (once per week, 30 minutes before meals, and another with meals)   Referral:  Type 2 Diabetes (2007), OSA (on c-pap), HLD, HTN, CHF, bipolar disorder, anemia, vitamin D deficiency Medications include:  Trulicity , Jardiace, metformin , glimipuride, Vitamin D, ezetimibe, irbesartan , Levothyroxine  Labs noted to include:  A1C 9% 11/2023 (per patient) increased from, 8.5% 08/03/2023 and 6.9% 01/30/2023 Sleep - improved, takes a nap daily CGM:  FreeStyle Libre 3   CGM Results from download: 11/12/2023 01/14/24 04/10/24  % Time CGM active:   43 %   (Goal >70%) 88 95  Average glucose:   240 mg/dL for 14 days 295A2 days 162 x 14 151x14  Glucose management indicator:   9.1 % 7.2%  6.9  Time in range (70-180 mg/dL):   22 %   (Goal >13%) 68 76  Time High (181-250 mg/dL):   29 %   (Goal <  08%) 31 22  Time Very High (>250 mg/dL):    49 %   (Goal < 5%) 1 2  Time Low (54-69 mg/dL):   0 %   (Goal <6%) 0 0  Time Very Low (<54 mg/dL):   0 %   (Goal <5%) 0 0            170 lbs 04/10/2024 167 lbs [id number] 162 lbs 11/12/2023   Patient lives with her husband and 2 adult children.  They share shopping and cooking. She is not currently working.  She retired from [employer] in 2022. She received a nutrition and fitness program certificate in 2008. She does not have motivation to exercise and has not challenged herself to exercise.  She has a Photographer at [employer].    Diabetes Self-Management Education - 04/18/24 1710       Visit Information   Visit Type Follow-up      Psychosocial Assessment   Patient Belief/Attitude about Diabetes Motivated to manage diabetes    What is the hardest part about your diabetes right now, causing you the most concern, or is the most worrisome to you about your diabetes?   Taking/obtaining medications;Making healty food and beverage choices    Self-care barriers None    Self-management support Doctor's office;CDE visits    Other persons present Patient    Patient Concerns Nutrition/Meal planning;Problem Solving;Glycemic Control;Weight Control    Special Needs None  Preferred Learning Style No preference indicated    Learning Readiness Ready    How often do you need to have someone help you when you read instructions, pamphlets, or other written materials from your doctor or pharmacy? 1 - Never      Pre-Education Assessment   Patient understands the diabetes disease and treatment process. Demonstrates understanding / competency    Patient understands incorporating nutritional management into lifestyle. Comprehends key points    Patient undertands incorporating physical activity into lifestyle. Comprehends key points    Patient understands using medications safely. Comprehends key points    Patient understands monitoring blood glucose,  interpreting and using results Comprehends key points    Patient understands prevention, detection, and treatment of acute complications. Comprehends key points    Patient understands prevention, detection, and treatment of chronic complications. Compreheands key points    Patient understands how to develop strategies to address psychosocial issues. Comprehends key points    Patient understands how to develop strategies to promote health/change behavior. Needs Review      Complications   How often do you check your blood sugar? > 4 times/day    Fasting Blood glucose range (mg/dL) 11-914    Postprandial Blood glucose range (mg/dL) 782-956    Number of hypoglycemic episodes per month 0    Number of hyperglycemic episodes ( >200mg /dL): Occasional      Dietary Intake   Breakfast Salad with egg,  bread stick   10 am   Arboriculturist on a bun, potato chips    Snack (evening) Popcorn    Beverage(s) Water, zero Pepsi, occasional regular or zero gingerale      Activity / Exercise   Activity / Exercise Type Light (walking / raking leaves);Moderate (swimming / aerobic walking)    How many days per week do you exercise? 3    How many minutes per day do you exercise? 45    Total minutes per week of exercise 135      Patient Education   Previous Diabetes Education Yes (please comment)   12/2023   Healthy Eating Meal options for control of blood glucose level and chronic complications.    Being Active Role of exercise on diabetes management, blood pressure control and cardiac health.    Medications Reviewed patients medication for diabetes, action, purpose, timing of dose and side effects.    Monitoring Taught/evaluated CGM (comment)    Diabetes Stress and Support Identified and addressed patients feelings and concerns about diabetes;Worked with patient to identify barriers to care and solutions      Individualized Goals (developed by patient)   Nutrition General guidelines for healthy choices and  portions discussed    Physical Activity Exercise 5-7 days per week;30 minutes per day    Medications take my medication as prescribed    Monitoring  Consistenly use CGM      Patient Self-Evaluation of Goals - Patient rates self as meeting previously set goals (% of time)   Nutrition 25 - 50% (sometimes)    Physical Activity 50 - 75 % (half of the time)    Medications >75% (most of the time)    Monitoring >75% (most of the time)    Problem Solving and behavior change strategies  50 - 75 % (half of the time)    Reducing Risk (treating acute and chronic complications) 50 - 75 % (half of the time)    Health Coping 50 - 75 % (half of the time)  Post-Education Assessment   Patient understands the diabetes disease and treatment process. Demonstrates understanding / competency    Patient understands incorporating nutritional management into lifestyle. Comprehends key points    Patient undertands incorporating physical activity into lifestyle. Comprehends key points    Patient understands using medications safely. Comphrehends key points    Patient understands monitoring blood glucose, interpreting and using results Comprehends key points    Patient understands prevention, detection, and treatment of acute complications. Comprehends key points    Patient understands prevention, detection, and treatment of chronic complications. Comprehends key points    Patient understands how to develop strategies to address psychosocial issues. Comprehends key points    Patient understands how to develop strategies to promote health/change behavior. Needs Review      Outcomes   Expected Outcomes Demonstrated interest in learning. Expect positive outcomes    Future DMSE 4-6 wks    Program Status Not Completed      Subsequent Visit   Since your last visit have you continued or begun to take your medications as prescribed? Yes    Since your last visit have you experienced any weight changes? Gain    Weight  Gain (lbs) 3    Since your last visit, are you checking your blood glucose at least once a day? Yes             Individualized Plan for Diabetes Self-Management Training:   Learning Objective:  Patient will have a greater understanding of diabetes self-management. Patient education plan is to attend individual and/or group sessions per assessed needs and concerns.   Plan:   Patient Instructions  Recommend increasing your walking to 30 minutes most day. Increase your vegetable intake  Mindfulness:  Consistently scheduled meal - avoid skipping  Choices  Eat slowly  Away from distraction (sitting in kitchen or dining room)  Stop eating when satisfied  Before a snack ask, "Am I hungry or eating for another reason?"   "What can I do instead if I am not hungry?"  Try to find something every day that brings you joy!   Expected Outcomes:  Demonstrated interest in learning. Expect positive outcomes  Education material provided:   If problems or questions, patient to contact team via:  Phone  Future DSME appointment: 4-6 wks

## 2024-04-10 NOTE — Patient Instructions (Addendum)
 Recommend increasing your walking to 30 minutes most day. Increase your vegetable intake  Mindfulness:  Consistently scheduled meal - avoid skipping  Choices  Eat slowly  Away from distraction (sitting in kitchen or dining room)  Stop eating when satisfied  Before a snack ask, "Am I hungry or eating for another reason?"   "What can I do instead if I am not hungry?"  Try to find something every day that brings you joy!

## 2024-04-11 ENCOUNTER — Ambulatory Visit: Payer: Medicare Other | Admitting: Internal Medicine

## 2024-04-17 ENCOUNTER — Ambulatory Visit: Payer: Medicare Other | Admitting: Internal Medicine

## 2024-04-23 ENCOUNTER — Encounter: Payer: Self-pay | Admitting: Internal Medicine

## 2024-04-23 ENCOUNTER — Ambulatory Visit (INDEPENDENT_AMBULATORY_CARE_PROVIDER_SITE_OTHER): Admitting: Internal Medicine

## 2024-04-23 VITALS — BP 126/80 | HR 78 | Ht <= 58 in | Wt 167.0 lb

## 2024-04-23 DIAGNOSIS — Z7985 Long-term (current) use of injectable non-insulin antidiabetic drugs: Secondary | ICD-10-CM | POA: Diagnosis not present

## 2024-04-23 DIAGNOSIS — Z7984 Long term (current) use of oral hypoglycemic drugs: Secondary | ICD-10-CM | POA: Diagnosis not present

## 2024-04-23 DIAGNOSIS — E1165 Type 2 diabetes mellitus with hyperglycemia: Secondary | ICD-10-CM | POA: Diagnosis not present

## 2024-04-23 LAB — POCT GLYCOSYLATED HEMOGLOBIN (HGB A1C): Hemoglobin A1C: 7.4 % — AB (ref 4.0–5.6)

## 2024-04-23 NOTE — Patient Instructions (Signed)
 Continue  glipizide  5 mg, 1 tablet before breakfast and 1 tablet before dinner Continue Trulicity  4.5 mg weekly  Continue  Metformin  500 mg XR, TWO tablets with Breakfast and TWO tablets with Supper   HOW TO TREAT LOW BLOOD SUGARS (Blood sugar LESS THAN 70 MG/DL) Please follow the RULE OF 15 for the treatment of hypoglycemia treatment (when your (blood sugars are less than 70 mg/dL)   STEP 1: Take 15 grams of carbohydrates when your blood sugar is low, which includes:  3-4 GLUCOSE TABS  OR 3-4 OZ OF JUICE OR REGULAR SODA OR ONE TUBE OF GLUCOSE GEL    STEP 2: RECHECK blood sugar in 15 MINUTES STEP 3: If your blood sugar is still low at the 15 minute recheck --> then, go back to STEP 1 and treat AGAIN with another 15 grams of carbohydrates.

## 2024-04-23 NOTE — Progress Notes (Signed)
 Name: [name]  Age/ Sex: 53 y.o., female   MRN/ DOB: [id number], [birth year]     PCP: Bertrum Brodie, MD   Reason for Endocrinology Evaluation: Type 2 Diabetes Mellitus  Initial Endocrine Consultative Visit: 06/24/2021    PATIENT IDENTIFIER: [name] is a 53 y.o. female with a past medical history of T2DM, dyslipidemia, bipolar d/o, CHF, ventriculoperitoneal shunt . The patient has followed with Endocrinology clinic since 06/24/2021 for consultative assistance with management of her diabetes.  DIABETIC HISTORY:  [name] was diagnosed with DM 2007, she was on insulin  between 2007 and 2010 . Her hemoglobin A1c has ranged from 6.1% in 2016, peaking at 12.4% in 2022.   She was seen by Dr. Washington Hacker from 06/2021 until 03/2022    On her initial visit with me, she had an A1c of 7.1%, she was on bromocriptine , metformin , repaglinide , Trulicity , and Jardiance .  We stopped bromocriptine , increase metformin  and decrease repaglinide , continue Trulicity  and Jardiance   Switched repaglinide  to glipizide  12/2023  Jardiance  has been discontinued due to recurrent genital infections 01/2024  SUBJECTIVE:   During the last visit (01/04/2024): A1c 8.5%      Today (04/23/2024): [name]  She checks her blood sugars multiple  times daily. The patient has not  had hypoglycemic episodes since the last clinic visit.  She has been following up with our CDE She has been following up with orthopedics for left fifth metacarpal bone fracture following a fall  Denies nausea, vomiting  Denies diarrhea or diarrhea    HOME DIABETES REGIMEN:   Trulicity  4.5 mg weekly  Glipizide  5 mg , 1 tablet BID Metformin  500 mg 2 tabs BID     Statin: no ACE-I/ARB: yes Prior Diabetic Education: yes   CONTINUOUS GLUCOSE MONITORING RECORD INTERPRETATION    Dates of Recording: 4/24-04/23/2024  Sensor description:freestyle libre   Results statistics:   CGM use % of time 88  Average and SD 147/24.4  Time  in range 83 %  % Time Above 180 16  % Time above 250 16  % Time Below target 1   Glycemic patterns summary: BGs have been optimal throughout the day and night  Hyperglycemic episodes  postprandial   Hypoglycemic episodes occurred N/A  Overnight periods: Optimal    DIABETIC COMPLICATIONS: Microvascular complications:   Denies: neuropathy, retinopathy ,  Last Eye Exam: Completed 05/2022  Macrovascular complications:   Denies: CAD, CVA, PVD   HISTORY:  Past Medical History:  Past Medical History:  Diagnosis Date   Anemia microcytic 03/31/2013   Bipolar disorder (HCC) 03/31/2013   CHF (congestive heart failure) (HCC)    Diabetes mellitus without complication (HCC)    Hyperlipidemia 03/31/2013   Hypertension    Obesity hypoventilation syndrome (HCC)    Plantar fasciitis of left foot    Pneumonia    Pneumothorax    Pulmonary infiltrates    Rhabdomyolysis 03/31/2013   [facility] secondary to abilify   Vitamin D deficiency 03/31/2013   Past Surgical History:  Past Surgical History:  Procedure Laterality Date   CESAREAN SECTION     x2   CYST EXCISION     DILATATION & CURETTAGE/HYSTEROSCOPY WITH MYOSURE N/A 02/17/2016   Procedure: DILATATION & CURETTAGE/HYSTEROSCOPY/Myomectomy WITH MYOSURE;  Surgeon: Audelia Leaks, MD;  Location: [facility];  Service: Gynecology;  Laterality: N/A;   IR ANGIOGRAM PELVIS SELECTIVE OR SUPRASELECTIVE  03/29/2017   IR ANGIOGRAM PELVIS SELECTIVE OR SUPRASELECTIVE  03/29/2017   IR ANGIOGRAM SELECTIVE EACH ADDITIONAL VESSEL  03/29/2017  IR ANGIOGRAM SELECTIVE EACH ADDITIONAL VESSEL  03/29/2017   IR EMBO TUMOR ORGAN ISCHEMIA INFARCT INC GUIDE ROADMAPPING  03/29/2017   IR GENERIC HISTORICAL  11/14/2016   IR RADIOLOGIST EVAL & MGMT 11/14/2016 Lucinda Saber, MD [facility] INTERV RAD   IR RADIOLOGIST EVAL & MGMT  05/16/2017   IR US  GUIDE VASC ACCESS RIGHT  03/29/2017   MYOMECTOMY N/A 02/17/2016   Procedure: Dionne Frederick WITH Almeda Jacobs;  Surgeon: Audelia Leaks, MD;  Location:  [facility];  Service: Gynecology;  Laterality: N/A;   VENTRICULOPERITONEAL SHUNT     Social History:  reports that she has never smoked. She has never used smokeless tobacco. She reports that she does not drink alcohol and does not use drugs. Family History:  Family History  Problem Relation Age of Onset   Diabetes Mother    Hyperlipidemia Mother    Hypertension Mother    Lung cancer Father    Hypertension Brother      HOME MEDICATIONS: Allergies as of 04/23/2024       Reactions   Abilify [aripiprazole] Other (See Comments)   Reaction:  Hyperglycemia        Medication List        Accurate as of Apr 23, 2024  1:28 PM. If you have any questions, ask your nurse or doctor.          albuterol  108 (90 Base) MCG/ACT inhaler Commonly known as: VENTOLIN  HFA Inhale 2 puffs into the lungs every 6 (six) hours as needed for wheezing or shortness of breath.   albuterol  108 (90 Base) MCG/ACT inhaler Commonly known as: VENTOLIN  HFA INHALE 2 PUFFS INTO THE LUNGS EVERY SIX HOURS AS NEEDED FOR WHEEZING OR SHORTNESS OF BREATH.   amLODipine  10 MG tablet Commonly known as: NORVASC  Take 10 mg by mouth daily.   cyclobenzaprine 5 MG tablet Commonly known as: FLEXERIL Take 5 mg by mouth at bedtime as needed.   diclofenac 75 MG EC tablet Commonly known as: VOLTAREN Take 75 mg by mouth 2 (two) times daily.   divalproex  500 MG 24 hr tablet Commonly known as: DEPAKOTE  ER Take 500 mg by mouth at bedtime.   estradiol 0.1 MG/GM vaginal cream Commonly known as: ESTRACE Place vaginally.   ezetimibe 10 MG tablet Commonly known as: ZETIA Take 10 mg by mouth daily.   FreeStyle Libre 3 Sensor Misc 1 Device by Does not apply route every 14 (fourteen) days.   glipiZIDE  5 MG tablet Commonly known as: GLUCOTROL  Take 1 tablet (5 mg total) by mouth 2 (two) times daily before a meal.   hydrocortisone  2.5 % rectal cream Commonly known as: ANUSOL -HC Apply [id number] Applications  topically 3 (three) times daily.   hydrocortisone  25 MG suppository Commonly known as: ANUSOL -HC Place 1 suppository (25 mg total) rectally 2 (two) times daily.   hydrOXYzine 25 MG tablet Commonly known as: ATARAX Take 25 mg by mouth at bedtime.   irbesartan  300 MG tablet Commonly known as: AVAPRO  Take 300 mg by mouth daily.   lamoTRIgine  200 MG tablet Commonly known as: LAMICTAL  Take 100 mg by mouth every evening.   levothyroxine  50 MCG tablet Commonly known as: SYNTHROID  Take 50 mcg by mouth daily before breakfast.   metFORMIN  500 MG 24 hr tablet Commonly known as: GLUCOPHAGE -XR Take 4 tablets (2,000 mg total) by mouth daily.   nystatin ointment Commonly known as: MYCOSTATIN Apply topically 2 (two) times daily.   OneTouch Delica Lancets 33G Misc Use to check blood sugars 2 times daily as  needed   OneTouch Verio test strip Generic drug: glucose blood Use yo check blood sugar 2 times daily as needed   OneTouch Verio w/Device Kit Use to check blood sugars 2 times daily as needed   Proctofoam HC rectal foam Generic drug: hydrocortisone -pramoxine Place rectally.   Semaglutide  (1 MG/DOSE) 4 MG/3ML Sopn Inject 1 mg as directed once a week. To replace Trulicity    traMADol  50 MG tablet Commonly known as: ULTRAM  Take 1 tablet by mouth.   triamcinolone cream 0.1 % Commonly known as: KENALOG Apply topically 2 (two) times daily.   Trulicity  4.5 MG/0.5ML Soaj Generic drug: Dulaglutide  ADMINISTER 4.5 MG UNDER THE SKIN 1 TIME A WEEK AS DIRECTED   Vitamin D (Ergocalciferol) 1.25 MG (50000 UNIT) Caps capsule Commonly known as: DRISDOL Take 50,000 Units by mouth every 7 (seven) days.         OBJECTIVE:   Vital Signs: BP 126/80 (BP Location: Left Arm, Patient Position: Sitting, Cuff Size: Normal)   Pulse 78   Ht 4\' 8"  (1.422 m)   Wt 167 lb (75.8 kg)   SpO2 98%   BMI 37.44 kg/m   Filed Weights   04/23/24 1323  Weight: 167 lb (75.8 kg)       Exam: General: Pt appears well and is in NAD Right neck shunt palpated   Lungs: Clear with good BS bilat   Heart: RRR   Extremities: No pretibial edema.   Neuro: MS is good with appropriate affect, pt is alert and Ox3   DM Foot Exam 01/04/2024  The skin of the feet is intact without sores or ulcerations. The pedal pulses are 2+ on right and 2+ on left. The sensation is intact to a screening 5.07, 10 gram monofilament bilaterally   DATA REVIEWED:  Lab Results  Component Value Date   HGBA1C 7.4 (A) 04/23/2024   HGBA1C 8.5 (A) 08/03/2023   HGBA1C 6.9 (A) 01/30/2023   Lab Results  Component Value Date   LDLCALC 124 (H) 02/22/2016   CREATININE 0.56 02/10/2021   Lab Results  Component Value Date   MICRALBCREAT 130 12/25/2023     Lab Results  Component Value Date   CHOL 188 02/22/2016   HDL 46 02/22/2016   LDLCALC 124 (H) 02/22/2016   TRIG 89 02/22/2016   CHOLHDL 4.1 02/22/2016         ASSESSMENT / PLAN / RECOMMENDATIONS:   1) Type 2 Diabetes Mellitus, Sub-optimally  controlled, Without complications - Most recent A1c of 7.4 %. Goal A1c < 7.0 %.    -A1c has improved from 8.5% to 7.4% -We had switched repaglinide  to glipizide  due to persistent hyperglycemia -Unable to switch Trulicity  to Ozempic  as Ozempic  is cost prohibitive.  Patient - Encouraged the patient to continue with lifestyle changes, low-carb diet and exercise - Intolerant to Jardiance  due to recurrent genital infections   MEDICATIONS:  Continue Glipizide  5 mg twice daily Continue  Metformin  500 mg XR 2 tabs BID  Continue Trulicity  4.5 mg weekly  EDUCATION / INSTRUCTIONS: BG monitoring instructions: Patient is instructed to check her blood sugars 3 times a day, before meals . Call [place] [facility] if: BG persistently < 70  I reviewed the Rule of 15 for the treatment of hypoglycemia in detail with the patient. Literature supplied.    2) Diabetic complications:  Eye: Does not  have known diabetic retinopathy.  Neuro/ Feet: Does not have known diabetic peripheral neuropathy .  Renal: Patient does not have known baseline CKD.  She   is  on an ACEI/ARB at present.    3) Dyslipidemia:   - Historically LDL above goal  - She was on a statin but she was skeptical about side effects and currently on Zetia, we discussed the cardiovascular benefits of statin  therapy and encouraged her to rethink    F/U in 4 months      Signed electronically by: Natale Bail, MD  [facility] Endocrinology  [facility] [address] [place], [place] [address] Phone: [phone] FAX: [phone]   CC: Bertrum Brodie, MD [address] [facility] [place] [address] Phone: [phone]  Fax: [phone]  Return to Endocrinology clinic as below: Future Appointments  Date Time Provider Department Center  05/22/2024  2:00 PM Tommy Frames Audley Leap, RD [facility]

## 2024-04-24 ENCOUNTER — Encounter: Payer: Self-pay | Admitting: Internal Medicine

## 2024-05-19 ENCOUNTER — Other Ambulatory Visit: Payer: Self-pay

## 2024-05-19 MED ORDER — FREESTYLE LIBRE 3 PLUS SENSOR MISC: 3 refills | Status: AC

## 2024-05-22 ENCOUNTER — Encounter: Attending: Family Medicine | Admitting: Dietician

## 2024-05-22 ENCOUNTER — Encounter: Payer: Self-pay | Admitting: Dietician

## 2024-05-22 DIAGNOSIS — E118 Type 2 diabetes mellitus with unspecified complications: Secondary | ICD-10-CM | POA: Diagnosis present

## 2024-05-22 NOTE — Patient Instructions (Addendum)
 Try [employer] flatbread. Continue to walk or use the [employer].  Aim to do this most days and slowly increase your time to 30 minutes.  Consider going to the [facility] with your daughters.  Continue the [facility]. Consider other options rather than eating out as often with friends.   Snacks should have a protein choice. Drink mostly water  Mindfulness:  Consistently scheduled meal - avoid skipping - Ask, "Have I nourished myself well today."  Choices  Eat slowly  Away from distraction (sitting in kitchen or dining room)  Stop eating when satisfied  Before a snack ask, "Am I hungry or eating for another reason?"   "What can I do instead if I am not hungry?"  Try to find something every day that brings you joy!   Coupon card for Trulicity 

## 2024-05-22 NOTE — Progress Notes (Unsigned)
 Diabetes Self-Management Education  Visit Type: Follow-up  Appt. Start Time: 1420 Appt. End Time: 1450  05/23/2024  [name], identified by name and date of birth, is a 53 y.o. female with a diagnosis of Diabetes:  .   ASSESSMENT Patient is here today alone.  She was last seen by this RD on 04/10/2024.  Patient states that she has gotten off track as her girls are cooking late at night and patient will eat this. Patient states that she was out of the Trulicity  a couple of weeks 2 weeks ago as she was having difficulty affording this.  I went online and signed patient up for the discount card which should be sent to her email. Walking twice per week for 15 minutes   11/12/2023 She would like to learn how to lower her A1C. States that she struggles to take her medication consistently.  States that this is partially due to not accepting the need for medications and states that she needs to be more committed to this.  All of her diabetes medications have different times to take them (once per week, 30 minutes before meals, and another with meals)   Referral:  Type 2 Diabetes (2007), OSA (on c-pap), HLD, HTN, CHF, bipolar disorder, anemia, vitamin D deficiency Medications include:  Trulicity , metformin , glimipuride, ezetimibe, irbesartan , Levothyroxine  Labs noted to include:  A1C 7.4% 04/23/2024 decreased from 9% 11/2023 (per patient), 6.9% 01/30/2023 Sleep - improved, takes a nap daily CGM:  FreeStyle Libre 3  - Sensor reading currently 154 after lunch   CGM Results from download: 11/12/2023 01/14/24 04/10/24 05/22/24  % Time CGM active:   43 %   (Goal >70%) 88 95 97  Average glucose:   240 mg/dL for 14 days 161W9 days 162 x 14 151x14 260x14  Glucose management indicator:   9.1 % 7.2%  6.9 9.6  Time in range (70-180 mg/dL):   22 %   (Goal >60%) 68 76 67  Time High (181-250 mg/dL):   29 %   (Goal < 45%) 31 22 30   Time Very High (>250 mg/dL):    49 %   (Goal < 5%) 1 2 3   Time Low (54-69  mg/dL):   0 %   (Goal <4%) 0 0 0  Time Very Low (<54 mg/dL):   0 %   (Goal <0%) 0 0 0              172 lbs 05/22/2024 170 lbs 04/10/2024 167 lbs [id number] 162 lbs 11/12/2023   Patient lives with her husband and 2 adult children.  They share shopping and cooking. She is not currently working.  She retired from [employer] in 2022. She received a nutrition and fitness program certificate in 2008. She does not have motivation to exercise and has not challenged herself to exercise.  She has a gym    Diabetes Self-Management Education - 05/23/24 1700       Visit Information   Visit Type Follow-up      Psychosocial Assessment   Patient Belief/Attitude about Diabetes Motivated to manage diabetes    What is the hardest part about your diabetes right now, causing you the most concern, or is the most worrisome to you about your diabetes?   Making healty food and beverage choices    Self-care barriers None;Lack of material resources    Self-management support Doctor's office;CDE visits    Other persons present Patient    Patient Concerns Nutrition/Meal planning;Healthy Lifestyle;Weight Control;Glycemic Control  Special Needs None    Preferred Learning Style No preference indicated    Learning Readiness Ready    How often do you need to have someone help you when you read instructions, pamphlets, or other written materials from your doctor or pharmacy? 1 - Never      Pre-Education Assessment   Patient understands the diabetes disease and treatment process. Demonstrates understanding / competency    Patient understands incorporating nutritional management into lifestyle. Needs Review    Patient undertands incorporating physical activity into lifestyle. Needs Review    Patient understands using medications safely. Comprehends key points    Patient understands monitoring blood glucose, interpreting and using results Comprehends key points    Patient understands prevention, detection, and treatment  of acute complications. Comprehends key points    Patient understands prevention, detection, and treatment of chronic complications. Compreheands key points    Patient understands how to develop strategies to address psychosocial issues. Comprehends key points    Patient understands how to develop strategies to promote health/change behavior. Needs Review      Complications   Last HgB A1C per patient/outside source 7.4 %   04/23/24 decreased from 9% 11/2023   How often do you check your blood sugar? > 4 times/day    Fasting Blood glucose range (mg/dL) 16-109;604-540    Postprandial Blood glucose range (mg/dL) 981-191;478-295;>621    Number of hyperglycemic episodes ( >200mg /dL): Daily      Dietary Intake   Breakfast lite yogurt    Snack (morning) none    Lunch [place] sandwich with pickles on flatbread    Snack (afternoon) none today but usual popcorn or cookies    Dinner chicken, dressing, green beans    Snack (evening) french fries    Beverage(s) water, poppi soda, olipop soda, coffee with regular sweet creamer, tea with honey      Activity / Exercise   Activity / Exercise Type Light (walking / raking leaves)    How many days per week do you exercise? 2    How many minutes per day do you exercise? 15    Total minutes per week of exercise 30      Patient Education   Previous Diabetes Education Yes (please comment)    Healthy Eating Meal options for control of blood glucose level and chronic complications.    Being Active Role of exercise on diabetes management, blood pressure control and cardiac health.    Medications Reviewed patients medication for diabetes, action, purpose, timing of dose and side effects.    Monitoring Taught/evaluated CGM (comment)    Diabetes Stress and Support Identified and addressed patients feelings and concerns about diabetes;Worked with patient to identify barriers to care and solutions;Brainstormed with patient on coping mechanisms for social situations,  getting support from significant others, dealing with feelings about diabetes      Individualized Goals (developed by patient)   Nutrition General guidelines for healthy choices and portions discussed    Physical Activity Exercise 5-7 days per week;30 minutes per day    Medications take my medication as prescribed    Monitoring  Consistenly use CGM    Problem Solving Eating Pattern    Reducing Risk examine blood glucose patterns;do foot checks daily;treat hypoglycemia with 15 grams of carbs if blood glucose less than 70mg /dL      Patient Self-Evaluation of Goals - Patient rates self as meeting previously set goals (% of time)   Nutrition 25 - 50% (sometimes)    Physical  Activity 25 - 50% (sometimes)    Medications >75% (most of the time)    Monitoring >75% (most of the time)    Problem Solving and behavior change strategies  50 - 75 % (half of the time)    Reducing Risk (treating acute and chronic complications) 50 - 75 % (half of the time)    Health Coping >75% (most of the time)      Post-Education Assessment   Patient understands the diabetes disease and treatment process. Demonstrates understanding / competency    Patient understands incorporating nutritional management into lifestyle. Needs Review    Patient undertands incorporating physical activity into lifestyle. Comprehends key points    Patient understands using medications safely. Comphrehends key points    Patient understands monitoring blood glucose, interpreting and using results Comprehends key points    Patient understands prevention, detection, and treatment of acute complications. Comprehends key points    Patient understands prevention, detection, and treatment of chronic complications. Comprehends key points    Patient understands how to develop strategies to address psychosocial issues. Comprehends key points    Patient understands how to develop strategies to promote health/change behavior. Needs Review       Outcomes   Expected Outcomes Demonstrated interest in learning but significant barriers to change    Future DMSE 2 months    Program Status Not Completed      Subsequent Visit   Since your last visit have you continued or begun to take your medications as prescribed? Yes    Since your last visit have you experienced any weight changes? Gain    Since your last visit, are you checking your blood glucose at least once a day? Yes             Individualized Plan for Diabetes Self-Management Training:   Learning Objective:  Patient will have a greater understanding of diabetes self-management. Patient education plan is to attend individual and/or group sessions per assessed needs and concerns.   Plan:   Patient Instructions  Try [employer] flatbread. Continue to walk or use the [employer].  Aim to do this most days and slowly increase your time to 30 minutes.  Consider going to the [employer] with your daughters.  Continue the [facility]. Consider other options rather than eating out as often with friends.   Snacks should have a protein choice. Drink mostly water  Mindfulness:  Consistently scheduled meal - avoid skipping - Ask, "Have I nourished myself well today."  Choices  Eat slowly  Away from distraction (sitting in kitchen or dining room)  Stop eating when satisfied  Before a snack ask, "Am I hungry or eating for another reason?"   "What can I do instead if I am not hungry?"  Try to find something every day that brings you joy!   Coupon card for Trulicity   Expected Outcomes:  Demonstrated interest in learning but significant barriers to change  Education material provided:   If problems or questions, patient to contact team via:  Phone  Future DSME appointment: 2 months

## 2024-05-27 ENCOUNTER — Other Ambulatory Visit: Payer: Self-pay | Admitting: Internal Medicine

## 2024-05-27 DIAGNOSIS — E1165 Type 2 diabetes mellitus with hyperglycemia: Secondary | ICD-10-CM

## 2024-07-10 ENCOUNTER — Telehealth: Payer: Self-pay | Admitting: Internal Medicine

## 2024-07-10 ENCOUNTER — Other Ambulatory Visit: Payer: Self-pay

## 2024-07-10 DIAGNOSIS — E1165 Type 2 diabetes mellitus with hyperglycemia: Secondary | ICD-10-CM

## 2024-07-10 MED ORDER — FREESTYLE LIBRE 3 PLUS SENSOR MISC: 1.0000 | Status: AC

## 2024-07-10 NOTE — Telephone Encounter (Signed)
 Patient called and stated that her glucose sensors are not staying on when placed and does not have any left.She reached out to [facility] to file a claim to over ride since it is earlier than her insurance will allow.Patient is wanting to know if we can provide any Freestyle 3 plus sensors.She has been without her sensors for 3 days and cannot locate her glucose meter.Please advise.Her contact info is [phone]

## 2024-07-14 ENCOUNTER — Telehealth: Payer: Self-pay

## 2024-07-14 NOTE — Telephone Encounter (Signed)
 Opened in error

## 2024-08-25 ENCOUNTER — Ambulatory Visit: Admitting: Internal Medicine

## 2024-08-25 ENCOUNTER — Encounter: Payer: Self-pay | Admitting: Internal Medicine

## 2024-08-25 NOTE — Progress Notes (Deleted)
 Name: [name]  Age/ Sex: 53 y.o., female   MRN/ DOB: [id number], [birth year]     PCP: Claudene Round, MD   Reason for Endocrinology Evaluation: Type 2 Diabetes Mellitus  Initial Endocrine Consultative Visit: 06/24/2021    PATIENT IDENTIFIER: [name] is a 53 y.o. female with a past medical history of T2DM, dyslipidemia, bipolar d/o, CHF, ventriculoperitoneal shunt . The patient has followed with Endocrinology clinic since 06/24/2021 for consultative assistance with management of her diabetes.  DIABETIC HISTORY:  Ms. Ruggles was diagnosed with DM 2007, she was on insulin  between 2007 and 2010 . Her hemoglobin A1c has ranged from 6.1% in 2016, peaking at 12.4% in 2022.   She was seen by Dr. Kassie from 06/2021 until 03/2022    On her initial visit with me, she had an A1c of 7.1%, she was on bromocriptine , metformin , repaglinide , Trulicity , and Jardiance .  We stopped bromocriptine , increase metformin  and decrease repaglinide , continue Trulicity  and Jardiance   Switched repaglinide  to glipizide  12/2023  Jardiance  has been discontinued due to recurrent genital infections 01/2024  SUBJECTIVE:   During the last visit (04/23/2024): A1c 7.4%      Today (08/25/2024): Ms. Standen  She checks her blood sugars multiple  times daily. The patient has not  had hypoglycemic episodes since the last clinic visit.  She has been following up with our CDE She has been following up with orthopedics for left fifth metacarpal bone fracture following a fall  Denies nausea, vomiting  Denies diarrhea or diarrhea    HOME DIABETES REGIMEN:   Trulicity  4.5 mg weekly  Glipizide  5 mg , 1 tablet BID Metformin  500 mg 2 tabs BID     Statin: no ACE-I/ARB: yes Prior Diabetic Education: yes   CONTINUOUS GLUCOSE MONITORING RECORD INTERPRETATION    Dates of Recording: 4/24-04/23/2024  Sensor description:freestyle libre   Results statistics:   CGM use % of time 88  Average and SD 147/24.4  Time in  range 83 %  % Time Above 180 16  % Time above 250 16  % Time Below target 1   Glycemic patterns summary: BGs have been optimal throughout the day and night  Hyperglycemic episodes  postprandial   Hypoglycemic episodes occurred N/A  Overnight periods: Optimal    DIABETIC COMPLICATIONS: Microvascular complications:   Denies: neuropathy, retinopathy ,  Last Eye Exam: Completed 05/2022  Macrovascular complications:   Denies: CAD, CVA, PVD   HISTORY:  Past Medical History:  Past Medical History:  Diagnosis Date   Anemia microcytic 03/31/2013   Bipolar disorder (HCC) 03/31/2013   CHF (congestive heart failure) (HCC)    Diabetes mellitus without complication (HCC)    Hyperlipidemia 03/31/2013   Hypertension    Obesity hypoventilation syndrome (HCC)    Plantar fasciitis of left foot    Pneumonia    Pneumothorax    Pulmonary infiltrates    Rhabdomyolysis 03/31/2013   [facility] secondary to abilify   Vitamin D deficiency 03/31/2013   Past Surgical History:  Past Surgical History:  Procedure Laterality Date   CESAREAN SECTION     x2   CYST EXCISION     DILATATION & CURETTAGE/HYSTEROSCOPY WITH MYOSURE N/A 02/17/2016   Procedure: DILATATION & CURETTAGE/HYSTEROSCOPY/Myomectomy WITH MYOSURE;  Surgeon: Burnard Bowers, MD;  Location: [facility];  Service: Gynecology;  Laterality: N/A;   IR ANGIOGRAM PELVIS SELECTIVE OR SUPRASELECTIVE  03/29/2017   IR ANGIOGRAM PELVIS SELECTIVE OR SUPRASELECTIVE  03/29/2017   IR ANGIOGRAM SELECTIVE EACH ADDITIONAL VESSEL  03/29/2017  IR ANGIOGRAM SELECTIVE EACH ADDITIONAL VESSEL  03/29/2017   IR EMBO TUMOR ORGAN ISCHEMIA INFARCT INC GUIDE ROADMAPPING  03/29/2017   IR GENERIC HISTORICAL  11/14/2016   IR RADIOLOGIST EVAL & MGMT 11/14/2016 Ozell Specking, MD [facility] INTERV RAD   IR RADIOLOGIST EVAL & MGMT  05/16/2017   IR US  GUIDE VASC ACCESS RIGHT  03/29/2017   MYOMECTOMY N/A 02/17/2016   Procedure: LEANORA WITH MELINDA;  Surgeon: Burnard Bowers, MD;  Location: [facility];  Service: Gynecology;  Laterality: N/A;   VENTRICULOPERITONEAL SHUNT     Social History:  reports that she has never smoked. She has never used smokeless tobacco. She reports that she does not drink alcohol and does not use drugs. Family History:  Family History  Problem Relation Age of Onset   Diabetes Mother    Hyperlipidemia Mother    Hypertension Mother    Lung cancer Father    Hypertension Brother      HOME MEDICATIONS: Allergies as of 08/25/2024       Reactions   Abilify [aripiprazole] Other (See Comments)   Reaction:  Hyperglycemia        Medication List        Accurate as of August 25, 2024  7:36 AM. If you have any questions, ask your nurse or doctor.          albuterol  108 (90 Base) MCG/ACT inhaler Commonly known as: VENTOLIN  HFA Inhale 2 puffs into the lungs every 6 (six) hours as needed for wheezing or shortness of breath.   albuterol  108 (90 Base) MCG/ACT inhaler Commonly known as: VENTOLIN  HFA INHALE 2 PUFFS INTO THE LUNGS EVERY SIX HOURS AS NEEDED FOR WHEEZING OR SHORTNESS OF BREATH.   amLODipine  10 MG tablet Commonly known as: NORVASC  Take 10 mg by mouth daily.   cyclobenzaprine 5 MG tablet Commonly known as: FLEXERIL Take 5 mg by mouth at bedtime as needed.   diclofenac 75 MG EC tablet Commonly known as: VOLTAREN Take 75 mg by mouth 2 (two) times daily.   divalproex  500 MG 24 hr tablet Commonly known as: DEPAKOTE  ER Take 500 mg by mouth at bedtime.   estradiol 0.1 MG/GM vaginal cream Commonly known as: ESTRACE Place vaginally.   ezetimibe 10 MG tablet Commonly known as: ZETIA Take 10 mg by mouth daily.   FreeStyle Libre 3 Sensor Misc 1 Device by Does not apply route every 14 (fourteen) days.   FreeStyle Libre 3 Plus Sensor Misc Change sensor every 15 days.   FreeStyle Libre 3 Plus Sensor Misc Inject 1 Device into the skin continuous. Change every 15 days   glipiZIDE  5 MG tablet Commonly known as: GLUCOTROL  Take 1  tablet (5 mg total) by mouth 2 (two) times daily before a meal.   hydrocortisone  2.5 % rectal cream Commonly known as: ANUSOL -HC Apply [id number] Applications topically 3 (three) times daily.   hydrocortisone  25 MG suppository Commonly known as: ANUSOL -HC Place 1 suppository (25 mg total) rectally 2 (two) times daily.   hydrOXYzine 25 MG tablet Commonly known as: ATARAX Take 25 mg by mouth at bedtime.   irbesartan  300 MG tablet Commonly known as: AVAPRO  Take 300 mg by mouth daily.   lamoTRIgine  200 MG tablet Commonly known as: LAMICTAL  Take 100 mg by mouth every evening.   levothyroxine  50 MCG tablet Commonly known as: SYNTHROID  Take 50 mcg by mouth daily before breakfast.   metFORMIN  500 MG 24 hr tablet Commonly known as: GLUCOPHAGE -XR Take 4 tablets (2,000 mg total) by  mouth daily.   nystatin ointment Commonly known as: MYCOSTATIN Apply topically 2 (two) times daily.   OneTouch Delica Lancets 33G Misc Use to check blood sugars 2 times daily as needed   OneTouch Verio test strip Generic drug: glucose blood Use yo check blood sugar 2 times daily as needed   OneTouch Verio w/Device Kit Use to check blood sugars 2 times daily as needed   Proctofoam HC rectal foam Generic drug: hydrocortisone -pramoxine Place rectally.   traMADol  50 MG tablet Commonly known as: ULTRAM  Take 1 tablet by mouth.   triamcinolone cream 0.1 % Commonly known as: KENALOG Apply topically 2 (two) times daily.   Trulicity  4.5 MG/0.5ML Soaj Generic drug: Dulaglutide  ADMINISTER 4.5 MG UNDER THE SKIN 1 TIME A WEEK AS DIRECTED   Vitamin D (Ergocalciferol) 1.25 MG (50000 UNIT) Caps capsule Commonly known as: DRISDOL Take 50,000 Units by mouth every 7 (seven) days.         OBJECTIVE:   Vital Signs: There were no vitals taken for this visit.  There were no vitals filed for this visit.     Exam: General: Pt appears well and is in NAD Right neck shunt palpated    Lungs: Clear with good BS bilat   Heart: RRR   Extremities: No pretibial edema.   Neuro: MS is good with appropriate affect, pt is alert and Ox3   DM Foot Exam 01/04/2024  The skin of the feet is intact without sores or ulcerations. The pedal pulses are 2+ on right and 2+ on left. The sensation is intact to a screening 5.07, 10 gram monofilament bilaterally   DATA REVIEWED:  Lab Results  Component Value Date   HGBA1C 7.4 (A) 04/23/2024   HGBA1C 8.5 (A) 08/03/2023   HGBA1C 6.9 (A) 01/30/2023   Lab Results  Component Value Date   LDLCALC 124 (H) 02/22/2016   CREATININE 0.56 02/10/2021   Lab Results  Component Value Date   MICRALBCREAT 130 12/25/2023     Lab Results  Component Value Date   CHOL 188 02/22/2016   HDL 46 02/22/2016   LDLCALC 124 (H) 02/22/2016   TRIG 89 02/22/2016   CHOLHDL 4.1 02/22/2016         ASSESSMENT / PLAN / RECOMMENDATIONS:   1) Type 2 Diabetes Mellitus, Sub-optimally  controlled, Without complications - Most recent A1c of 7.4 %. Goal A1c < 7.0 %.    -A1c has improved from 8.5% to 7.4% -We had switched repaglinide  to glipizide  due to persistent hyperglycemia -Unable to switch Trulicity  to Ozempic  as Ozempic  is cost prohibitive.  Patient - Encouraged the patient to continue with lifestyle changes, low-carb diet and exercise - Intolerant to Jardiance  due to recurrent genital infections   MEDICATIONS:  Continue Glipizide  5 mg twice daily Continue  Metformin  500 mg XR 2 tabs BID  Continue Trulicity  4.5 mg weekly  EDUCATION / INSTRUCTIONS: BG monitoring instructions: Patient is instructed to check her blood sugars 3 times a day, before meals . Call [place] [facility] [facility] if: BG persistently < 70  I reviewed the Rule of 15 for the treatment of hypoglycemia in detail with the patient. Literature supplied.    2) Diabetic complications:  Eye: Does not have known diabetic retinopathy.  Neuro/ Feet: Does not have known diabetic  peripheral neuropathy .  Renal: Patient does not have known baseline CKD. She   is  on an ACEI/ARB at present.    3) Dyslipidemia:   - Historically LDL above goal  -  She was on a statin but she was skeptical about side effects and currently on Zetia, we discussed the cardiovascular benefits of statin  therapy and encouraged her to rethink    F/U in 4 months      Signed electronically by: Stefano Redgie Butts, MD  [facility]  [facility] [address] [place] [place], [place] [address] Phone: [phone] FAX: [phone]   CC: Claudene Round, MD [address] [facility] [place] [address] Phone: [phone]  Fax: [phone]  Return to Endocrinology clinic as below: Future Appointments  Date Time Provider Department Center  08/25/2024  1:20 PM Renny Remer, Donell Redgie, MD [facility] None  09/05/2024 11:00 AM Knox Leita CROME, RD [facility] NDM

## 2024-09-05 ENCOUNTER — Encounter: Attending: Internal Medicine | Admitting: Dietician

## 2024-09-05 ENCOUNTER — Encounter: Payer: Self-pay | Admitting: Dietician

## 2024-09-05 VITALS — Wt 167.0 lb

## 2024-09-05 DIAGNOSIS — E118 Type 2 diabetes mellitus with unspecified complications: Secondary | ICD-10-CM | POA: Insufficient documentation

## 2024-09-05 NOTE — Patient Instructions (Signed)
 Patient Instructions  Try Joseph's flatbread. Continue to walk or use the [employer].  Aim to do this most days and slowly increase your time to 30 minutes.  Consider going to the [facility] with your daughters.  Continue the [facility]. Consider other options rather than eating out as often with friends.  CALORIE KING APP Snacks should have a protein choice. Drink mostly water   Mindfulness:             Consistently scheduled meal - avoid skipping - Ask, Have I nourished myself well today.             Choices             Eat slowly             Away from distraction (sitting in kitchen or dining room)             Stop eating when satisfied             Before a snack ask, Am I hungry or eating for another reason?                         What can I do instead if I am not hungry?   Try to find something every day that brings you joy!

## 2024-09-05 NOTE — Progress Notes (Signed)
 Diabetes Self-Management Education  Visit Type: Follow-up  Appt. Start Time: 1122 Appt. End Time: 1152  09/05/2024  [name], identified by name and date of birth, is a 53 y.o. female with a diagnosis of Diabetes:  .   ASSESSMENT  Patient is here today alone.  She was last seen by this RD on 05/22/2024.    She states that she had an A1C >8.5% and this scared her.  She stopped night snacking most of the time, eating less, reduced fast foods, working on reducing fried foods.  She does have an air fryer. Intermittent fasting with eating window from 12-9:30 pm.   Trying to eat more vegetables. She continues to struggle with regular soda.  States she needs to change but not ready. Loves sweets and daughters cook these.  Husband likes fried foods with meals that have increased carbs.  Patient states that she has gotten off track as her girls are cooking late at night and patient will eat this. Patient states that she was out of the Trulicity  a couple of weeks 2 weeks ago as she was having difficulty affording this.  I went online and signed patient up for the discount card which should be sent to her email. Walking twice per week for 15 minutes   11/12/2023 She would like to learn how to lower her A1C. States that she struggles to take her medication consistently.  States that this is partially due to not accepting the need for medications and states that she needs to be more committed to this.  All of her diabetes medications have different times to take them (once per week, 30 minutes before meals, and another with meals)   Referral:  Type 2 Diabetes (2007), OSA (on c-pap), HLD, HTN, CHF, bipolar disorder, anemia, vitamin D deficiency Medications include:  Trulicity , metformin , glimipuride, ezetimibe, irbesartan , Levothyroxine  Labs noted to include:  A1C 7.4% 04/23/2024 decreased from 9% 11/2023 (per patient), 6.9% 01/30/2023 Sleep - improved, takes a nap daily CGM:  FreeStyle  Libre 3  - Fasting sensor reading 149 lbs this am and usually <135.  116 now.   CGM Results from download: 11/12/23 01/14/24 04/10/24 05/22/24 08/1924  % Time CGM active:   43 %   (Goal >70%) 88 95 97 92  Average glucose:   240 mg/dL for 14 days 846K2 days 162 x 14 151x14 260x14 151x14  Glucose management indicator:   9.1 % 7.2%  6.9 9.6 6.9  Time in range (70-180 mg/dL):   22 %   (Goal >29%) 68 76 67 82  Time High (181-250 mg/dL):   29 %   (Goal < 74%) 31 22 30 15   Time Very High (>250 mg/dL):    49 %   (Goal < 5%) 1 2 3 3   Time Low (54-69 mg/dL):   0 %   (Goal <5%) 0 0 0 0  Time Very Low (<54 mg/dL):   0 %   (Goal <8%) 0 0 0 0                167 lbs 09/05/2024 172 lbs 05/22/2024 170 lbs 04/10/2024 167 lbs [id number] 162 lbs 11/12/2023   Patient lives with her husband and 2 adult children.  They share shopping and cooking. She is not currently working.  She retired from [employer] in 2022.  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 She received a nutrition and fitness program certificate in 2008. She does  not have motivation to exercise and has not challenged herself to exercise.   She exercises with You [employer]ube She has a treadmill and weights.  She goes to the gym occasionally.  Walks about 1/2 mile per day.  Weight 167 lb (75.8 kg). Body mass index is 37.44 kg/m.   Diabetes Self-Management Education - 09/05/24 1511       Visit Information   Visit Type Follow-up      Health Coping   How would you rate your overall health? Good      Psychosocial Assessment   Patient Belief/Attitude about Diabetes Motivated to manage diabetes    What is the hardest part about your diabetes right now, causing you the most concern, or is the most worrisome to you about your diabetes?   Making healty food and beverage choices;Being active    Self-care barriers None    Self-management support Doctor's office;CDE visits    Other persons present Patient    Patient Concerns  Nutrition/Meal planning;Glycemic Control;Weight Control;Healthy Lifestyle;Problem Solving    Special Needs None    Preferred Learning Style No preference indicated    Learning Readiness Ready    How often do you need to have someone help you when you read instructions, pamphlets, or other written materials from your doctor or pharmacy? 1 - Never      Pre-Education Assessment   Patient understands the diabetes disease and treatment process. Demonstrates understanding / competency    Patient understands incorporating nutritional management into lifestyle. Needs Review    Patient undertands incorporating physical activity into lifestyle. Needs Review    Patient understands using medications safely. Comprehends key points    Patient understands monitoring blood glucose, interpreting and using results Comprehends key points    Patient understands prevention, detection, and treatment of acute complications. Comprehends key points    Patient understands prevention, detection, and treatment of chronic complications. Compreheands key points    Patient understands how to develop strategies to address psychosocial issues. Comprehends key points    Patient understands how to develop strategies to promote health/change behavior. Needs Review      Complications   Number of hypoglycemic episodes per month 7.4   04/23/2024 decreased from 9% 11/2023   Can you tell when your blood sugar is low? Yes      Dietary Intake   Breakfast fasting    Snack (morning) fasting    Lunch cashews, alsmonds, raw vegetables    Snack (afternoon) popcorn    Dinner [employer] sub, apple turnover, regular Dr. Nunzio    Beverage(s) water, regular Dr. Nunzio, diet soda, occasional juice, unsweetened hot tea      Activity / Exercise   Activity / Exercise Type Light (walking / raking leaves)    How many days per week do you exercise? 7    How many minutes per day do you exercise? 15    Total minutes per week of exercise 105       Patient Education   Previous Diabetes Education Yes   05/22/2024   Healthy Eating Role of diet in the treatment of diabetes and the relationship between the three main macronutrients and blood glucose level;Information on hints to eating out and maintain blood glucose control.;Meal options for control of blood glucose level and chronic complications.;Reviewed blood glucose goals for pre and post meals and how to evaluate the patients' food intake on their blood glucose level.    Being Active Role of exercise on diabetes management, blood pressure control and  cardiac health.    Medications Reviewed patients medication for diabetes, action, purpose, timing of dose and side effects.    Monitoring Taught/evaluated CGM (comment)    Diabetes Stress and Support Identified and addressed patients feelings and concerns about diabetes;Worked with patient to identify barriers to care and solutions      Individualized Goals (developed by patient)   Nutrition General guidelines for healthy choices and portions discussed    Physical Activity Exercise 5-7 days per week;30 minutes per day    Medications take my medication as prescribed    Problem Solving Eating Pattern;Addressing barriers to behavior change    Reducing Risk examine blood glucose patterns;do foot checks daily;treat hypoglycemia with 15 grams of carbs if blood glucose less than 70mg /dL      Patient Self-Evaluation of Goals - Patient rates self as meeting previously set goals (% of time)   Nutrition 50 - 75 % (half of the time)    Physical Activity 50 - 75 % (half of the time)    Medications >75% (most of the time)    Monitoring >75% (most of the time)    Problem Solving and behavior change strategies  50 - 75 % (half of the time)    Reducing Risk (treating acute and chronic complications) 50 - 75 % (half of the time)    Health Coping >75% (most of the time)      Post-Education Assessment   Patient understands the diabetes disease and treatment  process. Demonstrates understanding / competency    Patient understands incorporating nutritional management into lifestyle. Needs Review    Patient undertands incorporating physical activity into lifestyle. Comprehends key points    Patient understands using medications safely. Comphrehends key points    Patient understands monitoring blood glucose, interpreting and using results Comprehends key points    Patient understands prevention, detection, and treatment of acute complications. Comprehends key points    Patient understands prevention, detection, and treatment of chronic complications. Comprehends key points    Patient understands how to develop strategies to address psychosocial issues. Comprehends key points    Patient understands how to develop strategies to promote health/change behavior. Needs Review      Outcomes   Expected Outcomes Demonstrated interest in learning. Expect positive outcomes    Future DMSE 2 months    Program Status Not Completed      Subsequent Visit   Since your last visit have you continued or begun to take your medications as prescribed? Yes    Since your last visit have you experienced any weight changes? Loss    Weight Loss (lbs) 5          Individualized Plan for Diabetes Self-Management Training:   Learning Objective:  Patient will have a greater understanding of diabetes self-management. Patient education plan is to attend individual and/or group sessions per assessed needs and concerns.   Plan:   Patient Instructions   Patient Instructions  Try [employer]'s flatbread. Continue to walk or use the [employer].  Aim to do this most days and slowly increase your time to 30 minutes.  Consider going to the [facility] with your daughters.  Continue the [facility]. Consider other options rather than eating out as often with friends.  CALORIE KING APP Snacks should have a protein choice. Drink mostly water   Mindfulness:             Consistently scheduled meal  - avoid skipping - Ask, Have I nourished myself well today.  Choices             Eat slowly             Away from distraction (sitting in kitchen or dining room)             Stop eating when satisfied             Before a snack ask, Am I hungry or eating for another reason?                         What can I do instead if I am not hungry?   Try to find something every day that brings you joy!      Expected Outcomes:  Demonstrated interest in learning. Expect positive outcomes  Education material provided:   If problems or questions, patient to contact team via:  Phone  Future DSME appointment: 2 months

## 2024-10-31 ENCOUNTER — Ambulatory Visit: Admitting: Dietician

## 2024-11-27 ENCOUNTER — Encounter: Attending: Internal Medicine | Admitting: Dietician

## 2024-11-27 ENCOUNTER — Encounter: Admitting: Dietician

## 2024-11-27 ENCOUNTER — Encounter: Payer: Self-pay | Admitting: Dietician

## 2024-11-27 VITALS — Wt 167.0 lb

## 2024-11-27 DIAGNOSIS — E118 Type 2 diabetes mellitus with unspecified complications: Secondary | ICD-10-CM | POA: Insufficient documentation

## 2024-11-27 NOTE — Patient Instructions (Addendum)
 Continue to walk. Consider some youtube exercises such as Chair yoga or Chair Cardio  Consider shifting your eating to the daylight hours most often.  Consider 10 am - 7 pm Choose carbohydrates with nutrition such as fresh or frozen fruit, beans, whole grains, sweet potato. Carbohydrates provide us  energy but balance throughout the day.  Use the  Calorie King app to help make healthier choices when eating out OR make more mindful choices that are not fried.  All meals and snacks should have protein (fish, chicken, lean beef, beans, greek yogurt, eggs, cheese, nuts)  Drink mostly water   Mindfulness:             Consistently scheduled meal - avoid skipping - Ask, Have I nourished myself well today.             Choices             Eat slowly             Away from distraction (sitting in kitchen or dining room)             Stop eating when satisfied             Before a snack ask, Am I hungry or eating for another reason?                         What can I do instead if I am not hungry?

## 2024-11-27 NOTE — Progress Notes (Signed)
 Diabetes Self-Management Education  Visit Type: Follow-up  Appt. Start Time: 1045 Appt. End Time: 1115  11/27/2024  [name], identified by name and date of birth, is a 53 y.o. female with a diagnosis of Diabetes:  .   ASSESSMENT Patient is here today alone.  She was last seen by this RD on 09/05/2024.  She is still intermittent fasting 12-9:30. Increased late night eating since her daughters are home. Continues to use the [employer] 30 minutes 3 times per week but overall less as since she has started work. 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 Continues to eat out frequently.  She has downloaded the Calorie King app but hasn't used this.   11/12/2023 She would like to learn how to lower her A1C. States that she struggles to take her medication consistently.  States that this is partially due to not accepting the need for medications and states that she needs to be more committed to this.  All of her diabetes medications have different times to take them (once per week, 30 minutes before meals, and another with meals)   Referral:  Type 2 Diabetes (2007), OSA (on c-pap), HLD, HTN, CHF, bipolar disorder, anemia, vitamin D deficiency Medications include:  Trulicity , metformin , glimipuride, ezetimibe, irbesartan , Levothyroxine  Labs noted to include:  A1C 7.2% 10/2024 (per patient), 7.4% 04/23/2024 decreased from 9% 11/2023 (per patient), 6.9% 01/30/2023 Sleep - improved, takes a nap daily CGM:  FreeStyle Libre 3  - Fasting sensor reading 149 lbs this am and usually <135.  116 now.   CGM Results from download: 11/12/23 01/14/24 04/10/24 05/22/24 08/1924 11/27/24  % Time CGM active:   43 %   (Goal >70%) 88 95 97 92   Average glucose:   240 mg/dL for 14 days 846K2 days 162 x 14 151x14 260x14 151x14 165x14  Glucose management indicator:   9.1 % 7.2%  6.9 9.6 6.9 7.3  Time in range (70-180 mg/dL):   22 %   (Goal >29%) 68 76 67 82 69  Time High (181-250 mg/dL):   29 %    (Goal < 74%) 31 22 30 15 23   Time Very High (>250 mg/dL):    49 %   (Goal < 5%) 1 2 3 3 8   Time Low (54-69 mg/dL):   0 %   (Goal <5%) 0 0 0 0 0  Time Very Low (<54 mg/dL):   0 %   (Goal <8%) 0 0 0 0 0                  167 lbs 11/27/2024 167 lbs 09/05/2024 172 lbs 05/22/2024 170 lbs 04/10/2024 167 lbs [id number] 162 lbs 11/12/2023   Patient lives with her husband and 2 adult children.  They share shopping and cooking. She is not currently working.  She retired from [employer] in 2022.  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 She received a nutrition and fitness program certificate in 2008. She does not have motivation to exercise and has not challenged herself to exercise.   She exercises with [employer] She has a treadmill and weights.  She goes to the gym occasionally.  Walks about 1/2 mile per day.  Weight 167 lb (75.8 kg). Body mass index is 37.44 kg/m.   Diabetes Self-Management Education - 11/27/24 1200       Visit Information   Visit Type Follow-up      Health Coping  How would you rate your overall health? Good      Psychosocial Assessment   Patient Belief/Attitude about Diabetes Other (comment)    What is the hardest part about your diabetes right now, causing you the most concern, or is the most worrisome to you about your diabetes?   Making healty food and beverage choices    Self-care barriers None    Self-management support Doctor's office;CDE visits    Other persons present Patient    Patient Concerns Nutrition/Meal planning    Special Needs None    Preferred Learning Style No preference indicated    Learning Readiness Ready    How often do you need to have someone help you when you read instructions, pamphlets, or other written materials from your doctor or pharmacy? 1 - Never      Pre-Education Assessment   Patient understands the diabetes disease and treatment process. Demonstrates understanding / competency    Patient understands  incorporating nutritional management into lifestyle. Needs Review    Patient undertands incorporating physical activity into lifestyle. Needs Review    Patient understands using medications safely. Comprehends key points    Patient understands monitoring blood glucose, interpreting and using results Comprehends key points    Patient understands prevention, detection, and treatment of acute complications. Comprehends key points    Patient understands prevention, detection, and treatment of chronic complications. Compreheands key points    Patient understands how to develop strategies to address psychosocial issues. Comprehends key points    Patient understands how to develop strategies to promote health/change behavior. Needs Review      Complications   Last HgB A1C per patient/outside source 7.2 %   10/2024 per patient   How often do you check your blood sugar? > 4 times/day    Fasting Blood glucose range (mg/dL) 869-820    Postprandial Blood glucose range (mg/dL) 819-799;>799    Number of hypoglycemic episodes per month 0      Dietary Intake   Breakfast 1 pancake with regular syrup    Beverage(s) hot water with lemon, turmeric tea, water, occasional regular gingerale (3-4 times per week), zero soda, coffee with regular creamer      Activity / Exercise   Activity / Exercise Type Light (walking / raking leaves)    How many days per week do you exercise? 3    How many minutes per day do you exercise? 30    Total minutes per week of exercise 90      Patient Education   Previous Diabetes Education Yes    Healthy Eating Role of diet in the treatment of diabetes and the relationship between the three main macronutrients and blood glucose level;Meal options for control of blood glucose level and chronic complications.;Reviewed blood glucose goals for pre and post meals and how to evaluate the patients' food intake on their blood glucose level.    Being Active Role of exercise on diabetes  management, blood pressure control and cardiac health.    Medications Reviewed patients medication for diabetes, action, purpose, timing of dose and side effects.    Monitoring Taught/evaluated CGM (comment)    Diabetes Stress and Support Identified and addressed patients feelings and concerns about diabetes;Worked with patient to identify barriers to care and solutions      Individualized Goals (developed by patient)   Nutrition General guidelines for healthy choices and portions discussed    Physical Activity Exercise 5-7 days per week;30 minutes per day    Medications take  my medication as prescribed    Monitoring  Consistenly use CGM    Problem Solving Eating Pattern;Addressing barriers to behavior change;Social situations    Reducing Risk examine blood glucose patterns;do foot checks daily;treat hypoglycemia with 15 grams of carbs if blood glucose less than 70mg /dL      Patient Self-Evaluation of Goals - Patient rates self as meeting previously set goals (% of time)   Nutrition 50 - 75 % (half of the time)    Physical Activity 50 - 75 % (half of the time)    Medications 50 - 75 % (half of the time)    Monitoring >75% (most of the time)    Problem Solving and behavior change strategies  50 - 75 % (half of the time)    Reducing Risk (treating acute and chronic complications) 50 - 75 % (half of the time)    Health Coping >75% (most of the time)      Post-Education Assessment   Patient understands the diabetes disease and treatment process. Comprehends key points    Patient understands incorporating nutritional management into lifestyle. Needs Review    Patient undertands incorporating physical activity into lifestyle. Comprehends key points    Patient understands using medications safely. Comphrehends key points    Patient understands monitoring blood glucose, interpreting and using results Comprehends key points    Patient understands prevention, detection, and treatment of acute  complications. Comprehends key points    Patient understands prevention, detection, and treatment of chronic complications. Comprehends key points    Patient understands how to develop strategies to address psychosocial issues. Comprehends key points    Patient understands how to develop strategies to promote health/change behavior. Needs Review      Outcomes   Expected Outcomes Demonstrated interest in learning. Expect positive outcomes    Future DMSE 3-4 months    Program Status Not Completed      Subsequent Visit   Since your last visit have you continued or begun to take your medications as prescribed? Yes    Since your last visit have you experienced any weight changes? No change          Individualized Plan for Diabetes Self-Management Training:   Learning Objective:  Patient will have a greater understanding of diabetes self-management. Patient education plan is to attend individual and/or group sessions per assessed needs and concerns.   Plan:   Patient Instructions  Continue to walk. Consider some youtube exercises such as Chair yoga or Chair Cardio  Consider shifting your eating to the daylight hours most often.  Consider 10 am - 7 pm Choose carbohydrates with nutrition such as fresh or frozen fruit, beans, whole grains, sweet potato. Carbohydrates provide us  energy but balance throughout the day.  Use the  [employer] app to help make healthier choices when eating out OR make more mindful choices that are not fried.  All meals and snacks should have protein (fish, chicken, lean beef, beans, greek yogurt, eggs, cheese, nuts)  Drink mostly water   Mindfulness:             Consistently scheduled meal - avoid skipping - Ask, Have I nourished myself well today.             Choices             Eat slowly             Away from distraction (sitting in kitchen or dining room)  Stop eating when satisfied             Before a snack ask, Am I hungry or  eating for another reason?                         What can I do instead if I am not hungry?   Expected Outcomes:  Demonstrated interest in learning. Expect positive outcomes  Education material provided:   If problems or questions, patient to contact team via:  Phone  Future DSME appointment: 3-4 months

## 2025-01-15 ENCOUNTER — Encounter: Admitting: Dietician

## 2025-02-05 ENCOUNTER — Encounter: Admitting: Dietician
